# Patient Record
Sex: Female | Born: 1945 | Race: Black or African American | Hispanic: No | Marital: Married | Smoking: Never smoker
Health system: Southern US, Community
[De-identification: ages and names within clinical notes are randomized; demographics above are authoritative.]

## PROBLEM LIST (undated history)

## (undated) DIAGNOSIS — E039 Hypothyroidism, unspecified: Secondary | ICD-10-CM

## (undated) DIAGNOSIS — E7800 Pure hypercholesterolemia, unspecified: Secondary | ICD-10-CM

## (undated) DIAGNOSIS — I739 Peripheral vascular disease, unspecified: Secondary | ICD-10-CM

## (undated) DIAGNOSIS — E559 Vitamin D deficiency, unspecified: Secondary | ICD-10-CM

## (undated) DIAGNOSIS — G4733 Obstructive sleep apnea (adult) (pediatric): Secondary | ICD-10-CM

## (undated) DIAGNOSIS — E1122 Type 2 diabetes mellitus with diabetic chronic kidney disease: Secondary | ICD-10-CM

## (undated) DIAGNOSIS — I129 Hypertensive chronic kidney disease with stage 1 through stage 4 chronic kidney disease, or unspecified chronic kidney disease: Secondary | ICD-10-CM

## (undated) DIAGNOSIS — I10 Essential (primary) hypertension: Secondary | ICD-10-CM

## (undated) DIAGNOSIS — E669 Obesity, unspecified: Secondary | ICD-10-CM

## (undated) DIAGNOSIS — Z9989 Dependence on other enabling machines and devices: Secondary | ICD-10-CM

## (undated) DIAGNOSIS — N182 Chronic kidney disease, stage 2 (mild): Secondary | ICD-10-CM

## (undated) HISTORY — PX: DILATION AND CURETTAGE OF UTERUS: SHX78

## (undated) HISTORY — DX: Hypothyroidism, unspecified: E03.9

## (undated) HISTORY — DX: Obesity, unspecified: E66.9

## (undated) HISTORY — DX: Vitamin D deficiency, unspecified: E55.9

## (undated) HISTORY — DX: Hypertensive chronic kidney disease with stage 1 through stage 4 chronic kidney disease, or unspecified chronic kidney disease: I12.9

## (undated) HISTORY — DX: Dependence on other enabling machines and devices: Z99.89

## (undated) HISTORY — DX: Pure hypercholesterolemia, unspecified: E78.00

## (undated) HISTORY — DX: Obstructive sleep apnea (adult) (pediatric): G47.33

## (undated) HISTORY — DX: Essential (primary) hypertension: I10

## (undated) HISTORY — DX: Chronic kidney disease, stage 2 (mild): E11.22

## (undated) HISTORY — PX: THYROIDECTOMY: SHX17

## (undated) HISTORY — DX: Peripheral vascular disease, unspecified: I73.9

## (undated) HISTORY — PX: BREAST CYST EXCISION: SHX579

## (undated) HISTORY — PX: ABDOMINAL HYSTERECTOMY: SHX81

## (undated) HISTORY — DX: Type 2 diabetes mellitus with diabetic chronic kidney disease: N18.2

## (undated) HISTORY — PX: OTHER SURGICAL HISTORY: SHX169

---

## 1997-06-15 ENCOUNTER — Ambulatory Visit (HOSPITAL_COMMUNITY): Admission: RE | Admit: 1997-06-15 | Discharge: 1997-06-15 | Payer: Self-pay | Admitting: *Deleted

## 1997-08-11 ENCOUNTER — Ambulatory Visit (HOSPITAL_COMMUNITY): Admission: RE | Admit: 1997-08-11 | Discharge: 1997-08-11 | Payer: Self-pay | Admitting: *Deleted

## 1998-04-12 ENCOUNTER — Inpatient Hospital Stay (HOSPITAL_COMMUNITY): Admission: AD | Admit: 1998-04-12 | Discharge: 1998-04-14 | Payer: Self-pay | Admitting: Cardiovascular Disease

## 1998-04-13 ENCOUNTER — Encounter: Payer: Self-pay | Admitting: Cardiovascular Disease

## 1998-07-22 ENCOUNTER — Ambulatory Visit (HOSPITAL_COMMUNITY): Admission: RE | Admit: 1998-07-22 | Discharge: 1998-07-22 | Payer: Self-pay | Admitting: *Deleted

## 1998-09-02 ENCOUNTER — Other Ambulatory Visit: Admission: RE | Admit: 1998-09-02 | Discharge: 1998-09-02 | Payer: Self-pay | Admitting: Obstetrics and Gynecology

## 1998-09-23 ENCOUNTER — Ambulatory Visit (HOSPITAL_COMMUNITY): Admission: RE | Admit: 1998-09-23 | Discharge: 1998-09-23 | Payer: Self-pay | Admitting: Obstetrics and Gynecology

## 1998-10-17 ENCOUNTER — Encounter: Admission: RE | Admit: 1998-10-17 | Discharge: 1999-01-15 | Payer: Self-pay | Admitting: Endocrinology

## 1999-05-06 ENCOUNTER — Emergency Department (HOSPITAL_COMMUNITY): Admission: EM | Admit: 1999-05-06 | Discharge: 1999-05-06 | Payer: Self-pay | Admitting: Emergency Medicine

## 1999-05-09 ENCOUNTER — Encounter: Payer: Self-pay | Admitting: *Deleted

## 1999-05-09 ENCOUNTER — Ambulatory Visit (HOSPITAL_COMMUNITY): Admission: RE | Admit: 1999-05-09 | Discharge: 1999-05-09 | Payer: Self-pay | Admitting: *Deleted

## 1999-07-13 ENCOUNTER — Encounter: Admission: RE | Admit: 1999-07-13 | Discharge: 1999-10-11 | Payer: Self-pay | Admitting: Endocrinology

## 1999-08-11 ENCOUNTER — Ambulatory Visit (HOSPITAL_COMMUNITY): Admission: RE | Admit: 1999-08-11 | Discharge: 1999-08-12 | Payer: Self-pay | Admitting: Cardiovascular Disease

## 1999-08-11 ENCOUNTER — Encounter: Payer: Self-pay | Admitting: Cardiovascular Disease

## 1999-08-25 ENCOUNTER — Ambulatory Visit (HOSPITAL_COMMUNITY): Admission: RE | Admit: 1999-08-25 | Discharge: 1999-08-25 | Payer: Self-pay | Admitting: *Deleted

## 1999-10-20 ENCOUNTER — Encounter: Payer: Self-pay | Admitting: Obstetrics and Gynecology

## 1999-10-20 ENCOUNTER — Ambulatory Visit (HOSPITAL_COMMUNITY): Admission: RE | Admit: 1999-10-20 | Discharge: 1999-10-20 | Payer: Self-pay | Admitting: Obstetrics and Gynecology

## 1999-12-01 ENCOUNTER — Other Ambulatory Visit: Admission: RE | Admit: 1999-12-01 | Discharge: 1999-12-01 | Payer: Self-pay | Admitting: Obstetrics and Gynecology

## 2000-02-20 ENCOUNTER — Ambulatory Visit (HOSPITAL_COMMUNITY): Admission: RE | Admit: 2000-02-20 | Discharge: 2000-02-20 | Payer: Self-pay | Admitting: *Deleted

## 2000-02-20 ENCOUNTER — Encounter: Payer: Self-pay | Admitting: *Deleted

## 2000-05-21 ENCOUNTER — Inpatient Hospital Stay (HOSPITAL_COMMUNITY): Admission: EM | Admit: 2000-05-21 | Discharge: 2000-05-23 | Payer: Self-pay | Admitting: Emergency Medicine

## 2000-05-23 ENCOUNTER — Encounter: Payer: Self-pay | Admitting: Cardiovascular Disease

## 2000-07-05 ENCOUNTER — Ambulatory Visit (HOSPITAL_COMMUNITY): Admission: RE | Admit: 2000-07-05 | Discharge: 2000-07-05 | Payer: Self-pay | Admitting: *Deleted

## 2000-11-14 ENCOUNTER — Encounter: Payer: Self-pay | Admitting: Obstetrics and Gynecology

## 2000-11-14 ENCOUNTER — Ambulatory Visit (HOSPITAL_COMMUNITY): Admission: RE | Admit: 2000-11-14 | Discharge: 2000-11-14 | Payer: Self-pay | Admitting: Obstetrics and Gynecology

## 2001-01-08 ENCOUNTER — Ambulatory Visit (HOSPITAL_COMMUNITY): Admission: RE | Admit: 2001-01-08 | Discharge: 2001-01-08 | Payer: Self-pay | Admitting: Cardiovascular Disease

## 2001-01-08 ENCOUNTER — Encounter: Payer: Self-pay | Admitting: Cardiovascular Disease

## 2001-01-21 ENCOUNTER — Encounter: Admission: RE | Admit: 2001-01-21 | Discharge: 2001-01-21 | Payer: Self-pay | Admitting: Cardiovascular Disease

## 2001-01-21 ENCOUNTER — Encounter: Payer: Self-pay | Admitting: Cardiovascular Disease

## 2001-05-13 ENCOUNTER — Encounter: Payer: Self-pay | Admitting: *Deleted

## 2001-05-13 ENCOUNTER — Ambulatory Visit (HOSPITAL_COMMUNITY): Admission: RE | Admit: 2001-05-13 | Discharge: 2001-05-13 | Payer: Self-pay | Admitting: *Deleted

## 2001-05-26 ENCOUNTER — Encounter: Payer: Self-pay | Admitting: Cardiovascular Disease

## 2001-05-26 ENCOUNTER — Ambulatory Visit (HOSPITAL_COMMUNITY): Admission: RE | Admit: 2001-05-26 | Discharge: 2001-05-26 | Payer: Self-pay | Admitting: Cardiovascular Disease

## 2001-06-04 ENCOUNTER — Encounter: Payer: Self-pay | Admitting: *Deleted

## 2001-06-04 ENCOUNTER — Ambulatory Visit (HOSPITAL_COMMUNITY): Admission: RE | Admit: 2001-06-04 | Discharge: 2001-06-04 | Payer: Self-pay | Admitting: *Deleted

## 2001-06-19 ENCOUNTER — Encounter: Admission: RE | Admit: 2001-06-19 | Discharge: 2001-07-24 | Payer: Self-pay | Admitting: Neurosurgery

## 2001-10-10 ENCOUNTER — Encounter: Admission: RE | Admit: 2001-10-10 | Discharge: 2001-10-10 | Payer: Self-pay | Admitting: Endocrinology

## 2001-10-10 ENCOUNTER — Encounter: Payer: Self-pay | Admitting: Endocrinology

## 2001-10-27 ENCOUNTER — Ambulatory Visit (HOSPITAL_COMMUNITY): Admission: RE | Admit: 2001-10-27 | Discharge: 2001-10-27 | Payer: Self-pay | Admitting: *Deleted

## 2001-10-27 ENCOUNTER — Encounter: Payer: Self-pay | Admitting: *Deleted

## 2001-12-05 ENCOUNTER — Ambulatory Visit (HOSPITAL_COMMUNITY): Admission: RE | Admit: 2001-12-05 | Discharge: 2001-12-05 | Payer: Self-pay | Admitting: Obstetrics and Gynecology

## 2001-12-05 ENCOUNTER — Encounter: Payer: Self-pay | Admitting: Obstetrics and Gynecology

## 2001-12-18 ENCOUNTER — Ambulatory Visit (HOSPITAL_COMMUNITY): Admission: RE | Admit: 2001-12-18 | Discharge: 2001-12-18 | Payer: Self-pay | Admitting: *Deleted

## 2001-12-18 ENCOUNTER — Encounter: Payer: Self-pay | Admitting: *Deleted

## 2002-05-29 ENCOUNTER — Inpatient Hospital Stay (HOSPITAL_COMMUNITY): Admission: EM | Admit: 2002-05-29 | Discharge: 2002-06-02 | Payer: Self-pay | Admitting: Emergency Medicine

## 2002-05-29 ENCOUNTER — Encounter: Payer: Self-pay | Admitting: Emergency Medicine

## 2002-05-29 ENCOUNTER — Encounter: Payer: Self-pay | Admitting: Orthopedic Surgery

## 2002-05-30 ENCOUNTER — Encounter: Payer: Self-pay | Admitting: Orthopedic Surgery

## 2002-08-14 ENCOUNTER — Encounter: Payer: Self-pay | Admitting: Orthopedic Surgery

## 2002-08-19 ENCOUNTER — Ambulatory Visit (HOSPITAL_COMMUNITY): Admission: RE | Admit: 2002-08-19 | Discharge: 2002-08-21 | Payer: Self-pay | Admitting: Orthopedic Surgery

## 2002-09-14 ENCOUNTER — Encounter: Admission: RE | Admit: 2002-09-14 | Discharge: 2002-12-13 | Payer: Self-pay | Admitting: Orthopedic Surgery

## 2002-10-14 ENCOUNTER — Encounter: Admission: RE | Admit: 2002-10-14 | Discharge: 2002-12-17 | Payer: Self-pay | Admitting: Orthopedic Surgery

## 2002-12-07 ENCOUNTER — Ambulatory Visit (HOSPITAL_COMMUNITY): Admission: RE | Admit: 2002-12-07 | Discharge: 2002-12-07 | Payer: Self-pay | Admitting: *Deleted

## 2002-12-07 ENCOUNTER — Encounter: Payer: Self-pay | Admitting: *Deleted

## 2003-05-13 ENCOUNTER — Encounter: Admission: RE | Admit: 2003-05-13 | Discharge: 2003-07-15 | Payer: Self-pay | Admitting: Orthopedic Surgery

## 2003-08-18 ENCOUNTER — Ambulatory Visit (HOSPITAL_COMMUNITY): Admission: RE | Admit: 2003-08-18 | Discharge: 2003-08-18 | Payer: Self-pay | Admitting: *Deleted

## 2003-09-08 ENCOUNTER — Ambulatory Visit (HOSPITAL_COMMUNITY): Admission: RE | Admit: 2003-09-08 | Discharge: 2003-09-08 | Payer: Self-pay | Admitting: *Deleted

## 2003-10-08 ENCOUNTER — Ambulatory Visit (HOSPITAL_COMMUNITY): Admission: RE | Admit: 2003-10-08 | Discharge: 2003-10-08 | Payer: Self-pay | Admitting: Cardiovascular Disease

## 2003-12-30 ENCOUNTER — Ambulatory Visit (HOSPITAL_COMMUNITY): Admission: RE | Admit: 2003-12-30 | Discharge: 2003-12-30 | Payer: Self-pay | Admitting: Internal Medicine

## 2004-01-01 ENCOUNTER — Inpatient Hospital Stay (HOSPITAL_COMMUNITY): Admission: EM | Admit: 2004-01-01 | Discharge: 2004-01-03 | Payer: Self-pay | Admitting: Emergency Medicine

## 2004-11-15 ENCOUNTER — Emergency Department (HOSPITAL_COMMUNITY): Admission: EM | Admit: 2004-11-15 | Discharge: 2004-11-15 | Payer: Self-pay | Admitting: *Deleted

## 2005-01-31 ENCOUNTER — Ambulatory Visit (HOSPITAL_COMMUNITY): Admission: RE | Admit: 2005-01-31 | Discharge: 2005-01-31 | Payer: Self-pay | Admitting: Internal Medicine

## 2005-03-15 ENCOUNTER — Observation Stay (HOSPITAL_COMMUNITY): Admission: AD | Admit: 2005-03-15 | Discharge: 2005-03-16 | Payer: Self-pay | Admitting: Cardiovascular Disease

## 2005-09-03 ENCOUNTER — Encounter: Admission: RE | Admit: 2005-09-03 | Discharge: 2005-09-03 | Payer: Self-pay | Admitting: Internal Medicine

## 2005-09-07 ENCOUNTER — Encounter: Admission: RE | Admit: 2005-09-07 | Discharge: 2005-09-07 | Payer: Self-pay | Admitting: Hematology and Oncology

## 2005-10-29 ENCOUNTER — Encounter: Admission: RE | Admit: 2005-10-29 | Discharge: 2005-10-29 | Payer: Self-pay | Admitting: Cardiovascular Disease

## 2005-11-29 ENCOUNTER — Ambulatory Visit (HOSPITAL_COMMUNITY): Admission: RE | Admit: 2005-11-29 | Discharge: 2005-11-29 | Payer: Self-pay | Admitting: Internal Medicine

## 2006-01-01 ENCOUNTER — Other Ambulatory Visit: Admission: RE | Admit: 2006-01-01 | Discharge: 2006-01-01 | Payer: Self-pay | Admitting: Obstetrics and Gynecology

## 2006-09-17 ENCOUNTER — Inpatient Hospital Stay (HOSPITAL_COMMUNITY): Admission: AD | Admit: 2006-09-17 | Discharge: 2006-09-19 | Payer: Self-pay | Admitting: Cardiovascular Disease

## 2006-10-21 ENCOUNTER — Encounter: Admission: RE | Admit: 2006-10-21 | Discharge: 2006-10-21 | Payer: Self-pay | Admitting: Cardiovascular Disease

## 2006-11-24 ENCOUNTER — Encounter: Admission: RE | Admit: 2006-11-24 | Discharge: 2006-11-24 | Payer: Self-pay | Admitting: Cardiovascular Disease

## 2006-12-17 ENCOUNTER — Ambulatory Visit (HOSPITAL_COMMUNITY): Admission: RE | Admit: 2006-12-17 | Discharge: 2006-12-17 | Payer: Self-pay | Admitting: Neurosurgery

## 2007-01-08 ENCOUNTER — Ambulatory Visit (HOSPITAL_COMMUNITY): Admission: RE | Admit: 2007-01-08 | Discharge: 2007-01-08 | Payer: Self-pay | Admitting: Obstetrics and Gynecology

## 2007-02-06 ENCOUNTER — Emergency Department (HOSPITAL_COMMUNITY): Admission: EM | Admit: 2007-02-06 | Discharge: 2007-02-06 | Payer: Self-pay | Admitting: Emergency Medicine

## 2007-12-23 ENCOUNTER — Ambulatory Visit (HOSPITAL_COMMUNITY): Admission: RE | Admit: 2007-12-23 | Discharge: 2007-12-23 | Payer: Self-pay | Admitting: Anesthesiology

## 2008-01-19 ENCOUNTER — Encounter: Admission: RE | Admit: 2008-01-19 | Discharge: 2008-01-19 | Payer: Self-pay | Admitting: Neurosurgery

## 2008-04-22 ENCOUNTER — Encounter: Admission: RE | Admit: 2008-04-22 | Discharge: 2008-04-22 | Payer: Self-pay | Admitting: Orthopedic Surgery

## 2008-05-11 ENCOUNTER — Encounter: Admission: RE | Admit: 2008-05-11 | Discharge: 2008-05-11 | Payer: Self-pay | Admitting: Orthopedic Surgery

## 2008-06-03 ENCOUNTER — Ambulatory Visit (HOSPITAL_COMMUNITY): Admission: RE | Admit: 2008-06-03 | Discharge: 2008-06-04 | Payer: Self-pay | Admitting: Orthopedic Surgery

## 2008-07-01 ENCOUNTER — Encounter: Admission: RE | Admit: 2008-07-01 | Discharge: 2008-08-25 | Payer: Self-pay | Admitting: Orthopedic Surgery

## 2008-12-02 ENCOUNTER — Encounter: Admission: RE | Admit: 2008-12-02 | Discharge: 2008-12-02 | Payer: Self-pay | Admitting: Anesthesiology

## 2009-01-13 ENCOUNTER — Encounter: Admission: RE | Admit: 2009-01-13 | Discharge: 2009-01-13 | Payer: Self-pay | Admitting: Orthopedic Surgery

## 2009-10-03 ENCOUNTER — Ambulatory Visit (HOSPITAL_COMMUNITY): Admission: RE | Admit: 2009-10-03 | Discharge: 2009-10-03 | Payer: Self-pay | Admitting: Internal Medicine

## 2010-04-16 ENCOUNTER — Encounter: Payer: Self-pay | Admitting: Internal Medicine

## 2010-04-16 ENCOUNTER — Encounter: Payer: Self-pay | Admitting: Neurosurgery

## 2010-07-06 LAB — COMPREHENSIVE METABOLIC PANEL
ALT: 36 U/L — ABNORMAL HIGH (ref 0–35)
AST: 46 U/L — ABNORMAL HIGH (ref 0–37)
Calcium: 9.9 mg/dL (ref 8.4–10.5)
Creatinine, Ser: 0.78 mg/dL (ref 0.4–1.2)
GFR calc Af Amer: >60 mL/min (ref >60)
Sodium: 143 mEq/L (ref 135–145)
Total Protein: 6.3 g/dL (ref 6.0–8.3)

## 2010-07-06 LAB — URINE MICROSCOPIC-ADD ON

## 2010-07-06 LAB — GLUCOSE, CAPILLARY
Glucose-Capillary: 140 mg/dL — ABNORMAL HIGH (ref 70–99)
Glucose-Capillary: 158 mg/dL — ABNORMAL HIGH (ref 70–99)
Glucose-Capillary: 247 mg/dL — ABNORMAL HIGH (ref 70–99)

## 2010-07-06 LAB — CBC
MCHC: 34.5 g/dL (ref 30.0–36.0)
Platelets: 253 10*3/uL (ref 150–400)
RDW: 14.2 % (ref 11.5–15.5)

## 2010-07-06 LAB — HEMOGLOBIN A1C
Hgb A1c MFr Bld: 6.3 % — ABNORMAL HIGH (ref 4.6–6.1)
Mean Plasma Glucose: 134 mg/dL

## 2010-07-06 LAB — URINALYSIS, ROUTINE W REFLEX MICROSCOPIC
Glucose, UA: NEGATIVE mg/dL
Ketones, ur: NEGATIVE mg/dL
Specific Gravity, Urine: 1.026 (ref 1.005–1.030)
pH: 6.5 (ref 5.0–8.0)

## 2010-08-08 NOTE — Op Note (Signed)
NAMEKRISTAIN, [name]                ACCOUNT NO.:  [id number]   MEDICAL RECORD NO.:  [id number]          PATIENT TYPE:  OIB   LOCATION:  5005                         FACILITY:  [facility]   PHYSICIAN:  Myrtie Neither, MD      DATE OF BIRTH:  [birth year]   DATE OF PROCEDURE:  06/03/2008  DATE OF DISCHARGE:                               OPERATIVE REPORT   PREOPERATIVE DIAGNOSES:  1. Impingement syndrome, left shoulder.  2. Possible cuff rotator cuff tear.   POSTOPERATIVE DIAGNOSIS:  Impingement syndrome tendopathy, left rotator  cuff.   ANESTHESIA:  General.   PROCEDURE:  Arthroscopic acromioplasty and complete synovectomy, left  shoulder.  The patient was taken to the operating room.  After giving  adequate medications, given general anesthesia, and intubated.  Left  shoulder was prepped with DuraPrep and draped in sterile manner.  The  patient was placed in barber chair position.  An one-half inch puncture  wound made along the posterior aspect of left shoulder going through  skin and subcutaneous tissue.  Suture wire was placed from posterior to  anterior, anterior incision was then made for inflow of water.  Separate  lateral incision was made for the shaver.  Inspection of the joint  revealed hypertrophic overgrowth of subacromial bursal sac,  chondromalacia changes of the subacromial surface.  Sharp dip in the  anterior lateral aspect of the acromion.  Inspection of the cuff after  complete synovectomy was done, demonstrate no complete tear with  tendopathy involving the rotator cuff.  Acromioplasty was then done  after adequate acromioplasty.  Further inspection did not reveal any  other further pathology.  Copious and abundant irrigation was done.  Wound closure was then done with 4-0 nylon.  Compressive dressing was  applied.  Immobilizing sling was applied.  The patient tolerated  procedure quite well, went to the recovery room in stable and  satisfactory condition.      Myrtie Neither, MD  Electronically Signed     AC/MEDQ  D:  06/03/2008  T:  06/03/2008  Job:  [id number]

## 2010-08-08 NOTE — H&P (Signed)
[name], [name]                ACCOUNT NO.:  [id number]   MEDICAL RECORD NO.:  [id number]          PATIENT TYPE:  INP   LOCATION:  4739                         FACILITY:  [facility]   PHYSICIAN:  Ricki Rodriguez, M.D.  DATE OF BIRTH:  Apr 24, 1945   DATE OF ADMISSION:  09/17/2006  DATE OF DISCHARGE:                              HISTORY & PHYSICAL   CHIEF COMPLAINT:  Chest pain.   HISTORY OF PRESENT ILLNESS:  A 65 year old black female described  retrosternal pressure type chest pain radiating to her left arm which  felt as heaviness.  The patient had a classmate who died of sudden heart  attack 1 week ago.   PAST MEDICAL HISTORY:  1. Positive for diabetes for 13 years.  2. Hypertension for 15 years.  3. Hyperlipidemia.  4. Obesity.  5. Known history of coronary artery disease.  6. Negative history of regular exercise.   The patient denies history of smoking, alcohol intake, or drug intake.   PAST SURGICAL HISTORY:  1. Thyroid surgery 1979.  2. Right breast benign cyst removal 1980.  3. Hysterectomy and appendectomy 1988.  4. Right shoulder and ankle surgery 2004.  5. Cardiac catheterization 2005 showing minimal coronary artery      disease.   MEDICATIONS:  1. Prevacid 30 mg one daily.  2. Detrol LA 4 mg daily.  3. Eskalith 450 mg one daily.  4. Seroquel 100 mg one at bedtime.  5. Enteric-coated aspirin 325 mg daily.  6. Synthroid 0.15 mg daily.  7. Diovan 320 mg daily.  8. Lantus 20 units at bedtime.  9. Zocor 40 mg one daily.  10.Imdur 60 mg one daily.  11.KCl 10 mEq daily.  12.Ferrous sulfate 325 mg one daily.  13.Gabapentin 200 mg three times daily.  14.Lasix 20 mg one daily.  15.Nitroglycerin 0.4 mg tablet one sublingual every 5 minutes x3 as      needed for chest pain.  16.Calcium 500 mg one daily.  17.Tramadol 50 mg one three times daily.  18.XALATAN eye drops 1 drop each eye at bedtime.   ALLERGIES:  1. CRESTOR.  2. TOPROL.  3. LYRICA.   SOCIAL  HISTORY:  The patient is married.  Husband, [name], is 19 years  old.  The patient has 3 daughters and 1 adopted son.   FAMILY HISTORY:  Father died of brain problems in his 33s.  The patient  has 2 brothers and 2 sisters and they are living and well.   REVIEW OF SYSTEMS:  The patient denies recent weight gain.  Wears  glasses.  No dentures.  No history of asthma.  Positive history of chest  pain.  Positive history of dizziness.  No palpitations.  No history of  leg edema.  Positive history of hiatal hernia.  Negative history of GI  bleed, blood transfusion, hepatitis, kidney stones, strokes, seizures.  Positive history of psychiatric admissions for manic depression.  History of pneumonia shot 2004.  Flu shot 8 months ago.   PHYSICAL EXAMINATION:  VITAL SIGNS:  Pulse 50, respirations 18, blood  pressure 140/60, height 5 feet 4  inches, weight approximately 216  pounds.  GENERAL:  The patient is alert and oriented x3.  HEENT:  The patient is normocephalic, atraumatic.  Has brown eyes.  Conjunctivae pink.  Sclerae white.  NECK:  No JVD.  No carotid bruits.  LUNGS:  Clear bilaterally.  CHEST WALL:  Nontender on palpation.  HEART:  Normal S1, S2 with grade 3/6 systolic murmur in her left sternal  border.  ABDOMEN:  Soft, distended.  EXTREMITIES:  Trace edema.  CNS:  Cranial nerves grossly intact.  The patient as bilateral equal  grips.   LABORATORY DATA:  Pending.   EKG:  Sinus bradycardia.   IMPRESSION:  1. Chest pain.  Rule out myocardial infarction.  2. Coronary artery disease.  3. Hypertension.  4. Hypothyroidism.  5. Diabetes mellitus type 2.   PLAN:  Plan is to admit patient on telemetry floor.  Continue home  medications.  Get serial cardiac enzymes to rule out MI.  Consider  nuclear stress test followed by cardiac catheterization if needed.      Ricki Rodriguez, M.D.  Electronically Signed     ASK/MEDQ  D:  09/17/2006  T:  09/18/2006  Job:  [id number]

## 2010-08-08 NOTE — Discharge Summary (Signed)
[name], [name]                ACCOUNT NO.:  [id number]   MEDICAL RECORD NO.:  [id number]          PATIENT TYPE:  INP   LOCATION:  4739                         FACILITY:  [facility]   PHYSICIAN:  Ricki Rodriguez, M.D.  DATE OF BIRTH:  1945/08/19   DATE OF ADMISSION:  09/17/2006  DATE OF DISCHARGE:  09/19/2006                               DISCHARGE SUMMARY   FINAL DIAGNOSES:  1. Chest pain.  2. Diabetes mellitus type 2.  3. Hypertension.  4. Hyperlipidemia.  5. Obesity.  6. Coronary atherosclerosis of native coronary vessel.  7. Diaphragmatic hernia.  8. Hypothyroidism.   DISCHARGE MEDICATIONS:  1. Prevacid 30 mg one daily.  2. Detrol LA 4 mg one daily.  3. Eskalith 450 mg one daily.  4. Seroquel 100 mg one at bedtime.  5. Synthroid 0.15 mg one daily.  6. Diovan 320 mg one daily.  7. Enteric-coated aspirin 325 mg one daily.  8. Lantus 20 units at bedtime.  9. Zocor 40 mg one daily.  10.Imdur 60 mg one daily.  11.KCl 10 mEq daily.  12.Ferrous sulfate 25 mg one daily.  13.Gabapentin 200 mg one three times daily.  14.Lasix 20 mg one daily.  15.Tylenol 15 mg one three times daily.  16.Nitroglycerin 0.4 mg one tablet one sublingual every 5 minutes x3      as needed for chest pain.  17.Calcium 500 mg one or two daily.  18.Xalatan eye drops 1 drop each eye at bedtime.   DISCHARGE DIET:  Low sodium, heart healthy, medium calorie, carbohydrate-  modified diet.   DISCHARGE ACTIVITY:  The patient to increase activity slowly.   SPECIAL INSTRUCTIONS:  The patient to stop any activity that causes  chest pain, shortness of breath, dizziness, sweating or excessive  weakness.   FOLLOWUP:  Follow-up by Dr. Orpah Cobb in two weeks.  The patient to  call [phone] for appointment.   HISTORY OF PRESENT ILLNESS:  This 65 year old black female presented  with sudden onset of chest pain radiating to the left arm with cardiac  risk factors of diabetes for 13 years, hypertension for 15  years,  hyperlipidemia.   PHYSICAL EXAMINATION:  VITAL SIGNS:  Pulse 50, respirations 80, blood  pressure 140/60,  height 5 feet 4 inches, weight approximately 216  pounds.  GENERAL:  Patient is alert, oriented x3, and in no significant distress.  HEENT: The patient is normocephalic, atraumatic.  Has brown eyes.  Conjunctivae pink.  Sclerae are white.  NECK:  No JVD, no carotid bruit.  LUNGS:  Clear bilaterally.  CHEST:  Nontender on palpation.  HEART:  Normal S1, S2 with grade 3/6 systolic murmur at the left sternal  border.  ABDOMEN:  Soft, nondistended.  EXTREMITIES:  With trace edema.  CNS:  Cranial nerves grossly intact.  Patient has bilateral equal grips.   LABORATORY DATA:  Revealed normal hemoglobin/hematocrit, WBC count,  platelet count.  Normal PT/INR, normal electrolytes, BUN, creatinine,  sugar borderline at 104.  Liver enzymes near normal with borderline AST  40.  Cardiac enzymes and troponin I normal.  Nuclear stress test showed  ejection fraction 72% and normal wall motion without reversible  ischemia.  Chest x-ray:  Cardiomegaly and mild vascular congestion.  EKG  revealed sinus bradycardia.   HOSPITAL COURSE:  The patient was admitted to telemetry unit.  Myocardial infarction was ruled out.  She underwent nuclear stress test  that failed to show any reversible ischemia.  The patient's medications  were adjusted, and she was discharged home in satisfactory condition  with a follow-up by me in two weeks.      Ricki Rodriguez, M.D.  Electronically Signed     ASK/MEDQ  D:  01/01/2007  T:  01/02/2007  Job:  [id number]

## 2010-08-11 NOTE — Cardiovascular Report (Signed)
[place]  Patient:    [name], [name]                       MRN: [id number] Proc. Date: 08/11/99 Adm. Date:  [id number] Disc. Date: [id number] Attending:  Ricki Rodriguez CC:         Sharyn Dross., M.D.                        Cardiac Catheterization  CINE NO. [id number]  REFERRING PHYSICIAN:  Sharyn Dross., M.D.  PROCEDURE DONE BY:  Ricki Rodriguez, M.D.  PROCEDURES: 1. Left heart catheterization. 2. Selective coronary angiography. 3. Left ventricular function study. 4. Percutaneous transluminal coronary angioplasty and stent placement in    obtuse marginal branch #2 off left circumflex coronary artery.  INDICATIONS:  This 65 year old black female with symptoms of angina and abnormal stress test has a known history of coronary artery disease.  Todays cardiac catheterization showed a 70% obtuse marginal branch #2 lesion with somewhat unstable or eccentric plaque.  COMPLICATIONS:  None.  HEMODYNAMIC DATA:  The left ventricular pressure was 157/23.  The aortic pressure was 157/74.  ANGIOGRAPHIC DATA:  Left ventriculogram:  The left ventriculogram showed normal left ventricular systolic function with ejection fraction of 70%.  Coronary anatomy: 1. The left main coronary artery was unremarkable. 2. The left anterior descending artery coronary artery had an ostial 10%    stenosis.  The rest of the vessel was unremarkable. 3. The left circumflex coronary artery had minimal proximal luminal    irregularity and mid vessel 20% stenosis.  Its obtuse marginal branch #1    was very small and obtuse marginal branch #2 was a large vessel with a 70%    proximal lesion in the superior branch. 4. The right coronary artery was a small vessel.  PTCA STENT PLACEMENT IN OBTUSE MARGINAL BRANCH #2 OF LEFT CIRCUMFLEX CORONARY ARTERY:  The PTCA stent placement was carried out using a JL4 6-French guide, a 190-cm Hi-Torque Floppy wire, a 3.0 x 8.0-mm  Tetra stent deployed at 18 atmospheres for 25 seconds with a 70% lesion reduced to 0% and TIMI-3 flow.  IMPRESSION: 1. Significant obtuse marginal branch #2/left circumflex coronary artery    lesion. 2. Normal left ventricular systolic function. 3. Successful stent placement in the left circumflex coronary artery obtuse    marginal branch.  RECOMMENDATIONS:  This patient will have post PTCA care in the angioplasty unit and will receive IV ReoPro.DD:  08/11/99 TD:  08/15/99 Job: [id number]

## 2010-08-11 NOTE — Op Note (Signed)
[name], [name]                            ACCOUNT NO.:  [id number]   MEDICAL RECORD NO.:  [id number]                   PATIENT TYPE:  OIB   LOCATION:  2550                                 FACILITY:  [facility]   PHYSICIAN:  Myrtie Neither, M.D.                 DATE OF BIRTH:  05/31/1945   DATE OF PROCEDURE:  08/19/2002  DATE OF DISCHARGE:                                 OPERATIVE REPORT   PREOPERATIVE DIAGNOSIS:  Rotator cuff tear, right shoulder with impingement  of right shoulder.   POSTOPERATIVE DIAGNOSIS:  Rotator cuff tear, right shoulder with impingement  of right shoulder.   OPERATION PERFORMED:  Right rotator cuff repair.   SURGEON:  Myrtie Neither, M.D.   ANESTHESIA:  General.   DESCRIPTION OF PROCEDURE:  The patient was taken to the operating room after  being adequate preop medications and given general anesthesia intubated.  The patient was previously given scalene block.  The patient was placed in a  barber chair position.  The right shoulder was prepped and draped with  DuraPrep and draped in sterile manner.  The shoulder was prepped and  incision made over the right shoulder going through the skin down to  subcutaneous tissue, down to the deltoid.  The deltoid was reflected off the  acromion.  Hypertrophic subacromial bursal sac was identified and was  resected.  Resection of the synovium was done.  Rotator cuff tear was  identified, full thickness.  The tendon itself had retracted only  approximately an inch away from the detachment and a trough was made about  the greater tuberosity.  Drill holes placed down through the trough.  Sutures were placed into the rotator cuff and the cuff tendon itself was  pulled back down into the bony trough with good bleeding bony surface.  This  was sutured down with very good approximation and good closure.  Copious  irrigation with antibiotic solution was done.  Several other interrupted  sutures were also used.  Acromioplasty  was done with the use of the  oscillating saw and rasp.  This was done prior to repair of the rotator  cuff.  Copious and abundant irrigation was done.  Wound closure was then  done with 0 Vicryl for the deltoid, 2-0 for the subcutaneous and skin  staples for the skin.  The patient tolerated the procedure well and went to  the recovery room in stable and satisfactory condition with the arm in 90  degree abduction position.  The patient will be placed in abduction shoulder  brace in the recovery room.  The patient tolerated the procedure quite well  and went to recovery room in stable and satisfactory condition.  Myrtie Neither, M.D.    AC/MEDQ  D:  08/19/2002  T:  08/19/2002  Job:  [id number]

## 2010-08-11 NOTE — H&P (Signed)
NAMETAUNJA, BRICKNER NO.:  [id number]   MEDICAL RECORD NO.:  [id number]                   PATIENT TYPE:  EMS   LOCATION:  ED                                   [facility]ACILITY[facility]   PHYSICIAN:  Myrtie Neither, M.D.                 DATE OF BIRTH:  23-Jul-1945   DATE OF ADMISSION:  05/29/2002  DATE OF DISCHARGE:                                HISTORY & PHYSICAL   CHIEF COMPLAINT:  Painful deformed right ankle.   HISTORY OF PRESENT ILLNESS:  This is a 65 year old female who was at work  today and sustained a fall at work sustaining an injury to her right ankle  with gross deformity.  The patient was brought to [facility] Emergency Room  for treatment.  The patient denied any loss of consciousness or striking her  head or any other injury.  The patient denies any other injuries from the  fall.   PAST MEDICAL HISTORY:  1. Diabetes mellitus.  2. High blood pressure.  3. Hysterectomy.  4. Thyroidectomy.  5. Abscess drained of the right breast.  6. Hyperlipidemia.   FAMILY HISTORY:  Negative for high blood pressure or diabetes mellitus.   SOCIAL HISTORY:  The patient lives with her husband.  Has three girls.  The  patient has four children.  The patient denies any use of alcohol or illegal  drugs.  Does not smoke.   REVIEW OF SYSTEMS:  Some shortness of breath recently.  No chest pain.  The  shortness of breath is on exertion, short-distance walking.  The patient  also has history of right shoulder and radicular pain down to the right  hand.  No urinary or bowel symptoms.   FAMILY DOCTOR:  Dr. Dortha Kern.   PHYSICAL EXAMINATION:  GENERAL:  Alert and oriented.  In no acute distress.  VITAL SIGNS:  Blood pressure 129/72, pulse 67, respirations 18, temperature  98.7.  HEENT:  Head normocephalic ___________ sclerae clear.  NECK:  Supple.  CHEST:  Clear.  Good respiratory excursion.  LUNGS:  Clear.  CARDIAC:  S1, S2 regular.  ABDOMEN:  Soft. Active  bowel sounds.  EXTREMITIES:  Right ankle tender, swollen.  Nail beds pink and blanched.  Pulses intact.  Right shoulder tender anteriorly and laterally.  Some  tenderness subacromial space.  Tender right paracervical muscles and  trapezius muscle.  Good grip and pinch bilaterally.  Positive Tinel's sign  on the right side.   LABORATORY DATA:  X-ray revealed fracture dislocation of the right ankle.  The patient had closed reduction of the dislocation in the emergency room  which reveals good reduction of the dislocation and bimalleolar fracture.   IMPRESSION:  1. Fracture dislocation right ankle, malleolar fracture.  2. History of high blood pressure.  3. History of diabetes mellitus.  4. History of hypercholesterolemia.   PLAN:  Medical evaluation to be  done by Dr. Dortha Kern.  He was called  tonight and spoken with to see the patient tonight.  The patient will be  scheduled for open reduction and internal fixation of right ankle.                                               Myrtie Neither, M.D.    AC/MEDQ  D:  05/29/2002  T:  05/29/2002  Job:  [id number]

## 2010-08-11 NOTE — Procedures (Signed)
[place]  Patient:    [name], [name]                       MRN: [id number] Proc. Date: 08/25/99 Adm. Date:  [id number] Disc. Date: [id number] Attending:  Sharyn Dross Dictator:   Sharyn Dross., M.D.                           Procedure Report  REFERRING PHYSICIAN:  Myself.  PREOPERATIVE DIAGNOSIS:  History of colon polyps.  POSTOPERATIVE DIAGNOSIS:  Normal colonoscopic examination to the cecal region.  PROCEDURE:  Colonoscopy.  MEDICATIONS: 1. Demerol 70 mg intravenously over a 10 minute period of time. 2. Versed 6 mg intravenously over a 10 minute period of time.  INSTRUMENT:  Olympus video pan colonoscope.  ENDOSCOPIST:  Dr. Tad Moore.  INDICATIONS:  This pleasant female returns back for an evaluation for colon polyps.  She has been symptom-free without any return or discomforts.  She came in for the re-evaluation because of the character of the polyp at the initial removal.  The polyps were thought to be adenomatous in character at this time.  PHYSICAL EXAMINATION:  GENERAL:  She is a very pleasant female, appears to be in no acute distress.  VITAL SIGNS:  Stable.  HEENT:  Anicteric.  NECK:  Supple.  LUNGS:  Clear.  HEART:  Regular rate and rhythm without heaves, murmurs, rubs, or gallops.  ABDOMEN:  Soft, no tenderness, no hepatosplenomegaly.  EXTREMITIES:  Within normal limits.  PLAN:  We are going to proceed with the colonoscopic examination.  INFORMED CONSENT:  The patient was advised of the procedure, indications, and the risks involved.  The patient has agreed to have the procedure performed.  PREOPERATIVE PREPARATION:  The patient was brought to the endoscopy unit on IV where IV sedative medication was started.  Monitors were placed on the patient to monitor the patients vital signs and oxygen saturation.  Nasal oxygen at 2 L per minute was used, and after adequate sedation was performed, the procedure was  begun.  BOWEL PREP:  The patient was given GoLYTELY and Reglan as a bowel prep.  The patient tolerated the prep well without any complications.  The quality of the prep was excellent.  DESCRIPTION OF PROCEDURE:  The instrument was advanced with the patient laying in the left lateral position approximately 85 cm to the proximal colon, to the proximal ascending colon area.  There appeared to be no gross abnormalities such as masses, polyps, or stricture lesions appreciated.  The vascular pattern appeared to be well within normal limits throughout the entire colon. The mucosal pattern showed no evidence of any granulated changes nor diverticular changes at this time.  As the instrument was advanced more proximal to the region, there appeared to be no further advancement the instrument was able to go at this time. Photographs were taken of the region, however, there was no visualization of an area consistent with appendiceal orifice or ileocecal valve noted.  Because there was no further luminal advancement the instrument could make, the instrument was gradually retracted back at this time.  There was again no evidence of any abnormalities noted, nor any increased tortuosity appreciated at this time.  The instrument was removed per the rectum without difficulty with the patient tolerating the procedure well.  TREATMENT:  Conservative management.  Would recommend repeating the procedure in approximately two years at this time.  LEVEL OF DIFFICULTY:  Grade 1/5.  PRESENT RECOMMENDATIONS:  Would recommend using the same instrument at this time. DD:  08/25/99 TD:  08/29/99 Job: [id number]

## 2010-08-11 NOTE — Op Note (Signed)
   [name], [name]                            ACCOUNT NO.:  [id number]   MEDICAL RECORD NO.:  [id number]                   PATIENT TYPE:  INP   LOCATION:  0457                                 FACILITY:  [facility]   PHYSICIAN:  Myrtie Neither, M.D.                 DATE OF BIRTH:  [birth year]   DATE OF PROCEDURE:  05/30/2002  DATE OF DISCHARGE:                                 OPERATIVE REPORT   PREOPERATIVE DIAGNOSES:  Fracture dislocation of right ankle with fractures  of the lateral malleolus and tear of the deltoid ligament.   POSTOPERATIVE DIAGNOSES:  Fracture dislocation of right ankle with fractures  of the lateral malleolus and tear of the deltoid ligament.   ANESTHESIA:  General.   PROCEDURE:  1. Open reduction and internal fixation lateral malleolus.  2. Repair of deltoid ligament.   DESCRIPTION OF PROCEDURE:  The patient was taken to the operating room after  given adequate preop medication given general anesthesia and intubated. The  right lower leg was prepped with Duraprep and draped in a sterile manner. A  tourniquet and Bovie used for hemostasis. A curvilinear incision was made  over the medial malleolus going through skin and subcutaneous down to the  deltoid ligament. The deltoid ligament was completely disrupted, drill holes  were placed in the medial malleolus, sutures were then placed through the  deltoid ligament reapproximating it through the drill holes. The sutures  were put in place and clamped off. Next, attention was turned to the lateral  malleolus. A lateral incision made over the lateral malleolus going through  the skin and subcutaneous tissue. Subperiosteal elevation was done to the  fracture site. C-arm was used for fracture reduction. A bone clamp was used  to hold the lateral malleolus in anatomic reduction. A seven come  compression plate was then applied. Six screws were placed in the seven hole  plate. This demonstrated good stability and anatomic  reduction. This was  visualized using C-arm. Next attention was then turned back to the deltoid,  the ankle reduced and the sutures were then tied down with good repair of  the deltoid ligament. Copious irrigation of both wounds were done with  antibiotic solution. 2-0 Vicryl was used in the subcutaneous and skin  staples for the skin. A compressive dressing was applied followed by a short  length fiberglass cast. The patient tolerated the procedure quite well and  went to the recovery room in stable and satisfactory condition.                                               Myrtie Neither, M.D.    AC/MEDQ  D:  05/30/2002  T:  05/30/2002  Job:  [id number]

## 2010-08-11 NOTE — Discharge Summary (Signed)
[name], [name]                ACCOUNT NO.:  [id number]   MEDICAL RECORD NO.:  [id number]          PATIENT TYPE:  INP   LOCATION:  3305                         FACILITY:  [facility]   PHYSICIAN:  Ricki Rodriguez, M.D.  DATE OF BIRTH:  04-05-1945   DATE OF ADMISSION:  01/01/2004  DATE OF DISCHARGE:  01/03/2004                                 DISCHARGE SUMMARY   DISCHARGE DIAGNOSES:  1.  Chest pain.  2.  Sinus bradycardia secondary to calcium channel blocker.  3.  Chronic anemia.  4.  Manic depression.  5.  Hypertension.  6.  Coronary artery disease.  7.  Hypothyroidism.  8.  Diabetes mellitus type 2.   DISCHARGE MEDICATIONS:  1.  Lantus 10 units at bedtime subcu.  2.  Lasix 20 mg one p.o. daily.  3.  Kay Ciel 20 mEq two daily.  4.  Lodine 400 mg one twice daily.  5.  Ferrous sulfate one twice daily.  6.  Aspirin 325 mg one p.o. daily.  7.  Plavix 75 mg one p.o. daily.  8.  Diovan 160 mg one p.o. daily.  9.  IMDUR 60 mg half tablet daily.  10. Paxil 20 mg two daily.  11. Lithium 450 mg one daily.  12. Seroquel 100 mg one at bedtime.  13. Avandia one daily in the a.m.  14. Levothroid 150 mcg one daily.  15. Pletal 100 mg two daily.  16. Calcium 600 mg one or two daily.  17. Nitroglycerin 0.4 mg tablet one sublingual q.5 minutes x3 as needed for      chest pain and as directed.  18. Percocet 5/650 mg one twice daily.  19. Cardizem 30 mg one twice daily.   ACTIVITY:  As tolerated.   DIET:  Low fat, low salt diet.   FOLLOW UP:  In two weeks with Ricki Rodriguez, M.D.   CONDITION ON DISCHARGE:  Improved.   HISTORY OF PRESENT ILLNESS:  This 65 year old black female with recent  cardiac catheterization with minimal coronary artery disease had a crushing-  type chest pain, retrosternal, nonradiating, increased by palpation and deep  breathing, without any fever, cough, or sweating.  The patient was chest  pain-free on examination in the emergency room with four baby  aspirin, had  some dizziness on Friday and has cardiac risk factors of diabetes,  hypertension, and hyperlipidemia along with some obesity.   PHYSICAL EXAMINATION:  VITAL SIGNS:  Pulse 44, respirations 16, blood  pressure 171/67, height 5 feet 4 inches, weight 224 pounds.  GENERAL:  The patient was alert and oriented x3.  HEENT:  The patient was normocephalic and atraumatic.  Her brown and  prominent.  Conjunctivae pink.  Sclerae white. Ears, nose, and throat  unremarkable.  NECK:  No JVD and no carotid bruits and no thyromegaly.  LUNGS:  Clear bilaterally on auscultation.  HEART:  Normal S1 and S2 with grade 4/6 systolic murmur at the left sternal  border.  ABDOMEN:  Soft and distended.  EXTREMITIES:  1+ edema.  CNS:  Bilateral equal grips.   EKG reveals severe  sinus bradycardia.   LABORATORY DATA:  Hemoglobin 10.3, hematocrit 30.6, normal WBC count,  platelet count, electrolytes, BUN and creatinine.  Glucose borderline at  118.  INR was 1.0.  CK-MB, myoglobin, and troponin I were normal x3.   HOSPITAL COURSE:  The patient was admitted to subacute care unit.  Her heart  rate improved by withholding Cardizem.  She had no additional chest pain and  she had a recent cardiac catheterization that did not show any significant  coronary artery disease.  The patient ambulated well without any additional  chest pain or dizziness and her heart rate improved to the 50's.  Her  thyroid function test was within normal limits.  Her iron studies were  pending at the time of dictation, although, it is known to be a chronic  condition and had a previous GI workup done.  Additional GI workup will be  done on outpatient basis.       ASK/MEDQ  D:  01/03/2004  T:  01/03/2004  Job:  [id number]

## 2010-08-11 NOTE — Cardiovascular Report (Signed)
NAMECASHE, GATT                            ACCOUNT NO.:  [id number]   MEDICAL RECORD NO.:  [id number]                   PATIENT TYPE:  OIB   LOCATION:  2860                                 FACILITY:  [facility]   PHYSICIAN:  Ricki Rodriguez, M.D.               DATE OF BIRTH:  January 14, 1946   DATE OF PROCEDURE:  10/08/2003  DATE OF DISCHARGE:  10/08/2003                              CARDIAC CATHETERIZATION   PROCEDURE:  1. Right and left heart catheterization.  2. Selective coronary angiography.  3. Left ventricular function study.  4. Cardiac output study.   INDICATION:  This 65 year old female with a known history of coronary artery  disease, has some chest pain and a history of pulmonary hypertension with a  possible progression of the disease.   APPROACH:  The right femoral artery using a 6-French sheath and right  femoral vein using 8-French sheath.   COMPLICATIONS:  None.   HEMODYNAMIC DATA:  The left ventricular pressure was 163/17 and aortic  pressure was 160/77.  Pulmonary artery pressure was 38/16 and wedge pressure  was 40/37.  Right ventricular pressure was 40/13 and right atrial pressure  was 16/12.  Saturation was 94% in aorta and 71% on the pulmonary artery on  room air.  Cardiac output was 7 L by thermodilution method and cardiac index  was 3.4.  The cardiac output index by Fick method was similar.   LEFT VENTRICULOGRAM:  The left ventriculogram showed ejection fraction of  75% with left ventricular hypertrophy.   CORONARY ANATOMY:  Left main coronary artery:  The left main coronary artery  was short and unremarkable.   Left anterior descending coronary artery:  The left anterior descending  coronary artery had proximal luminal irregularities.  Its diagonal vessel  had mild luminal irregularities.   Left circumflex coronary artery:  The left circumflex coronary artery was  dominant, had luminal irregularities and a small obtuse marginal branch 1  and 2 and a  posterolateral and posterior descending coronary artery were  unremarkable.  The obtuse marginal artery stent site was patent with a 20%  to 30% proximal stenosis.   Right coronary artery:  The right coronary artery was nondominant with a 20%  to 30% proximal lesion.   IMPRESSION:  1. Mild multivessel coronary artery disease.  2. Mild pulmonary (secondary) hypertension.  3. Hypertensive heart disease.   RECOMMENDATION:  This patient will be controlled medically with a possible  increasing vasodilator therapy to control hypertension, as well as secondary  pulmonary hypertension.                                               Ricki Rodriguez, M.D.    ASK/MEDQ  D:  11/03/2003  T:  11/04/2003  Job:  [id number]

## 2010-08-11 NOTE — H&P (Signed)
NAMEJAYNA, [name]                ACCOUNT NO.:  [id number]   MEDICAL RECORD NO.:  [id number]          PATIENT TYPE:  INP   LOCATION:  3305                         FACILITY:  [facility]   PHYSICIAN:  Ricki Rodriguez, M.D.  DATE OF BIRTH:  1945/07/09   DATE OF ADMISSION:  01/01/2004  DATE OF DISCHARGE:  01/03/2004                                HISTORY & PHYSICAL   CHIEF COMPLAINT:  Chest pain.   HISTORY OF PRESENT ILLNESS:  This is a 65 year old black female who  complained of crushing-type chest pain which was retrosternal, nonradiating  since 2 p.m. the day of admission.  The pain was increased by palpation and  deep-breathing.  There was no fever, cough, or sweating spell.  The patient  was chest pain-free on my arrival with the use of four baby aspirin,  however, complained of some dizziness on Friday.   PAST MEDICAL HISTORY:  Positive for diabetes for 10 years and hypertension  for 12 years and hyperlipidemia along with obesity and negative for smoking,  alcohol intake, myocardial infarction, or family history of premature  coronary artery disease.   PAST SURGICAL HISTORY:  Thyroid surgery in 1979, right breast benign cyst  removal in 1980.  Hysterectomy and appendectomy in 1988.  Right shoulder and  ankle surgery in 2004 and cardiac catheterization four months ago showing  minimal coronary artery disease.   MEDICATIONS:  1.  Paroxetine 40 mg daily.  2.  Eskalith CR 450 mg daily.  3.  Seroquel 100 mg at bedtime.  4.  Diltiazem 240 mg one daily.  5.  Avandia 8 mg daily.  6.  Aspirin 325 mg daily.  7.  Levothyroxine 150 mcg one daily.  8.  Diovan 160 mg one daily.  9.  Pletal 100 mg two daily.  10. Lantus 10 units subcutaneously in the evening.  11. Hydrocodone APAP 100/650 mg four times daily as needed.  12. Lasix 20 mg on Monday/Wednesday/Friday.  13. Nitroglycerin 0.4 mg tablet one sublingual q.5 minutes x3 p.r.n. for      chest pain.  14. Calcium 160 mg one daily.  15.  Etodolac SA 400 mg one twice daily.   ALLERGIES:  No known drug allergies.   SOCIAL HISTORY:  The patient is married.  Her husband, Molly Maduro, is 50 years  old.  The patient has three daughters and one adopted son.   FAMILY HISTORY:  Mother is living at age 48, father died of brain problem in  his 52's.  The patient has two brothers and two sisters and all of them are  living.   REVIEW OF SYSTEMS:  Negative weight gain.  The patient wears glasses.  No  history of dentures or asthma.  Has history of chest pain and dizziness.  Also has history of leg swelling and hiatal hernia.  No history of GI bleed,  blood transfusion, hepatitis, kidney stones, stroke, or seizures.  The  patient has history of psychiatric admission for manic depression.  She took  flu shot and pneumonia shot one year ago.   PHYSICAL EXAMINATION:  VITAL SIGNS:  Pulse 44, respirations 16, blood  pressure 171/67, height 5 feet 4 inches, weight 224 pounds.  GENERAL:  The patient was alert and oriented x3.  HEENT:  Head is normocephalic and atraumatic.  Eyes; brown eyes,  conjunctivae pink, sclerae nonicteric.  Pupils equal, round, and reactive to  light.  Ears, nose, and throat are grossly unremarkable.  NECK:  No JVD and no carotid bruits.  LUNGS:  Clear bilaterally.  HEART:  Normal S1 and S2 with grade 4/6 systolic murmur of the left sternal  border.  ABDOMEN:  Soft and distended.  EXTREMITIES:  1+ edema.  CNS:  Bilateral full grips and the patient moves all four extremities.   LABORATORY DATA:  Normal WBC count, platelet count.  Electrolytes, BUN and  creatinine.  Normal INR and CK-MB, myoglobin, and troponin-I x3.  Hemoglobin  down to 10.3, hematocrit 31.6.   EKG revealed a severe sinus bradycardia.   PLAN:  Admit the patient to subacute unit due to severe bradycardia, hold  Cardizem, check thyroid function test and consider pacemaker placement if  heart rate does not improve.       ASK/MEDQ  D:   01/03/2004  T:  01/03/2004  Job:  [id number]

## 2010-08-11 NOTE — H&P (Signed)
NAMESHARILYNN, [name]                            ACCOUNT NO.:  [id number]   MEDICAL RECORD NO.:  [id number]                   PATIENT TYPE:  OIB   LOCATION:  2550                                 FACILITY:  [facility]   PHYSICIAN:  Myrtie Neither, M.D.                 DATE OF BIRTH:  1946-03-10   DATE OF ADMISSION:  08/19/2002  DATE OF DISCHARGE:                                HISTORY & PHYSICAL   CHIEF COMPLAINT:  Painful, weak right shoulder.   HISTORY OF PRESENT ILLNESS:  This 65 year old has been followed for  impingement and rotator cuff tears of the right shoulder.  Treated by Dortha Kern, M.D.  Had recent worsening of her dyspnea.  She had an MRI done which  demonstrated complete disruption of the rotator cuff involving the right  shoulder.   PAST MEDICAL HISTORY:  1. ORIF of right ankle from fall.  2. Thyroid surgery in 1979.  3. Breast abscess on the right side in the 1980s.  4. Angioplasty in 2002.  5. Hysterectomy in the 1980s.  6. Appendectomy in the past.  7. Right foot surgery in the 1980s.  8. History of pneumonia, hospitalized x 1.  9. History of high blood pressure.  10.      Heart murmur.  11.      PTCA stent.  12.      Angina.  13.      Irregular heart rhythm.  14.      History of headaches.   REVIEW OF SYSTEMS:  Headaches and some shortness of breath.  The patient has  recently had right ankle surgery and had some stiffness persisting.   FAMILY HISTORY:  The patient is married and lives with her husband.  She  denies the use of alcohol or cigarettes.   ALLERGIES:  None known.   MEDICATIONS:  1. Paroxetine HCl 40 mg in the p.m.  2. Eskalith CR 450 mg p.o.  3. Seroquel 25 mg two tablets at bedtime.  4. Nifedipine ER 60 mg p.o.  5. Potassium chloride 10 mEq two p.o.  6. Hydrocodone/triamterene 37.5 mg p.o.  7. Prevacid 30 mg p.o.  8. Bextra 20 mg p.o.  9. Avandia 8 mg one-half tablet p.o.  10.      Aspirin 325 mg p.o.  11.      Humulog 75/25 mg 20  units in a.m.  12.      Synthroid 150 mcg p.o.  13.      Nitroglycerin tablet 0.4 mg p.r.n.  14.      Diovan p.o.  15.      Pletal 100 mg p.o.   FAMILY HISTORY:  Noncontributory.   PHYSICAL EXAMINATION:  VITAL SIGNS:  Temperature 98.1 degrees, pulse 58,  respirations 18, blood pressure 164/78.  HEIGHT:  64 inches.  WEIGHT:  227-1/2 pounds.  HEENT:  Normocephalic.  Conjunctivae clear.  NECK:  Supple.  CHEST:  Clear.  CARDIAC:  S1 and S2 regular.  EXTREMITIES:  Right shoulder tender anterolaterally.  Pain on abduction  above 90 degree position.  Pain on internal and external rotation.  Increased weakness and pain on resistive abduction.  Good grip.  Tension  intrinsic intact.   LABORATORY DATA:  The MRI demonstrates right rotator cuff tear.   IMPRESSION:  Rotator cuff tear and impingement of the right shoulder.   PLAN:  Right rotator cuff repair.                                               Myrtie Neither, M.D.    AC/MEDQ  D:  08/19/2002  T:  08/19/2002  Job:  [id number]

## 2010-08-11 NOTE — Discharge Summary (Signed)
[place]. [facility]  Patient:    [name], [name]                       MRN: [id number] Adm. Date:  [id number] Disc. Date: [id number] Attending:  Ricki Rodriguez                           Discharge Summary  REFERRING PHYSICIAN:  Dr. Dortha Kern.  PRINCIPAL DIAGNOSIS: 1. Angina. 2. Type 2 diabetes mellitus. 3. Hypertension. 4. Anxiety and depression. 5. Coronary artery disease. 6. Hypothyroidism. 7. Reflux esophagitis.  DISCHARGE MEDICATIONS:  1. Nifedipine 60 mg one p.o. q.d.  2. Paxil 20 mg one p.o. q.d.  3. ______ CR 450 mg one q.d.  4. Mellaril 10 mg one q.d.  5. Humalog 75/25 20 units b.i.d.  6. Synthroid 0.125 mg q.d.  7. Estrace 1 mg q.d.  8. Lipitor 10 mg 1/2 tablet daily.  9. Aspirin 325 mg one q.d. 10. Nitroglycerin 0.4 mg one sublingual every 5 minutes x 3 p.r.n. chest pain     as directed. 11. Maxzide 37.5 mg one daily. 12. Avandia 8 mg one q.d. 13. Nexium 40 mg one daily.  DISCHARGE ACTIVITY:  As tolerated.  DISCHARGE DIET:  Low fat, low salt diet, 1500 calorie diabetic diet.  FOLLOWUP:  Dr. Orpah Cobb in two weeks, and Dr. Dortha Kern in one month.  HISTORY OF PRESENT ILLNESS:  This is a 65 year old black female who complained of a three day history of substernal chest pressure, intermittent, without any shortness of breath or sweating spell.  However, had some radiation to both sides of the neck and the left arm.  Post-admission the patient was chest pain free.  PAST MEDICAL HISTORY: 1. Diabetes for 9 years. 2. Hypertension for 32 years. 3. History of elevated cholesterol level.  PAST SURGICAL HISTORY: 1. Cardiac catheterization in January 2001. 2. Percutaneous transluminal coronary angioplasty of obtuse marginal branch    done on Aug 11, 1999. 3. Her last colonoscopy was in November 2001.  PHYSICAL EXAMINATION:  VITAL SIGNS:  Temperature 98, pulse 55, respirations 20, blood pressure 139/66, height 5 feet 4 inches,  weight 223 pounds.  Oxygen saturation 99% on 2 L of oxygen.  GENERAL:  The patient is alert and oriented x 3.  HEENT:  Normocephalic, atraumatic.  Pupils are equal, round and reactive to light.  Extraocular movements intact.  Conjunctivae pink.  Sclerae nonicteric. Color of eyes was brown.  Ears, nose, throat:  Reveal mucus membranes pink and moist.  NECK:  No JVD, no carotid bruit.  LUNGS:  Clear bilaterally.  CHEST:  Nontender on palpation.  HEART:  Normal S1 and S2, no murmurs, rubs, or gallops.  ABDOMEN:  Soft and nontender.  EXTREMITIES:  No clubbing, cyanosis, or edema.  Peripheral pulses were 2+.  NEUROLOGIC:  Cranial nerves II-XII grossly intact.  LABORATORY DATA:  Normal electrolytes except for a potassium level of 3.1. Normal BUN and creatinine.  Normal troponin-I.  Normal CK-MBs.  EKG showed sinus bradycardia and incomplete right bundle branch block.  Chest x-ray revealed cardiomegaly without any acute process.  Adenosine Cardiolite stress test was negative for any ischemia, and ejection fraction was 60+%.  HOSPITAL COURSE:  The patient was admitted to telemetry unit, myocardial infarction was ruled out.  She was started on home medications.  She was also started on Lovenox 100 mg subcutaneously q.12h. and morphine 2 mg q.2h.  p.r.n. for chest pain.  She also had nitroglycerin drip at 10 to 20 mcg per minute. Her overall condition remained stable.  She was given Flexeril 10 mg 1/2 tablet b.i.d., and Darvocet-N 100 one p.o. q.i.d. for her neck discomfort. Her Adenosine Cardiolite stress test was negative for ischemia.  Her mild anemia would be worked up on an outpatient basis.  Her serum iron level was pending.  Additionally, her thyroid function and lipid panel will also be checked.  She was otherwise discharged home in satisfactory condition with a follow up by me in two weeks, and by Dr. Dortha Kern in 2 to 4 weeks. DD:  05/23/00 TD:  05/25/00 Job:  [id number]

## 2010-08-11 NOTE — Discharge Summary (Signed)
[name], [name]                ACCOUNT NO.:  [id number]   MEDICAL RECORD NO.:  [id number]          PATIENT TYPE:  INP   LOCATION:  4710                         FACILITY:  [facility]   PHYSICIAN:  Ricki Rodriguez, M.D.  DATE OF BIRTH:  [birth year]   DATE OF ADMISSION:  03/15/2005  DATE OF DISCHARGE:  03/16/2005                                 DISCHARGE SUMMARY   PRINCIPAL DIAGNOSES:  1.  Chest pain.  2.  Manic depressive disorder.  3.  Cardiac dysrhythmia.  4.  Hypothyroidism.  5.  Diabetes mellitus type II.  6.  Coronary atherosclerosis of native coronary vessels.  7.  Hypertension.  8.  Obesity.   DISCHARGE MEDICATIONS:  1.  Albuterol inhaler 2 puffs 4 times daily as needed.  2.  Eskalith CR 450 mg 1 daily.  3.  Seroquel 100 mg 1 at bedtime.  4.  The patient is to discontinue diltiazem.  5.  Avandia 8 mg 1 daily.  6.  Aspirin 81 mg 1 daily.  7.  Levothyroxine 150 mcg 1 daily.  8.  Prevacid 30 mg 1 daily.  9.  Diovan 160 mg 1 daily.  10. Lantus 10 units subcutaneously at bedtime.  11. Vytorin 10/20 mg 1 daily.  12. Imdur 60 mg 1 daily.  13. Furosemide 20 mg 1 daily.  14. Potassium 10 mEq 1 daily.  15. Ferrous sulfate 325 mg 1 daily.  16. NitroQuick 0.4 mg 1 sublingual every 5 minutes x8 as needed for chest      pain as directed.  17. Calcium 600 mg 1 daily.  18. Etodolac-SA 400 mg twice daily.  19. Detrol LA 4 mg 1 daily.  20. Hydrocodone 7.5/750 mg as needed and directed.   DISCHARGE DIET:  Low fat, low salt diet.   DISCHARGE ACTIVITIES:  The patient is to increase activity slowly.   FOLLOW UP:  By Dr. Ricki Rodriguez in 2 to 4 weeks. The patient to call [phone] for appointment.   HISTORY:  This 65 year old black female presented to the office with  pressure type chest pain radiating to the left arm. The patient had some  nausea 2 weeks ago and did not appear nauseated in the office, however, her  heart rate was down to the 40s with her past medical history  positive for  diabetes for 12 years, hypertension for 14 years, hyperlipidemia and obesity  and a known history of coronary artery disease.   PHYSICAL EXAMINATION:  VITAL SIGNS: Pulse: 49. Respiratory rate: 16. Blood  pressure: 138/60. Height: 5' 4, weight 239 pounds.  The patient is alert, oriented x3. Oxygen saturation was 98%.  HEENT: The patient is normocephalic/atraumatic. Wears glasses.  Brown eyes,  slightly pale pink conjunctivae, white sclerae.  NECK: Bilateral bruit, full range of motion, no JVD.  LUNGS: Clear bilaterally.  HEART: Normal S1, S2 with a grade 3/6 systolic murmur and a 2/6 diastolic  murmur.  ABDOMEN: Soft, but distended and nontender.  EXTREMITIES: No cyanosis, clubbing or edema.  CNS: Bilaterally complete with some stiffness from arthritis. The patient is  right  handed.   LABORATORY DATA:  Normal hemoglobin, hematocrit, white blood count, platelet  count. Normal electrocardiogram, BUN and creatinine. Glucose was elevated at  157. Liver enzymes were normal. CPK total elevated at 230 but MB is only 3.  Troponin I is 0.01 x 3. Cholesterol level was 136 with a triglyceride of 94,  HDL of 36, LDL of 81. TSH level was 2.7. Chest x-ray showing mild cardiac  enlargement, no acute disease.  CT of the abdomen and pelvis showed soft tissue masses within the spleen  similar to old lesions several years ago. Nuclear stress test was negative  for reversible ischemia.  EKG with marked sinus bradycardia.   HOSPITAL COURSE:  The patient was admitted to the telemetry unit, myocardial  infarction was ruled out. She underwent nuclear stress test, they felt that  she was with reversible ischemia, however, cardizem was discontinued due to  a possible reaction with Lithium causing bradyarrhythmia. Her heart rate  improved with holding Cardizem. She was discharged home in satisfactory  condition with a follow up by me in 2 to 4 weeks.      Ricki Rodriguez, M.D.   Electronically Signed     ASK/MEDQ  D:  05/16/2005  T:  05/17/2005  Job:  [id number]

## 2010-08-11 NOTE — H&P (Signed)
[name], [name]                ACCOUNT NO.:  [id number]   MEDICAL RECORD NO.:  [id number]          PATIENT TYPE:  INP   LOCATION:  4710                         FACILITY:  [facility]   PHYSICIAN:  Ricki Rodriguez, M.D.  DATE OF BIRTH:  September 27, 1945   DATE OF ADMISSION:  03/15/2005  DATE OF DISCHARGE:  03/16/2005                                HISTORY & PHYSICAL   CHIEF COMPLAINT:  Chest pain.   HISTORY OF PRESENT ILLNESS:  This 66 year old black female had pressure-type  chest pain with radiation to the left arm and some abdominal discomfort with  increasing abdominal girth without significant leg edema.  She denies any  nausea, vomiting, diarrhea or fever.  She had some nausea 2 weeks ago.   PAST MEDICAL HISTORY:  1.  Positive for diabetes for 12 years.  2.  Hypertension for 14 years.  3.  Hyperlipidemia with obesity.  4.  A known history of coronary artery disease.   PAST MEDICAL HISTORY:  Negative for smoking, alcohol intact, myocardial  infarction.   FAMILY HISTORY:  Premature coronary artery disease.   PAST SURGICAL HISTORY:  1.  The patient had thyroid surgery in 1979.  2.  Right breast benign cyst removal in 1980.  3.  Hysterectomy and appendectomy in 1988.  4.  Right shoulder and ankle surgery in 2004.  5.  Cardiac catheterization in 2005 showing minimal coronary artery disease.   MEDICATIONS:  1.  Eskalith CR 450 mg daily.  2.  Seroquel 100 mg at bedtime.  3.  Cardizem 30 mg three times daily.  4.  Avandia 8 mg daily.  5.  Aspirin 325 mg daily.  6.  Levothyroxine 150 mcg one daily.  7.  Diovan 160 mg one daily.  8.  Pletal 100 mg daily.  9.  Lantus 10 mg subcutaneously every evening.  10. Hydrocodone/APAP 7.5/750 four times daily as needed.  11. Lasix 20 mg on Monday, Wednesday and Friday.  12. Potassium 10 mEq on Monday, Wednesday, Friday.  13. Nitroglycerin 0.4 mg tablet one sublingual q5 minutes x3 as needed for      chest pain.  14. Calcium 600 mg daily.  15. etodolac SA 400 mg twice daily.   ALLERGIES:  None.   SOCIAL HISTORY:  The patient is married.  Husband, Molly Maduro, is 67 years old.  The patient has 3 daughters and 1 adopted son.   FAMILY HISTORY:  Mother died recently of Alzheimer's disease at age 77.  Father died of a brain problem in his 59s.  The patient has 2 brothers and 2  sisters, and all of them are living.   REVIEW OF SYSTEMS:  Positive for recent weight gain of 8-10 pounds in 1  week.  The patient wears glasses.  No history of hemoptysis.  Positive  history of asthma.  Negative history of pneumonia.  Negative history of  nausea, vomiting, diarrhea, constipation.  Negative history of GI bleed,  hepatitis, blood transfusion, strokes, psychiatric admissions.   PHYSICAL EXAMINATION:  VITAL SIGNS:  Pulse 49, respirations 16, blood  pressure 138/60, height 5 feet, 4  inches, weight 239 pounds.  GENERAL:  The patient was alert and oriented x3.  Oxygen saturation 98%.  HEENT:  The patient is normocephalic and atraumatic.  Wears glasses.  Has  brown eyes.  Some slightly pale pink conjunctivae.  White sclerae.  NECK:  Bilateral bruits.  Full range of motion.  No JVD.  LUNGS:  Clear bilaterally.  HEART:  Normal S1 and S2.  A grade 3/6 systolic murmur and 2/6 diastolic  murmur.  ABDOMEN:  Soft but distended and nontender.  EXTREMITIES:  No edema, cyanosis or clubbing.  CNS:  Bilaterally equal grip, but some stiffness from arthritis.  The  patient is right-handed.   ASSESSMENT:  1.  Chest pain, rule out myocardial infarction.  2.  Abdominal pain.  3.  Hyperthyroidism.  4.  Hypertension.  5.  Hypertrophic cardiomyopathy.  6.  Bipolar disorder.  7.  Bradycardia.   LABORATORY DATA:  EKG and chest x-ray pending.   PLAN:  Admit the patient to the telemetry unit, rule out myocardial  infarction.  Consider cardiac catheterization after holding Cardizem.  Check  TSH.  Get CT of the abdomen.  The patient may need a permanent  pacemaker if  her low heart rate continues after holding medications.      Ricki Rodriguez, M.D.  Electronically Signed     ASK/MEDQ  D:  03/15/2005  T:  03/17/2005  Job:  [id number]

## 2010-08-11 NOTE — Discharge Summary (Signed)
   [name], [name]                            ACCOUNT NO.:  [id number]   MEDICAL RECORD NO.:  [id number]                   PATIENT TYPE:  INP   LOCATION:  0457                                 FACILITY:  [facility]   PHYSICIAN:  Myrtie Neither, M.D.                 DATE OF BIRTH:  Feb 10, 1946   DATE OF ADMISSION:  05/29/2002  DATE OF DISCHARGE:  06/02/2002                                 DISCHARGE SUMMARY   ADMISSION DIAGNOSES:  1. Fractured lateral malleolus, right ankle.  2. High blood pressure.  3. Diabetes mellitus.  4. Hypercholesterolemia.   DISCHARGE DIAGNOSES:  1. Fractured lateral malleolus, right ankle.  2. Tear of the deltoid ligament, right ankle.  3. High blood pressure.  4. Diabetes mellitus.  5. Hypercholesterolemia.   CONSULTATIONS:  None.   INFECTIONS:  None.   OPERATION:  Open reduction and internal fixation lateral malleolus, right  ankle. Repair of deltoid ligament, right ankle. Application of short leg  cast.   HISTORY:  This is a 65 year old female who has sustained a fall at work on  the night of admission and sustained injury to her right ankle. The patient  came into [facility] Emergency Room complaining of pain and deformity of  her right ankle. The patient was found to have fracture dislocation of the  right ankle.   PERTINENT PHYSICAL:  That of the right ankle with tenderness, swollen ankle  both medially and laterally, pulses were intact.   X-ray revealed fracture dislocation of the right ankle. The patient  underwent preop laboratory, urinalysis, CMET, CBC, EKG, chest x-ray, and was  seen by Dr. Dortha Kern for clearance for surgery. The patient was found to  be stable enough to undergo surgery and had open reduction and internal  fixation at her malleolus, right ankle and repaired deltoid ligament and  application of short leg cast.   HOSPITAL COURSE:  The patient did quite well, started postop IV antibiotics,  ice packs, elevation,  nonweightbearing, pain brought under control. The  patient was started on nonweightbearing with the use of a  walker. The patient's pain was brought under control and was eventually able  to be discharged on Bextra 20 mg, nonweightbearing on the right side, return  to the office in one week. Percocet 5 mg, 1-2 q.4-6h. for pain. Ice packs  and elevation at home. The patient was discharged in stable satisfactory  condition.                                                 Myrtie Neither, M.D.    AC/MEDQ  D:  07/07/2002  T:  07/07/2002  Job:  [id number]

## 2010-08-11 NOTE — Consult Note (Signed)
[name], [name] NO.:  [id number]   MEDICAL RECORD NO.:  [id number]                   PATIENT TYPE:  INP   LOCATION:  0457                                 FACILITY:  [facility]   PHYSICIAN:  Sharyn Dross., M.D.               DATE OF BIRTH:  1946-03-21   DATE OF CONSULTATION:  05/29/2002  DATE OF DISCHARGE:                                   CONSULTATION   REFERRING PHYSICIAN:  Myrtie Neither, M.D.   HISTORY OF PRESENT ILLNESS:  This pleasant 65 year old black female has been  known to me for many years.  She has a history of diabetes mellitus with  neuropathy, and is on medications.  She was admitted today because of a  fracture of her ankle.  She states that she was walking to where her purse  was and slipped (possibly on water), and was subsequently falling.  She  states that she heard two snaps at this time in the lower body as she went  towards the floor.  She was unable to walk at that time, and was  subsequently brought to [facility] Emergency Room.  X-rays were  performed which confirmed that she had a fracture in possibly two locations  of the leg, and she was subsequently admitted into the hospital at this  time.   She has a history of diabetes mellitus and hyperlipidemia, and  hypothyroidism.  She is currently on medications for these particular  problems at this time.  She had noticed recently that she has had increased  weight gain that has been present at this time, and the concern was for her  cholesterols to establish that they were okay.   During the event of the fall, she denies any history of loss of  consciousness at this point.  She has been relatively stable, being sedated  by the ER staff at present, and presently was evaluated by the orthopaedic  doctor when a consultation was obtained.   CURRENT MEDICATIONS:  The patient is presently taking a series of  medications which she has been on at least for the past  year.  She was  recently evaluated by the staff in the office, on 05/26/02, she was still  presently taking the list of medications noted.  These medications are:  1. Paxil 40 mg daily.  2. Eskalith CR 450 mg daily.  3. Seroquel 25 mg two tablets q.h.s.  4. Nifedipine ER 60 mg daily.  5. Potassium chloride 10 mg two tablets daily.  6. Welchol 625 mg two tablets t.i.d.  7. Triamterene/hydrochlorothiazide 37.5 mg one tablet daily.  8. Prevacid 30 mg daily.  9. Bextra 20 mg daily.  10.      Avandia 4 mg daily.  11.      Aspirin (should be 81 mg daily).  12.      Humalog 75/25 20 units q.a.m.  and q.p.m.  13.      Synthroid 150 mg daily.  14.      Nitrostat 0.4 mg p.r.n.  15.      Diovan 160 mg daily.  16.      Pletal 300 mg daily.   REVIEW OF SYMPTOMS:  Positive for a history of headache and sinus problems  with sinusitis that was noted.  She has been treated with medications for  this.  She also has a history of foot pains.  Previous x-rays were negative.  She was diagnosed with pneumonia in 9/03, bilaterally, treated with  antibiotics.  History of hypothyroidism, on medications, and also a history  of diabetes mellitus which she is presently on insulin at this time.   PHYSICAL EXAMINATION:  GENERAL:  She is a pleasant female, obese.  VITAL SIGNS:  Stable.  HEENT:  Positive for arcus senilis.  NECK:  Supple.  LUNGS:  Clear to auscultation and percussion.  HEART:  Regular rate and rhythm without heaves, thrills, murmurs, or gallops  noted.  ABDOMEN:  Soft, obese, with no tenderness noted.  EXTREMITIES:  The right lower extremity in a cast for support.  The left  extremity was within normal limits.   IMPRESSION:  1. Fractured ankle, acute.  2. Diabetes mellitus type 2 with nephropathy.  3. Hyperlipidemia.  4. Hypothyroidism.  5. 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   RECOMMENDATIONS:  1. The patient is presently being admitted in for surgery at this  time.  2. We will start the patient back on her medications that she has been     taking in the past.  3. We will obtain a liver panel and a lipid panel with a hemoglobin A1C.  4. Accu-Checks a.c. and h.s. and adjust as needed.  5. When stable, schedule an MRI to evaluate her shoulder.  6. A consultation with the diabetic teaching service to help manage the     patient's diabetic care.   Depending upon these results, will determine the course of therapy.                                                Sharyn Dross., M.D.    JM/MEDQ  D:  05/29/2002  T:  05/30/2002  Job:  [id number]   cc:   Myrtie Neither, M.D.  [address] [place]  [place] [address]  Fax: [phone]

## 2010-11-01 ENCOUNTER — Other Ambulatory Visit (HOSPITAL_COMMUNITY): Payer: Self-pay | Admitting: Internal Medicine

## 2010-11-01 DIAGNOSIS — Z1231 Encounter for screening mammogram for malignant neoplasm of breast: Secondary | ICD-10-CM

## 2010-11-09 ENCOUNTER — Ambulatory Visit (HOSPITAL_COMMUNITY): Payer: BC Managed Care – PPO

## 2010-11-16 ENCOUNTER — Ambulatory Visit (HOSPITAL_COMMUNITY)
Admission: RE | Admit: 2010-11-16 | Discharge: 2010-11-16 | Disposition: A | Discharge status: Home / Self Care | Payer: Medicare Other | Source: Ambulatory Visit | Attending: Internal Medicine | Admitting: Internal Medicine

## 2010-11-16 DIAGNOSIS — Z1231 Encounter for screening mammogram for malignant neoplasm of breast: Secondary | ICD-10-CM | POA: Insufficient documentation

## 2011-01-02 LAB — POCT CARDIAC MARKERS
CKMB, poc: 2.6
CKMB, poc: 2.7
CKMB, poc: 2.9
Myoglobin, poc: 108
Myoglobin, poc: 137
Myoglobin, poc: 86.3
Operator id: 270651
Operator id: 270651
Operator id: 294341
Troponin i, poc: <0.05
Troponin i, poc: <0.05
Troponin i, poc: <0.05

## 2011-01-02 LAB — POCT I-STAT CREATININE
Creatinine, Ser: 1
Operator id: 270651

## 2011-01-02 LAB — DIFFERENTIAL
Basophils Absolute: 0
Basophils Relative: 0
Eosinophils Absolute: 0.1 — ABNORMAL LOW
Eosinophils Relative: 2
Lymphocytes Relative: 34
Lymphs Abs: 1.9
Monocytes Absolute: 0.3
Monocytes Relative: 6
Neutro Abs: 3.4
Neutrophils Relative %: 59

## 2011-01-02 LAB — I-STAT 8, (EC8 V) (CONVERTED LAB)
BUN: 15
Bicarbonate: 27.3 — ABNORMAL HIGH
Chloride: 107
Glucose, Bld: 174 — ABNORMAL HIGH
HCT: 43
Hemoglobin: 14.6
Operator id: 270651
Potassium: 4.3
Sodium: 141
TCO2: 29
pCO2, Ven: 51.2 — ABNORMAL HIGH
pH, Ven: 7.334 — ABNORMAL HIGH

## 2011-01-02 LAB — CBC
HCT: 39.7
Hemoglobin: 13.3
MCHC: 33.5
MCV: 88.5
Platelets: 298
RBC: 4.49
RDW: 12.7
WBC: 5.8

## 2011-01-10 LAB — LIPID PANEL
Cholesterol: 126
HDL: 38 — ABNORMAL LOW
LDL Cholesterol: 76
Total CHOL/HDL Ratio: 3.3
Triglycerides: 59
VLDL: 12

## 2011-01-10 LAB — CK TOTAL AND CKMB (NOT AT ARMC)
CK, MB: 2.2
CK, MB: 2.3
CK, MB: 2.6
Relative Index: 1.4
Relative Index: 1.4
Relative Index: 1.6
Total CK: 143
Total CK: 154
Total CK: 188 — ABNORMAL HIGH

## 2011-01-10 LAB — CBC
HCT: 34 — ABNORMAL LOW
HCT: 34.7 — ABNORMAL LOW
HCT: 36.9
Hemoglobin: 11.4 — ABNORMAL LOW
Hemoglobin: 11.7 — ABNORMAL LOW
Hemoglobin: 12.3
MCHC: 33.3
MCHC: 33.5
MCHC: 33.6
MCV: 89.8
MCV: 90.3
MCV: 90.6
Platelets: 267
Platelets: 290
Platelets: 318
RBC: 3.77 — ABNORMAL LOW
RBC: 3.86 — ABNORMAL LOW
RBC: 4.07
RDW: 12.5
RDW: 12.8
RDW: 13
WBC: 5.6
WBC: 6.1
WBC: 6.3

## 2011-01-10 LAB — COMPREHENSIVE METABOLIC PANEL
ALT: 31
AST: 40 — ABNORMAL HIGH
Albumin: 4.1
Alkaline Phosphatase: 76
BUN: 23
CO2: 29
Calcium: 10.6 — ABNORMAL HIGH
Chloride: 106
Creatinine, Ser: 1.06
GFR calc Af Amer: >60
GFR calc non Af Amer: 53 — ABNORMAL LOW
Glucose, Bld: 104 — ABNORMAL HIGH
Potassium: 4.2
Sodium: 143
Total Bilirubin: 0.6
Total Protein: 6.9

## 2011-01-10 LAB — TROPONIN I
Troponin I: 0.01
Troponin I: 0.01

## 2011-01-10 LAB — HEPARIN LEVEL (UNFRACTIONATED)
Heparin Unfractionated: 0.48
Heparin Unfractionated: 0.64
Heparin Unfractionated: 0.81 — ABNORMAL HIGH
Heparin Unfractionated: 1.04 — ABNORMAL HIGH

## 2011-01-10 LAB — PROTIME-INR
INR: 1
Prothrombin Time: 13.4

## 2011-01-10 LAB — APTT: aPTT: 39 — ABNORMAL HIGH

## 2011-01-10 LAB — TSH: TSH: 1.111

## 2011-01-10 LAB — MAGNESIUM: Magnesium: 2.1

## 2011-01-10 LAB — B-NATRIURETIC PEPTIDE (CONVERTED LAB): Pro B Natriuretic peptide (BNP): <30

## 2011-03-15 ENCOUNTER — Ambulatory Visit (HOSPITAL_COMMUNITY)
Admission: RE | Admit: 2011-03-15 | Discharge: 2011-03-15 | Disposition: A | Discharge status: Home / Self Care | Payer: Medicare Other | Source: Ambulatory Visit | Attending: Cardiovascular Disease | Admitting: Cardiovascular Disease

## 2011-03-15 DIAGNOSIS — R52 Pain, unspecified: Secondary | ICD-10-CM

## 2011-03-15 DIAGNOSIS — M79609 Pain in unspecified limb: Secondary | ICD-10-CM | POA: Insufficient documentation

## 2011-03-15 NOTE — Progress Notes (Signed)
*  PRELIMINARY RESULTS*  BLEV Duplex has been performed.  No obvious evidence of deep vein thrombosis bilaterally. No evidence of a Baker's Cyst bilaterally.  [name] 03/15/2011, 1:28 PM

## 2011-10-19 ENCOUNTER — Other Ambulatory Visit (HOSPITAL_COMMUNITY): Payer: Self-pay | Admitting: Internal Medicine

## 2011-10-19 DIAGNOSIS — Z1231 Encounter for screening mammogram for malignant neoplasm of breast: Secondary | ICD-10-CM

## 2011-11-19 ENCOUNTER — Ambulatory Visit (HOSPITAL_COMMUNITY): Payer: Medicare Other

## 2011-11-29 ENCOUNTER — Ambulatory Visit (HOSPITAL_COMMUNITY)
Admission: RE | Admit: 2011-11-29 | Discharge: 2011-11-29 | Disposition: A | Discharge status: Home / Self Care | Payer: Medicare Other | Source: Ambulatory Visit | Attending: Internal Medicine | Admitting: Internal Medicine

## 2011-11-29 DIAGNOSIS — Z1231 Encounter for screening mammogram for malignant neoplasm of breast: Secondary | ICD-10-CM | POA: Insufficient documentation

## 2012-10-21 ENCOUNTER — Ambulatory Visit (HOSPITAL_COMMUNITY)
Admission: RE | Admit: 2012-10-21 | Discharge: 2012-10-21 | Disposition: A | Discharge status: Home / Self Care | Payer: Medicare Other | Source: Ambulatory Visit | Attending: Cardiovascular Disease | Admitting: Cardiovascular Disease

## 2012-10-21 ENCOUNTER — Other Ambulatory Visit (HOSPITAL_COMMUNITY): Payer: Self-pay | Admitting: Cardiovascular Disease

## 2012-10-21 DIAGNOSIS — R05 Cough: Secondary | ICD-10-CM | POA: Insufficient documentation

## 2012-10-21 DIAGNOSIS — R059 Cough, unspecified: Secondary | ICD-10-CM | POA: Insufficient documentation

## 2012-10-21 DIAGNOSIS — J40 Bronchitis, not specified as acute or chronic: Secondary | ICD-10-CM

## 2012-10-21 DIAGNOSIS — R079 Chest pain, unspecified: Secondary | ICD-10-CM | POA: Insufficient documentation

## 2012-10-31 ENCOUNTER — Other Ambulatory Visit (HOSPITAL_COMMUNITY): Payer: Self-pay | Admitting: Internal Medicine

## 2012-10-31 DIAGNOSIS — Z1231 Encounter for screening mammogram for malignant neoplasm of breast: Secondary | ICD-10-CM

## 2012-11-26 LAB — HM COLONOSCOPY

## 2012-11-28 ENCOUNTER — Ambulatory Visit (HOSPITAL_COMMUNITY): Payer: Medicare Other

## 2012-12-04 ENCOUNTER — Ambulatory Visit (HOSPITAL_COMMUNITY)
Admission: RE | Admit: 2012-12-04 | Discharge: 2012-12-04 | Disposition: A | Discharge status: Home / Self Care | Payer: Medicare Other | Source: Ambulatory Visit | Attending: Internal Medicine | Admitting: Internal Medicine

## 2012-12-04 DIAGNOSIS — Z1231 Encounter for screening mammogram for malignant neoplasm of breast: Secondary | ICD-10-CM

## 2013-12-02 ENCOUNTER — Other Ambulatory Visit (HOSPITAL_COMMUNITY): Payer: Self-pay | Admitting: Internal Medicine

## 2013-12-02 DIAGNOSIS — Z1231 Encounter for screening mammogram for malignant neoplasm of breast: Secondary | ICD-10-CM

## 2013-12-08 ENCOUNTER — Ambulatory Visit (HOSPITAL_COMMUNITY): Payer: Medicare Other

## 2014-01-07 ENCOUNTER — Ambulatory Visit (HOSPITAL_COMMUNITY)
Admission: RE | Admit: 2014-01-07 | Discharge: 2014-01-07 | Disposition: A | Discharge status: Home / Self Care | Payer: Medicare Other | Source: Ambulatory Visit | Attending: Internal Medicine | Admitting: Internal Medicine

## 2014-01-07 DIAGNOSIS — Z1231 Encounter for screening mammogram for malignant neoplasm of breast: Secondary | ICD-10-CM | POA: Insufficient documentation

## 2014-01-18 ENCOUNTER — Ambulatory Visit
Admission: RE | Admit: 2014-01-18 | Discharge: 2014-01-18 | Disposition: A | Discharge status: Home / Self Care | Payer: Medicare Other | Source: Ambulatory Visit | Attending: Orthopedic Surgery | Admitting: Orthopedic Surgery

## 2014-01-18 ENCOUNTER — Other Ambulatory Visit: Payer: Self-pay | Admitting: Cardiovascular Disease

## 2014-01-18 ENCOUNTER — Other Ambulatory Visit: Payer: Self-pay | Admitting: Orthopedic Surgery

## 2014-01-18 ENCOUNTER — Ambulatory Visit
Admission: RE | Admit: 2014-01-18 | Discharge: 2014-01-18 | Disposition: A | Discharge status: Home / Self Care | Payer: Medicare Other | Source: Ambulatory Visit | Attending: Cardiovascular Disease | Admitting: Cardiovascular Disease

## 2014-01-18 DIAGNOSIS — M109 Gout, unspecified: Secondary | ICD-10-CM

## 2014-01-18 DIAGNOSIS — M542 Cervicalgia: Secondary | ICD-10-CM

## 2015-01-12 ENCOUNTER — Other Ambulatory Visit: Payer: Self-pay

## 2015-01-12 DIAGNOSIS — Z1231 Encounter for screening mammogram for malignant neoplasm of breast: Secondary | ICD-10-CM

## 2015-01-25 ENCOUNTER — Ambulatory Visit
Admission: RE | Admit: 2015-01-25 | Discharge: 2015-01-25 | Disposition: A | Discharge status: Home / Self Care | Payer: Medicare Other | Source: Ambulatory Visit

## 2015-01-25 DIAGNOSIS — Z1231 Encounter for screening mammogram for malignant neoplasm of breast: Secondary | ICD-10-CM

## 2015-03-17 ENCOUNTER — Ambulatory Visit
Admission: RE | Admit: 2015-03-17 | Discharge: 2015-03-17 | Disposition: A | Discharge status: Home / Self Care | Payer: Medicare Other | Source: Ambulatory Visit | Attending: Cardiovascular Disease | Admitting: Cardiovascular Disease

## 2015-03-17 ENCOUNTER — Other Ambulatory Visit: Payer: Self-pay | Admitting: Cardiovascular Disease

## 2015-03-17 DIAGNOSIS — R05 Cough: Secondary | ICD-10-CM

## 2015-03-17 DIAGNOSIS — R059 Cough, unspecified: Secondary | ICD-10-CM

## 2015-05-05 ENCOUNTER — Encounter: Payer: Self-pay | Admitting: Neurology

## 2015-05-05 ENCOUNTER — Ambulatory Visit (INDEPENDENT_AMBULATORY_CARE_PROVIDER_SITE_OTHER): Payer: Medicare Other | Admitting: Neurology

## 2015-05-05 VITALS — BP 120/62 | HR 68 | Resp 20 | Ht 62.5 in | Wt 213.0 lb

## 2015-05-05 DIAGNOSIS — R0683 Snoring: Secondary | ICD-10-CM | POA: Diagnosis not present

## 2015-05-05 DIAGNOSIS — G4700 Insomnia, unspecified: Secondary | ICD-10-CM | POA: Diagnosis not present

## 2015-05-05 NOTE — Progress Notes (Signed)
SLEEP MEDICINE CLINIC   Provider:  Melvyn Novas, M D  Referring Provider: [name], MD Primary Care Physician:  [name], MD  Chief Complaint  Patient presents with  . New Patient (Initial Visit)    snoring, rm 11, alone    HPI:  [name] is a 70 y.o. female , seen here as a referral  from Dr. Allyne Gee for a sleep evaluation,  This right handed aa female patient reports that her husband witnessed her snoring and she was referred for evaluation of sleep apnea. She wakes up frequently, complaining of fragmented sleep. Her sleep quality feels poor, she is not restored or refreshed .  "when I get really tired is when I cannot sleep" " I am easily startled-Jumpy- and anxious ", her insomnia  problems are present for more than 10 years.  Mrs. Peer's physician also noted that the patient may have this ongoing sleep problems were longer than a decade, and that she has the comorbidities of chronic kidney disease stage II, hypertensive renal disease, hypercholesterolemia, diabetes and renal disease. Obesity, hypothyroidism as well as gastroesophageal reflux disease. She is currently taking calcium, aspirin, etodolac, as needed nitroglycerin sublingual, vitamin D, Prilosec, Restasis eyedrops, magnesium over-the-counter, biotin over-the-counter, metoprolol extended release, Travatan eyedrops, Tramadol  as needed for pain, seroquel 100 mg at night for sleep. Niaspan normal Klor-Con, and isosorbide mononitrate 60 mg ER, Synthroid 125 g, vitamin D and folic acid supplement. She had thyoidectomy in 1979  Sleep habits are as follows:  She goes to bed at 4 AM, that is until January when she changed the time to 11 PM, but she can only sleep when the TV is running in the background. She may go to sleep within 30 -60 minutes , only with seroquel. She reports that her feet tingle and that also prolongs her sleep latency she feels the irresistible urge to move either walk or massage or rub  her legs. With the help of Seroquel she will sleep for about 4-5 hours sometimes even 6. Without she is either unable to enter sleep or maintain sleep at all. She keeps her bedroom cool with a fan running, she usually switches the TV off by sleep timer, it is dark after that. Her husband has told her that she snores but she is probably equally disturbed by which she reports to be his sleeping pattern with louder snoring, significant apneas, and her husband often wakes her when he comes to the bedroom later than she. The patient sleeps on 2 pillows on her stomach.When she wakes she is usually still on her stomach, avoiding back pain. She wake sometimes with headaches, but is never woken by pain.  She does not nap in daytime.Sleep medical history and family sleep history: Social history: married, no tobacco use ever, ETOH none, caffeine - 2 cups a day, in AM. No sodas or Iced tea, but green tea once or twice a week. . 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   Review of Systems: Out of a complete 14 system review, the patient complains of only the following symptoms, and all other reviewed systems are negative.   Epworth score  0, Fatigue severity score 9  , depression score deferred.    Social History   Social History  . Marital Status: Married    Spouse Name: N/A  . Number of Children: N/A  . Years of Education: N/A   Occupational History  . retired    Social History Main  Topics  . Smoking status: Never Smoker   . Smokeless tobacco: Not on file  . Alcohol Use: No  . Drug Use: No  . Sexual Activity: Not on file   Other Topics Concern  . Not on file   Social History Narrative   Drinks 1-2 cups daily of coffee daily.    Family History  Problem Relation Age of Onset  . Alzheimer's disease Mother   . Aneurysm Father   . Cancer Brother     Past Medical History  Diagnosis Date  . Hypertension   . Pure hypercholesterolemia   . CKD stage 2 due to type 2  diabetes mellitus (HCC)   . Benign hypertensive renal disease   . Obesity   . Hypothyroidism   . Vitamin D deficiency   . PVD (peripheral vascular disease) [facility])     Past Surgical History  Procedure Laterality Date  . Thyroidectomy    . Abdominal hysterectomy    . Right and left rotator cuff    . Broken leg and displaced ankle      Current Outpatient Prescriptions  Medication Sig Dispense Refill  . amLODipine-valsartan (EXFORGE) 5-320 MG tablet Take 1 tablet by mouth daily.    [name] aspirin 81 MG tablet Take 81 mg by mouth daily.    [name] b complex vitamins capsule Take 1 capsule by mouth daily.    . Biotin 2500 MCG CAPS Take 2,500 mcg by mouth 2 (two) times daily.    . calcium gluconate 500 MG tablet Take 1 tablet by mouth daily.    . Chlorphen-PE-Acetaminophen (NOREL AD) 4-10-325 MG TABS Take by mouth.    . Cholecalciferol 4000 units CAPS Take 4,000 Units by mouth.    . cilostazol (PLETAL) 100 MG tablet Take 100 mg by mouth 2 (two) times daily.    . Cinnamon 500 MG capsule Take 500 mg by mouth daily.    . CYANOCOBALAMIN PO Take by mouth.    . cycloSPORINE (RESTASIS) 0.05 % ophthalmic emulsion 1 drop 2 (two) times daily.    . Doxepin HCl (SILENOR) 3 MG TABS Take 3 mg by mouth.    . etodolac (LODINE) 400 MG tablet Take 400 mg by mouth 2 (two) times daily.    . furosemide (LASIX) 20 MG tablet Take 20 mg by mouth.    . insulin glargine (LANTUS) 100 UNIT/ML injection Inject 26 Units into the skin at bedtime.    . isosorbide mononitrate (IMDUR) 60 MG 24 hr tablet Take 60 mg by mouth daily.    [name] levothyroxine (SYNTHROID, LEVOTHROID) 125 MCG tablet Take 125 mcg by mouth daily before breakfast.    . metoprolol succinate (TOPROL-XL) 25 MG 24 hr tablet Take 25 mg by mouth daily.    . niacin (NIASPAN) 500 MG CR tablet Take 500 mg by mouth at bedtime.    [name] omeprazole (PRILOSEC) 20 MG capsule Take 20 mg by mouth daily.    . potassium chloride (K-DUR,KLOR-CON) 10 MEQ tablet Take 10 mEq by mouth 2  (two) times daily.    . simvastatin (ZOCOR) 20 MG tablet Take 20 mg by mouth daily.    . temazepam (RESTORIL) 15 MG capsule Take 15 mg by mouth at bedtime as needed for sleep.    . traMADol (ULTRAM) 50 MG tablet Take 50 mg by mouth every 6 (six) hours as needed.    . travoprost, benzalkonium, (TRAVATAN) 0.004 % ophthalmic solution 1 drop at bedtime.     No current facility-administered medications for  this visit.    Allergies as of 05/05/2015 - Review Complete 05/05/2015  Allergen Reaction Noted  . Atorvastatin  05/05/2015  . Metoprolol succinate er  05/05/2015  . Rosuvastatin calcium  05/05/2015    Vitals: BP 120/62 mmHg  Pulse 68  Resp 20  Ht 5' 2.5" (1.588 m)  Wt 213 lb (96.616 kg)  BMI 38.31 kg/m2 Last Weight:  Wt Readings from Last 1 Encounters:  05/05/15 213 lb (96.616 kg)   ZOX:WRUE mass index is 38.31 kg/(m^2).     Last Height:   Ht Readings from Last 1 Encounters:  05/05/15 5' 2.5" (1.588 m)    Physical exam:  General: The patient is awake, alert and appears not in acute distress. The patient is well groomed. Head: Normocephalic, atraumatic. Neck is supple. Mallampati 4,  neck circumference:15. Nasal airflow unrestricted , TMJ is not evident . Retrognathia is seen.  Cardiovascular:  Regular rate and rhythm , without  murmurs or carotid bruit, and without distended neck veins. Respiratory: Lungs are clear to auscultation. Skin:  Without evidence of edema, or rash Trunk:  obesity  Neurologic exam : The patient is awake and alert, oriented to place and time.   Memory subjective described as intact.  Attention span & concentration ability appears normal.  Speech is fluent,  without dysarthria, dysphonia or aphasia.  Mood and affect are appropriate.  Cranial nerves: Pupils are equal and briskly reactive to light. Funduscopic exam deferred. Extraocular movements  in vertical and horizontal planes intact and without nystagmus.  Visual fields by finger perimetry are  intact. Hearing to finger rub intact.   Facial sensation intact to fine touch.  Facial motor strength is symmetric and tongue and uvula move midline. Shoulder shrug was symmetrical.   Motor exam:  Normal tone, muscle bulk and symmetric strength in all extremities.  Sensory:  Fine touch, pinprick and vibration were tested in all extremities. Proprioception tested in the upper extremities was normal.  Coordination: ,Finger-to-nose maneuver - without evidence of ataxia, dysmetria or tremor.  Gait and station: Patient walks without assistive device and is able unassisted to climb up to the exam table. Strength within normal limits.  Stance is stable and normal. Turns with 4 Steps. Romberg testing is negative.  Deep tendon reflexes: in the  upper and lower extremities are symmetric and intact. Babinski maneuver response is attenuated , but  downgoing.  The patient was advised of the nature of the diagnosed sleep disorder , the treatment options and risks for general a health and wellness arising from not treating the condition.  I spent more than 40 minutes of face to face time with the patient. Greater than 50% of time was spent in counseling and coordination of care. We have discussed the diagnosis and differential and I answered the patient's questions.     Assessment:  After physical and neurologic examination, review of laboratory studies,  Personal review of imaging studies, reports of other /same  Imaging studies ,  Results of polysomnography/ neurophysiology testing and pre-existing records as far as provided in visit., my assessment is   1) snoring with chronic insomnia , but not hypersomnia   2) racing thoughts, tingling and numbness in feet all prolong the sleep latency .  3) diabetic patient on eroquel for sleep initiation     Plan:  Treatment plan and additional workup :  This patient my be best served with a HST, but I am not dure about her insurance guideline.   Will order  a in lab test, with special attention to hypoxemia, and to hypercapnia.   If no sleep disorder is identified, return to PCP. Porfirio Mylar Dolton Shaker MD  05/05/2015   CC: [name], Md [address] [place], [place] [address]

## 2015-05-05 NOTE — Addendum Note (Signed)
Addended by: Melvyn Novas on: 05/05/2015 02:52 PM   Modules accepted: Orders

## 2015-05-19 ENCOUNTER — Ambulatory Visit
Admission: RE | Admit: 2015-05-19 | Discharge: 2015-05-19 | Disposition: A | Discharge status: Home / Self Care | Payer: Medicare Other | Source: Ambulatory Visit | Attending: Cardiovascular Disease | Admitting: Cardiovascular Disease

## 2015-05-19 ENCOUNTER — Other Ambulatory Visit: Payer: Self-pay | Admitting: Cardiovascular Disease

## 2015-05-19 DIAGNOSIS — M25532 Pain in left wrist: Secondary | ICD-10-CM

## 2015-06-07 ENCOUNTER — Ambulatory Visit (INDEPENDENT_AMBULATORY_CARE_PROVIDER_SITE_OTHER): Payer: Medicare Other | Admitting: Neurology

## 2015-06-07 DIAGNOSIS — G4710 Hypersomnia, unspecified: Secondary | ICD-10-CM | POA: Diagnosis not present

## 2015-06-07 DIAGNOSIS — R0683 Snoring: Secondary | ICD-10-CM

## 2015-06-07 DIAGNOSIS — G4700 Insomnia, unspecified: Secondary | ICD-10-CM

## 2015-06-07 NOTE — Sleep Study (Signed)
Please see the scanned sleep study interpretation located in the Procedure tab within the Chart Review section. 

## 2015-06-14 ENCOUNTER — Telehealth: Payer: Self-pay

## 2015-06-14 DIAGNOSIS — G4733 Obstructive sleep apnea (adult) (pediatric): Secondary | ICD-10-CM

## 2015-06-14 NOTE — Telephone Encounter (Signed)
Spoke to pt. She is busy at this time and will return my call later.

## 2015-06-14 NOTE — Telephone Encounter (Signed)
Patient is returning your call and would like a return call [phone].  Thanks!

## 2015-06-14 NOTE — Telephone Encounter (Signed)
Spoke to pt and advised her that her sleep study revealed severe osa and treatment is advised. I advised her that PAP therapy is indicated and Dr. Vickey Hugerohmeier recommends coming in for another cpap titration study to optimize therapy. I advised her that alternative therapies such as an oral appliance or ENT procedure do not usually work for hypoxemic patients. I advised her that weight loss and positional therapy are to be entertained. I advised her that frequent PLMS were seen in her study and treatment could be considered if the PLMS fail to resolve with PAP therapy. I advised pt to lose weight, diet, and exercise if not contraindicated by her other physicians. I advised her to avoid caffeine-containing beverages and chocolate. Pt verbalized understanding. Pt is agreeable to proceeding with cpap titration study. I advised her that our sleep lab would call her to get it scheduled once her insurance granted approval. Pt verbalized understanding.

## 2015-06-19 ENCOUNTER — Encounter: Payer: Self-pay | Admitting: *Deleted

## 2015-07-05 ENCOUNTER — Ambulatory Visit (INDEPENDENT_AMBULATORY_CARE_PROVIDER_SITE_OTHER): Payer: Medicare Other | Admitting: Neurology

## 2015-07-05 DIAGNOSIS — G4733 Obstructive sleep apnea (adult) (pediatric): Secondary | ICD-10-CM

## 2015-07-06 NOTE — Sleep Study (Signed)
Please see the scanned sleep study interpretation located in the procedure tab in the chart view section.  

## 2015-07-07 ENCOUNTER — Telehealth: Payer: Self-pay

## 2015-07-07 DIAGNOSIS — G4733 Obstructive sleep apnea (adult) (pediatric): Secondary | ICD-10-CM

## 2015-07-07 NOTE — Telephone Encounter (Signed)
Called pt and both numbers listed to discuss sleep study results. No answer, left a message asking pt to call me back.

## 2015-07-08 NOTE — Telephone Encounter (Signed)
Patient returned Kristen's call. Patient is aware Baxter HireKristen is out of the office until Monday, may call then.

## 2015-07-11 NOTE — Telephone Encounter (Signed)
I called pt again. No answer, left a message asking her to call me back. °

## 2015-07-11 NOTE — Telephone Encounter (Signed)
Spoke to pt. I advised her that her sleep study showed significant improvement with less respiratory events during titration and Dr. Vickey Hugerohmeier recommends starting CPAP. Pt is agreeable. I advised her that I would send the order to a DME and they would call her in about a week to get it set up. Pt verbalized understanding. Pt as advised to avoid caffeine containing beverages and chocolate. Pt verbalized understanding.  I advised pt to use her cpap at least four or more hours per night. Pt verbalized understanding. A follow up appt was made for 6/26 at 9:30. Pt verbalized understanding. Will send to [facility].

## 2015-07-18 ENCOUNTER — Observation Stay (HOSPITAL_COMMUNITY)
Admission: AD | Admit: 2015-07-18 | Discharge: 2015-07-22 | Disposition: A | Discharge status: Home / Self Care | Payer: Medicare Other | Source: Ambulatory Visit | Attending: Cardiovascular Disease | Admitting: Cardiovascular Disease

## 2015-07-18 ENCOUNTER — Inpatient Hospital Stay (HOSPITAL_COMMUNITY): Payer: Medicare Other

## 2015-07-18 DIAGNOSIS — E039 Hypothyroidism, unspecified: Secondary | ICD-10-CM | POA: Diagnosis not present

## 2015-07-18 DIAGNOSIS — R531 Weakness: Secondary | ICD-10-CM | POA: Diagnosis not present

## 2015-07-18 DIAGNOSIS — I129 Hypertensive chronic kidney disease with stage 1 through stage 4 chronic kidney disease, or unspecified chronic kidney disease: Secondary | ICD-10-CM | POA: Diagnosis not present

## 2015-07-18 DIAGNOSIS — I2510 Atherosclerotic heart disease of native coronary artery without angina pectoris: Secondary | ICD-10-CM | POA: Diagnosis not present

## 2015-07-18 DIAGNOSIS — R0789 Other chest pain: Secondary | ICD-10-CM | POA: Diagnosis not present

## 2015-07-18 DIAGNOSIS — J209 Acute bronchitis, unspecified: Secondary | ICD-10-CM | POA: Insufficient documentation

## 2015-07-18 DIAGNOSIS — R079 Chest pain, unspecified: Secondary | ICD-10-CM

## 2015-07-18 DIAGNOSIS — E669 Obesity, unspecified: Secondary | ICD-10-CM | POA: Insufficient documentation

## 2015-07-18 DIAGNOSIS — Z6837 Body mass index (BMI) 37.0-37.9, adult: Secondary | ICD-10-CM | POA: Diagnosis not present

## 2015-07-18 DIAGNOSIS — I209 Angina pectoris, unspecified: Secondary | ICD-10-CM | POA: Diagnosis present

## 2015-07-18 DIAGNOSIS — E785 Hyperlipidemia, unspecified: Secondary | ICD-10-CM | POA: Insufficient documentation

## 2015-07-18 DIAGNOSIS — Z7982 Long term (current) use of aspirin: Secondary | ICD-10-CM | POA: Diagnosis not present

## 2015-07-18 DIAGNOSIS — N182 Chronic kidney disease, stage 2 (mild): Secondary | ICD-10-CM | POA: Insufficient documentation

## 2015-07-18 DIAGNOSIS — E1122 Type 2 diabetes mellitus with diabetic chronic kidney disease: Secondary | ICD-10-CM | POA: Diagnosis not present

## 2015-07-18 LAB — COMPREHENSIVE METABOLIC PANEL
ALK PHOS: 72 U/L (ref 38–126)
ALT: 57 U/L — ABNORMAL HIGH (ref 14–54)
ANION GAP: 12 (ref 5–15)
AST: 138 U/L — ABNORMAL HIGH (ref 15–41)
Albumin: 3.7 g/dL (ref 3.5–5.0)
BILIRUBIN TOTAL: 0.7 mg/dL (ref 0.3–1.2)
BUN: 13 mg/dL (ref 6–20)
CALCIUM: 9.7 mg/dL (ref 8.9–10.3)
CO2: 24 mmol/L (ref 22–32)
Chloride: 106 mmol/L (ref 101–111)
Creatinine, Ser: 1.13 mg/dL — ABNORMAL HIGH (ref 0.44–1.00)
GFR, EST AFRICAN AMERICAN: 56 mL/min — AB (ref >60)
GFR, EST NON AFRICAN AMERICAN: 48 mL/min — AB (ref >60)
Glucose, Bld: 108 mg/dL — ABNORMAL HIGH (ref 65–99)
Potassium: 4.1 mmol/L (ref 3.5–5.1)
SODIUM: 142 mmol/L (ref 135–145)
TOTAL PROTEIN: 6.9 g/dL (ref 6.5–8.1)

## 2015-07-18 LAB — CBC WITH DIFFERENTIAL/PLATELET
BASOS ABS: 0 10*3/uL (ref 0.0–0.1)
BASOS PCT: 0 %
Eosinophils Absolute: 0 10*3/uL (ref 0.0–0.7)
Eosinophils Relative: 0 %
HEMATOCRIT: 40 % (ref 36.0–46.0)
HEMOGLOBIN: 13.3 g/dL (ref 12.0–15.0)
Lymphocytes Relative: 33 %
Lymphs Abs: 1.8 10*3/uL (ref 0.7–4.0)
MCH: 30.1 pg (ref 26.0–34.0)
MCHC: 33.3 g/dL (ref 30.0–36.0)
MCV: 90.5 fL (ref 78.0–100.0)
Monocytes Absolute: 0.5 10*3/uL (ref 0.1–1.0)
Monocytes Relative: 10 %
NEUTROS ABS: 3 10*3/uL (ref 1.7–7.7)
NEUTROS PCT: 57 %
Platelets: 226 10*3/uL (ref 150–400)
RBC: 4.42 MIL/uL (ref 3.87–5.11)
RDW: 12.9 % (ref 11.5–15.5)
WBC: 5.3 10*3/uL (ref 4.0–10.5)

## 2015-07-18 LAB — TROPONIN I

## 2015-07-18 LAB — PROTIME-INR
INR: 1.05 (ref 0.00–1.49)
PROTHROMBIN TIME: 13.9 s (ref 11.6–15.2)

## 2015-07-18 LAB — GLUCOSE, CAPILLARY: Glucose-Capillary: 128 mg/dL — ABNORMAL HIGH (ref 65–99)

## 2015-07-18 LAB — TSH: TSH: 0.997 u[IU]/mL (ref 0.350–4.500)

## 2015-07-18 LAB — ECHOCARDIOGRAM COMPLETE

## 2015-07-18 MED ORDER — METOPROLOL SUCCINATE ER 25 MG PO TB24
25.0000 mg | ORAL_TABLET | Freq: Every day | ORAL | Status: DC
  Administered 2015-07-18 – 2015-07-21 (×3): 25 mg via ORAL
  Filled 2015-07-18 (×4): qty 1

## 2015-07-18 MED ORDER — CILOSTAZOL 100 MG PO TABS
100.0000 mg | ORAL_TABLET | Freq: Two times a day (BID) | ORAL | Status: DC
  Administered 2015-07-18 – 2015-07-21 (×6): 100 mg via ORAL
  Filled 2015-07-18 (×9): qty 1

## 2015-07-18 MED ORDER — AMLODIPINE BESYLATE-VALSARTAN 5-320 MG PO TABS: 1.0000 | ORAL_TABLET | Freq: Every day | ORAL | Status: DC

## 2015-07-18 MED ORDER — TEMAZEPAM 15 MG PO CAPS: 15.0000 mg | ORAL_CAPSULE | Freq: Every evening | ORAL | Status: DC | PRN

## 2015-07-18 MED ORDER — LEVOTHYROXINE SODIUM 25 MCG PO TABS
125.0000 ug | ORAL_TABLET | Freq: Every day | ORAL | Status: DC
  Administered 2015-07-19 – 2015-07-22 (×4): 125 ug via ORAL
  Filled 2015-07-18 (×4): qty 1

## 2015-07-18 MED ORDER — SODIUM CHLORIDE 0.9 % IV SOLN: 250.0000 mL | INTRAVENOUS | Status: DC | PRN

## 2015-07-18 MED ORDER — IRBESARTAN 300 MG PO TABS
300.0000 mg | ORAL_TABLET | Freq: Every day | ORAL | Status: DC
Start: 2015-07-18 — End: 2015-07-22
  Administered 2015-07-18 – 2015-07-21 (×3): 300 mg via ORAL
  Filled 2015-07-18 (×4): qty 1

## 2015-07-18 MED ORDER — ASPIRIN 81 MG PO TABS: 81.0000 mg | ORAL_TABLET | Freq: Every day | ORAL | Status: DC

## 2015-07-18 MED ORDER — DOCUSATE SODIUM 100 MG PO CAPS
100.0000 mg | ORAL_CAPSULE | Freq: Two times a day (BID) | ORAL | Status: DC
  Administered 2015-07-18 – 2015-07-20 (×5): 100 mg via ORAL
  Filled 2015-07-18 (×7): qty 1

## 2015-07-18 MED ORDER — B COMPLEX-C PO TABS
1.0000 | ORAL_TABLET | Freq: Every day | ORAL | Status: DC
  Administered 2015-07-18 – 2015-07-21 (×3): 1 via ORAL
  Filled 2015-07-18 (×4): qty 1

## 2015-07-18 MED ORDER — ASPIRIN 81 MG PO CHEW
81.0000 mg | CHEWABLE_TABLET | Freq: Every day | ORAL | Status: DC
  Administered 2015-07-18 – 2015-07-21 (×3): 81 mg via ORAL
  Filled 2015-07-18 (×4): qty 1

## 2015-07-18 MED ORDER — ISOSORBIDE MONONITRATE ER 60 MG PO TB24
60.0000 mg | ORAL_TABLET | Freq: Every day | ORAL | Status: DC
  Administered 2015-07-18 – 2015-07-21 (×3): 60 mg via ORAL
  Filled 2015-07-18 (×4): qty 1

## 2015-07-18 MED ORDER — INSULIN GLARGINE 100 UNIT/ML ~~LOC~~ SOLN
26.0000 [IU] | Freq: Every day | SUBCUTANEOUS | Status: DC
  Administered 2015-07-18 – 2015-07-21 (×4): 26 [IU] via SUBCUTANEOUS
  Filled 2015-07-18 (×5): qty 0.26

## 2015-07-18 MED ORDER — AMLODIPINE BESYLATE 5 MG PO TABS
5.0000 mg | ORAL_TABLET | Freq: Every day | ORAL | Status: DC
  Administered 2015-07-20 – 2015-07-21 (×2): 5 mg via ORAL
  Filled 2015-07-18 (×3): qty 1

## 2015-07-18 MED ORDER — CYCLOSPORINE 0.05 % OP EMUL
1.0000 [drp] | Freq: Two times a day (BID) | OPHTHALMIC | Status: DC
  Administered 2015-07-18 – 2015-07-21 (×7): 1 [drp] via OPHTHALMIC
  Filled 2015-07-18 (×9): qty 1

## 2015-07-18 MED ORDER — B COMPLEX VITAMINS PO CAPS: 1.0000 | ORAL_CAPSULE | Freq: Every day | ORAL | Status: DC

## 2015-07-18 MED ORDER — FUROSEMIDE 20 MG PO TABS
20.0000 mg | ORAL_TABLET | Freq: Every day | ORAL | Status: DC
  Administered 2015-07-20 – 2015-07-21 (×2): 20 mg via ORAL
  Filled 2015-07-18 (×3): qty 1

## 2015-07-18 MED ORDER — SODIUM CHLORIDE 0.9% FLUSH
3.0000 mL | Freq: Two times a day (BID) | INTRAVENOUS | Status: DC
  Administered 2015-07-18 – 2015-07-21 (×5): 3 mL via INTRAVENOUS

## 2015-07-18 MED ORDER — NIACIN ER (ANTIHYPERLIPIDEMIC) 500 MG PO TBCR
500.0000 mg | EXTENDED_RELEASE_TABLET | Freq: Every day | ORAL | Status: DC
  Administered 2015-07-18 – 2015-07-21 (×4): 500 mg via ORAL
  Filled 2015-07-18 (×4): qty 1

## 2015-07-18 MED ORDER — CALCIUM GLUCONATE 500 MG PO TABS
1.0000 | ORAL_TABLET | Freq: Every day | ORAL | Status: DC
  Administered 2015-07-18: 500 mg via ORAL
  Filled 2015-07-18 (×2): qty 1

## 2015-07-18 MED ORDER — SODIUM CHLORIDE 0.9% FLUSH: 3.0000 mL | INTRAVENOUS | Status: DC | PRN

## 2015-07-18 MED ORDER — BIOTIN 2500 MCG PO CAPS: 2500.0000 ug | ORAL_CAPSULE | Freq: Two times a day (BID) | ORAL | Status: DC

## 2015-07-18 MED ORDER — POTASSIUM CHLORIDE CRYS ER 10 MEQ PO TBCR
10.0000 meq | EXTENDED_RELEASE_TABLET | Freq: Two times a day (BID) | ORAL | Status: DC
  Administered 2015-07-18 – 2015-07-21 (×6): 10 meq via ORAL
  Filled 2015-07-18 (×7): qty 1

## 2015-07-18 MED ORDER — ACETAMINOPHEN 650 MG RE SUPP: 650.0000 mg | Freq: Four times a day (QID) | RECTAL | Status: DC | PRN

## 2015-07-18 MED ORDER — PANTOPRAZOLE SODIUM 40 MG PO TBEC
40.0000 mg | DELAYED_RELEASE_TABLET | Freq: Every day | ORAL | Status: DC
  Administered 2015-07-18 – 2015-07-21 (×3): 40 mg via ORAL
  Filled 2015-07-18 (×4): qty 1

## 2015-07-18 MED ORDER — PROMETHAZINE HCL 25 MG PO TABS: 12.5000 mg | ORAL_TABLET | Freq: Four times a day (QID) | ORAL | Status: DC | PRN

## 2015-07-18 MED ORDER — ACETAMINOPHEN 325 MG PO TABS
650.0000 mg | ORAL_TABLET | Freq: Four times a day (QID) | ORAL | Status: DC | PRN
  Administered 2015-07-19 – 2015-07-21 (×4): 650 mg via ORAL
  Filled 2015-07-18 (×4): qty 2

## 2015-07-18 MED ORDER — SODIUM CHLORIDE 0.9% FLUSH
3.0000 mL | Freq: Two times a day (BID) | INTRAVENOUS | Status: DC
  Administered 2015-07-18 – 2015-07-21 (×4): 3 mL via INTRAVENOUS

## 2015-07-18 NOTE — H&P (Signed)
Referring Physician:  [name] is an 70 y.o. female.                       Chief Complaint: Chest pain and weakness  HPI: 70 year old female felt like bubble on chest lasting for 20-30 minutes 5 days ago. Now has weakness, chills, cough for 4 days. PMH positive for diabetes x 21 years, Hypertension x 23 years, dyslipidemia and obesity. Cardiac catheterization 12 years ago showed mild coronary artery [name].   Past Medical History  Diagnosis Date  . Hypertension   . Pure hypercholesterolemia   . CKD stage 2 due to type 2 diabetes mellitus (HCC)   . Benign hypertensive renal [name]   . Obesity   . Hypothyroidism   . Vitamin D deficiency   . PVD (peripheral vascular [name]) [facility])       Past Surgical History  Procedure Laterality Date  . Thyroidectomy    . Abdominal hysterectomy    . Right and left rotator cuff    . Broken leg and displaced ankle      Family History  Problem Relation Age of Onset  . Alzheimer'[name] Mother   . Aneurysm Father   . Cancer Brother    Social History:  reports that she has never smoked. She does not have any smokeless tobacco history on file. She reports that she does not drink alcohol or use illicit drugs.  Allergies:  Allergies  Allergen Reactions  . Atorvastatin   . Metoprolol Succinate Er     Pt states that she is unsure of this one  . Rosuvastatin Calcium     Medications Prior to Admission  Medication Sig Dispense Refill  . amLODipine-valsartan (EXFORGE) 5-320 MG tablet Take 1 tablet by mouth daily.    [name] aspirin 81 MG tablet Take 81 mg by mouth daily.    [name] b complex vitamins capsule Take 1 capsule by mouth daily.    . Biotin 2500 MCG CAPS Take 2,500 mcg by mouth 2 (two) times daily.    . calcium gluconate 500 MG tablet Take 1 tablet by mouth daily.    . Chlorphen-PE-Acetaminophen (NOREL AD) 4-10-325 MG TABS Take by mouth.    . Cholecalciferol 4000 units CAPS Take 4,000 Units by mouth.    . cilostazol (PLETAL) 100 MG  tablet Take 100 mg by mouth 2 (two) times daily.    . Cinnamon 500 MG capsule Take 500 mg by mouth daily.    . CYANOCOBALAMIN PO Take by mouth.    . cycloSPORINE (RESTASIS) 0.05 % ophthalmic emulsion 1 drop 2 (two) times daily.    . Doxepin HCl (SILENOR) 3 MG TABS Take 3 mg by mouth.    . etodolac (LODINE) 400 MG tablet Take 400 mg by mouth 2 (two) times daily.    . furosemide (LASIX) 20 MG tablet Take 20 mg by mouth.    . insulin glargine (LANTUS) 100 UNIT/ML injection Inject 26 Units into the skin at bedtime.    . isosorbide mononitrate (IMDUR) 60 MG 24 hr tablet Take 60 mg by mouth daily.    [name] levothyroxine (SYNTHROID, LEVOTHROID) 125 MCG tablet Take 125 mcg by mouth daily before breakfast.    . metoprolol succinate (TOPROL-XL) 25 MG 24 hr tablet Take 25 mg by mouth daily.    . niacin (NIASPAN) 500 MG CR tablet Take 500 mg by mouth at bedtime.    [name] omeprazole (PRILOSEC) 20 MG capsule Take 20 mg  by mouth daily.    . potassium chloride (K-DUR,KLOR-CON) 10 MEQ tablet Take 10 mEq by mouth 2 (two) times daily.    . simvastatin (ZOCOR) 20 MG tablet Take 20 mg by mouth daily.    . temazepam (RESTORIL) 15 MG capsule Take 15 mg by mouth at bedtime as needed for sleep.    . traMADol (ULTRAM) 50 MG tablet Take 50 mg by mouth every 6 (six) hours as needed.    . travoprost, benzalkonium, (TRAVATAN) 0.004 % ophthalmic solution 1 drop at bedtime.      No results found for this or any previous visit (from the past 48 hour([name])). No results found.  Review Of Systems The patient denies recent weight gain.  Wears glasses. No dentures.  No history of asthma.  Positive history of chest pain.Positive history of dizziness. No palpitations. No history of leg edema.  Positive history of hiatal hernia.Negative history of GI bleed, blood transfusion, hepatitis,  Negative for kidney stones, Negative for strokes, seizures. Positive history of psychiatric admissions for manic depression.  There were no  vitals taken for this visit.  Physical exam:  GENERAL: The patient is alert and oriented x3. WB and well nourished. HEENT: The patient is normocephalic, atraumatic. Has brown eyes. Conjunctivae pink. Sclerae white. NECK: No JVD. No carotid bruits. LUNGS: Clear bilaterally. CHEST WALL: Nontender on palpation. HEART: Normal S1, S2 with grade 3/6 systolic murmur in her left sternal border. ABDOMEN: Soft, distended. EXTREMITIES: Trace edema. CNS: Cranial nerves grossly intact. The patient as bilateral equal grips.  Assessment/Plan Chest pain R/O MI CAD Weakness Hypertension Hypothyroidism Diabetes mellitus, II.  Admit/R/O MI./Home medications.  Ricki RodriguezKADAKIA,Catherine Picking S, MD  07/18/2015, 1:21 PM

## 2015-07-18 NOTE — Progress Notes (Signed)
Pt arrived to floor at 13:30 from primary MD's office. VS WNL, assessment per flowsheet. MD notified and pt oriented to room.

## 2015-07-18 NOTE — Progress Notes (Signed)
  Echocardiogram 2D Echocardiogram has been performed.  [name], [name] R 07/18/2015, 4:42 PM

## 2015-07-19 ENCOUNTER — Inpatient Hospital Stay (HOSPITAL_COMMUNITY): Payer: Medicare Other

## 2015-07-19 DIAGNOSIS — R0789 Other chest pain: Secondary | ICD-10-CM | POA: Diagnosis not present

## 2015-07-19 LAB — BASIC METABOLIC PANEL
Anion gap: 11 (ref 5–15)
BUN: 8 mg/dL (ref 6–20)
CHLORIDE: 106 mmol/L (ref 101–111)
CO2: 23 mmol/L (ref 22–32)
Calcium: 9.3 mg/dL (ref 8.9–10.3)
Creatinine, Ser: 1.09 mg/dL — ABNORMAL HIGH (ref 0.44–1.00)
GFR calc Af Amer: 58 mL/min — ABNORMAL LOW (ref >60)
GFR calc non Af Amer: 50 mL/min — ABNORMAL LOW (ref >60)
GLUCOSE: 126 mg/dL — AB (ref 65–99)
POTASSIUM: 3.9 mmol/L (ref 3.5–5.1)
Sodium: 140 mmol/L (ref 135–145)

## 2015-07-19 LAB — LIPID PANEL
CHOL/HDL RATIO: 3.5 ratio
CHOLESTEROL: 110 mg/dL (ref 0–200)
HDL: 31 mg/dL — AB (ref >40)
LDL Cholesterol: 60 mg/dL (ref 0–99)
Triglycerides: 93 mg/dL (ref <150)
VLDL: 19 mg/dL (ref 0–40)

## 2015-07-19 LAB — CBC
HEMATOCRIT: 36.1 % (ref 36.0–46.0)
Hemoglobin: 11.8 g/dL — ABNORMAL LOW (ref 12.0–15.0)
MCH: 29.1 pg (ref 26.0–34.0)
MCHC: 32.7 g/dL (ref 30.0–36.0)
MCV: 88.9 fL (ref 78.0–100.0)
Platelets: 214 10*3/uL (ref 150–400)
RBC: 4.06 MIL/uL (ref 3.87–5.11)
RDW: 12.7 % (ref 11.5–15.5)
WBC: 6 10*3/uL (ref 4.0–10.5)

## 2015-07-19 LAB — HEMOGLOBIN A1C
Hgb A1c MFr Bld: 7 % — ABNORMAL HIGH (ref 4.8–5.6)
Mean Plasma Glucose: 154 mg/dL

## 2015-07-19 LAB — PROTIME-INR
INR: 1.13 (ref 0.00–1.49)
Prothrombin Time: 14.6 seconds (ref 11.6–15.2)

## 2015-07-19 LAB — GLUCOSE, CAPILLARY
GLUCOSE-CAPILLARY: 109 mg/dL — AB (ref 65–99)
Glucose-Capillary: 156 mg/dL — ABNORMAL HIGH (ref 65–99)

## 2015-07-19 LAB — TROPONIN I: Troponin I: 0.03 ng/mL (ref <0.031)

## 2015-07-19 MED ORDER — TECHNETIUM TC 99M SESTAMIBI GENERIC - CARDIOLITE
30.0000 | Freq: Once | INTRAVENOUS | Status: AC | PRN
  Administered 2015-07-19: 30 via INTRAVENOUS

## 2015-07-19 MED ORDER — ALBUTEROL SULFATE (2.5 MG/3ML) 0.083% IN NEBU
3.0000 mL | INHALATION_SOLUTION | Freq: Four times a day (QID) | RESPIRATORY_TRACT | Status: DC
  Administered 2015-07-19: 3 mL via RESPIRATORY_TRACT
  Filled 2015-07-19: qty 3

## 2015-07-19 MED ORDER — CALCIUM CARBONATE 1250 MG/5ML PO SUSP
50.0000 mg | Freq: Every day | ORAL | Status: DC
  Administered 2015-07-20 – 2015-07-21 (×2): 50 mg via ORAL
  Filled 2015-07-19 (×4): qty 5

## 2015-07-19 MED ORDER — ALBUTEROL SULFATE (2.5 MG/3ML) 0.083% IN NEBU
3.0000 mL | INHALATION_SOLUTION | RESPIRATORY_TRACT | Status: DC | PRN
  Administered 2015-07-20: 3 mL via RESPIRATORY_TRACT
  Filled 2015-07-19: qty 3

## 2015-07-19 MED ORDER — AZITHROMYCIN 500 MG PO TABS
500.0000 mg | ORAL_TABLET | Freq: Every day | ORAL | Status: AC
  Administered 2015-07-19: 500 mg via ORAL
  Filled 2015-07-19: qty 1

## 2015-07-19 MED ORDER — GUAIFENESIN-DM 100-10 MG/5ML PO SYRP
10.0000 mL | ORAL_SOLUTION | ORAL | Status: DC
Start: 2015-07-19 — End: 2015-07-22
  Administered 2015-07-19 – 2015-07-22 (×14): 10 mL via ORAL
  Filled 2015-07-19 (×14): qty 10

## 2015-07-19 MED ORDER — REGADENOSON 0.4 MG/5ML IV SOLN
0.4000 mg | Freq: Once | INTRAVENOUS | Status: AC
  Administered 2015-07-19: 0.4 mg via INTRAVENOUS
  Filled 2015-07-19: qty 5

## 2015-07-19 MED ORDER — AZITHROMYCIN 250 MG PO TABS
250.0000 mg | ORAL_TABLET | Freq: Every day | ORAL | Status: DC
  Administered 2015-07-20 – 2015-07-21 (×2): 250 mg via ORAL
  Filled 2015-07-19 (×3): qty 1

## 2015-07-19 MED ORDER — TECHNETIUM TC 99M SESTAMIBI GENERIC - CARDIOLITE
10.0000 | Freq: Once | INTRAVENOUS | Status: AC | PRN
  Administered 2015-07-19: 10 via INTRAVENOUS

## 2015-07-19 MED ORDER — REGADENOSON 0.4 MG/5ML IV SOLN
INTRAVENOUS | Status: AC
  Administered 2015-07-19: 0.4 mg via INTRAVENOUS
  Filled 2015-07-19: qty 5

## 2015-07-19 NOTE — Progress Notes (Signed)
Ref: [name], MD   Subjective:  Has cough. Mild fever. Abnormal nuclear stress test with reversible ischemia.  Objective:  Vital Signs in the last 24 hours: Temp:  [99.8 F (37.7 C)-100.8 F (38.2 C)] 100.5 F (38.1 C) (04/25 1500) Pulse Rate:  [67-108] 108 (04/25 1500) Cardiac Rhythm:  [-] Normal sinus rhythm;Bundle branch block (04/25 0724) Resp:  [18] 18 (04/25 1500) BP: (124-161)/(54-88) 124/67 mmHg (04/25 1500) SpO2:  [65 %-98 %] 98 % (04/25 1500) Weight:  [92.398 kg (203 lb 11.2 oz)] 92.398 kg (203 lb 11.2 oz) (04/25 0446)  Physical Exam: BP Readings from Last 1 Encounters:  07/19/15 124/67    Wt Readings from Last 1 Encounters:  07/19/15 92.398 kg (203 lb 11.2 oz)    Weight change:   HEENT: [place]/AT, Eyes-Brown, PERL, EOMI, Conjunctiva-Pink, Sclera-Non-icteric Neck: No JVD, No bruit, Trachea midline. Lungs:  Minimal wheezing on cough, Bilateral. Cardiac:  Regular rhythm, normal S1 and S2, no S3. III/VI systolic murmur. Abdomen:  Soft, non-tender. Extremities:  Trace edema present. No cyanosis. No clubbing. CNS: AxOx3, Cranial nerves grossly intact, moves all 4 extremities. Right handed. Skin: Warm and dry.   Intake/Output from previous day: 04/24 0701 - 04/25 0700 In: 240 [P.O.:240] Out: -     Lab Results: BMET    Component Value Date/Time   NA 140 07/19/2015 0502   NA 142 07/18/2015 1400   NA 143 05/31/2008 1128   K 3.9 07/19/2015 0502   K 4.1 07/18/2015 1400   K 4.0 05/31/2008 1128   CL 106 07/19/2015 0502   CL 106 07/18/2015 1400   CL 109 05/31/2008 1128   CO2 23 07/19/2015 0502   CO2 24 07/18/2015 1400   CO2 27 05/31/2008 1128   GLUCOSE 126* 07/19/2015 0502   GLUCOSE 108* 07/18/2015 1400   GLUCOSE 82 05/31/2008 1128   BUN 8 07/19/2015 0502   BUN 13 07/18/2015 1400   BUN 11 05/31/2008 1128   CREATININE 1.09* 07/19/2015 0502   CREATININE 1.13* 07/18/2015 1400   CREATININE 0.78 05/31/2008 1128   CALCIUM 9.3 07/19/2015 0502   CALCIUM 9.7  07/18/2015 1400   CALCIUM 9.9 05/31/2008 1128   GFRNONAA 50* 07/19/2015 0502   GFRNONAA 48* 07/18/2015 1400   GFRNONAA >60 05/31/2008 1128   GFRAA 58* 07/19/2015 0502   GFRAA 56* 07/18/2015 1400   GFRAA  05/31/2008 1128    >60        The eGFR has been calculated using the MDRD equation. This calculation has not been validated in all clinical situations. eGFR's persistently <60 mL/min signify possible Chronic Kidney Disease.   CBC    Component Value Date/Time   WBC 6.0 07/19/2015 0502   RBC 4.06 07/19/2015 0502   HGB 11.8* 07/19/2015 0502   HCT 36.1 07/19/2015 0502   PLT 214 07/19/2015 0502   MCV 88.9 07/19/2015 0502   MCH 29.1 07/19/2015 0502   MCHC 32.7 07/19/2015 0502   RDW 12.7 07/19/2015 0502   LYMPHSABS 1.8 07/18/2015 1400   MONOABS 0.5 07/18/2015 1400   EOSABS 0.0 07/18/2015 1400   BASOSABS 0.0 07/18/2015 1400   HEPATIC Function Panel  Recent Labs  07/18/15 1400  PROT 6.9   HEMOGLOBIN A1C No components found for: HGA1C,  MPG CARDIAC ENZYMES Lab Results  Component Value Date   CKTOTAL 143 09/18/2006   CKMB 2.3 09/18/2006   TROPONINI 0.03 07/19/2015   TROPONINI <0.03 07/18/2015   TROPONINI <0.03 07/18/2015   BNP No results for input(s): PROBNP  in the last 8760 hours. TSH  Recent Labs  07/18/15 1400  TSH 0.997   CHOLESTEROL  Recent Labs  07/19/15 0502  CHOL 110    Scheduled Meds: . albuterol  2 puff Inhalation Q6H  . amLODipine  5 mg Oral Daily   And  . irbesartan  300 mg Oral Daily  . aspirin  81 mg Oral Daily  . B-complex with vitamin C  1 tablet Oral Daily  . calcium carbonate (dosed in mg elemental calcium)  50 mg of elemental calcium Oral Daily  . cilostazol  100 mg Oral BID  . cycloSPORINE  1 drop Both Eyes BID  . docusate sodium  100 mg Oral BID  . furosemide  20 mg Oral Daily  . guaiFENesin-dextromethorphan  10 mL Oral Q4H WA  . insulin glargine  26 Units Subcutaneous QHS  . isosorbide mononitrate  60 mg Oral Daily  .  levothyroxine  125 mcg Oral QAC breakfast  . metoprolol succinate  25 mg Oral Daily  . niacin  500 mg Oral QHS  . pantoprazole  40 mg Oral Daily  . potassium chloride  10 mEq Oral BID  . sodium chloride flush  3 mL Intravenous Q12H  . sodium chloride flush  3 mL Intravenous Q12H   Continuous Infusions:  PRN Meds:.sodium chloride, acetaminophen **OR** acetaminophen, promethazine, sodium chloride flush, temazepam  Assessment/Plan: Acute bronchitis Chest pain CAD Weakness Hypertension Hypothyroidism  DM, II  Postpone cardiac cath. Z-pak + Albuterol + Robitussin.    LOS: 1 day    Dixie Dials  MD  07/19/2015, 7:37 PM

## 2015-07-20 ENCOUNTER — Telehealth: Payer: Self-pay | Admitting: Neurology

## 2015-07-20 DIAGNOSIS — R0789 Other chest pain: Secondary | ICD-10-CM | POA: Diagnosis not present

## 2015-07-20 LAB — GLUCOSE, CAPILLARY
GLUCOSE-CAPILLARY: 174 mg/dL — AB (ref 65–99)
Glucose-Capillary: 132 mg/dL — ABNORMAL HIGH (ref 65–99)
Glucose-Capillary: 151 mg/dL — ABNORMAL HIGH (ref 65–99)
Glucose-Capillary: 163 mg/dL — ABNORMAL HIGH (ref 65–99)

## 2015-07-20 NOTE — Telephone Encounter (Signed)
Pt called in due to being in hospital. She has not started using her CPAP and would like to know how to proceed. Please call and advise [phone].

## 2015-07-20 NOTE — Telephone Encounter (Signed)
Spoke to pt. I advised her that her cpap follow up is not until June so she has until the middle of May to start her cpap. Pt verbalized understanding. I offered pt my hope that she gets out of the hospital soon and that she feels better.

## 2015-07-20 NOTE — Progress Notes (Signed)
Ref: [name], MD   Subjective:  Mild fever and cough continues. Now on azithromycin, albuterol and cough suppressant.   Objective:  Vital Signs in the last 24 hours: Temp:  [99 F (37.2 C)-101.4 F (38.6 C)] 101.4 F (38.6 C) (04/26 1423) Pulse Rate:  [69-97] 81 (04/26 1423) Cardiac Rhythm:  [-] Normal sinus rhythm (04/26 0850) Resp:  [16-20] 18 (04/26 1423) BP: (121-148)/(46-75) 123/46 mmHg (04/26 1424) SpO2:  [95 %-97 %] 95 % (04/26 1423) Weight:  [92.1 kg (203 lb 0.7 oz)] 92.1 kg (203 lb 0.7 oz) (04/26 1478)  Physical Exam: BP Readings from Last 1 Encounters:  07/20/15 123/46    Wt Readings from Last 1 Encounters:  07/20/15 92.1 kg (203 lb 0.7 oz)    Weight change: -1.341 kg (-2 lb 15.3 oz)  HEENT: [place]/AT, Eyes-Brown, PERL, EOMI, Conjunctiva-Pink, Sclera-Non-icteric Neck: No JVD, No bruit, Trachea midline. Lungs:  Clearing, Bilateral. Cardiac:  Regular rhythm, normal S1 and S2, no S3. III/VI systolic murmur. Abdomen:  Soft, non-tender. Extremities:  No edema present. No cyanosis. No clubbing. CNS: AxOx3, Cranial nerves grossly intact, moves all 4 extremities. Right handed. Skin: Warm and dry.   Intake/Output from previous day:      Lab Results: BMET    Component Value Date/Time   NA 140 07/19/2015 0502   NA 142 07/18/2015 1400   NA 143 05/31/2008 1128   K 3.9 07/19/2015 0502   K 4.1 07/18/2015 1400   K 4.0 05/31/2008 1128   CL 106 07/19/2015 0502   CL 106 07/18/2015 1400   CL 109 05/31/2008 1128   CO2 23 07/19/2015 0502   CO2 24 07/18/2015 1400   CO2 27 05/31/2008 1128   GLUCOSE 126* 07/19/2015 0502   GLUCOSE 108* 07/18/2015 1400   GLUCOSE 82 05/31/2008 1128   BUN 8 07/19/2015 0502   BUN 13 07/18/2015 1400   BUN 11 05/31/2008 1128   CREATININE 1.09* 07/19/2015 0502   CREATININE 1.13* 07/18/2015 1400   CREATININE 0.78 05/31/2008 1128   CALCIUM 9.3 07/19/2015 0502   CALCIUM 9.7 07/18/2015 1400   CALCIUM 9.9 05/31/2008 1128   GFRNONAA 50*  07/19/2015 0502   GFRNONAA 48* 07/18/2015 1400   GFRNONAA >60 05/31/2008 1128   GFRAA 58* 07/19/2015 0502   GFRAA 56* 07/18/2015 1400   GFRAA  05/31/2008 1128    >60        The eGFR has been calculated using the MDRD equation. This calculation has not been validated in all clinical situations. eGFR's persistently <60 mL/min signify possible Chronic Kidney Disease.   CBC    Component Value Date/Time   WBC 6.0 07/19/2015 0502   RBC 4.06 07/19/2015 0502   HGB 11.8* 07/19/2015 0502   HCT 36.1 07/19/2015 0502   PLT 214 07/19/2015 0502   MCV 88.9 07/19/2015 0502   MCH 29.1 07/19/2015 0502   MCHC 32.7 07/19/2015 0502   RDW 12.7 07/19/2015 0502   LYMPHSABS 1.8 07/18/2015 1400   MONOABS 0.5 07/18/2015 1400   EOSABS 0.0 07/18/2015 1400   BASOSABS 0.0 07/18/2015 1400   HEPATIC Function Panel  Recent Labs  07/18/15 1400  PROT 6.9   HEMOGLOBIN A1C No components found for: HGA1C,  MPG CARDIAC ENZYMES Lab Results  Component Value Date   CKTOTAL 143 09/18/2006   CKMB 2.3 09/18/2006   TROPONINI 0.03 07/19/2015   TROPONINI <0.03 07/18/2015   TROPONINI <0.03 07/18/2015   BNP No results for input(s): PROBNP in the last 8760 hours. TSH  Recent Labs  07/18/15 1400  TSH 0.997   CHOLESTEROL  Recent Labs  07/19/15 0502  CHOL 110    Scheduled Meds: . amLODipine  5 mg Oral Daily   And  . irbesartan  300 mg Oral Daily  . aspirin  81 mg Oral Daily  . azithromycin  250 mg Oral Daily  . B-complex with vitamin C  1 tablet Oral Daily  . calcium carbonate (dosed in mg elemental calcium)  50 mg of elemental calcium Oral Daily  . cilostazol  100 mg Oral BID  . cycloSPORINE  1 drop Both Eyes BID  . docusate sodium  100 mg Oral BID  . furosemide  20 mg Oral Daily  . guaiFENesin-dextromethorphan  10 mL Oral Q4H WA  . insulin glargine  26 Units Subcutaneous QHS  . isosorbide mononitrate  60 mg Oral Daily  . levothyroxine  125 mcg Oral QAC breakfast  . metoprolol succinate   25 mg Oral Daily  . niacin  500 mg Oral QHS  . pantoprazole  40 mg Oral Daily  . potassium chloride  10 mEq Oral BID  . sodium chloride flush  3 mL Intravenous Q12H  . sodium chloride flush  3 mL Intravenous Q12H   Continuous Infusions:  PRN Meds:.sodium chloride, acetaminophen **OR** acetaminophen, albuterol, promethazine, sodium chloride flush, temazepam  Assessment/Plan: Acute bronchitis Chest pain CAD Weakness Hypertension Hypothyroidism  DM, II  Continue medical treatment.    LOS: 2 days    Dixie Dials  MD  07/20/2015, 4:22 PM

## 2015-07-21 ENCOUNTER — Encounter (HOSPITAL_COMMUNITY): Payer: Self-pay | Admitting: *Deleted

## 2015-07-21 DIAGNOSIS — R0789 Other chest pain: Secondary | ICD-10-CM | POA: Diagnosis not present

## 2015-07-21 LAB — GLUCOSE, CAPILLARY
GLUCOSE-CAPILLARY: 131 mg/dL — AB (ref 65–99)
GLUCOSE-CAPILLARY: 136 mg/dL — AB (ref 65–99)
Glucose-Capillary: 155 mg/dL — ABNORMAL HIGH (ref 65–99)

## 2015-07-21 NOTE — Care Management Important Message (Signed)
Important Message  Patient Details  Name: Sharlette DenseMarie B Ransier MRN: [id number] Date of Birth: 12-04-1945   Medicare Important Message Given:  Yes    Kyla BalzarineShealy, Takeira Yanes Abena 07/21/2015, 1:37 PM

## 2015-07-21 NOTE — Progress Notes (Signed)
Utilization review completed.  

## 2015-07-21 NOTE — Progress Notes (Signed)
This admission has been reviewed and determined not to meet inpatient level of care. Both attending Physician and Medical Director are in agreement this should be an Observation encounter according to the Medicare Conditions of Participation as set forth in CFR 42 Chapter 456 482.12 (c) and the Medicare Condition Code-44 Regulations CFR 42 Chapter 100 - 04 50.3. The Patient and/or Patient Representative was notified via delivery of the "MEDICARE OBSERVATION STATUS NOTIFICATION".              

## 2015-07-21 NOTE — Progress Notes (Signed)
Ref: [name], MD   Subjective:  Breathing improves. + cough with yellow sputum.  Objective:  Vital Signs in the last 24 hours: Temp:  [98.6 F (37 C)-101.4 F (38.6 C)] 100.4 F (38 C) (04/27 0436) Pulse Rate:  [77-84] 84 (04/27 0436) Cardiac Rhythm:  [-] Normal sinus rhythm (04/27 0745) Resp:  [18-20] 20 (04/27 0436) BP: (116-135)/(46-76) 135/76 mmHg (04/27 0436) SpO2:  [95 %-97 %] 96 % (04/27 0436) Weight:  [92.3 kg (203 lb 7.8 oz)] 92.3 kg (203 lb 7.8 oz) (04/27 0433)  Physical Exam: BP Readings from Last 1 Encounters:  07/21/15 135/76    Wt Readings from Last 1 Encounters:  07/21/15 92.3 kg (203 lb 7.8 oz)    Weight change: 0.2 kg (7.1 oz)  HEENT: [place]/AT, Eyes-Brown, PERL, EOMI, Conjunctiva-Pink, Sclera-Non-icteric Neck: No JVD, No bruit, Trachea midline. Lungs:  Clearing, Bilateral. Cardiac:  Regular rhythm, normal S1 and S2, no S3. III/VI systolic murmur. Abdomen:  Soft, non-tender. Extremities:  No edema present. No cyanosis. No clubbing. CNS: AxOx3, Cranial nerves grossly intact, moves all 4 extremities. Right handed. Skin: Warm and dry.   Intake/Output from previous day: 04/26 0701 - 04/27 0700 In: 240 [P.O.:240] Out: -     Lab Results: BMET    Component Value Date/Time   NA 140 07/19/2015 0502   NA 142 07/18/2015 1400   NA 143 05/31/2008 1128   K 3.9 07/19/2015 0502   K 4.1 07/18/2015 1400   K 4.0 05/31/2008 1128   CL 106 07/19/2015 0502   CL 106 07/18/2015 1400   CL 109 05/31/2008 1128   CO2 23 07/19/2015 0502   CO2 24 07/18/2015 1400   CO2 27 05/31/2008 1128   GLUCOSE 126* 07/19/2015 0502   GLUCOSE 108* 07/18/2015 1400   GLUCOSE 82 05/31/2008 1128   BUN 8 07/19/2015 0502   BUN 13 07/18/2015 1400   BUN 11 05/31/2008 1128   CREATININE 1.09* 07/19/2015 0502   CREATININE 1.13* 07/18/2015 1400   CREATININE 0.78 05/31/2008 1128   CALCIUM 9.3 07/19/2015 0502   CALCIUM 9.7 07/18/2015 1400   CALCIUM 9.9 05/31/2008 1128   GFRNONAA 50*  07/19/2015 0502   GFRNONAA 48* 07/18/2015 1400   GFRNONAA >60 05/31/2008 1128   GFRAA 58* 07/19/2015 0502   GFRAA 56* 07/18/2015 1400   GFRAA  05/31/2008 1128    >60        The eGFR has been calculated using the MDRD equation. This calculation has not been validated in all clinical situations. eGFR's persistently <60 mL/min signify possible Chronic Kidney Disease.   CBC    Component Value Date/Time   WBC 6.0 07/19/2015 0502   RBC 4.06 07/19/2015 0502   HGB 11.8* 07/19/2015 0502   HCT 36.1 07/19/2015 0502   PLT 214 07/19/2015 0502   MCV 88.9 07/19/2015 0502   MCH 29.1 07/19/2015 0502   MCHC 32.7 07/19/2015 0502   RDW 12.7 07/19/2015 0502   LYMPHSABS 1.8 07/18/2015 1400   MONOABS 0.5 07/18/2015 1400   EOSABS 0.0 07/18/2015 1400   BASOSABS 0.0 07/18/2015 1400   HEPATIC Function Panel  Recent Labs  07/18/15 1400  PROT 6.9   HEMOGLOBIN A1C No components found for: HGA1C,  MPG CARDIAC ENZYMES Lab Results  Component Value Date   CKTOTAL 143 09/18/2006   CKMB 2.3 09/18/2006   TROPONINI 0.03 07/19/2015   TROPONINI <0.03 07/18/2015   TROPONINI <0.03 07/18/2015   BNP No results for input(s): PROBNP in the last 8760 hours. TSH  Recent Labs  07/18/15 1400  TSH 0.997   CHOLESTEROL  Recent Labs  07/19/15 0502  CHOL 110    Scheduled Meds: . amLODipine  5 mg Oral Daily   And  . irbesartan  300 mg Oral Daily  . aspirin  81 mg Oral Daily  . azithromycin  250 mg Oral Daily  . B-complex with vitamin C  1 tablet Oral Daily  . calcium carbonate (dosed in mg elemental calcium)  50 mg of elemental calcium Oral Daily  . cilostazol  100 mg Oral BID  . cycloSPORINE  1 drop Both Eyes BID  . docusate sodium  100 mg Oral BID  . furosemide  20 mg Oral Daily  . guaiFENesin-dextromethorphan  10 mL Oral Q4H WA  . insulin glargine  26 Units Subcutaneous QHS  . isosorbide mononitrate  60 mg Oral Daily  . levothyroxine  125 mcg Oral QAC breakfast  . metoprolol succinate   25 mg Oral Daily  . niacin  500 mg Oral QHS  . pantoprazole  40 mg Oral Daily  . potassium chloride  10 mEq Oral BID  . sodium chloride flush  3 mL Intravenous Q12H  . sodium chloride flush  3 mL Intravenous Q12H   Continuous Infusions:  PRN Meds:.sodium chloride, acetaminophen **OR** acetaminophen, albuterol, promethazine, sodium chloride flush, temazepam  Assessment/Plan: Acute bronchitis Chest pain CAD Weakness Hypertension Hypothyroidism  DM, II  Continue medical treatment.     LOS: 3 days    Dixie Dials  MD  07/21/2015, 9:32 AM

## 2015-07-22 DIAGNOSIS — R0789 Other chest pain: Secondary | ICD-10-CM | POA: Diagnosis not present

## 2015-07-22 LAB — GLUCOSE, CAPILLARY: GLUCOSE-CAPILLARY: 90 mg/dL (ref 65–99)

## 2015-07-22 MED ORDER — LEVOFLOXACIN 500 MG PO TABS
500.0000 mg | ORAL_TABLET | Freq: Every day | ORAL | Status: DC
  Filled 2015-07-22: qty 1

## 2015-07-22 MED ORDER — POTASSIUM CHLORIDE CRYS ER 20 MEQ PO TBCR: 20.0000 meq | EXTENDED_RELEASE_TABLET | Freq: Every day | ORAL | Status: DC

## 2015-07-22 MED ORDER — ALBUTEROL SULFATE HFA 108 (90 BASE) MCG/ACT IN AERS: 2.0000 | INHALATION_SPRAY | Freq: Four times a day (QID) | RESPIRATORY_TRACT | Status: DC | PRN

## 2015-07-22 MED ORDER — LEVOFLOXACIN 500 MG PO TABS: 500.0000 mg | ORAL_TABLET | Freq: Every day | ORAL | Status: DC

## 2015-07-22 NOTE — Discharge Summary (Signed)
Physician Discharge Summary  Patient ID: [name] MRN: [id number] DOB/AGE: 10/09/1945 70 y.o.  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 4 days before admission she had weakness, chills, cough with wheezing. PMH is positive for diabetes x 21 years, Hypertension x 23 years, dyslipidemia and obesity. Cardiac catheterization 12 years ago showed mild coronary artery disease. She underwent cardiac work up showing reversible ischemia of lateral wall. Her echocardiogram showed mild LVH with normal EF. Chest x-ray was negative for pneumonia. Her acute bronchitis and fever responded well to antibiotic and breathing treatments. Her cardiac catheterization will be done on OP basis. She was discharged home with follow up by primary care in 1 week and by me in 3 weeks.  Consults: cardiology  Significant Diagnostic Studies: labs: CBC was normal, BMET was near normal with elevated sugar of 108 and Creatinine of 1.13.. Hgb A1C was 7.0, TSH was 0.997.  EKG: SR, LVH.  Chest x-ray: No active disease.  Nuclear stress test: 1. Mild reversibility involving the distal lateral wall.  2. Normal left ventricular wall motion. 3. Left ventricular ejection fraction 66%. 4. Intermediate-risk stress test findings*.  Echocardiogram: - Left ventricle: The cavity size was normal. There was mild concentric hypertrophy. Systolic function was vigorous. The estimated ejection fraction was in the range of 65% to 70%. Wall motion was normal; there were no regional wall motion abnormalities. Doppler  parameters are consistent with abnormal left ventricular relaxation (grade 1 diastolic dysfunction). - Aortic valve: There was trivial regurgitation. - Mitral valve: Calcified annulus. There was mild regurgitation. - Left atrium: The atrium was mildly dilated. - Pulmonary arteries: Systolic pressure was mildly increased. PA peak pressure: 48 mm Hg (S).  Treatments: antibiotics: Levaquin and azithromycin with albuterol inhaler treatments. Cardiac meds: metoprolol, amlodipine, isosorbide, furosemide and Simvastatin.  Discharge Exam: Blood pressure 123/52, pulse 97, temperature 99.1 F (37.3 C), temperature source Oral, resp. rate 18, height  (1.575 m), weight 91.899 kg (202 lb 9.6 oz), SpO2 98 %. HEENT: [place]/AT, Eyes-Brown, PERL, EOMI, Conjunctiva-Pink, Sclera-Non-icteric Neck: No JVD, No bruit, Trachea midline. Lungs: Clearing with mild forced expiratory wheeze, Bilateral. Cardiac: Regular rhythm, normal S1 and S2, no S3. III/VI systolic murmur. Abdomen: Soft, non-tender. Extremities: No edema present. No cyanosis. No clubbing. CNS: AxOx3, Cranial nerves grossly intact, moves all 4 extremities. Right handed. Skin: Warm and dry.  Disposition: 01, Home or self care     Medication List    STOP taking these medications        etodolac 400 MG tablet  Commonly known as:  LODINE      TAKE these medications        albuterol 108 (90 Base) MCG/ACT inhaler  Commonly known as:  PROVENTIL HFA;VENTOLIN HFA  Inhale 2 puffs into the lungs every 6 (six) hours as needed for wheezing or shortness of breath.     amLODipine-valsartan 5-320 MG tablet  Commonly known as:  EXFORGE  Take 1 tablet by mouth daily.     aspirin 81 MG tablet  Take 81 mg by  mouth daily.     b complex vitamins capsule  Take 1 capsule by mouth 2 (two) times daily.     Biotin 2500 MCG Caps  Take 2,500 mcg by mouth 2 (two) times daily.     calcium gluconate 500 MG tablet  Take 1 tablet by mouth daily.      Cholecalciferol 4000 units Caps  Take 2,000 Units by mouth 2 (two) times daily.     cilostazol 100 MG tablet  Commonly known as:  PLETAL  Take 100 mg by mouth 2 (two) times daily.     Cinnamon 500 MG capsule  Take 500 mg by mouth daily.     CYANOCOBALAMIN PO  Take 1 tablet by mouth daily.     cycloSPORINE 0.05 % ophthalmic emulsion  Commonly known as:  RESTASIS  Place 1 drop into both eyes 2 (two) times daily.     furosemide 20 MG tablet  Commonly known as:  LASIX  Take 20 mg by mouth daily.     insulin glargine 100 UNIT/ML injection  Commonly known as:  LANTUS  Inject 26 Units into the skin daily.     isosorbide mononitrate 60 MG 24 hr tablet  Commonly known as:  IMDUR  Take 60 mg by mouth daily.     levofloxacin 500 MG tablet  Commonly known as:  LEVAQUIN  Take 1 tablet (500 mg total) by mouth daily.     levothyroxine 125 MCG tablet  Commonly known as:  SYNTHROID, LEVOTHROID  Take 125 mcg by mouth daily before breakfast.     metoprolol succinate 25 MG 24 hr tablet  Commonly known as:  TOPROL-XL  Take 25 mg by mouth daily.     niacin 500 MG CR tablet  Commonly known as:  NIASPAN  Take 500 mg by mouth at bedtime.     omeprazole 20 MG capsule  Commonly known as:  PRILOSEC  Take 20 mg by mouth daily.     potassium chloride SA 20 MEQ tablet  Commonly known as:  K-DUR,KLOR-CON  Take 1 tablet (20 mEq total) by mouth daily.     SILENOR 3 MG Tabs  Generic drug:  Doxepin HCl  Take 3 mg by mouth at bedtime as needed (sleep).     simvastatin 20 MG tablet  Commonly known as:  ZOCOR  Take 20 mg by mouth daily.     travoprost (benzalkonium) 0.004 % ophthalmic solution  Commonly known as:  TRAVATAN  Place 1 drop into both eyes at bedtime.           Follow-up Information    Follow up with Gwynneth AlimentSANDERS,ROBYN N, MD. Schedule an appointment as soon as possible for a visit in 1 week.   Specialty:  Internal Medicine   Contact information:   [address] [place] [address] [phone]       [facility] Lansky S, MD. Schedule an appointment as soon as possible for a visit in 3 weeks.   Specialty:  Cardiology   Contact information:   [address] [place] [address] [phone]       Signed: Ricki RodriguezKADAKIA,Chemeka Filice S 07/22/2015, 10:08 AM

## 2015-07-22 NOTE — Progress Notes (Signed)
Discharged to home with family office visits in place teaching done  

## 2015-07-27 NOTE — Telephone Encounter (Signed)
[name] with [facility] is calling to ask which location of [facility] was the CPAP order sent to.

## 2015-07-27 NOTE — Telephone Encounter (Signed)
Pt called said she called [facility] on Monday 07/25/15. Pt sts she has not heard back from them. Operator advised pt to touch base with [facility] again. She will call back if she doesn't hear anything. FYI

## 2015-07-27 NOTE — Telephone Encounter (Signed)
I spoke to Clydie BraunKaren at [facility]. She says that someone faxed the orders for cpap [facility]. It did not have a cover sheet, says its from Dr. Vickey Hugerohmeier, and is just a handwritten order. I advised Clydie BraunKaren that this did not come from me. I have already sent the orders to [facility] and I always use a [facility] cover sheet. [facility] has confirmed with me that they already have the orders for this pt. I asked Clydie BraunKaren to please fax me what was faxed to them so I can figure out what is going on.

## 2015-07-27 NOTE — Telephone Encounter (Signed)
I reached out to [facility] and asked them to give her a call.

## 2015-09-19 ENCOUNTER — Ambulatory Visit: Payer: Self-pay | Admitting: Neurology

## 2015-10-06 ENCOUNTER — Ambulatory Visit (INDEPENDENT_AMBULATORY_CARE_PROVIDER_SITE_OTHER): Payer: Medicare Other | Admitting: Neurology

## 2015-10-06 ENCOUNTER — Encounter: Payer: Self-pay | Admitting: Neurology

## 2015-10-06 VITALS — BP 130/84 | HR 68 | Resp 20 | Ht 62.0 in | Wt 197.0 lb

## 2015-10-06 DIAGNOSIS — G4700 Insomnia, unspecified: Secondary | ICD-10-CM

## 2015-10-06 DIAGNOSIS — I119 Hypertensive heart disease without heart failure: Secondary | ICD-10-CM

## 2015-10-06 DIAGNOSIS — G4730 Sleep apnea, unspecified: Secondary | ICD-10-CM | POA: Diagnosis not present

## 2015-10-06 DIAGNOSIS — Z9989 Dependence on other enabling machines and devices: Secondary | ICD-10-CM | POA: Insufficient documentation

## 2015-10-06 DIAGNOSIS — I1310 Hypertensive heart and chronic kidney disease without heart failure, with stage 1 through stage 4 chronic kidney disease, or unspecified chronic kidney disease: Secondary | ICD-10-CM | POA: Insufficient documentation

## 2015-10-06 DIAGNOSIS — G4733 Obstructive sleep apnea (adult) (pediatric): Secondary | ICD-10-CM

## 2015-10-06 DIAGNOSIS — I2510 Atherosclerotic heart disease of native coronary artery without angina pectoris: Secondary | ICD-10-CM | POA: Diagnosis not present

## 2015-10-06 NOTE — Patient Instructions (Signed)

## 2015-10-06 NOTE — Progress Notes (Signed)
SLEEP MEDICINE CLINIC   Provider:  Melvyn Novas, M D  Referring Provider: [name], MD Primary Care Physician:  [name], MD  Chief Complaint  Patient presents with  . Follow-up    cpap going well    HPI:  [name] is a 70 y.o. female , seen here as a referral  from Dr. Allyne Gee for a sleep evaluation,  This right handed aa female patient reports that her husband witnessed her snoring and she was referred for evaluation of sleep apnea. She wakes up frequently, complaining of fragmented sleep. Her sleep quality feels poor, she is not restored or refreshed .  "when I get really tired is when I cannot sleep" " I am easily startled-Jumpy- and anxious ", her insomnia  problems are present for more than 10 years.  Mrs. Lakeman's physician also noted that the patient may have this ongoing sleep problems were longer than a decade, and that she has the comorbidities of chronic kidney disease stage II, hypertensive renal disease, hypercholesterolemia, diabetes and renal disease. Obesity, hypothyroidism as well as gastroesophageal reflux disease. She is currently taking calcium, aspirin, etodolac, as needed nitroglycerin sublingual, vitamin D, Prilosec, Restasis eyedrops, magnesium over-the-counter, biotin over-the-counter, metoprolol extended release, Travatan eyedrops, Tramadol  as needed for pain, seroquel 100 mg at night for sleep. Niaspan normal Klor-Con, and isosorbide mononitrate 60 mg ER, Synthroid 125 g, vitamin D and folic acid supplement. She had thyoidectomy in 1979  Sleep habits are as follows:  She goes to bed at 4 AM, that is until January when she changed the time to 11 PM, but she can only sleep when the TV is running in the background. She may go to sleep within 30 -60 minutes , only with seroquel. She reports that her feet tingle and that also prolongs her sleep latency she feels the irresistible urge to move either walk or massage or rub her legs. With the help  of Seroquel she will sleep for about 4-5 hours sometimes even 6. Without she is either unable to enter sleep or maintain sleep at all. She keeps her bedroom cool with a fan running, she usually switches the TV off by sleep timer, it is dark after that. Her husband has told her that she snores but she is probably equally disturbed by which she reports to be his sleeping pattern with louder snoring, significant apneas, and her husband often wakes her when he comes to the bedroom later than she. The patient sleeps on 2 pillows on her stomach.When she wakes she is usually still on her stomach, avoiding back pain. She wake sometimes with headaches, but is never woken by pain.  She does not nap in daytime.Sleep medical history and family sleep history: Social history: married, no tobacco use ever, ETOH none, caffeine - 2 cups a day, in AM. No sodas or Iced tea, but green tea once or twice a week. . 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   This patient my be best served with a HST, but I am not dure about her insurance guideline.   Will order a in lab test, with special attention to hypoxemia, and to hypercapnia.   If no sleep disorder is identified, return to PCP.  Interval history from 10/06/2015 Mrs. Wunschel is following up here today after a polysomnography on 06/07/2015 showed an AHI of 42.6. She slept all night in supine position she had a brief period of REM sleep with an AHI of 54. Based on  the severe sleep apnea her insomnia was attributed to the untreated apnea. She also had significant and prolonged desaturation of oxygen, the total hypoxemia time was 105.6 minutes CO2 was not retained. She had few PLM arousals. She returned for CPAP titration on 07/05/2015 and started on auto titration between 5 and 12 cm water with the use of an ResMed nasal pillow P 10 in medium size. [name] has used CPAP now 100% of the last 90 days, for the last 30 days 100% over 4 hours with an  average user time of 7 hours and 40 minutes, and a residual AHI of 2.4 there are no major air leaks, and her 95th percentile pressure is 11.1. She does not have central apneas. There has to be no adjustments made. She has reported that her nostrils get very sore from the nasal pillow and she may need another interface to alternate. I  like to ask for a an 20 gas and nasal mask to allow her an alternative.  She reported to me that our [facility] filed the cost for the CPAP device with her insurance, date of service was 09-01-15 and that [employer] [place] denied payment for auto PAP, right away. I request the insurance to cover. I will be happy to provide a letter on the patient's behalf.  It states that the payment of medical services ordered appliances was denied due to  a prior authorization not being obtained?   Review of Systems: Out of a complete 14 system review, the patient complains of only the following symptoms, and all other reviewed systems are negative. Epworth score  0, Fatigue severity score 9  , depression score deferred.    Social History   Social History  . Marital Status: Married    Spouse Name: N/A  . Number of Children: N/A  . Years of Education: N/A   Occupational History  . retired    Social History Main Topics  . Smoking status: Never Smoker   . Smokeless tobacco: Not on file  . Alcohol Use: No  . Drug Use: No  . Sexual Activity: Not on file   Other Topics Concern  . Not on file   Social History Narrative   Drinks 1-2 cups daily of coffee daily.    Family History  Problem Relation Age of Onset  . Alzheimer's disease Mother   . Aneurysm Father   . Cancer Brother     Past Medical History  Diagnosis Date  . Hypertension   . Pure hypercholesterolemia   . CKD stage 2 due to type 2 diabetes mellitus (HCC)   . Benign hypertensive renal disease   . Obesity   . Hypothyroidism   . Vitamin D deficiency   . PVD (peripheral vascular  disease) [facility](HCC)     Past Surgical History  Procedure Laterality Date  . Thyroidectomy    . Abdominal hysterectomy    . Right and left rotator cuff    . Broken leg and displaced ankle      Current Outpatient Prescriptions  Medication Sig Dispense Refill  . albuterol (PROVENTIL HFA;VENTOLIN HFA) 108 (90 Base) MCG/ACT inhaler Inhale 2 puffs into the lungs every 6 (six) hours as needed for wheezing or shortness of breath. 1 Inhaler 2  . amLODipine-valsartan (EXFORGE) 5-320 MG tablet Take 1 tablet by mouth daily.    [name]. aspirin 81 MG tablet Take 81 mg by mouth daily.    [name]. b complex vitamins capsule Take 1  capsule by mouth 2 (two) times daily.     . Biotin 2500 MCG CAPS Take 2,500 mcg by mouth 2 (two) times daily.    . calcium gluconate 500 MG tablet Take 1 tablet by mouth daily.    . Cholecalciferol 4000 units CAPS Take 2,000 Units by mouth 2 (two) times daily.     . cilostazol (PLETAL) 100 MG tablet Take 100 mg by mouth 2 (two) times daily.    . Cinnamon 500 MG capsule Take 500 mg by mouth daily.    . CYANOCOBALAMIN PO Take 1 tablet by mouth daily.     . cycloSPORINE (RESTASIS) 0.05 % ophthalmic emulsion Place 1 drop into both eyes 2 (two) times daily.     [name] dexamethasone (DECADRON) 4 MG tablet Take 2 mg by mouth 2 (two) times daily with a meal.    . Doxepin HCl (SILENOR) 3 MG TABS Take 3 mg by mouth at bedtime as needed (sleep).     [name] etodolac (LODINE) 400 MG tablet   2  . furosemide (LASIX) 20 MG tablet Take 20 mg by mouth daily.     . insulin glargine (LANTUS) 100 UNIT/ML injection Inject 26 Units into the skin daily.     . isosorbide mononitrate (IMDUR) 60 MG 24 hr tablet Take 60 mg by mouth daily.    [name] levofloxacin (LEVAQUIN) 500 MG tablet Take 1 tablet (500 mg total) by mouth daily. 7 tablet 0  . levothyroxine (SYNTHROID, LEVOTHROID) 125 MCG tablet Take 125 mcg by mouth daily before breakfast.    . metoprolol succinate (TOPROL-XL) 25 MG 24 hr tablet Take 25 mg by mouth daily.    .  niacin (NIASPAN) 500 MG CR tablet Take 500 mg by mouth at bedtime.    . norelgestromin-ethinyl estradiol (ORTHO EVRA) 150-35 MCG/24HR transdermal patch Place 1 patch onto the skin once a week.    [name] omeprazole (PRILOSEC) 20 MG capsule Take 20 mg by mouth daily.    . potassium chloride (K-DUR,KLOR-CON) 20 MEQ tablet Take 1 tablet (20 mEq total) by mouth daily.    . simvastatin (ZOCOR) 20 MG tablet Take 20 mg by mouth daily.    . traMADol (ULTRAM) 50 MG tablet Take by mouth every 6 (six) hours as needed.    . travoprost, benzalkonium, (TRAVATAN) 0.004 % ophthalmic solution Place 1 drop into both eyes at bedtime.      No current facility-administered medications for this visit.    Allergies as of 10/06/2015 - Review Complete 10/06/2015  Allergen Reaction Noted  . Atorvastatin  05/05/2015  . Metoprolol succinate er  05/05/2015  . Rosuvastatin calcium  05/05/2015    Vitals: BP 130/84 mmHg  Pulse 68  Resp 20  Ht  (1.575 m)  Wt 197 lb (89.359 kg)  BMI 36.02 kg/m2 Last Weight:  Wt Readings from Last 1 Encounters:  10/06/15 197 lb (89.359 kg)   ZOX:WRUE mass index is 36.02 kg/(m^2).     Last Height:   Ht Readings from Last 1 Encounters:  10/06/15  (1.575 m)    Physical exam:  General: The patient is awake, alert and appears not in acute distress. The patient is well groomed. Head: Normocephalic, atraumatic. Neck is supple. Mallampati 4,  neck circumference:15. Nasal airflow unrestricted , TMJ is not evident . Retrognathia is seen.  Cardiovascular:  Regular rate and rhythm , without  murmurs or carotid bruit, and without distended neck veins. Respiratory: Lungs are clear to auscultation. Skin:  Without evidence  of edema, or rash Trunk:  obesity  Neurologic exam : The patient is awake and alert, oriented to place and time.   Memory subjective described as intact.  Attention span & concentration ability appears normal.  Speech is fluent,  without dysarthria, dysphonia or  aphasia.  Mood and affect are appropriate.  Cranial nerves: Pupils are equal and briskly reactive to light. Funduscopic exam deferred. Extraocular movements  in vertical and horizontal planes intact and without nystagmus.  Visual fields by finger perimetry are intact. Hearing to finger rub intact.   Facial sensation intact to fine touch.  Facial motor strength is symmetric and tongue and uvula move midline. Shoulder shrug was symmetrical.   Motor exam:  Normal tone, muscle bulk and symmetric strength in all extremities.  Sensory:  Fine touch, pinprick and vibration were tested in all extremities. Proprioception tested in the upper extremities was normal.  Coordination: ,Finger-to-nose maneuver - without evidence of ataxia, dysmetria or tremor.  Gait and station: Patient walks without assistive device and is able unassisted to climb up to the exam table. Strength within normal limits.  Stance is stable and normal. Turns with 4 Steps. Romberg testing is negative.  Deep tendon reflexes: in the  upper and lower extremities are symmetric and intact. Babinski maneuver response is attenuated , but  downgoing.  The patient was advised of the nature of the diagnosed sleep disorder , the treatment options and risks for general a health and wellness arising from not treating the condition.  I spent more than 40 minutes of face to face time with the patient. Greater than 50% of time was spent in counseling and coordination of care. We have discussed the diagnosis and differential and I answered the patient's questions.     Assessment:  After physical and neurologic examination, review of laboratory studies,  Personal review of imaging studies, reports of other /same  Imaging studies ,  Results of polysomnography/ neurophysiology testing and pre-existing records as far as provided in visit., my assessment is   1) snoring with chronic insomnia , but not hypersomnia - this has been majorly improved after  the sleep apnea was discovered and treated. The patient had severe sleep apnea, during REM sleep the AHI was 54 in supine sleep 43.5 and she had prolonged oxygen desaturations. She has been very compliant with CPAP and uses it every night for an average of 7 hours and 40 minutes her AHI has been reduced to 2.4. I do not understand how this therapy would not be covered.  2) racing thoughts, tingling and numbness in feet all prolong the sleep latency .  3) diabetic patient on Seroquel for sleep initiation- rises the risk for DM , for poor blood glucose control.  Treatment of apnea also helps diabetic patients to require less insulin, in her case it may help her to need less Seroquel, it will treat blood pressure and decrease his stroke risk,  atrial fibrillation can also be more effectively prevented by using CPAP. Her doctor was already able to increase the insulin units from 32 units to 24 units since she has been on CPAP. She has lost weight, about 19 pounds since being placed on CPAP. Her blood pressure has improved greatly as well. I will ask [employer] kindly to consider these positive effects for the patient's health and disease prevention.    Plan:  Treatment plan and additional workup :  Continue CPAP, we will support our [facility]'s claim the patient needs a continuous positive airway  pressure device. Rv in 12 month.    Cc Dr Aldine Contes Tacoya Altizer MD  10/06/2015   CC: [name], Md [address] [place] [address] [place], [place] [address]

## 2015-10-26 ENCOUNTER — Telehealth: Payer: Self-pay | Admitting: Neurology

## 2015-10-26 DIAGNOSIS — G4733 Obstructive sleep apnea (adult) (pediatric): Secondary | ICD-10-CM

## 2015-10-26 DIAGNOSIS — Z9989 Dependence on other enabling machines and devices: Secondary | ICD-10-CM

## 2015-10-26 NOTE — Addendum Note (Signed)
Addended by: Geronimo Running A on: 10/26/2015 11:49 AM   Modules accepted: Orders

## 2015-10-26 NOTE — Telephone Encounter (Signed)
I spoke to pt. She reports that Dr. Vickey Huger mentioned switching her mask to a nasal mask but she has not heard anything. I advised pt that I would send an order to [facility] asking them to please fit her for a new mask. Pt verbalized understanding.

## 2015-10-26 NOTE — Telephone Encounter (Signed)
Patient is calling and has questions about the nose mask that was to be ordered.

## 2015-11-02 ENCOUNTER — Ambulatory Visit: Payer: Medicare Other | Admitting: Neurology

## 2016-01-05 ENCOUNTER — Other Ambulatory Visit: Payer: Self-pay | Admitting: Internal Medicine

## 2016-01-05 DIAGNOSIS — Z1231 Encounter for screening mammogram for malignant neoplasm of breast: Secondary | ICD-10-CM

## 2016-01-30 ENCOUNTER — Ambulatory Visit
Admission: RE | Admit: 2016-01-30 | Discharge: 2016-01-30 | Disposition: A | Discharge status: Home / Self Care | Payer: Medicare Other | Source: Ambulatory Visit | Attending: Internal Medicine | Admitting: Internal Medicine

## 2016-01-30 DIAGNOSIS — Z1231 Encounter for screening mammogram for malignant neoplasm of breast: Secondary | ICD-10-CM

## 2016-10-15 ENCOUNTER — Ambulatory Visit (INDEPENDENT_AMBULATORY_CARE_PROVIDER_SITE_OTHER): Payer: Medicare Other | Admitting: Neurology

## 2016-10-15 ENCOUNTER — Encounter: Payer: Self-pay | Admitting: Neurology

## 2016-10-15 VITALS — BP 137/75 | HR 62 | Ht 62.0 in | Wt 199.0 lb

## 2016-10-15 DIAGNOSIS — G4733 Obstructive sleep apnea (adult) (pediatric): Secondary | ICD-10-CM

## 2016-10-15 DIAGNOSIS — Z9989 Dependence on other enabling machines and devices: Secondary | ICD-10-CM

## 2016-10-15 NOTE — Addendum Note (Signed)
Addended by: Melvyn NovasHMEIER, Meggin Ola on: 10/15/2016 09:50 AM   Modules accepted: Orders

## 2016-10-15 NOTE — Progress Notes (Signed)
SLEEP MEDICINE CLINIC   Provider:  Melvyn Novas, M D  Referring Provider: [name], MD Primary Care Physician:  [name], MD  Chief Complaint  Patient presents with  . Follow-up    Interval history from 10/15/2016. The patient had severe sleep apnea, during REM sleep the AHI was 54 in supine sleep 43.5 and she had prolonged oxygen desaturations. She has been very compliant with CPAP . I had the pleasure of seeing [name] today, who has been a compliant CPAP user for 93% of the last 30 days. She only used the machine one out of 30 days for less than 4 hours. Average user time is 6 hours and 41 minutes, she is continuing to use an AutoSet between 5 and 12 cm water with 3 cm EPR, residual AHI is only 2.4 with all apneas being obstructive. The 95th percentile pressure is 11.2 and she has moderate air leaks. It is possible that he could reduce the AHI even further by increasing the pressure window to 13 cm water. I will discuss with the patient what this means for her she has endorsed a very low fatigue severity and only 6 points on the Epworth sleepiness score, and only 2 out of 15 points on the depression score. CPAP therapy has benefited the patient. She agrees with increasing the pressure to 13 cm auto pap. She is using a nasal pillow- she did not like dream wear.  The patient has achieved some weight loss since she has been on CPAP, her hypertension has improved, her daytime alertness has improved. She no longer has insomnia.    HPI:  [name] is a 71 y.o. female , seen here as a referral  from Dr. Allyne Gee for a sleep evaluation, This right handed aa female patient reports that her husband witnessed her snoring and she was referred for evaluation of sleep apnea. She wakes up frequently, complaining of fragmented sleep. Her sleep quality feels poor, she is not restored or refreshed .  "when I get really tired is when I cannot sleep" " I am easily startled-Jumpy-  and anxious ", her insomnia  problems are present for more than 10 years.  [name]'s physician also noted that the patient may have this ongoing sleep problems were longer than a decade, and that she has the comorbidities of chronic kidney disease stage II, hypertensive renal disease, hypercholesterolemia, diabetes and renal disease. Obesity, hypothyroidism as well as gastroesophageal reflux disease. She is currently taking calcium, aspirin, etodolac, as needed nitroglycerin sublingual, vitamin D, Prilosec, Restasis eyedrops, magnesium over-the-counter, biotin over-the-counter, metoprolol extended release, Travatan eyedrops, Tramadol  as needed for pain, seroquel 100 mg at night for sleep. Niaspan normal Klor-Con, and isosorbide mononitrate 60 mg ER, Synthroid 125 g, vitamin D and folic acid supplement. She had thyoidectomy in 1979  Sleep habits are as follows:  She goes to bed at 4 AM, that is until January when she changed the time to 11 PM, but she can only sleep when the TV is running in the background. She may go to sleep within 30 -60 minutes , only with seroquel. She reports that her feet tingle and that also prolongs her sleep latency she feels the irresistible urge to move either walk or massage or rub her legs. With the help of Seroquel she will sleep for about 4-5 hours sometimes even 6. Without she is either unable to enter sleep or maintain sleep at all. She keeps her bedroom cool with a  fan running, she usually switches the TV off by sleep timer, it is dark after that. Her husband has told her that she snores but she is probably equally disturbed by which she reports to be his sleeping pattern with louder snoring, significant apneas, and her husband often wakes her when he comes to the bedroom later than she. The patient sleeps on 2 pillows on her stomach.When she wakes she is usually still on her stomach, avoiding back pain. She wake sometimes with headaches, but is never woken by pain.    She does not nap in daytime.Sleep medical history and family sleep history: Social history: married, no tobacco use ever, ETOH none, caffeine - 2 cups a day, in AM. No sodas or Iced tea, but green tea once or twice a week. . Retired form telephone operator, delayed sPublic librarianleep phase syndrome.    Interval history from 10/06/2015 [name] is following up here today after a polysomnography on 06/07/2015 showed an AHI of 42.6. She slept all night in supine position she had a brief period of REM sleep with an AHI of 54. Based on the severe sleep apnea her insomnia was attributed to the untreated apnea. She also had significant and prolonged desaturation of oxygen, the total hypoxemia time was 105.6 minutes CO2 was not retained. She had few PLM arousals. She returned for CPAP titration on 07/05/2015 and started on auto titration between 5 and 12 cm water with the use of an ResMed nasal pillow P 10 in medium size. [name] has used CPAP now 100% of the last 90 days, for the last 30 days 100% over 4 hours with an average user time of 7 hours and 40 minutes, and a residual AHI of 2.4 there are no major air leaks, and her 95th percentile pressure is 11.1. She does not have central apneas. There has to be no adjustments made. She has reported that her nostrils get very sore from the nasal pillow and she may need another interface to alternate. I  like to ask for a an 20 gas and nasal mask to allow her an alternative.  She reported to me that our [facility] filed the cost for the CPAP device with her insurance, date of service was 09-01-15 and that [employer] [place] denied payment for auto PAP, right away. I request the insurance to cover. I will be happy to provide a letter on the patient's behalf.  It states that the payment of medical services ordered appliances was denied due to  a prior authorization not being obtained?   Review of Systems: Out of a complete 14 system review, the patient  complains of only the following symptoms, and all other reviewed systems are negative.  no longer insomnia. She falls asleep and stays asleep.   Social History   Social History  . Marital status: Married    Spouse name: N/A  . Number of children: N/A  . Years of education: N/A   Occupational History  . retired    Social History Main Topics  . Smoking status: Never Smoker  . Smokeless tobacco: Never Used  . Alcohol use No  . Drug use: No  . Sexual activity: Not on file   Other Topics Concern  . Not on file   Social History Narrative   Drinks 1-2 cups daily of coffee daily.    Family History  Problem Relation Age of Onset  . Alzheimer's disease Mother   . Aneurysm Father   .  Cancer Brother     Past Medical History:  Diagnosis Date  . Benign hypertensive renal disease   . CKD stage 2 due to type 2 diabetes mellitus (HCC)   . Hypertension   . Hypothyroidism   . Obesity   . Pure hypercholesterolemia   . PVD (peripheral vascular disease) (HCC)   . Vitamin D deficiency     Past Surgical History:  Procedure Laterality Date  . ABDOMINAL HYSTERECTOMY    . broken leg and displaced ankle    . right and left rotator cuff    . THYROIDECTOMY      Current Outpatient Prescriptions  Medication Sig Dispense Refill  . albuterol (PROVENTIL HFA;VENTOLIN HFA) 108 (90 Base) MCG/ACT inhaler Inhale 2 puffs into the lungs every 6 (six) hours as needed for wheezing or shortness of breath. 1 Inhaler 2  . amLODipine-valsartan (EXFORGE) 5-320 MG tablet Take 1 tablet by mouth daily.    [name] aspirin 81 MG tablet Take 81 mg by mouth daily.    [name] b complex vitamins capsule Take 1 capsule by mouth 2 (two) times daily.     . Biotin 2500 MCG CAPS Take 2,500 mcg by mouth 2 (two) times daily.    . brimonidine (ALPHAGAN P) 0.1 % SOLN Place 1 drop into both eyes every 8 (eight) hours.    . calcium gluconate 500 MG tablet Take 1 tablet by mouth daily.    . Cholecalciferol 4000 units CAPS Take 2,000  Units by mouth 2 (two) times daily.     . cilostazol (PLETAL) 100 MG tablet Take 100 mg by mouth 2 (two) times daily.    . Cinnamon 500 MG capsule Take 500 mg by mouth daily.    . CYANOCOBALAMIN PO Take 1 tablet by mouth daily.     . fluocinonide gel (LIDEX) 0.05 % Apply 1 application topically 2 (two) times daily.    . furosemide (LASIX) 20 MG tablet Take 20 mg by mouth daily.     . insulin glargine (LANTUS) 100 UNIT/ML injection Inject 24 Units into the skin daily.     . isosorbide mononitrate (IMDUR) 60 MG 24 hr tablet Take 60 mg by mouth daily.    [name] levothyroxine (SYNTHROID, LEVOTHROID) 125 MCG tablet Take 125 mcg by mouth daily before breakfast.    . magnesium 30 MG tablet Take 30 mg by mouth 2 (two) times daily.    . Multiple Vitamins-Minerals (HAIR SKIN AND NAILS FORMULA) TABS Take 1 tablet by mouth daily.    . Multiple Vitamins-Minerals (MULTIVITAMIN WITH MINERALS) tablet Take 1 tablet by mouth daily.    [name] omega-3 acid ethyl esters (LOVAZA) 1 g capsule Take 1 g by mouth 2 (two) times daily.    . potassium chloride (K-DUR,KLOR-CON) 20 MEQ tablet Take 1 tablet (20 mEq total) by mouth daily.    . travoprost, benzalkonium, (TRAVATAN) 0.004 % ophthalmic solution Place 1 drop into both eyes at bedtime.      No current facility-administered medications for this visit.     Allergies as of 10/15/2016 - Review Complete 10/15/2016  Allergen Reaction Noted  . Atorvastatin  05/05/2015  . Metoprolol tartrate  05/05/2015  . Rosuvastatin calcium  05/05/2015    Vitals: BP 137/75   Pulse 62   Ht 5\' 2"  (1.575 m)   Wt 199 lb (90.3 kg)   BMI 36.40 kg/m  Last Weight:  Wt Readings from Last 1 Encounters:  10/15/16 199 lb (90.3 kg)   ZOX:WRUE mass index is 36.4  kg/m.     Last Height:   Ht Readings from Last 1 Encounters:  10/15/16 5\' 2"  (1.575 m)    Physical exam:  General: The patient is awake, alert and appears not in acute distress. The patient is well groomed. Head: Normocephalic,  atraumatic. Neck is supple. Mallampati 4,  neck circumference:15. Nasal airflow unrestricted , TMJ is not evident . Retrognathia is seen.  Cardiovascular:  Regular rate and rhythm , without  murmurs or carotid bruit, and without distended neck veins. Respiratory: Lungs are clear to auscultation. Skin:  Without evidence of edema, or rash Trunk:  obesity  Neurologic exam : The patient is awake and alert, oriented to place and time.   Memory subjective described as intact.  Attention span & concentration ability appears normal.  Speech is fluent,  without dysarthria, dysphonia or aphasia.  Mood and affect are appropriate.  Cranial nerves: Pupils are equal and briskly reactive to light. Visual fields by finger perimetry are intact. Hearing to finger rub intact.  Facial motor strength is symmetric and tongue and uvula move midline. Shoulder shrug was symmetrical.   Deep tendon reflexes: in the upper and lower extremities are symmetric and intact. Babinski maneuver response is attenuated , but  downgoing.  The patient was advised of the nature of the diagnosed sleep disorder , the treatment options and risks for general a health and wellness arising from not treating the condition.  I spent more than 20 minutes of face to face time with the patient. Greater than 50% of time was spent in counseling and coordination of care. We have discussed the diagnosis and differential and I answered the patient's questions.     Assessment:  After physical and neurologic examination, review of laboratory studies,  Personal review of imaging studies, reports of other /same  Imaging studies ,  Results of polysomnography/ neurophysiology testing and pre-existing records as far as provided in visit., my assessment is   1) OSA on CPAP>  2) no longer insomnia 3) weight is stabilized- she lost 25 pounds.  4) RLS -  Improved on CPAP, too.    Plan:  Treatment plan and additional workup :  Continue CPAP, we will  support our [facility]'s claim the patient needs a continuous positive airway pressure device. Rv in 12 month.    Cc Dr Aldine Contes Sena Hoopingarner MD  10/15/2016   CC: [name], Md [address] [place], [place] [address]

## 2016-12-17 ENCOUNTER — Other Ambulatory Visit: Payer: Self-pay | Admitting: Internal Medicine

## 2016-12-17 DIAGNOSIS — Z1231 Encounter for screening mammogram for malignant neoplasm of breast: Secondary | ICD-10-CM

## 2016-12-26 ENCOUNTER — Observation Stay (HOSPITAL_COMMUNITY): Payer: Medicare Other

## 2016-12-26 ENCOUNTER — Observation Stay (HOSPITAL_COMMUNITY)
Admission: AD | Admit: 2016-12-26 | Discharge: 2016-12-27 | Disposition: A | Discharge status: Home / Self Care | Payer: Medicare Other | Source: Ambulatory Visit | Attending: Cardiovascular Disease | Admitting: Cardiovascular Disease

## 2016-12-26 DIAGNOSIS — Z79899 Other long term (current) drug therapy: Secondary | ICD-10-CM | POA: Insufficient documentation

## 2016-12-26 DIAGNOSIS — R001 Bradycardia, unspecified: Secondary | ICD-10-CM | POA: Insufficient documentation

## 2016-12-26 DIAGNOSIS — Z794 Long term (current) use of insulin: Secondary | ICD-10-CM | POA: Diagnosis not present

## 2016-12-26 DIAGNOSIS — E785 Hyperlipidemia, unspecified: Secondary | ICD-10-CM | POA: Insufficient documentation

## 2016-12-26 DIAGNOSIS — M50322 Other cervical disc degeneration at C5-C6 level: Secondary | ICD-10-CM | POA: Diagnosis not present

## 2016-12-26 DIAGNOSIS — R079 Chest pain, unspecified: Secondary | ICD-10-CM | POA: Diagnosis present

## 2016-12-26 DIAGNOSIS — Z888 Allergy status to other drugs, medicaments and biological substances status: Secondary | ICD-10-CM | POA: Insufficient documentation

## 2016-12-26 DIAGNOSIS — M4802 Spinal stenosis, cervical region: Secondary | ICD-10-CM | POA: Insufficient documentation

## 2016-12-26 DIAGNOSIS — Z7982 Long term (current) use of aspirin: Secondary | ICD-10-CM | POA: Insufficient documentation

## 2016-12-26 DIAGNOSIS — Z23 Encounter for immunization: Secondary | ICD-10-CM | POA: Diagnosis not present

## 2016-12-26 DIAGNOSIS — Z6835 Body mass index (BMI) 35.0-35.9, adult: Secondary | ICD-10-CM | POA: Insufficient documentation

## 2016-12-26 DIAGNOSIS — I119 Hypertensive heart disease without heart failure: Secondary | ICD-10-CM | POA: Diagnosis present

## 2016-12-26 DIAGNOSIS — N182 Chronic kidney disease, stage 2 (mild): Secondary | ICD-10-CM | POA: Diagnosis not present

## 2016-12-26 DIAGNOSIS — E669 Obesity, unspecified: Secondary | ICD-10-CM | POA: Insufficient documentation

## 2016-12-26 DIAGNOSIS — E1151 Type 2 diabetes mellitus with diabetic peripheral angiopathy without gangrene: Secondary | ICD-10-CM | POA: Diagnosis not present

## 2016-12-26 DIAGNOSIS — E7800 Pure hypercholesterolemia, unspecified: Secondary | ICD-10-CM | POA: Insufficient documentation

## 2016-12-26 DIAGNOSIS — R0789 Other chest pain: Principal | ICD-10-CM | POA: Insufficient documentation

## 2016-12-26 DIAGNOSIS — I129 Hypertensive chronic kidney disease with stage 1 through stage 4 chronic kidney disease, or unspecified chronic kidney disease: Secondary | ICD-10-CM | POA: Insufficient documentation

## 2016-12-26 DIAGNOSIS — I2510 Atherosclerotic heart disease of native coronary artery without angina pectoris: Secondary | ICD-10-CM | POA: Diagnosis present

## 2016-12-26 DIAGNOSIS — E559 Vitamin D deficiency, unspecified: Secondary | ICD-10-CM | POA: Diagnosis not present

## 2016-12-26 DIAGNOSIS — I4510 Unspecified right bundle-branch block: Secondary | ICD-10-CM | POA: Insufficient documentation

## 2016-12-26 DIAGNOSIS — M4312 Spondylolisthesis, cervical region: Secondary | ICD-10-CM | POA: Insufficient documentation

## 2016-12-26 DIAGNOSIS — I1310 Hypertensive heart and chronic kidney disease without heart failure, with stage 1 through stage 4 chronic kidney disease, or unspecified chronic kidney disease: Secondary | ICD-10-CM | POA: Diagnosis present

## 2016-12-26 DIAGNOSIS — R200 Anesthesia of skin: Secondary | ICD-10-CM | POA: Insufficient documentation

## 2016-12-26 DIAGNOSIS — I700 Atherosclerosis of aorta: Secondary | ICD-10-CM | POA: Insufficient documentation

## 2016-12-26 DIAGNOSIS — I249 Acute ischemic heart disease, unspecified: Secondary | ICD-10-CM | POA: Diagnosis present

## 2016-12-26 DIAGNOSIS — Z90710 Acquired absence of both cervix and uterus: Secondary | ICD-10-CM | POA: Insufficient documentation

## 2016-12-26 DIAGNOSIS — E039 Hypothyroidism, unspecified: Secondary | ICD-10-CM | POA: Insufficient documentation

## 2016-12-26 DIAGNOSIS — E1122 Type 2 diabetes mellitus with diabetic chronic kidney disease: Secondary | ICD-10-CM | POA: Insufficient documentation

## 2016-12-26 DIAGNOSIS — R072 Precordial pain: Secondary | ICD-10-CM

## 2016-12-26 LAB — COMPREHENSIVE METABOLIC PANEL
ALK PHOS: 62 U/L (ref 38–126)
ALT: 25 U/L (ref 14–54)
ANION GAP: 10 (ref 5–15)
AST: 48 U/L — ABNORMAL HIGH (ref 15–41)
Albumin: 4.2 g/dL (ref 3.5–5.0)
BUN: 11 mg/dL (ref 6–20)
CALCIUM: 10.2 mg/dL (ref 8.9–10.3)
CO2: 24 mmol/L (ref 22–32)
Chloride: 107 mmol/L (ref 101–111)
Creatinine, Ser: 1.06 mg/dL — ABNORMAL HIGH (ref 0.44–1.00)
GFR calc Af Amer: 60 mL/min — ABNORMAL LOW (ref >60)
GFR, EST NON AFRICAN AMERICAN: 52 mL/min — AB (ref >60)
Glucose, Bld: 98 mg/dL (ref 65–99)
Potassium: 4.1 mmol/L (ref 3.5–5.1)
SODIUM: 141 mmol/L (ref 135–145)
TOTAL PROTEIN: 6.8 g/dL (ref 6.5–8.1)
Total Bilirubin: 0.5 mg/dL (ref 0.3–1.2)

## 2016-12-26 LAB — CBC WITH DIFFERENTIAL/PLATELET
BASOS ABS: 0 10*3/uL (ref 0.0–0.1)
BASOS PCT: 0 %
EOS ABS: 0.2 10*3/uL (ref 0.0–0.7)
EOS PCT: 5 %
HCT: 37.1 % (ref 36.0–46.0)
Hemoglobin: 12.2 g/dL (ref 12.0–15.0)
Lymphocytes Relative: 56 %
Lymphs Abs: 2.8 10*3/uL (ref 0.7–4.0)
MCH: 29.5 pg (ref 26.0–34.0)
MCHC: 32.9 g/dL (ref 30.0–36.0)
MCV: 89.8 fL (ref 78.0–100.0)
MONO ABS: 0.3 10*3/uL (ref 0.1–1.0)
Monocytes Relative: 6 %
NEUTROS ABS: 1.6 10*3/uL — AB (ref 1.7–7.7)
Neutrophils Relative %: 33 %
PLATELETS: 274 10*3/uL (ref 150–400)
RBC: 4.13 MIL/uL (ref 3.87–5.11)
RDW: 12.9 % (ref 11.5–15.5)
WBC: 4.9 10*3/uL (ref 4.0–10.5)

## 2016-12-26 LAB — HEMOGLOBIN A1C
Hgb A1c MFr Bld: 6.1 % — ABNORMAL HIGH (ref 4.8–5.6)
MEAN PLASMA GLUCOSE: 128.37 mg/dL

## 2016-12-26 LAB — PROTIME-INR
INR: 0.98
PROTHROMBIN TIME: 12.9 s (ref 11.4–15.2)

## 2016-12-26 LAB — APTT: APTT: 34 s (ref 24–36)

## 2016-12-26 LAB — TROPONIN I

## 2016-12-26 MED ORDER — HEPARIN (PORCINE) IN NACL 100-0.45 UNIT/ML-% IJ SOLN
850.0000 [IU]/h | INTRAMUSCULAR | Status: DC
  Administered 2016-12-26: 850 [IU]/h via INTRAVENOUS
  Filled 2016-12-26: qty 250

## 2016-12-26 MED ORDER — NITROGLYCERIN 0.4 MG SL SUBL: 0.4000 mg | SUBLINGUAL_TABLET | SUBLINGUAL | Status: DC | PRN

## 2016-12-26 MED ORDER — ONDANSETRON HCL 4 MG/2ML IJ SOLN: 4.0000 mg | Freq: Four times a day (QID) | INTRAMUSCULAR | Status: DC | PRN

## 2016-12-26 MED ORDER — PNEUMOCOCCAL VAC POLYVALENT 25 MCG/0.5ML IJ INJ
0.5000 mL | INJECTION | INTRAMUSCULAR | Status: AC
  Administered 2016-12-27: 0.5 mL via INTRAMUSCULAR
  Filled 2016-12-26: qty 0.5

## 2016-12-26 MED ORDER — HEPARIN BOLUS VIA INFUSION
4000.0000 [IU] | Freq: Once | INTRAVENOUS | Status: AC
  Administered 2016-12-26: 4000 [IU] via INTRAVENOUS
  Filled 2016-12-26: qty 4000

## 2016-12-26 MED ORDER — ASPIRIN EC 81 MG PO TBEC
81.0000 mg | DELAYED_RELEASE_TABLET | Freq: Every day | ORAL | Status: DC
  Administered 2016-12-27: 81 mg via ORAL
  Filled 2016-12-26: qty 1

## 2016-12-26 NOTE — Progress Notes (Addendum)
ANTICOAGULATION CONSULT NOTE - Initial Consult  Pharmacy Consult for heparin Indication: chest pain/ACS  Allergies  Allergen Reactions  . Atorvastatin   . Metoprolol Tartrate     Pt states that she is unsure of this one  . Rosuvastatin Calcium     Patient Measurements: Height:  (157.5 cm) Weight: 194 lb 11.2 oz (88.3 kg) IBW/kg (Calculated) : 50.1 Heparin Dosing Weight: 70.3 kg  Vital Signs: Temp Source: Oral (10/03 1813) BP: 162/73 (10/03 1827) Pulse Rate: 48 (10/03 1827)  Labs:  Ref. Range 12/26/2016 19:06  Hemoglobin Latest Ref Range: 12.0 - 15.0 g/dL 16.1  HCT Latest Ref Range: 36.0 - 46.0 % 37.1  MCV Latest Ref Range: 78.0 - 100.0 fL 89.8  MCH Latest Ref Range: 26.0 - 34.0 pg 29.5  MCHC Latest Ref Range: 30.0 - 36.0 g/dL 09.6  RDW Latest Ref Range: 11.5 - 15.5 % 12.9  Platelets Latest Ref Range: 150 - 400 K/uL 274   Prothrombin Time Latest Ref Range: 11.4 - 15.2 seconds 12.9  INR Unknown 0.98  APTT Latest Ref Range: 24 - 36 seconds 34    Medical History: Past Medical History:  Diagnosis Date  . Benign hypertensive renal disease   . CKD stage 2 due to type 2 diabetes mellitus (HCC)   . Hypertension   . Hypothyroidism   . Obesity   . Pure hypercholesterolemia   . PVD (peripheral vascular disease) (HCC)   . Vitamin D deficiency     Medications:  Scheduled:  . [START ON 12/27/2016] aspirin EC  81 mg Oral Daily  . heparin  4,000 Units Intravenous Once  . [START ON 12/27/2016] pneumococcal 23 valent vaccine  0.5 mL Intramuscular Tomorrow-1000    Assessment: 70 yof admitted with 2 week history of retrosternal chest pain and L arm numbness. Pharmacy consulted to dose heparin for rule out ACS. No anticoagulation prior to admission. CBC within normal limits. No signs/symptoms of bleeding noted in chart.   Goal of Therapy:  Heparin level 0.3-0.7 units/ml Monitor platelets by anticoagulation protocol: Yes   Plan:  Give 4000 units bolus x 1 Start heparin  infusion at 850 units/hr Check anti-Xa level in 8 hours and daily while on heparin Continue to monitor H&H and platelets  Girard Cooter, PharmD Clinical Pharmacist  Phone: [phone] 12/26/2016,6:38 PM

## 2016-12-26 NOTE — H&P (Signed)
Referring Physician:  DIESHA ROSTAD is an 71 y.o. female.                       Chief Complaint: Chest pain  HPI: 71 year old female with PMH of Hypertension, hypothyroidism, obesity, hyperlipidemia and type II DM has 2 weeks history of retrosternal burning chest pain and left arm numbness. No fever or cough.  Past Medical History:  Diagnosis Date  . Benign hypertensive renal disease   . CKD stage 2 due to type 2 diabetes mellitus (HCC)   . Hypertension   . Hypothyroidism   . Obesity   . Pure hypercholesterolemia   . PVD (peripheral vascular disease) (HCC)   . Vitamin D deficiency       Past Surgical History:  Procedure Laterality Date  . ABDOMINAL HYSTERECTOMY    . broken leg and displaced ankle    . right and left rotator cuff    . THYROIDECTOMY      Family History  Problem Relation Age of Onset  . Alzheimer's disease Mother   . Aneurysm Father   . Cancer Brother    Social History:  reports that she has never smoked. She has never used smokeless tobacco. She reports that she does not drink alcohol or use drugs.  Allergies:  Allergies  Allergen Reactions  . Atorvastatin   . Metoprolol Tartrate     Pt states that she is unsure of this one  . Rosuvastatin Calcium     Medications Prior to Admission  Medication Sig Dispense Refill  . albuterol (PROVENTIL HFA;VENTOLIN HFA) 108 (90 Base) MCG/ACT inhaler Inhale 2 puffs into the lungs every 6 (six) hours as needed for wheezing or shortness of breath. 1 Inhaler 2  . amLODipine-valsartan (EXFORGE) 5-320 MG tablet Take 1 tablet by mouth daily.    [name] aspirin 81 MG tablet Take 81 mg by mouth daily.    [name] b complex vitamins capsule Take 1 capsule by mouth 2 (two) times daily.     . Biotin 2500 MCG CAPS Take 2,500 mcg by mouth 2 (two) times daily.    . brimonidine (ALPHAGAN P) 0.1 % SOLN Place 1 drop into both eyes every 8 (eight) hours.    . calcium gluconate 500 MG tablet Take 1 tablet by mouth daily.    . Cholecalciferol  4000 units CAPS Take 2,000 Units by mouth 2 (two) times daily.     . cilostazol (PLETAL) 100 MG tablet Take 100 mg by mouth 2 (two) times daily.    . Cinnamon 500 MG capsule Take 500 mg by mouth daily.    . CYANOCOBALAMIN PO Take 1 tablet by mouth daily.     . fluocinonide gel (LIDEX) 0.05 % Apply 1 application topically 2 (two) times daily.    . furosemide (LASIX) 20 MG tablet Take 20 mg by mouth daily.     . insulin glargine (LANTUS) 100 UNIT/ML injection Inject 24 Units into the skin daily.     . isosorbide mononitrate (IMDUR) 60 MG 24 hr tablet Take 60 mg by mouth daily.    [name] levothyroxine (SYNTHROID, LEVOTHROID) 125 MCG tablet Take 125 mcg by mouth daily before breakfast.    . magnesium 30 MG tablet Take 30 mg by mouth 2 (two) times daily.    . Multiple Vitamins-Minerals (HAIR SKIN AND NAILS FORMULA) TABS Take 1 tablet by mouth daily.    . Multiple Vitamins-Minerals (MULTIVITAMIN WITH MINERALS) tablet Take 1 tablet by mouth  daily.    . omega-3 acid ethyl esters (LOVAZA) 1 g capsule Take 1 g by mouth 2 (two) times daily.    . potassium chloride (K-DUR,KLOR-CON) 20 MEQ tablet Take 1 tablet (20 mEq total) by mouth daily.    . travoprost, benzalkonium, (TRAVATAN) 0.004 % ophthalmic solution Place 1 drop into both eyes at bedtime.       No results found for this or any previous visit (from the past 48 hour(s)). No results found.  Review Of Systems Constitutional: No fever, chills, weight loss or gain. Eyes: No vision change, wears glasses. No discharge or pain. Ears: No hearing loss, No tinnitus. Respiratory: No asthma, COPD, pneumonias. No shortness of breath. No hemoptysis. Cardiovascular: Positive chest pain, palpitation, leg edema. Gastrointestinal: No nausea, vomiting, diarrhea, constipation. No GI bleed. No hepatitis. Genitourinary: No dysuria, hematuria, kidney stone. No incontinance. Neurological: Positive headache, no stroke, no seizures.  Psychiatry: No psych facility admission  for anxiety, depression, suicide. No detox. Skin: No rash. Musculoskeletal: Positive joint pain, fibromyalgia, neck pain, back pain. Lymphadenopathy: No lymphadenopathy. Hematology: No anemia or easy bruising.   There were no vitals taken for this visit. There is no height or weight on file to calculate BMI. General appearance: alert, cooperative, appears stated age and no distress Head: Normocephalic, atraumatic. Eyes: Brown eyes, pink conjunctiva, corneas clear. PERRL, EOM's intact. Neck: No adenopathy, no carotid bruit, no JVD, supple, symmetrical, trachea midline and thyroid not enlarged. Resp: Clear to auscultation bilaterally. Chest wall non-tender. Cardio: Regular rate and rhythm, S1, S2 normal, II/VI systolic murmur, no click, rub or gallop GI: Soft, non-tender; bowel sounds normal; no organomegaly. Extremities: No edema, cyanosis or clubbing. Skin: Warm and dry.  Neurologic: Alert and oriented X 3, normal strength. Normal coordination and gait.  Assessment/Plan Chest pain r/o MI Hypertension Type II DM Hyperlipidemia CAD  Place in observation. IV heparin and cycle Troponin-I. Nuclear stress test in AM.  Catherine Rodriguez, MD  12/26/2016, 6:14 PM

## 2016-12-27 ENCOUNTER — Observation Stay (HOSPITAL_COMMUNITY): Payer: Medicare Other

## 2016-12-27 ENCOUNTER — Encounter (HOSPITAL_COMMUNITY): Payer: Self-pay | Admitting: Radiology

## 2016-12-27 DIAGNOSIS — I129 Hypertensive chronic kidney disease with stage 1 through stage 4 chronic kidney disease, or unspecified chronic kidney disease: Secondary | ICD-10-CM | POA: Diagnosis not present

## 2016-12-27 DIAGNOSIS — R0789 Other chest pain: Secondary | ICD-10-CM | POA: Diagnosis not present

## 2016-12-27 DIAGNOSIS — Z23 Encounter for immunization: Secondary | ICD-10-CM | POA: Diagnosis not present

## 2016-12-27 DIAGNOSIS — R200 Anesthesia of skin: Secondary | ICD-10-CM | POA: Diagnosis not present

## 2016-12-27 LAB — CBC
HEMATOCRIT: 36.3 % (ref 36.0–46.0)
Hemoglobin: 11.7 g/dL — ABNORMAL LOW (ref 12.0–15.0)
MCH: 29 pg (ref 26.0–34.0)
MCHC: 32.2 g/dL (ref 30.0–36.0)
MCV: 90.1 fL (ref 78.0–100.0)
Platelets: 245 10*3/uL (ref 150–400)
RBC: 4.03 MIL/uL (ref 3.87–5.11)
RDW: 12.9 % (ref 11.5–15.5)
WBC: 5.4 10*3/uL (ref 4.0–10.5)

## 2016-12-27 LAB — LIPID PANEL
Cholesterol: 169 mg/dL (ref 0–200)
HDL: 52 mg/dL (ref >40)
LDL CALC: 106 mg/dL — AB (ref 0–99)
Total CHOL/HDL Ratio: 3.3 RATIO
Triglycerides: 55 mg/dL (ref <150)
VLDL: 11 mg/dL (ref 0–40)

## 2016-12-27 LAB — BASIC METABOLIC PANEL
Anion gap: 7 (ref 5–15)
BUN: 9 mg/dL (ref 6–20)
CO2: 26 mmol/L (ref 22–32)
Calcium: 9.9 mg/dL (ref 8.9–10.3)
Chloride: 108 mmol/L (ref 101–111)
Creatinine, Ser: 0.99 mg/dL (ref 0.44–1.00)
GFR calc Af Amer: >60 mL/min (ref >60)
GFR, EST NON AFRICAN AMERICAN: 56 mL/min — AB (ref >60)
GLUCOSE: 100 mg/dL — AB (ref 65–99)
POTASSIUM: 4.8 mmol/L (ref 3.5–5.1)
Sodium: 141 mmol/L (ref 135–145)

## 2016-12-27 LAB — HEPARIN LEVEL (UNFRACTIONATED)
HEPARIN UNFRACTIONATED: 0.47 [IU]/mL (ref 0.30–0.70)
Heparin Unfractionated: 0.46 IU/mL (ref 0.30–0.70)

## 2016-12-27 LAB — TROPONIN I: Troponin I: <0.03 ng/mL (ref <0.03)

## 2016-12-27 MED ORDER — POTASSIUM CHLORIDE CRYS ER 20 MEQ PO TBCR: 10.0000 meq | EXTENDED_RELEASE_TABLET | Freq: Two times a day (BID) | ORAL | Status: DC

## 2016-12-27 MED ORDER — POTASSIUM CHLORIDE CRYS ER 10 MEQ PO TBCR
10.0000 meq | EXTENDED_RELEASE_TABLET | Freq: Two times a day (BID) | ORAL | Status: DC
  Administered 2016-12-27: 10 meq via ORAL

## 2016-12-27 MED ORDER — FUROSEMIDE 20 MG PO TABS
20.0000 mg | ORAL_TABLET | Freq: Every day | ORAL | Status: DC
  Administered 2016-12-27: 20 mg via ORAL
  Filled 2016-12-27: qty 1

## 2016-12-27 MED ORDER — MAGNESIUM 200 MG PO TABS
30.0000 mg | ORAL_TABLET | Freq: Every day | ORAL | Status: DC
  Filled 2016-12-27: qty 1

## 2016-12-27 MED ORDER — REGADENOSON 0.4 MG/5ML IV SOLN
INTRAVENOUS | Status: AC
  Filled 2016-12-27: qty 5

## 2016-12-27 MED ORDER — CALCIUM CARBONATE 1250 (500 CA) MG PO TABS: 1.0000 | ORAL_TABLET | Freq: Every day | ORAL | Status: DC

## 2016-12-27 MED ORDER — TECHNETIUM TC 99M TETROFOSMIN IV KIT
10.0000 | PACK | Freq: Once | INTRAVENOUS | Status: AC | PRN
  Administered 2016-12-27: 10 via INTRAVENOUS

## 2016-12-27 MED ORDER — LEVOTHYROXINE SODIUM 25 MCG PO TABS
125.0000 ug | ORAL_TABLET | ORAL | Status: DC
  Filled 2016-12-27: qty 1

## 2016-12-27 MED ORDER — IRBESARTAN 300 MG PO TABS
300.0000 mg | ORAL_TABLET | Freq: Every day | ORAL | Status: DC
  Administered 2016-12-27: 300 mg via ORAL
  Filled 2016-12-27: qty 1

## 2016-12-27 MED ORDER — CALCIUM GLUCONATE 500 MG PO TABS: 1.0000 | ORAL_TABLET | Freq: Every day | ORAL | Status: DC

## 2016-12-27 MED ORDER — ISOSORBIDE MONONITRATE ER 60 MG PO TB24
60.0000 mg | ORAL_TABLET | Freq: Every day | ORAL | Status: DC
  Administered 2016-12-27: 60 mg via ORAL
  Filled 2016-12-27: qty 1

## 2016-12-27 MED ORDER — VITAMIN C 500 MG PO TABS
250.0000 mg | ORAL_TABLET | Freq: Every day | ORAL | Status: DC
Start: 2016-12-27 — End: 2016-12-27
  Administered 2016-12-27: 250 mg via ORAL
  Filled 2016-12-27: qty 1

## 2016-12-27 MED ORDER — OMEGA-3-ACID ETHYL ESTERS 1 G PO CAPS
1.0000 g | ORAL_CAPSULE | Freq: Every day | ORAL | Status: DC
  Administered 2016-12-27: 1 g via ORAL
  Filled 2016-12-27: qty 1

## 2016-12-27 MED ORDER — COENZYME Q10 30 MG PO CAPS: 60.0000 mg | ORAL_CAPSULE | Freq: Every day | ORAL | Status: DC

## 2016-12-27 MED ORDER — MAGNESIUM GLUCONATE 500 MG PO TABS
500.0000 mg | ORAL_TABLET | Freq: Every day | ORAL | Status: DC
  Administered 2016-12-27: 500 mg via ORAL
  Filled 2016-12-27: qty 1

## 2016-12-27 MED ORDER — AMLODIPINE-OLMESARTAN 5-40 MG PO TABS: 1.0000 | ORAL_TABLET | Freq: Every day | ORAL | Status: DC

## 2016-12-27 MED ORDER — ADULT MULTIVITAMIN W/MINERALS CH
1.0000 | ORAL_TABLET | Freq: Every day | ORAL | Status: DC
  Administered 2016-12-27: 1 via ORAL
  Filled 2016-12-27: qty 1

## 2016-12-27 MED ORDER — NITROGLYCERIN 0.4 MG SL SUBL: 0.4000 mg | SUBLINGUAL_TABLET | SUBLINGUAL | 1 refills | Status: DC | PRN

## 2016-12-27 MED ORDER — CILOSTAZOL 100 MG PO TABS
100.0000 mg | ORAL_TABLET | Freq: Two times a day (BID) | ORAL | Status: DC
  Administered 2016-12-27: 100 mg via ORAL
  Filled 2016-12-27 (×2): qty 1

## 2016-12-27 MED ORDER — TRAVOPROST (BAK FREE) 0.004 % OP SOLN
1.0000 [drp] | Freq: Every day | OPHTHALMIC | Status: DC
  Filled 2016-12-27: qty 2.5

## 2016-12-27 MED ORDER — INSULIN GLARGINE 100 UNIT/ML ~~LOC~~ SOLN
22.0000 [IU] | SUBCUTANEOUS | Status: DC
  Filled 2016-12-27: qty 0.22

## 2016-12-27 MED ORDER — AMLODIPINE BESYLATE 5 MG PO TABS
5.0000 mg | ORAL_TABLET | Freq: Every day | ORAL | Status: DC
  Administered 2016-12-27: 5 mg via ORAL
  Filled 2016-12-27: qty 1

## 2016-12-27 MED ORDER — TECHNETIUM TC 99M TETROFOSMIN IV KIT
30.0000 | PACK | Freq: Once | INTRAVENOUS | Status: AC | PRN
  Administered 2016-12-27: 30 via INTRAVENOUS

## 2016-12-27 MED ORDER — BRIMONIDINE TARTRATE 0.15 % OP SOLN
1.0000 [drp] | Freq: Three times a day (TID) | OPHTHALMIC | Status: DC
  Filled 2016-12-27: qty 5

## 2016-12-27 MED ORDER — ALBUTEROL SULFATE (2.5 MG/3ML) 0.083% IN NEBU: 3.0000 mL | INHALATION_SOLUTION | Freq: Four times a day (QID) | RESPIRATORY_TRACT | Status: DC | PRN

## 2016-12-27 MED ORDER — REGADENOSON 0.4 MG/5ML IV SOLN
0.4000 mg | Freq: Once | INTRAVENOUS | Status: AC
  Administered 2016-12-27: 0.4 mg via INTRAVENOUS
  Filled 2016-12-27: qty 5

## 2016-12-27 NOTE — Progress Notes (Signed)
ANTICOAGULATION CONSULT NOTE - Follow Up Consult  Pharmacy Consult for Heparin Indication: chest pain/ACS  Allergies  Allergen Reactions  . Atorvastatin Other (See Comments)    unknown  . Metoprolol Tartrate Swelling  . Rosuvastatin Calcium Other (See Comments)    unknown    Patient Measurements: Height:  (157.5 cm) Weight: 191 lb 14.4 oz (87 kg) (scale a) IBW/kg (Calculated) : 50.1 Heparin Dosing Weight: 70 kg  Vital Signs: Temp: 98 F (36.7 C) (10/04 1033) Temp Source: Oral (10/04 1033) BP: 163/92 (10/04 1129) Pulse Rate: 52 (10/04 1033)  Labs:  Recent Labs  12/26/16 1906 12/27/16 0206 12/27/16 0407 12/27/16 0628 12/27/16 1229  HGB 12.2 11.7*  --   --   --   HCT 37.1 36.3  --   --   --   PLT 274 245  --   --   --   APTT 34  --   --   --   --   LABPROT 12.9  --   --   --   --   INR 0.98  --   --   --   --   HEPARINUNFRC  --   --  0.46  --  0.47  CREATININE 1.06*  --   --  0.99  --   TROPONINI <0.03 <0.03  --  <0.03  --     Estimated Creatinine Clearance: 53.4 mL/min (by C-G formula based on SCr of 0.99 mg/dL).  . heparin 850 Units/hr (12/26/16 1950)    Assessment: 71 y.o. female with chest pain for heparin.  Heparin level remains at goal this afternoon on IV Heparin.  No overt bleeding or complications noted, CBC stable.  Goal of Therapy:  Heparin level 0.3-0.7 units/ml Monitor platelets by anticoagulation protocol: Yes   Plan:  Continue Heparin at current rate   Daily heparin level and CBC.  Tad Moore, BCPS  Clinical Pharmacist Pager [phone]  12/27/2016 2:08 PM

## 2016-12-27 NOTE — Progress Notes (Signed)
ANTICOAGULATION CONSULT NOTE - Follow Up Consult  Pharmacy Consult for Heparin Indication: chest pain/ACS  Allergies  Allergen Reactions  . Atorvastatin Other (See Comments)    unknown  . Metoprolol Tartrate Swelling  . Rosuvastatin Calcium Other (See Comments)    unknown    Patient Measurements: Height:  (157.5 cm) Weight: 191 lb 14.4 oz (87 kg) (scale a) IBW/kg (Calculated) : 50.1 Heparin Dosing Weight: 70 kg  Vital Signs: Temp: 98.4 F (36.9 C) (10/04 0450) Temp Source: Oral (10/04 0450) BP: 170/70 (10/04 0450) Pulse Rate: 45 (10/04 0450)  Labs:  Recent Labs  12/26/16 1906 12/27/16 0206 12/27/16 0407  HGB 12.2 11.7*  --   HCT 37.1 36.3  --   PLT 274 245  --   APTT 34  --   --   LABPROT 12.9  --   --   INR 0.98  --   --   HEPARINUNFRC  --   --  0.46  CREATININE 1.06*  --   --   TROPONINI <0.03 <0.03  --     Estimated Creatinine Clearance: 49.9 mL/min (A) (by C-G formula based on SCr of 1.06 mg/dL (H)).  Assessment: 71 y.o. female with chest pain for heparin  Goal of Therapy:  Heparin level 0.3-0.7 units/ml Monitor platelets by anticoagulation protocol: Yes   Plan:  Continue Heparin at current rate   Dystany Duffy, Gary Fleet 12/27/2016,5:12 AM

## 2016-12-27 NOTE — Discharge Summary (Signed)
Physician Discharge Summary  Patient ID: [name] MRN: [id number] DOB/AGE: [birth year] 71 y.o.  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 Her C-spine x-ray showed cervical disc disease with foraminal narrowing.  She underwent nuclear stress test to that failed to showed small distal lateral wall reversible ischemia. Patient was made aware of decline in renal function and decrease insulin dose as needed.  Patient wants to try medical therapy for now before considering cardiac catheterization.  She was discharged home in satisfactory condition with follow-up by me in 2 weeks and by primary care physician in one month.  She will consult with primary care for referral for cervical disc disease and use heating pad, Aspercreme and massage in the mean time.  Consults: cardiology  Significant Diagnostic Studies: labs: Normal CBC. Near normal CMET except creatinine of 1.06. Hgb A1C of 6.1. Lipid panel near normal except LDL cholesterol of 106 mg.  EKG-Sinus bradycardia. Incomplete RBBB.  Chest x-ray: Borderline cardiomegaly.  C-spine X-ray: Multilevel cervical disc disease and moderate C5-6 neural foraminal narrowing.  Nuclear stress test : Small lateral wall reversible ischemia.  Treatments: cardiac meds: Aspirin, amlodipine, furosemide, potassium and isosorbide mononitrate and  Olmesartan.  Discharge Exam: Blood pressure (!) 163/92, pulse (!) 52, temperature 98 F (36.7 C), temperature source Oral, resp. rate 16, height  (1.575 m), weight 87 kg (191 lb 14.4 oz), SpO2 100 %. General appearance: alert, cooperative and appears stated age. Head: Normocephalic, atraumatic. Eyes: Brown eyes, pink conjunctiva, corneas clear. PERRL, EOM's intact.  Neck: No adenopathy, no carotid bruit, no JVD, supple, symmetrical, trachea midline and thyroid not enlarged. Resp: Clear to auscultation bilaterally. Cardio: Regular rate and rhythm, S1, S2 normal, II/VI systolic murmur, no click, rub or gallop. GI: Soft, non-tender; bowel sounds normal; no organomegaly. Extremities: No edema, cyanosis or clubbing. Skin: Warm and dry.  Neurologic: Alert and oriented X 3, normal strength and tone. Normal coordination and gait.  Disposition: 01-Home or Self Care   Allergies as of 12/27/2016      Reactions   Atorvastatin Other (See Comments)   unknown   Metoprolol Tartrate Swelling   Rosuvastatin Calcium Other (See Comments)   unknown      Medication List    TAKE these medications   albuterol 108 (90 Base) MCG/ACT inhaler Commonly known as:  PROVENTIL HFA;VENTOLIN HFA Inhale 2 puffs into the lungs every 6 (six) hours as needed for wheezing or shortness of breath.   amLODipine-olmesartan 5-40 MG tablet Commonly known as:  AZOR Take 1 tablet by mouth daily.   aspirin 81 MG tablet Take 81 mg by mouth daily.   b complex vitamins capsule Take 1 capsule by mouth 2 (two) times daily.   Biotin 2500 MCG Caps Take 2,500 mcg by mouth 2 (two) times daily.   brimonidine 0.1 % Soln Commonly known as:  ALPHAGAN P Place 1 drop into both eyes every 8 (eight) hours.   calcium  gluconate 500 MG tablet Take 1 tablet by mouth daily.   Cholecalciferol 4000 units Caps Take 2,000 Units by mouth 2 (two) times daily.   cilostazol 100 MG tablet Commonly known as:  PLETAL Take 100 mg by  mouth 2 (two) times daily.   Cinnamon 500 MG capsule Take 500 mg by mouth daily.   co-enzyme Q-10 30 MG capsule Take 60 mg by mouth daily.   furosemide 20 MG tablet Commonly known as:  LASIX Take 20 mg by mouth daily.   insulin glargine 100 UNIT/ML injection Commonly known as:  LANTUS Inject 22-24 Units into the skin See admin instructions. Per Sliding Scale   isosorbide mononitrate 60 MG 24 hr tablet Commonly known as:  IMDUR Take 60 mg by mouth daily.   levothyroxine 125 MCG tablet Commonly known as:  SYNTHROID, LEVOTHROID Take 125 mcg by mouth See admin instructions. 125 mcg by mouth daily on Mon thru Friday   magnesium 30 MG tablet Take 30 mg by mouth daily.   multivitamin with minerals tablet Take 1 tablet by mouth daily.   HAIR SKIN AND NAILS FORMULA Tabs Take 1 tablet by mouth daily.   nitroGLYCERIN 0.4 MG SL tablet Commonly known as:  NITROSTAT Place 1 tablet (0.4 mg total) under the tongue every 5 (five) minutes x 3 doses as needed for chest pain.   omega-3 acid ethyl esters 1 g capsule Commonly known as:  LOVAZA Take 1 g by mouth daily.   potassium chloride SA 20 MEQ tablet Commonly known as:  K-DUR,KLOR-CON Take 0.5 tablets (10 mEq total) by mouth 2 (two) times daily.   travoprost (benzalkonium) 0.004 % ophthalmic solution Commonly known as:  TRAVATAN Place 1 drop into both eyes at bedtime.   vitamin C 250 MG tablet Commonly known as:  ASCORBIC ACID Take 250 mg by mouth daily.      Follow-up Information    [name], MD. Schedule an appointment as soon as possible for a visit in 1 month(s).   Specialty:  Internal Medicine Contact information: [address] [place] [address] [phone]        Orpah Cobb, MD. Schedule an appointment as soon as possible for a visit in 2 week(s).   Specialty:  Cardiology Contact information: [address] [place] [address] [phone]            Signed: Ricki Rodriguez 12/27/2016, 5:35 PM

## 2016-12-28 NOTE — Progress Notes (Signed)
Pt has orders to be discharged. Discharge instructions given and pt has no additional questions at this time. Medication regimen reviewed and pt educated. Pt verbalized understanding and has no additional questions. Telemetry box removed. IV removed and site in good condition. Pt stable and family at bedside to transport home.

## 2017-01-31 ENCOUNTER — Ambulatory Visit
Admission: RE | Admit: 2017-01-31 | Discharge: 2017-01-31 | Disposition: A | Discharge status: Home / Self Care | Payer: Medicare Other | Source: Ambulatory Visit | Attending: Internal Medicine | Admitting: Internal Medicine

## 2017-01-31 DIAGNOSIS — Z1231 Encounter for screening mammogram for malignant neoplasm of breast: Secondary | ICD-10-CM

## 2017-02-08 ENCOUNTER — Other Ambulatory Visit: Payer: Self-pay | Admitting: Internal Medicine

## 2017-02-08 DIAGNOSIS — E2839 Other primary ovarian failure: Secondary | ICD-10-CM

## 2017-06-12 ENCOUNTER — Encounter (HOSPITAL_COMMUNITY): Payer: Self-pay | Admitting: General Practice

## 2017-06-12 ENCOUNTER — Observation Stay (HOSPITAL_COMMUNITY)
Admission: AD | Admit: 2017-06-12 | Discharge: 2017-06-13 | Disposition: A | Discharge status: Home / Self Care | Payer: Medicare Other | Source: Ambulatory Visit | Attending: Cardiovascular Disease | Admitting: Cardiovascular Disease

## 2017-06-12 DIAGNOSIS — N182 Chronic kidney disease, stage 2 (mild): Secondary | ICD-10-CM | POA: Diagnosis not present

## 2017-06-12 DIAGNOSIS — R072 Precordial pain: Secondary | ICD-10-CM | POA: Diagnosis present

## 2017-06-12 DIAGNOSIS — D649 Anemia, unspecified: Secondary | ICD-10-CM | POA: Insufficient documentation

## 2017-06-12 DIAGNOSIS — Z7902 Long term (current) use of antithrombotics/antiplatelets: Secondary | ICD-10-CM | POA: Diagnosis not present

## 2017-06-12 DIAGNOSIS — Z794 Long term (current) use of insulin: Secondary | ICD-10-CM | POA: Diagnosis not present

## 2017-06-12 DIAGNOSIS — Z888 Allergy status to other drugs, medicaments and biological substances status: Secondary | ICD-10-CM | POA: Diagnosis not present

## 2017-06-12 DIAGNOSIS — E785 Hyperlipidemia, unspecified: Secondary | ICD-10-CM | POA: Diagnosis not present

## 2017-06-12 DIAGNOSIS — I25110 Atherosclerotic heart disease of native coronary artery with unstable angina pectoris: Secondary | ICD-10-CM | POA: Diagnosis not present

## 2017-06-12 DIAGNOSIS — Z79890 Hormone replacement therapy (postmenopausal): Secondary | ICD-10-CM | POA: Insufficient documentation

## 2017-06-12 DIAGNOSIS — E669 Obesity, unspecified: Secondary | ICD-10-CM | POA: Insufficient documentation

## 2017-06-12 DIAGNOSIS — E890 Postprocedural hypothyroidism: Secondary | ICD-10-CM | POA: Diagnosis not present

## 2017-06-12 DIAGNOSIS — E7800 Pure hypercholesterolemia, unspecified: Secondary | ICD-10-CM | POA: Insufficient documentation

## 2017-06-12 DIAGNOSIS — E1122 Type 2 diabetes mellitus with diabetic chronic kidney disease: Secondary | ICD-10-CM | POA: Insufficient documentation

## 2017-06-12 DIAGNOSIS — Z79899 Other long term (current) drug therapy: Secondary | ICD-10-CM | POA: Insufficient documentation

## 2017-06-12 DIAGNOSIS — I129 Hypertensive chronic kidney disease with stage 1 through stage 4 chronic kidney disease, or unspecified chronic kidney disease: Secondary | ICD-10-CM | POA: Diagnosis not present

## 2017-06-12 DIAGNOSIS — R9439 Abnormal result of other cardiovascular function study: Secondary | ICD-10-CM | POA: Diagnosis present

## 2017-06-12 DIAGNOSIS — E1151 Type 2 diabetes mellitus with diabetic peripheral angiopathy without gangrene: Secondary | ICD-10-CM | POA: Insufficient documentation

## 2017-06-12 DIAGNOSIS — Z7982 Long term (current) use of aspirin: Secondary | ICD-10-CM | POA: Insufficient documentation

## 2017-06-12 DIAGNOSIS — R001 Bradycardia, unspecified: Secondary | ICD-10-CM | POA: Insufficient documentation

## 2017-06-12 DIAGNOSIS — Z6834 Body mass index (BMI) 34.0-34.9, adult: Secondary | ICD-10-CM | POA: Insufficient documentation

## 2017-06-12 DIAGNOSIS — I249 Acute ischemic heart disease, unspecified: Secondary | ICD-10-CM | POA: Diagnosis present

## 2017-06-12 DIAGNOSIS — I10 Essential (primary) hypertension: Secondary | ICD-10-CM | POA: Diagnosis present

## 2017-06-12 LAB — COMPREHENSIVE METABOLIC PANEL
ALK PHOS: 59 U/L (ref 38–126)
ALT: 26 U/L (ref 14–54)
ANION GAP: 10 (ref 5–15)
AST: 49 U/L — ABNORMAL HIGH (ref 15–41)
Albumin: 3.8 g/dL (ref 3.5–5.0)
BUN: 10 mg/dL (ref 6–20)
CHLORIDE: 105 mmol/L (ref 101–111)
CO2: 23 mmol/L (ref 22–32)
Calcium: 9.6 mg/dL (ref 8.9–10.3)
Creatinine, Ser: 1.07 mg/dL — ABNORMAL HIGH (ref 0.44–1.00)
GFR calc non Af Amer: 51 mL/min — ABNORMAL LOW (ref >60)
GFR, EST AFRICAN AMERICAN: 59 mL/min — AB (ref >60)
GLUCOSE: 131 mg/dL — AB (ref 65–99)
POTASSIUM: 4.4 mmol/L (ref 3.5–5.1)
SODIUM: 138 mmol/L (ref 135–145)
Total Bilirubin: 0.3 mg/dL (ref 0.3–1.2)
Total Protein: 6.3 g/dL — ABNORMAL LOW (ref 6.5–8.1)

## 2017-06-12 LAB — CBC WITH DIFFERENTIAL/PLATELET
BASOS ABS: 0 10*3/uL (ref 0.0–0.1)
Basophils Relative: 0 %
EOS PCT: 2 %
Eosinophils Absolute: 0.1 10*3/uL (ref 0.0–0.7)
HCT: 35.8 % — ABNORMAL LOW (ref 36.0–46.0)
HEMOGLOBIN: 11.6 g/dL — AB (ref 12.0–15.0)
LYMPHS ABS: 2.2 10*3/uL (ref 0.7–4.0)
LYMPHS PCT: 52 %
MCH: 29.6 pg (ref 26.0–34.0)
MCHC: 32.4 g/dL (ref 30.0–36.0)
MCV: 91.3 fL (ref 78.0–100.0)
Monocytes Absolute: 0.4 10*3/uL (ref 0.1–1.0)
Monocytes Relative: 9 %
NEUTROS ABS: 1.6 10*3/uL — AB (ref 1.7–7.7)
NEUTROS PCT: 37 %
PLATELETS: 270 10*3/uL (ref 150–400)
RBC: 3.92 MIL/uL (ref 3.87–5.11)
RDW: 13.2 % (ref 11.5–15.5)
WBC: 4.3 10*3/uL (ref 4.0–10.5)

## 2017-06-12 LAB — TROPONIN I: Troponin I: <0.03 ng/mL (ref <0.03)

## 2017-06-12 MED ORDER — ONDANSETRON HCL 4 MG/2ML IJ SOLN: 4.0000 mg | Freq: Four times a day (QID) | INTRAMUSCULAR | Status: DC | PRN

## 2017-06-12 MED ORDER — SODIUM CHLORIDE 0.9 % IV SOLN
INTRAVENOUS | Status: DC
  Administered 2017-06-12: 20:00:00 via INTRAVENOUS

## 2017-06-12 MED ORDER — NITROGLYCERIN 0.4 MG SL SUBL
0.4000 mg | SUBLINGUAL_TABLET | SUBLINGUAL | Status: DC | PRN
  Administered 2017-06-12 (×2): 0.4 mg via SUBLINGUAL
  Filled 2017-06-12: qty 1

## 2017-06-12 MED ORDER — HEPARIN BOLUS VIA INFUSION
4000.0000 [IU] | Freq: Once | INTRAVENOUS | Status: AC
  Administered 2017-06-12: 4000 [IU] via INTRAVENOUS
  Filled 2017-06-12: qty 4000

## 2017-06-12 MED ORDER — ASPIRIN EC 81 MG PO TBEC: 81.0000 mg | DELAYED_RELEASE_TABLET | Freq: Every day | ORAL | Status: DC

## 2017-06-12 MED ORDER — HEPARIN (PORCINE) IN NACL 100-0.45 UNIT/ML-% IJ SOLN
850.0000 [IU]/h | INTRAMUSCULAR | Status: DC
  Administered 2017-06-12: 850 [IU]/h via INTRAVENOUS
  Filled 2017-06-12: qty 250

## 2017-06-12 MED ORDER — HYDROCODONE-ACETAMINOPHEN 5-325 MG PO TABS
1.0000 | ORAL_TABLET | ORAL | Status: DC | PRN
  Filled 2017-06-12: qty 1

## 2017-06-12 NOTE — H&P (Signed)
Referring Physician:  [name] is an 72 y.o. female.                       Chief Complaint: Chest pain  HPI: 72 year old female has retrosternal chest pain with left arm numbness, more frequent since last 2 weeks. She has PMH of hypertension, hypothyroidism, obesity, hyperlipidemia and type 2 DM. She denies fever or cough.   Past Medical History:  Diagnosis Date  . Benign hypertensive renal disease   . CKD stage 2 due to type 2 diabetes mellitus (HCC)   . Hypertension   . Hypothyroidism   . Obesity   . Pure hypercholesterolemia   . PVD (peripheral vascular disease) (HCC)   . Vitamin D deficiency       Past Surgical History:  Procedure Laterality Date  . ABDOMINAL HYSTERECTOMY    . BREAST CYST EXCISION Right over 20 yrs  . broken leg and displaced ankle    . DILATION AND CURETTAGE OF UTERUS    . right and left rotator cuff    . THYROIDECTOMY      Family History  Problem Relation Age of Onset  . Alzheimer's disease Mother   . Aneurysm Father   . Cancer Brother    Social History:  reports that  has never smoked. she has never used smokeless tobacco. She reports that she does not drink alcohol or use drugs.  Allergies:  Allergies  Allergen Reactions  . Atorvastatin Other (See Comments)    unknown  . Metoprolol Tartrate Swelling  . Rosuvastatin Calcium Other (See Comments)    unknown    Medications Prior to Admission  Medication Sig Dispense Refill  . calcium gluconate 500 MG tablet Take 1 tablet by mouth daily.    . Cholecalciferol 4000 units CAPS Take 2,000 Units by mouth 2 (two) times daily.     . Cinnamon 500 MG capsule Take 500 mg by mouth daily.    . magnesium 30 MG tablet Take 30 mg by mouth daily.     . Multiple Vitamins-Minerals (MULTIVITAMIN WITH MINERALS) tablet Take 1 tablet by mouth daily.    [name] omega-3 acid ethyl esters (LOVAZA) 1 g capsule Take 1 g by mouth daily.     [name] albuterol (PROVENTIL HFA;VENTOLIN HFA) 108 (90 Base) MCG/ACT inhaler Inhale 2  puffs into the lungs every 6 (six) hours as needed for wheezing or shortness of breath. 1 Inhaler 2  . amLODipine-olmesartan (AZOR) 5-40 MG tablet Take 1 tablet by mouth daily.    [name] aspirin 81 MG tablet Take 81 mg by mouth daily.    [name] b complex vitamins capsule Take 1 capsule by mouth 2 (two) times daily.     . Biotin 2500 MCG CAPS Take 2,500 mcg by mouth 2 (two) times daily.    . brimonidine (ALPHAGAN P) 0.1 % SOLN Place 1 drop into both eyes every 8 (eight) hours.    . cilostazol (PLETAL) 100 MG tablet Take 100 mg by mouth 2 (two) times daily.    [name] co-enzyme Q-10 30 MG capsule Take 60 mg by mouth daily.    . furosemide (LASIX) 20 MG tablet Take 20 mg by mouth daily.     . insulin glargine (LANTUS) 100 UNIT/ML injection Inject 22-24 Units into the skin See admin instructions. Per Sliding Scale    . isosorbide mononitrate (IMDUR) 60 MG 24 hr tablet Take 60 mg by mouth daily.    [name] levothyroxine (SYNTHROID, LEVOTHROID)  125 MCG tablet Take 125 mcg by mouth See admin instructions. 125 mcg by mouth daily on Mon thru Friday    . Multiple Vitamins-Minerals (HAIR SKIN AND NAILS FORMULA) TABS Take 1 tablet by mouth daily.    . nitroGLYCERIN (NITROSTAT) 0.4 MG SL tablet Place 1 tablet (0.4 mg total) under the tongue every 5 (five) minutes x 3 doses as needed for chest pain. 25 tablet 1  . potassium chloride SA (K-DUR,KLOR-CON) 20 MEQ tablet Take 0.5 tablets (10 mEq total) by mouth 2 (two) times daily.    . travoprost, benzalkonium, (TRAVATAN) 0.004 % ophthalmic solution Place 1 drop into both eyes at bedtime.     . vitamin C (ASCORBIC ACID) 250 MG tablet Take 250 mg by mouth daily.      No results found for this or any previous visit (from the past 48 hour(s)). No results found.  Review Of Systems Constitutional: No fever, chills, weight loss or gain. Eyes: No vision change, wears glasses. No discharge or pain. Ears: No hearing loss, No tinnitus. Respiratory: No asthma, COPD, pneumonias. Positive  shortness of breath. No hemoptysis. Cardiovascular: Positive chest pain, palpitation, leg edema. Gastrointestinal: No nausea, vomiting, diarrhea, constipation. No GI bleed. No hepatitis. Genitourinary: No dysuria, hematuria, kidney stone. No incontinance. Neurological: Positive headache, stroke, seizures.  Psychiatry: No psych facility admission for anxiety, depression, suicide. No detox. Skin: No rash. Musculoskeletal: Positive joint pain, no fibromyalgia. No neck pain, back pain. Lymphadenopathy: No lymphadenopathy. Hematology: No anemia or easy bruising.   Blood pressure 123/62, pulse (!) 45, temperature 98.4 F (36.9 C), temperature source Oral, height 5' 2.5" (1.588 m), weight 86.5 kg (190 lb 9.6 oz), SpO2 99 %. Body mass index is 34.31 kg/m. General appearance: alert, cooperative, appears stated age and no distress Head: Normocephalic, atraumatic. Eyes: Brown eyes, pink conjunctiva, corneas clear. PERRL, EOM's intact. Neck: No adenopathy, no carotid bruit, no JVD, supple, symmetrical, trachea midline and thyroid not enlarged. Resp: Clear to auscultation bilaterally. Cardio: Regular rate and rhythm, S1, S2 normal, III/VI systolic and II/VI diastolic murmur, no click, rub or gallop GI: Soft, non-tender; bowel sounds normal; no organomegaly. Extremities: No edema, cyanosis or clubbing. Skin: Warm and dry.  Neurologic: Alert and oriented X 3, normal strength. Normal coordination and slow gait.  Assessment/Plan Acute coronary syndrome. Hypertension Type 2 DM Hyperlipidemia  Place in observation. Cardiac cath in AM. Patient understood procedure, risks and alternative.  Catherine RodriguezAjay S Genesis Novosad, MD  06/12/2017, 7:25 PM

## 2017-06-12 NOTE — Progress Notes (Signed)
Pt c/o of 8/10 lt sided chest pressure radiating to lt flank & bilateral arms around 1940. Pt given 1 SL  Nitro pain decreased to 6/10 & per pt was coming down so she refused to take another one. EKG & VS stable & in chart. Pt started c/o of 7/10 cp that was now located in the center of her chest w/o radiation. Pt given another SL nitro & her pain went down to 6/10. MD aware new orders for oxy. MD came to see pt. Will continue to monitor the pt. Per pt her pain level is going down lowest pain level noted was 6/10. Sanda LingerMilam, Kaelon Weekes R, RN

## 2017-06-12 NOTE — Progress Notes (Signed)
ANTICOAGULATION CONSULT NOTE - Initial Consult  Pharmacy Consult for Heparin Indication: chest pain/ACS  Allergies  Allergen Reactions  . Atorvastatin Other (See Comments)    unknown  . Metoprolol Tartrate Swelling  . Rosuvastatin Calcium Other (See Comments)    unknown    Patient Measurements: Height: 5' 2.5" (158.8 cm) Weight: 190 lb 9.6 oz (86.5 kg) IBW/kg (Calculated) : 51.25 Heparin Dosing Weight: 70.8 kg  Vital Signs: Temp: 98.4 F (36.9 C) (03/20 1902) Temp Source: Oral (03/20 1902) BP: 123/62 (03/20 1902) Pulse Rate: 45 (03/20 1902)  Labs: No results for input(s): HGB, HCT, PLT, APTT, LABPROT, INR, HEPARINUNFRC, HEPRLOWMOCWT, CREATININE, CKTOTAL, CKMB, TROPONINI in the last 72 hours.  CrCl cannot be calculated (Patient's most recent lab result is older than the maximum 21 days allowed.).   Medical History: Past Medical History:  Diagnosis Date  . Benign hypertensive renal disease   . CKD stage 2 due to type 2 diabetes mellitus (HCC)   . Hypertension   . Hypothyroidism   . Obesity   . Pure hypercholesterolemia   . PVD (peripheral vascular disease) (HCC)   . Vitamin D deficiency     Medications:  Medications Prior to Admission  Medication Sig Dispense Refill Last Dose  . calcium gluconate 500 MG tablet Take 1 tablet by mouth daily.   Past Week at Unknown time  . Cholecalciferol 4000 units CAPS Take 2,000 Units by mouth 2 (two) times daily.    Past Week at Unknown time  . Cinnamon 500 MG capsule Take 500 mg by mouth daily.   Past Week at Unknown time  . magnesium 30 MG tablet Take 30 mg by mouth daily.    Past Week at Unknown time  . Multiple Vitamins-Minerals (MULTIVITAMIN WITH MINERALS) tablet Take 1 tablet by mouth daily.   Past Week at Unknown time  . omega-3 acid ethyl esters (LOVAZA) 1 g capsule Take 1 g by mouth daily.    Past Week at Unknown time  . albuterol (PROVENTIL HFA;VENTOLIN HFA) 108 (90 Base) MCG/ACT inhaler Inhale 2 puffs into the lungs  every 6 (six) hours as needed for wheezing or shortness of breath. 1 Inhaler 2  at PRN  . amLODipine-olmesartan (AZOR) 5-40 MG tablet Take 1 tablet by mouth daily.   12/26/2016 at Unknown time  . aspirin 81 MG tablet Take 81 mg by mouth daily.   12/26/2016 at Unknown time  . b complex vitamins capsule Take 1 capsule by mouth 2 (two) times daily.    12/26/2016 at Unknown time  . Biotin 2500 MCG CAPS Take 2,500 mcg by mouth 2 (two) times daily.   12/26/2016 at Unknown time  . brimonidine (ALPHAGAN P) 0.1 % SOLN Place 1 drop into both eyes every 8 (eight) hours.   12/26/2016 at Unknown time  . cilostazol (PLETAL) 100 MG tablet Take 100 mg by mouth 2 (two) times daily.   12/26/2016 at Unknown time  . co-enzyme Q-10 30 MG capsule Take 60 mg by mouth daily.   12/26/2016 at Unknown time  . furosemide (LASIX) 20 MG tablet Take 20 mg by mouth daily.    12/26/2016 at Unknown time  . insulin glargine (LANTUS) 100 UNIT/ML injection Inject 22-24 Units into the skin See admin instructions. Per Sliding Scale   12/26/2016 at 0500  . isosorbide mononitrate (IMDUR) 60 MG 24 hr tablet Take 60 mg by mouth daily.   12/26/2016 at Unknown time  . levothyroxine (SYNTHROID, LEVOTHROID) 125 MCG tablet Take 125 mcg by  mouth See admin instructions. 125 mcg by mouth daily on Mon thru Friday   12/26/2016 at Unknown time  . Multiple Vitamins-Minerals (HAIR SKIN AND NAILS FORMULA) TABS Take 1 tablet by mouth daily.   12/26/2016 at Unknown time  . nitroGLYCERIN (NITROSTAT) 0.4 MG SL tablet Place 1 tablet (0.4 mg total) under the tongue every 5 (five) minutes x 3 doses as needed for chest pain. 25 tablet 1   . potassium chloride SA (K-DUR,KLOR-CON) 20 MEQ tablet Take 0.5 tablets (10 mEq total) by mouth 2 (two) times daily.     . travoprost, benzalkonium, (TRAVATAN) 0.004 % ophthalmic solution Place 1 drop into both eyes at bedtime.    12/25/2016 at Unknown time  . vitamin C (ASCORBIC ACID) 250 MG tablet Take 250 mg by mouth daily.   12/26/2016 at  Unknown time    Assessment: 72 yo F admitted with CP.  To start heparin.  Baseline CBC and CMET pending.  Goal of Therapy:  Heparin level 0.3-0.7 units/ml Monitor platelets by anticoagulation protocol: Yes   Plan:  Heparin 4000 units IV bolus x 1 Heparin infusion at 850 units/hr **was therapeutic on this rate in Oct 2018 Heparin level in 8 hours Daily heparin level and CBC Follow-up s/sx bleeding  [employer], Pharm.D., BCPS Clinical Pharmacist 06/12/2017 7:40 PM

## 2017-06-13 ENCOUNTER — Ambulatory Visit (HOSPITAL_COMMUNITY)
Admission: AD | Disposition: A | Discharge status: Home / Self Care | Payer: Self-pay | Source: Ambulatory Visit | Attending: Cardiovascular Disease

## 2017-06-13 ENCOUNTER — Encounter (HOSPITAL_COMMUNITY): Payer: Self-pay

## 2017-06-13 DIAGNOSIS — I25110 Atherosclerotic heart disease of native coronary artery with unstable angina pectoris: Secondary | ICD-10-CM | POA: Diagnosis not present

## 2017-06-13 DIAGNOSIS — E1122 Type 2 diabetes mellitus with diabetic chronic kidney disease: Secondary | ICD-10-CM | POA: Diagnosis not present

## 2017-06-13 DIAGNOSIS — N182 Chronic kidney disease, stage 2 (mild): Secondary | ICD-10-CM | POA: Diagnosis not present

## 2017-06-13 DIAGNOSIS — I129 Hypertensive chronic kidney disease with stage 1 through stage 4 chronic kidney disease, or unspecified chronic kidney disease: Secondary | ICD-10-CM | POA: Diagnosis not present

## 2017-06-13 HISTORY — PX: LEFT HEART CATH AND CORONARY ANGIOGRAPHY: CATH118249

## 2017-06-13 LAB — LIPID PANEL
CHOL/HDL RATIO: 2.9 ratio
Cholesterol: 175 mg/dL (ref 0–200)
HDL: 60 mg/dL (ref >40)
LDL Cholesterol: 106 mg/dL — ABNORMAL HIGH (ref 0–99)
Triglycerides: 44 mg/dL (ref <150)
VLDL: 9 mg/dL (ref 0–40)

## 2017-06-13 LAB — BASIC METABOLIC PANEL
Anion gap: 9 (ref 5–15)
BUN: 9 mg/dL (ref 6–20)
CHLORIDE: 105 mmol/L (ref 101–111)
CO2: 25 mmol/L (ref 22–32)
Calcium: 9.4 mg/dL (ref 8.9–10.3)
Creatinine, Ser: 1.04 mg/dL — ABNORMAL HIGH (ref 0.44–1.00)
GFR calc non Af Amer: 52 mL/min — ABNORMAL LOW (ref >60)
Glucose, Bld: 97 mg/dL (ref 65–99)
Potassium: 4 mmol/L (ref 3.5–5.1)
SODIUM: 139 mmol/L (ref 135–145)

## 2017-06-13 LAB — POCT ACTIVATED CLOTTING TIME: Activated Clotting Time: 153 seconds

## 2017-06-13 LAB — TROPONIN I: Troponin I: <0.03 ng/mL (ref <0.03)

## 2017-06-13 LAB — GLUCOSE, CAPILLARY: GLUCOSE-CAPILLARY: 100 mg/dL — AB (ref 65–99)

## 2017-06-13 LAB — HEPARIN LEVEL (UNFRACTIONATED): HEPARIN UNFRACTIONATED: 0.46 [IU]/mL (ref 0.30–0.70)

## 2017-06-13 LAB — CBC
HEMATOCRIT: 33.7 % — AB (ref 36.0–46.0)
Hemoglobin: 10.9 g/dL — ABNORMAL LOW (ref 12.0–15.0)
MCH: 29.5 pg (ref 26.0–34.0)
MCHC: 32.3 g/dL (ref 30.0–36.0)
MCV: 91.3 fL (ref 78.0–100.0)
Platelets: 248 10*3/uL (ref 150–400)
RBC: 3.69 MIL/uL — ABNORMAL LOW (ref 3.87–5.11)
RDW: 13.2 % (ref 11.5–15.5)
WBC: 4.9 10*3/uL (ref 4.0–10.5)

## 2017-06-13 LAB — PROTIME-INR
INR: 1.09
Prothrombin Time: 14 seconds (ref 11.4–15.2)

## 2017-06-13 SURGERY — LEFT HEART CATH AND CORONARY ANGIOGRAPHY
Anesthesia: LOCAL

## 2017-06-13 MED ORDER — FENTANYL CITRATE (PF) 100 MCG/2ML IJ SOLN
INTRAMUSCULAR | Status: AC
  Filled 2017-06-13: qty 2

## 2017-06-13 MED ORDER — CLOPIDOGREL BISULFATE 75 MG PO TABS: 75.0000 mg | ORAL_TABLET | Freq: Every day | ORAL | 3 refills | Status: DC

## 2017-06-13 MED ORDER — HEPARIN (PORCINE) IN NACL 2-0.9 UNIT/ML-% IJ SOLN
INTRAMUSCULAR | Status: AC | PRN
  Administered 2017-06-13 (×2): 500 mL

## 2017-06-13 MED ORDER — SODIUM CHLORIDE 0.9% FLUSH: 3.0000 mL | Freq: Two times a day (BID) | INTRAVENOUS | Status: DC

## 2017-06-13 MED ORDER — MIDAZOLAM HCL 2 MG/2ML IJ SOLN
INTRAMUSCULAR | Status: DC | PRN
  Administered 2017-06-13: 1 mg via INTRAVENOUS

## 2017-06-13 MED ORDER — HYDRALAZINE HCL 20 MG/ML IJ SOLN
INTRAMUSCULAR | Status: AC
  Filled 2017-06-13: qty 1

## 2017-06-13 MED ORDER — IOPAMIDOL (ISOVUE-370) INJECTION 76%
INTRAVENOUS | Status: AC
  Filled 2017-06-13: qty 100

## 2017-06-13 MED ORDER — CLOPIDOGREL BISULFATE 75 MG PO TABS: 75.0000 mg | ORAL_TABLET | Freq: Every day | ORAL | Status: DC

## 2017-06-13 MED ORDER — LIDOCAINE HCL (PF) 1 % IJ SOLN
INTRAMUSCULAR | Status: DC | PRN
  Administered 2017-06-13: 20 mL via INTRADERMAL

## 2017-06-13 MED ORDER — HYDRALAZINE HCL 20 MG/ML IJ SOLN
INTRAMUSCULAR | Status: DC | PRN
  Administered 2017-06-13: 10 mg via INTRAVENOUS

## 2017-06-13 MED ORDER — MIDAZOLAM HCL 2 MG/2ML IJ SOLN
INTRAMUSCULAR | Status: AC
  Filled 2017-06-13: qty 2

## 2017-06-13 MED ORDER — HYDRALAZINE HCL 10 MG PO TABS: 10.0000 mg | ORAL_TABLET | Freq: Three times a day (TID) | ORAL | Status: DC

## 2017-06-13 MED ORDER — SODIUM CHLORIDE 0.9 % IV SOLN
250.0000 mL | INTRAVENOUS | Status: DC | PRN
Start: 2017-06-13 — End: 2017-06-13

## 2017-06-13 MED ORDER — LIDOCAINE HCL 1 % IJ SOLN
INTRAMUSCULAR | Status: AC
  Filled 2017-06-13: qty 20

## 2017-06-13 MED ORDER — HYDRALAZINE HCL 10 MG PO TABS: 10.0000 mg | ORAL_TABLET | Freq: Three times a day (TID) | ORAL | 3 refills | Status: DC

## 2017-06-13 MED ORDER — POTASSIUM CHLORIDE CRYS ER 20 MEQ PO TBCR: 20.0000 meq | EXTENDED_RELEASE_TABLET | Freq: Two times a day (BID) | ORAL | Status: DC

## 2017-06-13 MED ORDER — SODIUM CHLORIDE 0.9% FLUSH: 3.0000 mL | INTRAVENOUS | Status: DC | PRN

## 2017-06-13 MED ORDER — SODIUM CHLORIDE 0.9 % IV SOLN: INTRAVENOUS | Status: AC

## 2017-06-13 MED ORDER — IOPAMIDOL (ISOVUE-370) INJECTION 76%
INTRAVENOUS | Status: DC | PRN
  Administered 2017-06-13: 60 mL via INTRA_ARTERIAL

## 2017-06-13 MED ORDER — FENTANYL CITRATE (PF) 100 MCG/2ML IJ SOLN
INTRAMUSCULAR | Status: DC | PRN
  Administered 2017-06-13: 25 ug via INTRAVENOUS

## 2017-06-13 MED ORDER — HEPARIN (PORCINE) IN NACL 2-0.9 UNIT/ML-% IJ SOLN
INTRAMUSCULAR | Status: AC
  Filled 2017-06-13: qty 1000

## 2017-06-13 SURGICAL SUPPLY — 8 items
CATH INFINITI 5FR MULTPACK ANG (CATHETERS) ×2 IMPLANT
KIT HEART LEFT (KITS) ×2 IMPLANT
PACK CARDIAC CATHETERIZATION (CUSTOM PROCEDURE TRAY) ×2 IMPLANT
SHEATH AVANTI 11CM 5FR (SHEATH) ×2 IMPLANT
SYR MEDRAD MARK V 150ML (SYRINGE) ×2 IMPLANT
TRANSDUCER W/STOPCOCK (MISCELLANEOUS) ×2 IMPLANT
WIRE EMERALD 3MM-J .035X150CM (WIRE) ×2 IMPLANT
WIRE HYDRO ST .014 135CM (WIRE) IMPLANT

## 2017-06-13 NOTE — Plan of Care (Deleted)
Plan for Thoracentesis today and then discharge this afternoon.

## 2017-06-13 NOTE — Progress Notes (Signed)
ANTICOAGULATION CONSULT NOTE Pharmacy Consult for Heparin Indication: chest pain/ACS  Allergies  Allergen Reactions  . Atorvastatin Other (See Comments)    unknown  . Metoprolol Tartrate Swelling  . Rosuvastatin Calcium Other (See Comments)    unknown    Patient Measurements: Height: 5' 2.5" (158.8 cm) Weight: 189 lb 11.2 oz (86 kg) IBW/kg (Calculated) : 51.25 Heparin Dosing Weight: 70.8 kg  Vital Signs: Temp: 98 F (36.7 C) (03/21 0652) Temp Source: Oral (03/21 0652) BP: 148/61 (03/21 0652) Pulse Rate: 46 (03/21 0652)  Labs: Recent Labs    06/12/17 1930 06/13/17 0057 06/13/17 0430  HGB 11.6*  --  10.9*  HCT 35.8*  --  33.7*  PLT 270  --  248  LABPROT  --   --  14.0  INR  --   --  1.09  HEPARINUNFRC  --   --  0.46  CREATININE 1.07*  --   --   TROPONINI <0.03 <0.03  --     Estimated Creatinine Clearance: 48.9 mL/min (A) (by C-G formula based on SCr of 1.07 mg/dL (H)).  Assessment: 72 y.o. female with chest pain for heparin   Goal of Therapy:  Heparin level 0.3-0.7 units/ml Monitor platelets by anticoagulation protocol: Yes   Plan:  Continue Heparin at current rate  Follow up after cath today  Geannie RisenGreg Jarryn Altland, PharmD, BCPS  06/13/2017 7:22 AM

## 2017-06-13 NOTE — Progress Notes (Signed)
Site area: RFA Site Prior to Removal:  Level 0 Pressure Applied For:28 min Manual:   yes Patient Status During Pull:  stable Post Pull Site:  Level 0 Post Pull Instructions Given:yes   Post Pull Pulses Present: palpable Dressing Applied:  tegaderm Bedrest begins @ 0915 till 1315 Comments: HR down to 39 , SBP to 98- Slow spontaneous recovery-asymptomatic Throughout. Pt does not remember why she is  In the hospital nor when she came in. Baseline Mental status unknown. Will call receiving RN .

## 2017-06-13 NOTE — Discharge Summary (Signed)
Physician Discharge Summary  Patient ID: [name] MRN: [id number] DOB/AGE: [birth year] 72 y.o.  Admit date: 06/12/2017 Discharge date: 06/13/2017  Admission Diagnoses: Acute coronary syndrome Hypertension Type 2 DM Hyperlipidemia  Discharge Diagnoses:  Principal Problem: * Unstable angina * Active Problems: Multivessel, native vessel CAD Essential hypertension Type 2 DM Hyperlipidemia Obesity Sinus bradycardia  Discharged Condition: fair  Hospital Course: 72 year old female has recurrent substernal chest pain with left arm numbness and exertional dyspnea. She had abnormal nuclear stress test 5 months ago. She underwent cardiac catheterization this AM showing mild to moderate multivessel CAD and significant ostial stenosis in small calibre Diagonal 1 branch of LAD. She will be treated medically.  Her cilostazol was discontinued and Clopidogrel was started. Hydralazine small dose was added for blood pressure control with small positive chronotropic effect on baseline sinus bradycardia. She discharged in stable condition. She will see me in 1 week and by primary care physician, Dr. Maggie Font. Sanders in 1-2 weeks.  Consults: cardiology  Significant Diagnostic Studies: labs: BMET and CBC near normal with mild anemia.  Angiography: Coronary : Mild multivessel, native vessel CAD except significant ostial stenosis of small calibre diagonal 1 of LAD. LV function was preserved.  Treatments: cardiac meds: Aspirin, Plavix, Olmesartan, amlodipine, Isosorbide, furosemide, potassium and hydralazine.  EKG-SB, LVH.  Discharge Exam: Blood pressure (!) 132/53, pulse 62, temperature 98.5 F (36.9 C), temperature source Oral, resp. rate 18, height 5' 2.5" (1.588 m), weight 86 kg (189 lb 11.2 oz), SpO2 97 %. General appearance: alert, cooperative and appears stated age. Head: Normocephalic, atraumatic. Eyes: Brown eyes, pink conjunctiva, corneas clear. PERRL, EOM's intact.  Neck: No adenopathy, no  carotid bruit, no JVD, supple, symmetrical, trachea midline and thyroid not enlarged. Resp: Clear to auscultation bilaterally. Cardio: Regular rate and rhythm, S1, S2 normal, II/VI systolic murmur, no click, rub or gallop. GI: Soft, non-tender; bowel sounds normal; no organomegaly. Extremities: No edema, cyanosis or clubbing. Skin: Warm and dry.  Neurologic: Alert and oriented X 3, normal strength and tone. Normal coordination and gait.  Disposition: Discharge disposition: 01-Home or Self Care        Allergies as of 06/13/2017      Reactions   Atorvastatin Other (See Comments)   unknown   Metoprolol Tartrate Swelling   Rosuvastatin Calcium Other (See Comments)   unknown      Medication List    STOP taking these medications   b complex vitamins capsule   cilostazol 100 MG tablet Commonly known as:  PLETAL   Cinnamon 500 MG capsule   omega-3 acid ethyl esters 1 g capsule Commonly known as:  LOVAZA   vitamin C 250 MG tablet Commonly known as:  ASCORBIC ACID     TAKE these medications   albuterol 108 (90 Base) MCG/ACT inhaler Commonly known as:  PROVENTIL HFA;VENTOLIN HFA Inhale 2 puffs into the lungs every 6 (six) hours as needed for wheezing or shortness of breath.   amLODipine 5 MG tablet Commonly known as:  NORVASC Take 5 mg by mouth daily.   aspirin 81 MG tablet Take 81 mg by mouth daily.   bisacodyl 5 MG EC tablet Generic drug:  bisacodyl Take 5 mg by mouth as needed for mild constipation or moderate constipation.   brimonidine 0.1 % Soln Commonly known as:  ALPHAGAN P Place 1 drop into both eyes every 8 (eight) hours.   calcium gluconate 500 MG tablet Take 1 tablet by mouth 2 (two) times daily.   clopidogrel  75 MG tablet Commonly known as:  PLAVIX Take 1 tablet (75 mg total) by mouth daily.   co-enzyme Q-10 30 MG capsule Take 60 mg by mouth daily.   furosemide 20 MG tablet Commonly known as:  LASIX Take 20 mg by mouth daily.   hydrALAZINE  10 MG tablet Commonly known as:  APRESOLINE Take 1 tablet (10 mg total) by mouth every 8 (eight) hours.   insulin glargine 100 UNIT/ML injection Commonly known as:  LANTUS Inject 11 Units into the skin See admin instructions. Per Sliding Scale   isosorbide mononitrate 60 MG 24 hr tablet Commonly known as:  IMDUR Take 60 mg by mouth daily.   levothyroxine 125 MCG tablet Commonly known as:  SYNTHROID, LEVOTHROID Take 125 mcg by mouth See admin instructions. 125 mcg by mouth daily on Mon thru Friday   multivitamin with minerals tablet Take 1 tablet by mouth daily.   nitroGLYCERIN 0.4 MG SL tablet Commonly known as:  NITROSTAT Place 1 tablet (0.4 mg total) under the tongue every 5 (five) minutes x 3 doses as needed for chest pain.   olmesartan 40 MG tablet Commonly known as:  BENICAR Take 40 mg by mouth daily.   potassium chloride SA 20 MEQ tablet Commonly known as:  K-DUR,KLOR-CON Take 1 tablet (20 mEq total) by mouth 2 (two) times daily.   psyllium 95 % Pack Commonly known as:  HYDROCIL/METAMUCIL Take 1 packet by mouth as needed for mild constipation.   travoprost (benzalkonium) 0.004 % ophthalmic solution Commonly known as:  TRAVATAN Place 1 drop into both eyes at bedtime.   Benay Spice OP Place 1 drop into both eyes 2 (two) times daily.      Follow-up Information    Orpah Cobb, MD. Schedule an appointment as soon as possible for a visit in 1 week(s).   Specialty:  Cardiology Contact information: [address] [place] [address] [phone]           Signed: Ricki Rodriguez 06/13/2017, 5:43 PM

## 2017-06-13 NOTE — Interval H&P Note (Signed)
History and Physical Interval Note:  06/13/2017 7:33 AM  [name]  has presented today for surgery, with the diagnosis of unstable angina  The various methods of treatment have been discussed with the patient and family. After consideration of risks, benefits and other options for treatment, the patient has consented to  Procedure(s): LEFT HEART CATH AND CORONARY ANGIOGRAPHY (N/A) as a surgical intervention .  The patient's history has been reviewed, patient examined, no change in status, stable for surgery.  I have reviewed the patient's chart and labs.  Questions were answered to the patient's satisfaction.     Ricki RodriguezAjay S Gwendolen Hewlett

## 2017-06-13 NOTE — Progress Notes (Signed)
Dr Algie CofferKadakia notified as to Pt's memory loss. Hand grasps and dorsiflexion strong and equal. VS stable.

## 2017-06-14 MED FILL — Lidocaine HCl Local Inj 1%: INTRAMUSCULAR | Qty: 20 | Status: AC

## 2017-06-14 MED FILL — Heparin Sodium (Porcine) 2 Unit/ML in Sodium Chloride 0.9%: INTRAMUSCULAR | Qty: 1000 | Status: AC

## 2017-10-16 ENCOUNTER — Encounter: Payer: Self-pay | Admitting: Nurse Practitioner

## 2017-10-16 LAB — HEMOGLOBIN A1C: HEMOGLOBIN A1C: 5.9 % — AB (ref 4.0–5.6)

## 2017-10-17 NOTE — Progress Notes (Signed)
[facility]  PATIENT: [name] DOB: 1946-01-14   REASON FOR VISIT: Follow-up for obstructive sleep apnea with CPAP HISTORY FROM: Patient    HISTORY OF PRESENT ILLNESS:UPDATE 7/29/2019CM Ms [name], 72 year old female returns for follow-up with history of obstructive sleep apnea here for CPAP compliance.  She is not having any problems with her CPAP. Compliance data dated 09/17/2017-10/16/2017 shows compliance greater than 4 hours at 97%.  Average usage 5 hours 47 minutes.  Set pressure 5 to 12 cm.  EPR level 3 AHI 2.2 ESS 4.  She reports a heart stent in May of this year.  She returns for reevaluation  Interval history from 10/15/2016. The patient had severe sleep apnea, during REM sleep the AHI was 54 in supine sleep 43.5 and she had prolonged oxygen desaturations. She has been very compliant with CPAP . I had the pleasure of seeing Catherine. Carma Lair today, who has been a compliant CPAP user for 93% of the last 30 days. She only used the machine one out of 30 days for less than 4 hours. Average user time is 6 hours and 41 minutes, she is continuing to use an AutoSet between 5 and 12 cm water with 3 cm EPR, residual AHI is only 2.4 with all apneas being obstructive. The 95th percentile pressure is 11.2 and she has moderate air leaks. It is possible that he could reduce the AHI even further by increasing the pressure window to 13 cm water. I will discuss with the patient what this means for her she has endorsed a very low fatigue severity and only 6 points on the Epworth sleepiness score, and only 2 out of 15 points on the depression score. CPAP therapy has benefited the patient. She agrees with increasing the pressure to 13 cm auto pap. She is using a nasal pillow- she did not like dream wear.  The patient has achieved some weight loss since she has been on CPAP, her hypertension has improved, her daytime alertness has improved. She no longer has insomnia.    HPI:   [name] is a 72 y.o. female , seen here as a referral  from Dr. Allyne Gee for a sleep evaluation, This right handed aa female patient reports that her husband witnessed her snoring and she was referred for evaluation of sleep apnea. She wakes up frequently, complaining of fragmented sleep. Her sleep quality feels poor, she is not restored or refreshed .  "when I get really tired is when I cannot sleep" " I am easily startled-Jumpy- and anxious ", her insomnia  problems are present for more than 10 years.  Catherine. Agent's physician also noted that the patient may have this ongoing sleep problems were longer than a decade, and that she has the comorbidities of chronic kidney disease stage II, hypertensive renal disease, hypercholesterolemia, diabetes and renal disease. Obesity, hypothyroidism as well as gastroesophageal reflux disease. She is currently taking calcium, aspirin, etodolac, as needed nitroglycerin sublingual, vitamin D, Prilosec, Restasis eyedrops, magnesium over-the-counter, biotin over-the-counter, metoprolol extended release, Travatan eyedrops, Tramadol  as needed for pain, seroquel 100 mg at night for sleep. Niaspan normal Klor-Con, and isosorbide mononitrate 60 mg ER, Synthroid 125 g, vitamin D and folic acid supplement. She had thyoidectomy in 1979  Sleep habits are as follows:  She goes to bed at 4 AM, that is until January when she changed the time to 11 PM, but she can only sleep when the TV is running in the background. She may  go to sleep within 30 -60 minutes , only with seroquel. She reports that her feet tingle and that also prolongs her sleep latency she feels the irresistible urge to move either walk or massage or rub her legs. With the help of Seroquel she will sleep for about 4-5 hours sometimes even 6. Without she is either unable to enter sleep or maintain sleep at all. She keeps her bedroom cool with a fan running, she usually switches the TV off by sleep timer, it is  dark after that. Her husband has told her that she snores but she is probably equally disturbed by which she reports to be his sleeping pattern with louder snoring, significant apneas, and her husband often wakes her when he comes to the bedroom later than she. The patient sleeps on 2 pillows on her stomach.When she wakes she is usually still on her stomach, avoiding back pain. She wake sometimes with headaches, but is never woken by pain.  She does not nap in daytime.Sleep medical history and family sleep history:   REVIEW OF SYSTEMS: Full 14 system review of systems performed and notable only for those listed, all others are neg:  Constitutional: neg  Cardiovascular: neg Ear/Nose/Throat: neg  Skin: neg Eyes: neg Respiratory: neg Gastroitestinal: neg  Hematology/Lymphatic: Easy bruising Endocrine: neg Musculoskeletal:neg Allergy/Immunology: neg Neurological: neg Psychiatric: neg Sleep : Obstructive sleep apnea with CPAP   ALLERGIES: Allergies  Allergen Reactions  . Atorvastatin Other (See Comments)    unknown  . Metoprolol Tartrate Swelling  . Rosuvastatin Calcium Other (See Comments)    unknown    HOME MEDICATIONS: Outpatient Medications Prior to Visit  Medication Sig Dispense Refill  . albuterol (PROVENTIL HFA;VENTOLIN HFA) 108 (90 Base) MCG/ACT inhaler Inhale 2 puffs into the lungs every 6 (six) hours as needed for wheezing or shortness of breath. 1 Inhaler 2  . amLODipine (NORVASC) 5 MG tablet Take 5 mg by mouth daily.  1  . aspirin 81 MG tablet Take 81 mg by mouth daily.    . brimonidine (ALPHAGAN P) 0.1 % SOLN Place 1 drop into both eyes every 8 (eight) hours.    . calcium gluconate 500 MG tablet Take 1 tablet by mouth 2 (two) times daily.     . clopidogrel (PLAVIX) 75 MG tablet Take 1 tablet (75 mg total) by mouth daily. 30 tablet 3  . co-enzyme Q-10 30 MG capsule Take 60 mg by mouth daily.    . furosemide (LASIX) 20 MG tablet Take 20 mg by mouth daily.     .  hydrALAZINE (APRESOLINE) 10 MG tablet Take 1 tablet (10 mg total) by mouth every 8 (eight) hours. 90 tablet 3  . isosorbide mononitrate (IMDUR) 60 MG 24 hr tablet Take 60 mg by mouth daily.    [name] levothyroxine (SYNTHROID, LEVOTHROID) 125 MCG tablet Take 125 mcg by mouth See admin instructions. 125 mcg by mouth daily on Mon thru Friday    . Multiple Vitamins-Minerals (MULTIVITAMIN WITH MINERALS) tablet Take 1 tablet by mouth daily.    . nitroGLYCERIN (NITROSTAT) 0.4 MG SL tablet Place 1 tablet (0.4 mg total) under the tongue every 5 (five) minutes x 3 doses as needed for chest pain. 25 tablet 1  . olmesartan (BENICAR) 40 MG tablet Take 40 mg by mouth daily.  1  . OZEMPIC 0.25 or 0.5 MG/DOSE SOPN INJECT 0.5MG  INTO SKIN EVERY WEEK, IN THE ABDOMEN, THIGHS, OR UPPER ARM ROTATING INJECTION SITES  1  . potassium chloride SA (K-DUR,KLOR-CON) 20  MEQ tablet Take 1 tablet (20 mEq total) by mouth 2 (two) times daily.    . psyllium (HYDROCIL/METAMUCIL) 95 % PACK Take 1 packet by mouth as needed for mild constipation.    . travoprost, benzalkonium, (TRAVATAN) 0.004 % ophthalmic solution Place 1 drop into both eyes at bedtime.     . bisacodyl (BISACODYL) 5 MG EC tablet Take 5 mg by mouth as needed for mild constipation or moderate constipation.    [name]. Lifitegrast (XIIDRA OP) Place 1 drop into both eyes 2 (two) times daily.    . insulin glargine (LANTUS) 100 UNIT/ML injection Inject 11 Units into the skin See admin instructions. Per Sliding Scale     No facility-administered medications prior to visit.     PAST MEDICAL HISTORY: Past Medical History:  Diagnosis Date  . Benign hypertensive renal disease   . CKD stage 2 due to type 2 diabetes mellitus (HCC)   . Hypertension   . Hypothyroidism   . Obesity   . OSA on CPAP   . Pure hypercholesterolemia   . PVD (peripheral vascular disease) (HCC)   . Vitamin D deficiency     PAST SURGICAL HISTORY: Past Surgical History:  Procedure Laterality Date  .  ABDOMINAL HYSTERECTOMY    . BREAST CYST EXCISION Right over 20 yrs  . broken leg and displaced ankle    . DILATION AND CURETTAGE OF UTERUS    . LEFT HEART CATH AND CORONARY ANGIOGRAPHY N/A 06/13/2017   Procedure: LEFT HEART CATH AND CORONARY ANGIOGRAPHY;  Surgeon: Orpah CobbKadakia, Ajay, MD;  Location: [facility];  Service: Cardiovascular;  Laterality: N/A;  . right and left rotator cuff    . THYROIDECTOMY      FAMILY HISTORY: Family History  Problem Relation Age of Onset  . Alzheimer's disease Mother   . Aneurysm Father   . Cancer Brother     SOCIAL HISTORY: Social History   Socioeconomic History  . Marital status: Married    Spouse name: Not on file  . Number of children: Not on file  . Years of education: Not on file  . Highest education level: Not on file  Occupational History  . Occupation: retired  Engineer, productionocial Needs  . Financial resource strain: Not on file  . Food insecurity:    Worry: Not on file    Inability: Not on file  . Transportation needs:    Medical: Not on file    Non-medical: Not on file  Tobacco Use  . Smoking status: Never Smoker  . Smokeless tobacco: Never Used  Substance and Sexual Activity  . Alcohol use: No    Alcohol/week: 0.0 oz  . Drug use: No  . Sexual activity: Not on file  Lifestyle  . Physical activity:    Days per week: Not on file    Minutes per session: Not on file  . Stress: Not on file  Relationships  . Social connections:    Talks on phone: Not on file    Gets together: Not on file    Attends religious service: Not on file    Active member of club or organization: Not on file    Attends meetings of clubs or organizations: Not on file    Relationship status: Not on file  . Intimate partner violence:    Fear of current or ex partner: Not on file    Emotionally abused: Not on file    Physically abused: Not on file    Forced sexual activity: Not  on file  Other Topics Concern  . Not on file  Social History Narrative   Drinks  1-2 cups daily of coffee daily.     PHYSICAL EXAM  Vitals:   10/21/17 0857  BP: 114/67  Pulse: (!) 46  Height: 5' 2.5" (1.588 m)   Body mass index is 34.14 kg/m.  Generalized: Well developed, obese female in no acute distress  Head: normocephalic and atraumatic,. Oropharynx benign mallopatti 4 Neck: Supple, circumference 15 point Lungs  clear Musculoskeletal  No deformity  Skin no peripheral edema Neurological examination   Mentation: Alert oriented to time, place, history taking. Attention span and concentration appropriate. Recent and remote memory intact.  Follows all commands speech and language fluent.   Cranial nerve II-XII: Pupils were equal round reactive to light extraocular movements were full, visual field were full on confrontational test. Facial sensation and strength were normal. hearing was intact to finger rubbing bilaterally. Uvula tongue midline. head turning and shoulder shrug were normal and symmetric.Tongue protrusion into cheek strength was normal. Motor: normal bulk and tone, full strength in the BUE, BLE,  Sensory: normal and symmetric to light touch, Coordination: finger-nose-finger, heel-to-shin bilaterally, no dysmetria Gait and Station: Rising up from seated position without assistance, normal stance,  moderate stride, good arm swing, smooth turning, able to perform tiptoe, and heel walking without difficulty. Tandem gait is steady.  No assistive device  DIAGNOSTIC DATA (LABS, IMAGING, TESTING) - I reviewed patient records, labs, notes, testing and imaging myself where available.  Lab Results  Component Value Date   WBC 4.9 06/13/2017   HGB 10.9 (L) 06/13/2017   HCT 33.7 (L) 06/13/2017   MCV 91.3 06/13/2017   PLT 248 06/13/2017      Component Value Date/Time   NA 139 06/13/2017 0612   K 4.0 06/13/2017 0612   CL 105 06/13/2017 0612   CO2 25 06/13/2017 0612   GLUCOSE 97 06/13/2017 0612   BUN 9 06/13/2017 0612   CREATININE 1.04 (H) 06/13/2017  0612   CALCIUM 9.4 06/13/2017 0612   PROT 6.3 (L) 06/12/2017 1930   ALBUMIN 3.8 06/12/2017 1930   AST 49 (H) 06/12/2017 1930   ALT 26 06/12/2017 1930   ALKPHOS 59 06/12/2017 1930   BILITOT 0.3 06/12/2017 1930   GFRNONAA 52 (L) 06/13/2017 0612   GFRAA >60 06/13/2017 0612   Lab Results  Component Value Date   CHOL 175 06/13/2017   HDL 60 06/13/2017   LDLCALC 106 (H) 06/13/2017   TRIG 44 06/13/2017   CHOLHDL 2.9 06/13/2017   Lab Results  Component Value Date   HGBA1C 6.1 (H) 12/26/2016   No results found for: VITAMINB12 Lab Results  Component Value Date   TSH 0.997 07/18/2015      ASSESSMENT AND PLAN  72 y.o. year old female  has a past medical history of obstructive sleep apnea here to follow-up for CPAP compliance. Data dated 09/17/2017-10/16/2017 shows compliance greater than 4 hours at 97%.  Average usage 5 hours 47 minutes.  Set pressure 5 to 12 cm.  EPR level 3 AHI 2.2 ESS 4.  PLAN: CPAP compliance 97% Will order head strap supply Continue same settings Follow-up yearly and as needed Nilda Riggs, [facility], [facility], APRN  [facility] [address] [place], [place] [address] [phone]

## 2017-10-21 ENCOUNTER — Ambulatory Visit: Payer: Medicare Other | Admitting: Nurse Practitioner

## 2017-10-21 ENCOUNTER — Encounter: Payer: Self-pay | Admitting: Nurse Practitioner

## 2017-10-21 VITALS — BP 114/67 | HR 46 | Ht 62.5 in

## 2017-10-21 DIAGNOSIS — G4733 Obstructive sleep apnea (adult) (pediatric): Secondary | ICD-10-CM | POA: Diagnosis not present

## 2017-10-21 DIAGNOSIS — Z9989 Dependence on other enabling machines and devices: Secondary | ICD-10-CM

## 2017-10-21 NOTE — Patient Instructions (Signed)
CPAP compliance 97% Will order head strap supply Continue same settings Follow-up yearly and as needed

## 2017-10-21 NOTE — Progress Notes (Signed)
CPAP DME order re: needs new head strap faxed to [facility].

## 2017-11-27 NOTE — Progress Notes (Signed)
I agree with the assessment and plan as directed by NP .The patient is known to me .   Isella Slatten, MD  

## 2017-12-15 ENCOUNTER — Other Ambulatory Visit: Payer: Self-pay

## 2017-12-15 ENCOUNTER — Encounter (HOSPITAL_COMMUNITY): Payer: Self-pay | Admitting: Emergency Medicine

## 2017-12-15 ENCOUNTER — Emergency Department (HOSPITAL_COMMUNITY)
Admission: EM | Admit: 2017-12-15 | Discharge: 2017-12-15 | Disposition: A | Discharge status: Home / Self Care | Payer: Medicare Other | Attending: Emergency Medicine | Admitting: Emergency Medicine

## 2017-12-15 DIAGNOSIS — N182 Chronic kidney disease, stage 2 (mild): Secondary | ICD-10-CM | POA: Insufficient documentation

## 2017-12-15 DIAGNOSIS — I2510 Atherosclerotic heart disease of native coronary artery without angina pectoris: Secondary | ICD-10-CM | POA: Insufficient documentation

## 2017-12-15 DIAGNOSIS — Z79899 Other long term (current) drug therapy: Secondary | ICD-10-CM | POA: Insufficient documentation

## 2017-12-15 DIAGNOSIS — E871 Hypo-osmolality and hyponatremia: Secondary | ICD-10-CM | POA: Insufficient documentation

## 2017-12-15 DIAGNOSIS — Z7901 Long term (current) use of anticoagulants: Secondary | ICD-10-CM | POA: Diagnosis not present

## 2017-12-15 DIAGNOSIS — I129 Hypertensive chronic kidney disease with stage 1 through stage 4 chronic kidney disease, or unspecified chronic kidney disease: Secondary | ICD-10-CM | POA: Insufficient documentation

## 2017-12-15 DIAGNOSIS — R233 Spontaneous ecchymoses: Secondary | ICD-10-CM | POA: Insufficient documentation

## 2017-12-15 DIAGNOSIS — E039 Hypothyroidism, unspecified: Secondary | ICD-10-CM | POA: Diagnosis not present

## 2017-12-15 DIAGNOSIS — E1122 Type 2 diabetes mellitus with diabetic chronic kidney disease: Secondary | ICD-10-CM | POA: Diagnosis not present

## 2017-12-15 DIAGNOSIS — T148XXA Other injury of unspecified body region, initial encounter: Secondary | ICD-10-CM

## 2017-12-15 LAB — CBC WITH DIFFERENTIAL/PLATELET
ABS IMMATURE GRANULOCYTES: 0 10*3/uL (ref 0.0–0.1)
BASOS ABS: 0 10*3/uL (ref 0.0–0.1)
Basophils Relative: 1 %
EOS PCT: 2 %
Eosinophils Absolute: 0.1 10*3/uL (ref 0.0–0.7)
HEMATOCRIT: 34.6 % — AB (ref 36.0–46.0)
HEMOGLOBIN: 11.6 g/dL — AB (ref 12.0–15.0)
IMMATURE GRANULOCYTES: 0 %
LYMPHS ABS: 2.2 10*3/uL (ref 0.7–4.0)
LYMPHS PCT: 55 %
MCH: 30.5 pg (ref 26.0–34.0)
MCHC: 33.5 g/dL (ref 30.0–36.0)
MCV: 91.1 fL (ref 78.0–100.0)
Monocytes Absolute: 0.3 10*3/uL (ref 0.1–1.0)
Monocytes Relative: 8 %
NEUTROS ABS: 1.4 10*3/uL — AB (ref 1.7–7.7)
NEUTROS PCT: 34 %
Platelets: 286 10*3/uL (ref 150–400)
RBC: 3.8 MIL/uL — AB (ref 3.87–5.11)
RDW: 12.4 % (ref 11.5–15.5)
WBC: 4.1 10*3/uL (ref 4.0–10.5)

## 2017-12-15 LAB — BASIC METABOLIC PANEL
ANION GAP: 10 (ref 5–15)
BUN: <5 mg/dL — ABNORMAL LOW (ref 8–23)
CALCIUM: 9.6 mg/dL (ref 8.9–10.3)
CHLORIDE: 98 mmol/L (ref 98–111)
CO2: 22 mmol/L (ref 22–32)
Creatinine, Ser: 1 mg/dL (ref 0.44–1.00)
GFR calc non Af Amer: 55 mL/min — ABNORMAL LOW (ref >60)
GLUCOSE: 105 mg/dL — AB (ref 70–99)
Potassium: 4.1 mmol/L (ref 3.5–5.1)
Sodium: 130 mmol/L — ABNORMAL LOW (ref 135–145)

## 2017-12-15 LAB — PROTIME-INR
INR: 1.05
Prothrombin Time: 13.6 seconds (ref 11.4–15.2)

## 2017-12-15 NOTE — ED Notes (Signed)
Pt ambulated to restroom with steady gait.

## 2017-12-15 NOTE — ED Provider Notes (Signed)
[facility] EMERGENCY DEPARTMENT Provider Note   CSN: [id number] Arrival date & time: 12/15/17  1806     History   Chief Complaint Chief Complaint  Patient presents with  . bruises    HPI [name] is a 72 y.o. female.  72 y/o female with a PMH CKD,HTN, Hypothyroid presents to the ED with a chief complaint of bruising x 4 months. Patient reports she was started on clopidogrel in March after heart cath.I have personally reviewed patient's chart. She reports ever since the bruising started. She denies any therapy for her bruising. She denies any pain in her bruises.She denies any headache, bleeding in her urine, bleeding in her gums or falls. She denies any other complaints.      Past Medical History:  Diagnosis Date  . Benign hypertensive renal disease   . CKD stage 2 due to type 2 diabetes mellitus (HCC)   . Hypertension   . Hypothyroidism   . Obesity   . OSA on CPAP   . Pure hypercholesterolemia   . PVD (peripheral vascular disease) (HCC)   . Vitamin D deficiency     Patient Active Problem List   Diagnosis Date Noted  . Essential hypertension 06/12/2017  . Acute coronary syndrome (HCC) 12/26/2016  . Hypertensive heart disease without heart failure 10/06/2015  . Insomnia with sleep apnea 10/06/2015  . OSA on CPAP 10/06/2015  . 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 CAD in native artery 10/06/2015  . Chest pain at rest 07/18/2015    Class: Acute  . Weakness 07/18/2015    Class: Acute  . Ischemic chest pain 07/18/2015  . Insomnia 05/05/2015  . Primary snoring 05/05/2015    Past Surgical History:  Procedure Laterality Date  . ABDOMINAL HYSTERECTOMY    . BREAST CYST EXCISION Right over 20 yrs  . broken leg and displaced ankle    . DILATION AND CURETTAGE OF UTERUS    . LEFT HEART CATH AND CORONARY ANGIOGRAPHY N/A 06/13/2017   Procedure: LEFT HEART CATH AND CORONARY ANGIOGRAPHY;  Surgeon: Orpah Cobb, MD;  Location: [facility];  Service: Cardiovascular;  Laterality: N/A;  . right and left rotator cuff    . THYROIDECTOMY       OB History   None      Home Medications    Prior to Admission medications   Medication Sig Start Date End Date Taking? Authorizing Provider  albuterol (PROVENTIL HFA;VENTOLIN HFA) 108 (90 Base) MCG/ACT inhaler Inhale 2 puffs into the lungs every 6 (six) hours as needed for wheezing or shortness of breath. 07/22/15   Orpah Cobb, MD  amLODipine (NORVASC) 5 MG tablet Take 5 mg by mouth daily. 05/05/17   [provider]  aspirin 81 MG tablet Take 81 mg by mouth daily.    [provider]  bisacodyl (BISACODYL) 5 MG EC tablet Take 5 mg by mouth as needed for mild constipation or moderate constipation.    [provider]  brimonidine (ALPHAGAN P) 0.1 % SOLN Place 1 drop into both eyes every 8 (eight) hours.    [provider]  calcium gluconate 500 MG tablet Take 1 tablet by mouth 2 (two) times daily.     [provider]  clopidogrel (PLAVIX) 75 MG tablet Take 1 tablet (75 mg total) by mouth daily. 06/13/17   Orpah Cobb, MD  co-enzyme Q-10 30 MG capsule Take 60 mg by mouth daily.    [provider]  furosemide (  LASIX) 20 MG tablet Take 20 mg by mouth daily.     [provider]  hydrALAZINE (APRESOLINE) 10 MG tablet Take 1 tablet (10 mg total) by mouth every 8 (eight) hours. 06/13/17   Orpah Cobb, MD  isosorbide mononitrate (IMDUR) 60 MG 24 hr tablet Take 60 mg by mouth daily.    [provider]  levothyroxine (SYNTHROID, LEVOTHROID) 125 MCG tablet Take 125 mcg by mouth See admin instructions. 125 mcg by mouth daily on Mon thru Friday    [provider]  Lifitegrast Benay Spice OP) Place 1 drop into both eyes 2 (two) times daily.    [provider]  Multiple Vitamins-Minerals (MULTIVITAMIN WITH MINERALS) tablet Take 1 tablet by mouth daily.    [provider]  nitroGLYCERIN (NITROSTAT) 0.4 MG  SL tablet Place 1 tablet (0.4 mg total) under the tongue every 5 (five) minutes x 3 doses as needed for chest pain. 12/27/16   Orpah Cobb, MD  olmesartan (BENICAR) 40 MG tablet Take 40 mg by mouth daily. 04/16/17   [provider]  OZEMPIC 0.25 or 0.5 MG/DOSE SOPN INJECT 0.5MG  INTO SKIN EVERY WEEK, IN THE ABDOMEN, THIGHS, OR UPPER ARM ROTATING INJECTION SITES 08/15/17   [provider]  potassium chloride SA (K-DUR,KLOR-CON) 20 MEQ tablet Take 1 tablet (20 mEq total) by mouth 2 (two) times daily. 06/13/17   Orpah Cobb, MD  psyllium (HYDROCIL/METAMUCIL) 95 % PACK Take 1 packet by mouth as needed for mild constipation.    [provider]  travoprost, benzalkonium, (TRAVATAN) 0.004 % ophthalmic solution Place 1 drop into both eyes at bedtime.     [provider]    Family History Family History  Problem Relation Age of Onset  . Alzheimer's disease Mother   . Aneurysm Father   . Cancer Brother     Social History Social History   Tobacco Use  . Smoking status: Never Smoker  . Smokeless tobacco: Never Used  Substance Use Topics  . Alcohol use: No    Alcohol/week: 0.0 standard drinks  . Drug use: No     Allergies   Atorvastatin; Metoprolol tartrate; and Rosuvastatin calcium   Review of Systems Review of Systems  Constitutional: Negative for chills and fever.  Respiratory: Negative for chest tightness and shortness of breath.   Cardiovascular: Negative for chest pain.  Gastrointestinal: Negative for abdominal pain, diarrhea, nausea and vomiting.  Genitourinary: Negative for dysuria and flank pain.  Musculoskeletal: Negative for back pain.  Skin: Positive for color change. Negative for wound.  Neurological: Negative for dizziness, syncope, weakness, light-headedness and headaches.  Hematological: Bruises/bleeds easily.  All other systems reviewed and are negative.    Physical Exam Updated Vital Signs BP (!) 109/55   Pulse 70   Temp 99  F (37.2 C) (Oral)   Resp 18   SpO2 97%   Physical Exam  Constitutional: She is oriented to person, place, and time. She appears well-developed and well-nourished.  HENT:  Head: Normocephalic and atraumatic.  Neck: Normal range of motion. Neck supple.  Cardiovascular: Normal heart sounds.  Pulmonary/Chest: Effort normal and breath sounds normal. She has no wheezes.  Abdominal: Soft. Bowel sounds are normal. There is no tenderness.  Musculoskeletal: She exhibits no tenderness or deformity.  Neurological: She is alert and oriented to person, place, and time.  Skin: Skin is warm and dry. Bruising noted. No rash noted.     Large bruise along right arm, bruising noted to right knee.   Nursing  note and vitals reviewed.    ED Treatments / Results  Labs (all labs ordered are listed, but only abnormal results are displayed) Labs Reviewed  BASIC METABOLIC PANEL - Abnormal; Notable for the following components:      Result Value   Sodium 130 (*)    Glucose, Bld 105 (*)    BUN <5 (*)    GFR calc non Af Amer 55 (*)    All other components within normal limits  CBC WITH DIFFERENTIAL/PLATELET - Abnormal; Notable for the following components:   RBC 3.80 (*)    Hemoglobin 11.6 (*)    HCT 34.6 (*)    Neutro Abs 1.4 (*)    All other components within normal limits  PROTIME-INR    EKG None  Radiology No results found.  Procedures Procedures (including critical care time)  Medications Ordered in ED Medications - No data to display   Initial Impression / Assessment and Plan / ED Course  I have reviewed the triage vital signs and the nursing notes.  Pertinent labs & imaging results that were available during my care of the patient were reviewed by me and considered in my medical decision making (see chart for details).     Patient presents with bruising after starting clopidogrel in March after heart cath, she reports no pain to her bruising and has not tried any therapy. CBC  showed hemoglobin 11.6 consistent with patient's previous results, BMP showed sodium 130, I have reported these changes to patient and advised her to follow up with PCP and have blood redrawn, patient understands plan. PT/INR are stable.Patient has been asymptomatic during ED visit, I have advised her to see her PCP this week instead of November.She denies any chest pain, weakness, shortness of breath.   Final Clinical Impressions(s) / ED Diagnoses   Final diagnoses:  Bruising  Hyponatremia    ED Discharge Orders    None       Claude MangesSoto, Jayston Trevino, PA-C 12/15/17 2233    Rolan BuccoBelfi, Melanie, MD 12/16/17 0001

## 2017-12-15 NOTE — Discharge Instructions (Addendum)
Please follow up with PCP this week, have them recheck your sodium level as it was increased during this visit. If you experience any shortness of breath, chest pain, bleeding in your gums, bleeding in your urine please return to the ED for reevaluation.

## 2017-12-15 NOTE — ED Triage Notes (Signed)
C/o intermittent bruising to bilateral arms and legs since May.  Denies pain or injury.  Large bruise to R upper arm at this time.

## 2017-12-21 LAB — POCT GLYCOSYLATED HEMOGLOBIN (HGB A1C): HEMOGLOBIN A1C: 5.6 % (ref 4.0–5.6)

## 2017-12-24 ENCOUNTER — Other Ambulatory Visit: Payer: Self-pay

## 2017-12-24 ENCOUNTER — Emergency Department (HOSPITAL_COMMUNITY)
Admission: EM | Admit: 2017-12-24 | Discharge: 2017-12-24 | Disposition: A | Discharge status: Home / Self Care | Payer: Medicare Other | Attending: Emergency Medicine | Admitting: Emergency Medicine

## 2017-12-24 ENCOUNTER — Encounter (HOSPITAL_COMMUNITY): Payer: Self-pay

## 2017-12-24 ENCOUNTER — Emergency Department (HOSPITAL_COMMUNITY): Payer: Medicare Other

## 2017-12-24 DIAGNOSIS — Z7902 Long term (current) use of antithrombotics/antiplatelets: Secondary | ICD-10-CM | POA: Diagnosis not present

## 2017-12-24 DIAGNOSIS — Z79899 Other long term (current) drug therapy: Secondary | ICD-10-CM | POA: Insufficient documentation

## 2017-12-24 DIAGNOSIS — E039 Hypothyroidism, unspecified: Secondary | ICD-10-CM | POA: Insufficient documentation

## 2017-12-24 DIAGNOSIS — M25561 Pain in right knee: Secondary | ICD-10-CM | POA: Diagnosis present

## 2017-12-24 DIAGNOSIS — M129 Arthropathy, unspecified: Secondary | ICD-10-CM | POA: Insufficient documentation

## 2017-12-24 DIAGNOSIS — I2510 Atherosclerotic heart disease of native coronary artery without angina pectoris: Secondary | ICD-10-CM | POA: Diagnosis not present

## 2017-12-24 DIAGNOSIS — I129 Hypertensive chronic kidney disease with stage 1 through stage 4 chronic kidney disease, or unspecified chronic kidney disease: Secondary | ICD-10-CM | POA: Insufficient documentation

## 2017-12-24 DIAGNOSIS — N182 Chronic kidney disease, stage 2 (mild): Secondary | ICD-10-CM | POA: Diagnosis not present

## 2017-12-24 DIAGNOSIS — Z7982 Long term (current) use of aspirin: Secondary | ICD-10-CM | POA: Diagnosis not present

## 2017-12-24 MED ORDER — TRAMADOL HCL 50 MG PO TABS: 50.0000 mg | ORAL_TABLET | Freq: Four times a day (QID) | ORAL | 0 refills | Status: DC | PRN

## 2017-12-24 MED ORDER — TRAMADOL HCL 50 MG PO TABS
50.0000 mg | ORAL_TABLET | Freq: Once | ORAL | Status: AC
  Administered 2017-12-24: 50 mg via ORAL
  Filled 2017-12-24: qty 1

## 2017-12-24 MED ORDER — IBUPROFEN 200 MG PO TABS
400.0000 mg | ORAL_TABLET | Freq: Once | ORAL | Status: AC
  Administered 2017-12-24: 400 mg via ORAL
  Filled 2017-12-24: qty 2

## 2017-12-24 NOTE — Discharge Instructions (Addendum)
It was our pleasure to provide your ER care today - we hope that you feel better.  Icepack/cold to sore area. Rest knee/stay off knee/elevate for the next couple days.   Take ibuprofen as need for pain.   You may also take ultram as need for pain - no driving when taking.   Follow up with orthopedist in the coming week for recheck - call office to arrange appointment.   Return to ER if worse, new symptoms, fevers, redness to knee, intractable pain, other concern.

## 2017-12-24 NOTE — ED Triage Notes (Signed)
Patient states she "had a cramp " in her right knee last night and was unable to move her right knee. Patient states movement is less and pain increased.

## 2017-12-24 NOTE — ED Provider Notes (Addendum)
[facility]-EMERGENCY DEPT Provider Note   CSN: [id number] Arrival date & time: 12/24/17  0737     History   Chief Complaint Chief Complaint  Patient presents with  . Knee Pain    HPI [name] is a 72 y.o. female.  Patient c/o right knee pain for the past couple days. Pain constant, dull, mod-severe, non-radiating, worse w weight bearing and movement knee. Denies injury, fall or strain to area. Denies hx chronic knee pain. No hip or ankle pain. Denies hx djd, gout or arthritis. No redness to area or skin lesions/rash. No radicular pain. No numbness/weakness. No fever or chills.   The history is provided by the patient.  Knee Pain   Pertinent negatives include no numbness.    Past Medical History:  Diagnosis Date  . Benign hypertensive renal disease   . CKD stage 2 due to type 2 diabetes mellitus (HCC)   . Hypertension   . Hypothyroidism   . Obesity   . OSA on CPAP   . Pure hypercholesterolemia   . PVD (peripheral vascular disease) (HCC)   . Vitamin D deficiency     Patient Active Problem List   Diagnosis Date Noted  . Essential hypertension 06/12/2017  . Acute coronary syndrome (HCC) 12/26/2016  . Hypertensive heart disease without heart failure 10/06/2015  . Insomnia with sleep apnea 10/06/2015  . OSA on CPAP 10/06/2015  . 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 CAD in native artery 10/06/2015  . Chest pain at rest 07/18/2015    Class: Acute  . Weakness 07/18/2015    Class: Acute  . Ischemic chest pain (HCC) 07/18/2015  . Insomnia 05/05/2015  . Primary snoring 05/05/2015    Past Surgical History:  Procedure Laterality Date  . ABDOMINAL HYSTERECTOMY    . BREAST CYST EXCISION Right over 20 yrs  . broken leg and displaced ankle    . DILATION AND CURETTAGE OF UTERUS    . LEFT HEART CATH AND CORONARY ANGIOGRAPHY N/A 06/13/2017   Procedure: LEFT HEART CATH AND CORONARY ANGIOGRAPHY;  Surgeon: Orpah Cobb, MD;  Location: [facility]  INVASIVE CV LAB;  Service: Cardiovascular;  Laterality: N/A;  . right and left rotator cuff    . THYROIDECTOMY       OB History   None      Home Medications    Prior to Admission medications   Medication Sig Start Date End Date Taking? Authorizing Provider  albuterol (PROVENTIL HFA;VENTOLIN HFA) 108 (90 Base) MCG/ACT inhaler Inhale 2 puffs into the lungs every 6 (six) hours as needed for wheezing or shortness of breath. 07/22/15   Orpah Cobb, MD  amLODipine (NORVASC) 5 MG tablet Take 5 mg by mouth daily. 05/05/17   [provider]  aspirin 81 MG tablet Take 81 mg by mouth daily.    [provider]  bisacodyl (BISACODYL) 5 MG EC tablet Take 5 mg by mouth as needed for mild constipation or moderate constipation.    [provider]  brimonidine (ALPHAGAN P) 0.1 % SOLN Place 1 drop into both eyes every 8 (eight) hours.    [provider]  calcium gluconate 500 MG tablet Take 1 tablet by mouth 2 (two) times daily.     [provider]  clopidogrel (PLAVIX) 75 MG tablet Take 1 tablet (75 mg total) by mouth daily. 06/13/17   Orpah Cobb, MD  co-enzyme Q-10 30 MG capsule Take 60 mg by mouth daily.    [provider]  furosemide (LASIX) 20 MG tablet Take 20 mg by mouth daily.     [provider]  hydrALAZINE (APRESOLINE) 10 MG tablet Take 1 tablet (10 mg total) by mouth every 8 (eight) hours. 06/13/17   Orpah Cobb, MD  isosorbide mononitrate (IMDUR) 60 MG 24 hr tablet Take 60 mg by mouth daily.    [provider]  levothyroxine (SYNTHROID, LEVOTHROID) 125 MCG tablet Take 125 mcg by mouth See admin instructions. 125 mcg by mouth daily on Mon thru Friday    [provider]  Lifitegrast Benay Spice OP) Place 1 drop into both eyes 2 (two) times daily.    [provider]  Multiple Vitamins-Minerals (MULTIVITAMIN WITH MINERALS) tablet Take 1 tablet by mouth daily.    [provider]  nitroGLYCERIN  (NITROSTAT) 0.4 MG SL tablet Place 1 tablet (0.4 mg total) under the tongue every 5 (five) minutes x 3 doses as needed for chest pain. 12/27/16   Orpah Cobb, MD  olmesartan (BENICAR) 40 MG tablet Take 40 mg by mouth daily. 04/16/17   [provider]  OZEMPIC 0.25 or 0.5 MG/DOSE SOPN INJECT 0.5MG  INTO SKIN EVERY WEEK, IN THE ABDOMEN, THIGHS, OR UPPER ARM ROTATING INJECTION SITES 08/15/17   [provider]  potassium chloride (K-DUR) 10 MEQ tablet Take 10 mEq by mouth 2 (two) times daily. 10/30/17   [provider]  potassium chloride SA (K-DUR,KLOR-CON) 20 MEQ tablet Take 1 tablet (20 mEq total) by mouth 2 (two) times daily. 06/13/17   Orpah Cobb, MD  psyllium (HYDROCIL/METAMUCIL) 95 % PACK Take 1 packet by mouth as needed for mild constipation.    [provider]  travoprost, benzalkonium, (TRAVATAN) 0.004 % ophthalmic solution Place 1 drop into both eyes at bedtime.     [provider]    Family History Family History  Problem Relation Age of Onset  . Alzheimer's disease Mother   . Aneurysm Father   . Cancer Brother     Social History Social History   Tobacco Use  . Smoking status: Never Smoker  . Smokeless tobacco: Never Used  Substance Use Topics  . Alcohol use: No    Alcohol/week: 0.0 standard drinks  . Drug use: No     Allergies   Atorvastatin; Metoprolol tartrate; Pregabalin; and Rosuvastatin calcium   Review of Systems Review of Systems  Constitutional: Negative for fever.  HENT: Negative for sore throat.   Eyes: Negative for redness.  Respiratory: Negative for shortness of breath.   Cardiovascular: Negative for chest pain and leg swelling.  Gastrointestinal: Negative for abdominal pain.  Genitourinary: Negative for flank pain.  Musculoskeletal: Negative for back pain.  Skin: Negative for rash.  Neurological: Negative for weakness and numbness.  Hematological: Does not bruise/bleed easily.  Psychiatric/Behavioral:  Negative for confusion.     Physical Exam Updated Vital Signs BP (!) 174/75   Pulse (!) 59   Temp 98.5 F (36.9 C) (Oral)   Resp 17   Ht 1.588 m (5' 2.5")   Wt 75.3 kg   SpO2 99%   BMI 29.88 kg/m   Physical Exam  Constitutional: She appears well-developed and well-nourished.  HENT:  Head: Atraumatic.  Eyes: Conjunctivae are normal. No scleral icterus.  Neck: Neck supple. No tracheal deviation present.  Cardiovascular: Normal rate.  Pulmonary/Chest: Effort normal. No respiratory distress.  Abdominal: Normal appearance. She exhibits no distension.  Genitourinary:  Genitourinary Comments: No cva tenderness.   Musculoskeletal: She exhibits no edema.  L/S spine non tender, aligned.  Good passive rom right hip and knee without pain - no findings c/w septic joint. Tenderness right knee anteriorly. Right knee effusion. Dp/pt 2+. No leg swelling. No skin changes, no erythema or increased warmth to knee, no lesions to area of pain.   Neurological: She is alert.  Skin: Skin is warm and dry. No rash noted.  Psychiatric: She has a normal mood and affect.  Nursing note and vitals reviewed.    ED Treatments / Results  Labs (all labs ordered are listed, but only abnormal results are displayed) Labs Reviewed - No data to display  EKG None  Radiology Dg Knee Complete 4 Views Right  Result Date: 12/24/2017 CLINICAL DATA:  Diffuse knee pain since last night, no injury EXAM: RIGHT KNEE - COMPLETE 4+ VIEW COMPARISON:  None FINDINGS: Osseous demineralization. Joint spaces preserved. Articular surface irregularity at patella. Quadriceps tendon insertion spur at cranial margin of patella. No acute fracture, dislocation or bone destruction. Moderate-sized knee joint effusion. IMPRESSION: Mild degenerative changes RIGHT knee with moderate-sized knee joint effusion. Electronically Signed   By: Ulyses Southward M.D.   On: 12/24/2017 08:38    Procedures Procedures (including critical care  time)  Medications Ordered in ED Medications  traMADol (ULTRAM) tablet 50 mg (has no administration in time range)  ibuprofen (ADVIL,MOTRIN) tablet 400 mg (has no administration in time range)     Initial Impression / Assessment and Plan / ED Course  I have reviewed the triage vital signs and the nursing notes.  Pertinent labs & imaging results that were available during my care of the patient were reviewed by me and considered in my medical decision making (see chart for details).  Xrays.   Pt has not taken anything for pain today, has ride/does not have to drive.   Ultram po, motrin po.  Reviewed nursing notes and prior charts for additional history.   No fevers, warmth, pain w passive rom or other sign septic knee currently.   rx for home. Ortho f/u. Return precautions provided. Spouse sees Dr Sherlean Foot, and pt would like to f/u there. Will provide information re f/u there.     Final Clinical Impressions(s) / ED Diagnoses   Final diagnoses:  None    ED Discharge Orders    None         Cathren Laine, MD 12/24/17 [phone]

## 2017-12-26 ENCOUNTER — Ambulatory Visit: Payer: Medicare Other | Admitting: Internal Medicine

## 2017-12-26 ENCOUNTER — Encounter: Payer: Self-pay | Admitting: Internal Medicine

## 2017-12-26 VITALS — BP 112/66 | HR 57 | Temp 98.3°F | Ht 62.5 in | Wt 170.0 lb

## 2017-12-26 DIAGNOSIS — S40021A Contusion of right upper arm, initial encounter: Secondary | ICD-10-CM | POA: Diagnosis not present

## 2017-12-26 DIAGNOSIS — I10 Essential (primary) hypertension: Secondary | ICD-10-CM

## 2017-12-26 DIAGNOSIS — M25461 Effusion, right knee: Secondary | ICD-10-CM | POA: Diagnosis not present

## 2017-12-26 DIAGNOSIS — E871 Hypo-osmolality and hyponatremia: Secondary | ICD-10-CM

## 2017-12-26 DIAGNOSIS — S7012XA Contusion of left thigh, initial encounter: Secondary | ICD-10-CM

## 2017-12-26 DIAGNOSIS — T45521A Poisoning by antithrombotic drugs, accidental (unintentional), initial encounter: Secondary | ICD-10-CM

## 2017-12-26 NOTE — Progress Notes (Signed)
Subjective:     Patient ID: [name] , female    DOB: 06/11/1945 , 72 y.o.   MRN: [id number]   HPI  Pt is here for ER visit on 9/15 due to bruising. She realized 9/2   She was taking her plavix wrong and been taking it tid instead of qd, and the Hydralazine ones a day instead of tid. She stopped taking the Plavix 1 week ago and since them they stopped and the old ones are healing.  2- she was in ER also 10/1 for R knee pain and high BP and has and had xray which shows mild degeneration and was sent to ortho. Has apt next week. Her labs showed mild hyponatremia and has been increasing her sodium intake.   Review of Systems  Constitution: Negative for chills, fever and night sweats.  HENT: Negative for tinnitus.   Cardiovascular: Negative for chest pain, dyspnea on exertion and palpitations.  Hematologic/Lymphatic: Negative for adenopathy. Bruises/bleeds easily.       See HPI  Gastrointestinal: Negative for nausea.  Neurological: Negative for dizziness and headaches.   Muscular- has effusion of R knee and suprapatellar region. Not hot or red.   Past Medical History:  Diagnosis Date  . Benign hypertensive renal disease   . CKD stage 2 due to type 2 diabetes mellitus (HCC)   . Hypertension   . Hypothyroidism   . Obesity   . OSA on CPAP   . Pure hypercholesterolemia   . PVD (peripheral vascular disease) (HCC)   . Vitamin D deficiency       Current Outpatient Medications:  .  albuterol (PROVENTIL HFA;VENTOLIN HFA) 108 (90 Base) MCG/ACT inhaler, Inhale 2 puffs into the lungs every 6 (six) hours as needed for wheezing or shortness of breath., Disp: 1 Inhaler, Rfl: 2 .  amLODipine (NORVASC) 5 MG tablet, Take 5 mg by mouth daily., Disp: , Rfl: 1 .  aspirin 81 MG tablet, Take 81 mg by mouth daily., Disp: , Rfl:  .  brimonidine (ALPHAGAN P) 0.1 % SOLN, Place 1 drop into both eyes every 8 (eight) hours., Disp: , Rfl:  .  calcium gluconate 500 MG tablet, Take 1 tablet by mouth 2 (two)  times daily. , Disp: , Rfl:  .  clopidogrel (PLAVIX) 75 MG tablet, Take 1 tablet (75 mg total) by mouth daily., Disp: 30 tablet, Rfl: 3 .  co-enzyme Q-10 30 MG capsule, Take 60 mg by mouth daily., Disp: , Rfl:  .  furosemide (LASIX) 20 MG tablet, Take 20 mg by mouth daily. , Disp: , Rfl:  .  hydrALAZINE (APRESOLINE) 10 MG tablet, Take 1 tablet (10 mg total) by mouth every 8 (eight) hours., Disp: 90 tablet, Rfl: 3 .  isosorbide mononitrate (IMDUR) 60 MG 24 hr tablet, Take 60 mg by mouth daily., Disp: , Rfl:  .  levothyroxine (SYNTHROID, LEVOTHROID) 125 MCG tablet, Take 125 mcg by mouth See admin instructions. 125 mcg by mouth daily on Mon thru Friday, Disp: , Rfl:  .  Lifitegrast (XIIDRA OP), Place 1 drop into both eyes 2 (two) times daily., Disp: , Rfl:  .  Multiple Vitamins-Minerals (MULTIVITAMIN WITH MINERALS) tablet, Take 1 tablet by mouth daily., Disp: , Rfl:  .  nitroGLYCERIN (NITROSTAT) 0.4 MG SL tablet, Place 1 tablet (0.4 mg total) under the tongue every 5 (five) minutes x 3 doses as needed for chest pain., Disp: 25 tablet, Rfl: 1 .  olmesartan (BENICAR) 40 MG tablet, Take 40 mg by  mouth daily., Disp: , Rfl: 1 .  OZEMPIC 0.25 or 0.5 MG/DOSE SOPN, INJECT 0.5MG  INTO SKIN EVERY WEEK, IN THE ABDOMEN, THIGHS, OR UPPER ARM ROTATING INJECTION SITES, Disp: , Rfl: 1 .  potassium chloride (K-DUR) 10 MEQ tablet, Take 10 mEq by mouth 2 (two) times daily., Disp: , Rfl: 3 .  traMADol (ULTRAM) 50 MG tablet, Take 1 tablet (50 mg total) by mouth every 6 (six) hours as needed., Disp: 15 tablet, Rfl: 0 .  travoprost, benzalkonium, (TRAVATAN) 0.004 % ophthalmic solution, Place 1 drop into both eyes at bedtime. , Disp: , Rfl:      Today's Vitals   12/26/17 1533  BP: 112/66  Pulse: (!) 57  Temp: 98.3 F (36.8 C)  TempSrc: Oral  SpO2: 96%  Weight: 170 lb (77.1 kg)  Height: 5' 2.5" (1.588 m)   Body mass index is 30.6 kg/m.   Objective:  Physical Exam  Constitutional: She is oriented to person,  place, and time.  HENT:  Head: Normocephalic.  Nose: Nose normal.  Eyes: Conjunctivae are normal.  Neck: Normal range of motion.  Cardiovascular: Normal rate, regular rhythm and intact distal pulses.  Pulmonary/Chest: Effort normal and breath sounds normal.  Musculoskeletal: She exhibits edema.  Has moderate effusion on R suprapatellar region. ROM is normal, but flexion > 90 provokes pain as well as complete extension  Neurological: She is alert and oriented to person, place, and time.  Skin: Skin is warm and dry.  Few bruises which are resolving seen on R inner arm and L inner thigh  Psychiatric: She has a normal mood and affect. Her behavior is normal. Judgment and thought content normal.      Assessment And Plan:     1- Bruising- resolving from incorrectly taking her medication.  2- R knee effusion- acute. Will see ortho next week.    I reviewed  Her 2 ER visit notes, Knee xray showed mild degeneration. 3-HTN- stable. CMP ordered.  4- Hyponatremia.    Nayelie Gionfriddo RODRIGUEZ-SOUTHWORTH, PA-C

## 2017-12-27 LAB — CMP14 + ANION GAP
ALK PHOS: 50 IU/L (ref 39–117)
ALT: 24 IU/L (ref 0–32)
AST: 46 IU/L — ABNORMAL HIGH (ref 0–40)
Albumin/Globulin Ratio: 2 (ref 1.2–2.2)
Albumin: 4.1 g/dL (ref 3.5–4.8)
Anion Gap: 13 mmol/L (ref 10.0–18.0)
BILIRUBIN TOTAL: 0.5 mg/dL (ref 0.0–1.2)
BUN/Creatinine Ratio: 12 (ref 12–28)
BUN: 12 mg/dL (ref 8–27)
CHLORIDE: 100 mmol/L (ref 96–106)
CO2: 24 mmol/L (ref 20–29)
Calcium: 10.1 mg/dL (ref 8.7–10.3)
Creatinine, Ser: 1 mg/dL (ref 0.57–1.00)
GFR calc Af Amer: 65 mL/min/{1.73_m2} (ref >59)
GFR calc non Af Amer: 56 mL/min/{1.73_m2} — ABNORMAL LOW (ref >59)
GLUCOSE: 96 mg/dL (ref 65–99)
Globulin, Total: 2.1 g/dL (ref 1.5–4.5)
POTASSIUM: 4.3 mmol/L (ref 3.5–5.2)
Sodium: 137 mmol/L (ref 134–144)
Total Protein: 6.2 g/dL (ref 6.0–8.5)

## 2017-12-30 ENCOUNTER — Other Ambulatory Visit: Payer: Self-pay | Admitting: Internal Medicine

## 2017-12-30 DIAGNOSIS — Z1231 Encounter for screening mammogram for malignant neoplasm of breast: Secondary | ICD-10-CM

## 2017-12-31 ENCOUNTER — Other Ambulatory Visit: Payer: Self-pay | Admitting: Cardiovascular Disease

## 2017-12-31 ENCOUNTER — Ambulatory Visit (HOSPITAL_COMMUNITY)
Admission: RE | Admit: 2017-12-31 | Discharge: 2017-12-31 | Disposition: A | Discharge status: Home / Self Care | Payer: Medicare Other | Source: Ambulatory Visit | Attending: Cardiovascular Disease | Admitting: Cardiovascular Disease

## 2017-12-31 DIAGNOSIS — M79604 Pain in right leg: Secondary | ICD-10-CM | POA: Insufficient documentation

## 2017-12-31 DIAGNOSIS — M7989 Other specified soft tissue disorders: Secondary | ICD-10-CM

## 2017-12-31 NOTE — Progress Notes (Signed)
RLE venous duplex prelim: negative for DVT. Area of mixed echoes noted in popliteal fossa extending into proximal calf, suggestive of ruptured baker's cyst. Farrel Demark, RDMS, RVT

## 2018-01-04 ENCOUNTER — Other Ambulatory Visit: Payer: Self-pay | Admitting: Internal Medicine

## 2018-01-27 ENCOUNTER — Ambulatory Visit
Admission: RE | Admit: 2018-01-27 | Discharge: 2018-01-27 | Disposition: A | Discharge status: Home / Self Care | Payer: Medicare Other | Source: Ambulatory Visit | Attending: Cardiovascular Disease | Admitting: Cardiovascular Disease

## 2018-01-27 ENCOUNTER — Other Ambulatory Visit: Payer: Self-pay | Admitting: Cardiovascular Disease

## 2018-01-27 DIAGNOSIS — R29898 Other symptoms and signs involving the musculoskeletal system: Secondary | ICD-10-CM

## 2018-02-03 ENCOUNTER — Ambulatory Visit: Payer: Medicare Other

## 2018-02-03 ENCOUNTER — Ambulatory Visit
Admission: RE | Admit: 2018-02-03 | Discharge: 2018-02-03 | Disposition: A | Discharge status: Home / Self Care | Payer: Medicare Other | Source: Ambulatory Visit | Attending: Internal Medicine | Admitting: Internal Medicine

## 2018-02-03 DIAGNOSIS — Z1231 Encounter for screening mammogram for malignant neoplasm of breast: Secondary | ICD-10-CM

## 2018-02-17 ENCOUNTER — Encounter: Payer: Self-pay | Admitting: Internal Medicine

## 2018-02-17 ENCOUNTER — Ambulatory Visit: Payer: Medicare Other | Admitting: Internal Medicine

## 2018-02-17 VITALS — BP 112/70 | HR 69 | Temp 98.0°F | Ht 62.5 in | Wt 162.6 lb

## 2018-02-17 DIAGNOSIS — E039 Hypothyroidism, unspecified: Secondary | ICD-10-CM

## 2018-02-17 DIAGNOSIS — I1310 Hypertensive heart and chronic kidney disease without heart failure, with stage 1 through stage 4 chronic kidney disease, or unspecified chronic kidney disease: Secondary | ICD-10-CM | POA: Diagnosis not present

## 2018-02-17 DIAGNOSIS — I2510 Atherosclerotic heart disease of native coronary artery without angina pectoris: Secondary | ICD-10-CM | POA: Diagnosis not present

## 2018-02-17 DIAGNOSIS — E1122 Type 2 diabetes mellitus with diabetic chronic kidney disease: Secondary | ICD-10-CM | POA: Diagnosis not present

## 2018-02-17 DIAGNOSIS — Z79899 Other long term (current) drug therapy: Secondary | ICD-10-CM

## 2018-02-17 DIAGNOSIS — N182 Chronic kidney disease, stage 2 (mild): Secondary | ICD-10-CM | POA: Diagnosis not present

## 2018-02-17 DIAGNOSIS — Z7982 Long term (current) use of aspirin: Secondary | ICD-10-CM

## 2018-02-17 DIAGNOSIS — M1711 Unilateral primary osteoarthritis, right knee: Secondary | ICD-10-CM

## 2018-02-17 NOTE — Progress Notes (Signed)
Subjective:     Patient ID: [name] , female    DOB: 1945-04-13 , 72 y.o.   MRN: [id number]   Chief Complaint  Patient presents with  . Diabetes  . Hypertension    HPI  She has been taking Ozempic for her diabetes and states she feels great while on the medication. She is pleased with her progress and happy to be off of insulin.   Diabetes  She presents for her follow-up diabetic visit. She has type 2 diabetes mellitus. There are no hypoglycemic associated symptoms. There are no diabetic associated symptoms. There are no hypoglycemic complications. Diabetic complications include nephropathy. Risk factors for coronary artery disease include diabetes mellitus, dyslipidemia, hypertension and post-menopausal. Her weight is decreasing steadily. She is following a diabetic diet. She participates in exercise intermittently. Her home blood glucose trend is fluctuating minimally. Her breakfast blood glucose range is generally 110-130 mg/dl. Eye exam is current.  Hypertension  This is a chronic problem. The current episode started more than 1 year ago. The problem has been gradually improving since onset. The problem is controlled. Risk factors for coronary artery disease include diabetes mellitus.  She reports compliance with meds.    Past Medical History:  Diagnosis Date  . Benign hypertensive renal disease   . CKD stage 2 due to type 2 diabetes mellitus (HCC)   . Hypertension   . Hypothyroidism   . Obesity   . OSA on CPAP   . Pure hypercholesterolemia   . PVD (peripheral vascular disease) (HCC)   . Vitamin D deficiency      Family History  Problem Relation Age of Onset  . Alzheimer's disease Mother   . Aneurysm Father   . Cancer Brother      Current Outpatient Medications:  .  albuterol (PROVENTIL HFA;VENTOLIN HFA) 108 (90 Base) MCG/ACT inhaler, Inhale 2 puffs into the lungs every 6 (six) hours as needed for wheezing or shortness of breath., Disp: 1 Inhaler, Rfl: 2 .   amLODipine (NORVASC) 5 MG tablet, TAKE 1 TABLET BY MOUTH ONCE DAILY, Disp: 90 tablet, Rfl: 1 .  aspirin 81 MG tablet, Take 81 mg by mouth daily., Disp: , Rfl:  .  brimonidine (ALPHAGAN P) 0.1 % SOLN, Place 1 drop into both eyes every 8 (eight) hours., Disp: , Rfl:  .  calcium gluconate 500 MG tablet, Take 1 tablet by mouth 2 (two) times daily. , Disp: , Rfl:  .  cilostazol (PLETAL) 100 MG tablet, Take 100 mg by mouth 2 (two) times daily., Disp: , Rfl: 3 .  co-enzyme Q-10 30 MG capsule, Take 60 mg by mouth daily., Disp: , Rfl:  .  furosemide (LASIX) 20 MG tablet, Take 20 mg by mouth daily. , Disp: , Rfl:  .  hydrALAZINE (APRESOLINE) 10 MG tablet, Take 1 tablet (10 mg total) by mouth every 8 (eight) hours., Disp: 90 tablet, Rfl: 3 .  isosorbide mononitrate (IMDUR) 60 MG 24 hr tablet, Take 60 mg by mouth daily., Disp: , Rfl:  .  levothyroxine (SYNTHROID, LEVOTHROID) 125 MCG tablet, Take 125 mcg by mouth See admin instructions. 125 mcg by mouth daily on Mon thru Friday, Disp: , Rfl:  .  Multiple Vitamins-Minerals (MULTIVITAMIN WITH MINERALS) tablet, Take 1 tablet by mouth daily., Disp: , Rfl:  .  nitroGLYCERIN (NITROSTAT) 0.4 MG SL tablet, Place 1 tablet (0.4 mg total) under the tongue every 5 (five) minutes x 3 doses as needed for chest pain., Disp: 25 tablet, Rfl: 1 .  olmesartan (BENICAR) 40 MG tablet, Take 40 mg by mouth daily., Disp: , Rfl: 1 .  OZEMPIC 0.25 or 0.5 MG/DOSE SOPN, INJECT 0.5MG  INTO SKIN EVERY WEEK, IN THE ABDOMEN, THIGHS, OR UPPER ARM ROTATING INJECTION SITES, Disp: , Rfl: 1 .  potassium chloride (K-DUR) 10 MEQ tablet, Take 10 mEq by mouth 2 (two) times daily., Disp: , Rfl: 3 .  travoprost, benzalkonium, (TRAVATAN) 0.004 % ophthalmic solution, Place 1 drop into both eyes at bedtime. , Disp: , Rfl:    Allergies  Allergen Reactions  . Atorvastatin Other (See Comments) and Swelling    unknown  . Clopidogrel   . Metoprolol Tartrate Swelling  . Pregabalin Swelling  . Rosuvastatin  Calcium Other (See Comments)    unknown     Review of Systems  Constitutional: Negative.   Respiratory: Negative.   Cardiovascular: Negative.   Gastrointestinal: Negative.   Musculoskeletal: Positive for arthralgias (c/o r knee pain. there is some pain with ambulation. uses cane now).  Neurological: Negative.   Psychiatric/Behavioral: Negative.      Today's Vitals   02/17/18 0900  BP: 112/70  Pulse: 69  Temp: 98 F (36.7 C)  TempSrc: Oral  SpO2: 98%  Weight: 162 lb 9.6 oz (73.8 kg)  Height: 5' 2.5" (1.588 m)   Body mass index is 29.27 kg/m.   Objective:  Physical Exam  Constitutional: She is oriented to person, place, and time. She appears well-developed and well-nourished.  HENT:  Head: Normocephalic and atraumatic.  Cardiovascular: Normal rate, regular rhythm and normal heart sounds.  Pulmonary/Chest: Effort normal and breath sounds normal.  Musculoskeletal:  There is tenderness to palpation of patella, some swelling. Pain w/ flexion  Neurological: She is alert and oriented to person, place, and time.  Psychiatric: She has a normal mood and affect.  Nursing note and vitals reviewed.       Assessment And Plan:     1. Type 2 diabetes mellitus with stage 2 chronic kidney disease, without long-term current use of insulin (HCC)  I will check an a1c today, along with her kidney function. She was congratulated on her progress thus far.   - Hemoglobin A1c  2. Chronic renal disease, stage II  Chronic, yet stable.   3. Benign hypertensive heart and renal disease  Well controlled. She will continue with current meds. She is encouraged to avoid adding salt to her foods. Pt advised that we will likely have to decrease her medication as she nears her goal weight. She is encouraged to let me know if she develops dizziness.   4. CAD in native artery  Chronic, yet stable. Importance of medication compliance was discussed with the patient.   5. Primary  hypothyroidism  I will check a thyroid panel and adjust meds as needed.  - TSH - T4, Free  6. Localized osteoarthritis of right knee  Currently ambulatory with a cane. As per orthopedics, Dr. Sherlean FootLucey. I will request notes from her most recent office visit.   7. Drug therapy   Gwynneth Alimentobyn N Karlis Cregg, MD

## 2018-02-17 NOTE — Patient Instructions (Signed)
Hypothyroidism Hypothyroidism is a disorder of the thyroid. The thyroid is a large gland that is located in the lower front of the neck. The thyroid releases hormones that control how the body works. With hypothyroidism, the thyroid does not make enough of these hormones. What are the causes? Causes of hypothyroidism may include:  Viral infections.  Pregnancy.  Your own defense system (immune system) attacking your thyroid.  Certain medicines.  Birth defects.  Past radiation treatments to your head or neck.  Past treatment with radioactive iodine.  Past surgical removal of part or all of your thyroid.  Problems with the gland that is located in the center of your brain (pituitary).  What are the signs or symptoms? Signs and symptoms of hypothyroidism may include:  Feeling as though you have no energy (lethargy).  Inability to tolerate cold.  Weight gain that is not explained by a change in diet or exercise habits.  Dry skin.  Coarse hair.  Menstrual irregularity.  Slowing of thought processes.  Constipation.  Sadness or depression.  How is this diagnosed? Your health care provider may diagnose hypothyroidism with blood tests and ultrasound tests. How is this treated? Hypothyroidism is treated with medicine that replaces the hormones that your body does not make. After you begin treatment, it may take several weeks for symptoms to go away. Follow these instructions at home:  Take medicines only as directed by your health care provider.  If you start taking any new medicines, tell your health care provider.  Keep all follow-up visits as directed by your health care provider. This is important. As your condition improves, your dosage needs may change. You will need to have blood tests regularly so that your health care provider can watch your condition. Contact a health care provider if:  Your symptoms do not get better with treatment.  You are taking thyroid  replacement medicine and: ? You sweat excessively. ? You have tremors. ? You feel anxious. ? You lose weight rapidly. ? You cannot tolerate heat. ? You have emotional swings. ? You have diarrhea. ? You feel weak. Get help right away if:  You develop chest pain.  You develop an irregular heartbeat.  You develop a rapid heartbeat. This information is not intended to replace advice given to you by your health care provider. Make sure you discuss any questions you have with your health care provider. Document Released: 03/12/2005 Document Revised: 08/18/2015 Document Reviewed: 07/28/2013 Elsevier Interactive Patient Education  2018 [employer]  

## 2018-02-18 LAB — HEMOGLOBIN A1C
ESTIMATED AVERAGE GLUCOSE: 114 mg/dL
HEMOGLOBIN A1C: 5.6 % (ref 4.8–5.6)

## 2018-02-18 LAB — TSH: TSH: 1.22 u[IU]/mL (ref 0.450–4.500)

## 2018-02-18 LAB — T4, FREE: FREE T4: 1.46 ng/dL (ref 0.82–1.77)

## 2018-02-18 NOTE — Progress Notes (Signed)
Here are your lab results:  Your thyroid function is normal. Your a1c is 5.6, this is great! We do not need to check this again for four months!  You are doing wonderful! Keep up the great work!  Happy Thanksgiving to you and your family!  Sincerely,    Eiliana Drone N. Allyne GeeSanders, MD

## 2018-02-24 ENCOUNTER — Telehealth: Payer: Self-pay

## 2018-02-24 NOTE — Telephone Encounter (Signed)
Left the pt a message to call back so that I can let her know that Dr. Allyne GeeSanders said she thinks that Aspercream would better results and that the pt can get this over the counter. If not Dr. Allyne GeeSanders said she can call in a topical compound pain cream

## 2018-02-25 ENCOUNTER — Telehealth: Payer: Self-pay

## 2018-02-25 NOTE — Telephone Encounter (Signed)
patient notified that Dr. Allyne GeeSanders said she thinks that Aspercream would better results and that the pt can get this over the counter. If not Dr. Allyne GeeSanders said she can call in a topical compound pain cream.

## 2018-03-14 ENCOUNTER — Encounter: Payer: Self-pay | Admitting: Internal Medicine

## 2018-04-04 ENCOUNTER — Other Ambulatory Visit: Payer: Self-pay

## 2018-04-04 MED ORDER — ISOSORBIDE MONONITRATE ER 60 MG PO TB24: 60.0000 mg | ORAL_TABLET | Freq: Every day | ORAL | 1 refills | Status: DC

## 2018-04-04 MED ORDER — OLMESARTAN MEDOXOMIL 40 MG PO TABS: 40.0000 mg | ORAL_TABLET | Freq: Every day | ORAL | 1 refills | Status: DC

## 2018-04-04 MED ORDER — LEVOTHYROXINE SODIUM 125 MCG PO TABS: 125.0000 ug | ORAL_TABLET | ORAL | 1 refills | Status: DC

## 2018-04-04 MED ORDER — FUROSEMIDE 20 MG PO TABS: 20.0000 mg | ORAL_TABLET | Freq: Every day | ORAL | 1 refills | Status: DC

## 2018-04-16 ENCOUNTER — Encounter: Payer: Self-pay | Admitting: Nurse Practitioner

## 2018-05-08 ENCOUNTER — Other Ambulatory Visit: Payer: Self-pay | Admitting: Cardiovascular Disease

## 2018-05-08 DIAGNOSIS — M519 Unspecified thoracic, thoracolumbar and lumbosacral intervertebral disc disorder: Secondary | ICD-10-CM

## 2018-05-21 ENCOUNTER — Encounter: Payer: Self-pay | Admitting: Internal Medicine

## 2018-05-21 ENCOUNTER — Ambulatory Visit (INDEPENDENT_AMBULATORY_CARE_PROVIDER_SITE_OTHER): Payer: Medicare PPO | Admitting: Internal Medicine

## 2018-05-21 ENCOUNTER — Ambulatory Visit: Payer: Medicare PPO

## 2018-05-21 VITALS — BP 130/80 | HR 51 | Temp 97.6°F | Ht 62.2 in | Wt 155.4 lb

## 2018-05-21 DIAGNOSIS — F329 Major depressive disorder, single episode, unspecified: Secondary | ICD-10-CM

## 2018-05-21 DIAGNOSIS — N182 Chronic kidney disease, stage 2 (mild): Secondary | ICD-10-CM | POA: Diagnosis not present

## 2018-05-21 DIAGNOSIS — G4709 Other insomnia: Secondary | ICD-10-CM

## 2018-05-21 DIAGNOSIS — Z0000 Encounter for general adult medical examination without abnormal findings: Secondary | ICD-10-CM | POA: Diagnosis not present

## 2018-05-21 DIAGNOSIS — E1122 Type 2 diabetes mellitus with diabetic chronic kidney disease: Secondary | ICD-10-CM | POA: Diagnosis not present

## 2018-05-21 LAB — POCT URINALYSIS DIPSTICK
Bilirubin, UA: NEGATIVE
Blood, UA: NEGATIVE
Glucose, UA: NEGATIVE
Ketones, UA: NEGATIVE
Leukocytes, UA: NEGATIVE
Nitrite, UA: NEGATIVE
Protein, UA: NEGATIVE
Spec Grav, UA: >=1.03 — AB
Urobilinogen, UA: 0.2 U/dL
pH, UA: 6

## 2018-05-21 LAB — POCT UA - MICROALBUMIN
Albumin/Creatinine Ratio, Urine, POC: <30
Creatinine, POC: 300 mg/dL
Microalbumin Ur, POC: 30 mg/L

## 2018-05-21 MED ORDER — TEMAZEPAM 15 MG PO CAPS: 15.0000 mg | ORAL_CAPSULE | Freq: Every evening | ORAL | 0 refills | Status: DC | PRN

## 2018-05-21 NOTE — Patient Instructions (Signed)
[name] , Thank you for taking time to come for your Medicare Wellness Visit. I appreciate your ongoing commitment to your health goals. Please review the following plan we discussed and let me know if I can assist you in the future.   Screening recommendations/referrals: Colonoscopy: 11/2012 Mammogram: 01/2018 Bone Density: 07/2014 Recommended yearly ophthalmology/optometry visit for glaucoma screening and checkup Recommended yearly dental visit for hygiene and checkup  Vaccinations: Influenza vaccine: 11/2017 Pneumococcal vaccine: 12/2016 Tdap vaccine: 10/2012 Shingles vaccine: 01/2013  Advanced directives: Advance directive discussed with you today. I have provided a copy for you to complete at home and have notarized. Once this is complete please bring a copy in to our office so we can scan it into your chart.   Conditions/risks identified: Depression   Next appointment: 05/21/2018   Preventive Care 65 Years and Older, Female Preventive care refers to lifestyle choices and visits with your health care provider that can promote health and wellness. What does preventive care include?  A yearly physical exam. This is also called an annual well check.  Dental exams once or twice a year.  Routine eye exams. Ask your health care provider how often you should have your eyes checked.  Personal lifestyle choices, including:  Daily care of your teeth and gums.  Regular physical activity.  Eating a healthy diet.  Avoiding tobacco and drug use.  Limiting alcohol use.  Practicing safe sex.  Taking low-dose aspirin every day.  Taking vitamin and mineral supplements as recommended by your health care provider. What happens during an annual well check? The services and screenings done by your health care provider during your annual well check will depend on your age, overall health, lifestyle risk factors, and family history of disease. Counseling  Your health care provider  may ask you questions about your:  Alcohol use.  Tobacco use.  Drug use.  Emotional well-being.  Home and relationship well-being.  Sexual activity.  Eating habits.  History of falls.  Memory and ability to understand (cognition).  Work and work Astronomer.  Reproductive health. Screening  You may have the following tests or measurements:  Height, weight, and BMI.  Blood pressure.  Lipid and cholesterol levels. These may be checked every 5 years, or more frequently if you are over 100 years old.  Skin check.  Lung cancer screening. You may have this screening every year starting at age 66 if you have a 30-pack-year history of smoking and currently smoke or have quit within the past 15 years.  Fecal occult blood test (FOBT) of the stool. You may have this test every year starting at age 32.  Flexible sigmoidoscopy or colonoscopy. You may have a sigmoidoscopy every 5 years or a colonoscopy every 10 years starting at age 28.  Hepatitis C blood test.  Hepatitis B blood test.  Sexually transmitted disease (STD) testing.  Diabetes screening. This is done by checking your blood sugar (glucose) after you have not eaten for a while (fasting). You may have this done every 1-3 years.  Bone density scan. This is done to screen for osteoporosis. You may have this done starting at age 40.  Mammogram. This may be done every 1-2 years. Talk to your health care provider about how often you should have regular mammograms. Talk with your health care provider about your test results, treatment options, and if necessary, the need for more tests. Vaccines  Your health care provider may recommend certain vaccines, such as:  Influenza vaccine. This  is recommended every year.  Tetanus, diphtheria, and acellular pertussis (Tdap, Td) vaccine. You may need a Td booster every 10 years.  Zoster vaccine. You may need this after age 96.  Pneumococcal 13-valent conjugate (PCV13) vaccine.  One dose is recommended after age 47.  Pneumococcal polysaccharide (PPSV23) vaccine. One dose is recommended after age 4. Talk to your health care provider about which screenings and vaccines you need and how often you need them. This information is not intended to replace advice given to you by your health care provider. Make sure you discuss any questions you have with your health care provider. Document Released: 04/08/2015 Document Revised: 11/30/2015 Document Reviewed: 01/11/2015 Elsevier Interactive Patient Education  2017 [place] Prevention in the Home Falls can cause injuries. They can happen to people of all ages. There are many things you can do to make your home safe and to help prevent falls. What can I do on the outside of my home?  Regularly fix the edges of walkways and driveways and fix any cracks.  Remove anything that might make you trip as you walk through a door, such as a raised step or threshold.  Trim any bushes or trees on the path to your home.  Use bright outdoor lighting.  Clear any walking paths of anything that might make someone trip, such as rocks or tools.  Regularly check to see if handrails are loose or broken. Make sure that both sides of any steps have handrails.  Any raised decks and porches should have guardrails on the edges.  Have any leaves, snow, or ice cleared regularly.  Use sand or salt on walking paths during winter.  Clean up any spills in your garage right away. This includes oil or grease spills. What can I do in the bathroom?  Use night lights.  Install grab bars by the toilet and in the tub and shower. Do not use towel bars as grab bars.  Use non-skid mats or decals in the tub or shower.  If you need to sit down in the shower, use a plastic, non-slip stool.  Keep the floor dry. Clean up any water that spills on the floor as soon as it happens.  Remove soap buildup in the tub or shower regularly.  Attach bath  mats securely with double-sided non-slip rug tape.  Do not have throw rugs and other things on the floor that can make you trip. What can I do in the bedroom?  Use night lights.  Make sure that you have a light by your bed that is easy to reach.  Do not use any sheets or blankets that are too big for your bed. They should not hang down onto the floor.  Have a firm chair that has side arms. You can use this for support while you get dressed.  Do not have throw rugs and other things on the floor that can make you trip. What can I do in the kitchen?  Clean up any spills right away.  Avoid walking on wet floors.  Keep items that you use a lot in easy-to-reach places.  If you need to reach something above you, use a strong step stool that has a grab bar.  Keep electrical cords out of the way.  Do not use floor polish or wax that makes floors slippery. If you must use wax, use non-skid floor wax.  Do not have throw rugs and other things on the floor that can make you  trip. What can I do with my stairs?  Do not leave any items on the stairs.  Make sure that there are handrails on both sides of the stairs and use them. Fix handrails that are broken or loose. Make sure that handrails are as long as the stairways.  Check any carpeting to make sure that it is firmly attached to the stairs. Fix any carpet that is loose or worn.  Avoid having throw rugs at the top or bottom of the stairs. If you do have throw rugs, attach them to the floor with carpet tape.  Make sure that you have a light switch at the top of the stairs and the bottom of the stairs. If you do not have them, ask someone to add them for you. What else can I do to help prevent falls?  Wear shoes that:  Do not have high heels.  Have rubber bottoms.  Are comfortable and fit you well.  Are closed at the toe. Do not wear sandals.  If you use a stepladder:  Make sure that it is fully opened. Do not climb a closed  stepladder.  Make sure that both sides of the stepladder are locked into place.  Ask someone to hold it for you, if possible.  Clearly mark and make sure that you can see:  Any grab bars or handrails.  First and last steps.  Where the edge of each step is.  Use tools that help you move around (mobility aids) if they are needed. These include:  Canes.  Walkers.  Scooters.  Crutches.  Turn on the lights when you go into a dark area. Replace any light bulbs as soon as they burn out.  Set up your furniture so you have a clear path. Avoid moving your furniture around.  If any of your floors are uneven, fix them.  If there are any pets around you, be aware of where they are.  Review your medicines with your doctor. Some medicines can make you feel dizzy. This can increase your chance of falling. Ask your doctor what other things that you can do to help prevent falls. This information is not intended to replace advice given to you by your health care provider. Make sure you discuss any questions you have with your health care provider. Document Released: 01/06/2009 Document Revised: 08/18/2015 Document Reviewed: 04/16/2014 Elsevier Interactive Patient Education  2017 [employer].

## 2018-05-21 NOTE — Progress Notes (Signed)
Subjective:   Blaike Newburn is a 73 y.o. female who presents for Medicare Annual (Subsequent) preventive examination.  Review of Systems:  n/a Cardiac Risk Factors include: advanced age (>37men, >72 women);sedentary lifestyle;hypertension     Objective:     Vitals: BP 130/80 (BP Location: Right Arm, Patient Position: Sitting)   Pulse (!) 51   Temp 97.6 F (36.4 C) (Oral)   Ht 5' 2.2" (1.58 m)   Wt 155 lb 6.4 oz (70.5 kg)   SpO2 98%   BMI 28.24 kg/m   Body mass index is 28.24 kg/m.  Advanced Directives 05/21/2018 12/24/2017 12/15/2017 06/12/2017 12/26/2016 07/18/2015  Does Patient Have a Medical Advance Directive? No No No No No No  Would patient like information on creating a medical advance directive? Yes (MAU/Ambulatory/Procedural Areas - Information given) No - Patient declined No - Patient declined No - Patient declined No - Patient declined No - patient declined information    Tobacco Social History   Tobacco Use  Smoking Status Never Smoker  Smokeless Tobacco Never Used     Counseling given: Not Answered   Clinical Intake:  Pre-visit preparation completed: Yes  Pain : 0-10 Pain Score: 5  Pain Type: Chronic pain Pain Location: Knee Pain Orientation: Right Pain Radiating Towards: none Pain Descriptors / Indicators: Stabbing, Aching Pain Onset: More than a month ago Pain Frequency: Constant Pain Relieving Factors: none Effect of Pain on Daily Activities: slows down a little  Pain Relieving Factors: none  Nutritional Status: BMI 25 -29 Overweight Nutritional Risks: None Diabetes: Yes CBG done?: No Did pt. bring in CBG monitor from home?: No  How often do you need to have someone help you when you read instructions, pamphlets, or other written materials from your doctor or pharmacy?: 1 - Never  Interpreter Needed?: No  Information entered by :: 12 th grade  Past Medical History:  Diagnosis Date  . Benign hypertensive renal disease   . CKD  stage 2 due to type 2 diabetes mellitus (HCC)   . Hypertension   . Hypothyroidism   . Obesity   . OSA on CPAP   . Pure hypercholesterolemia   . PVD (peripheral vascular disease) (HCC)   . Vitamin D deficiency    Past Surgical History:  Procedure Laterality Date  . ABDOMINAL HYSTERECTOMY    . BREAST CYST EXCISION Right over 20 yrs  . broken leg and displaced ankle    . DILATION AND CURETTAGE OF UTERUS    . LEFT HEART CATH AND CORONARY ANGIOGRAPHY N/A 06/13/2017   Procedure: LEFT HEART CATH AND CORONARY ANGIOGRAPHY;  Surgeon: Orpah Cobb, MD;  Location: [facility];  Service: Cardiovascular;  Laterality: N/A;  . right and left rotator cuff    . THYROIDECTOMY     Family History  Problem Relation Age of Onset  . Alzheimer's disease Mother   . Aneurysm Father   . Cancer Brother    Social History   Socioeconomic History  . Marital status: Married    Spouse name: Not on file  . Number of children: Not on file  . Years of education: Not on file  . Highest education level: Not on file  Occupational History  . Occupation: retired  Engineer, production  . Financial resource strain: Not hard at all  . Food insecurity:    Worry: Never true    Inability: Never true  . Transportation needs:    Medical: No    Non-medical: No  Tobacco Use  .  Smoking status: Never Smoker  . Smokeless tobacco: Never Used  Substance and Sexual Activity  . Alcohol use: No    Alcohol/week: 0.0 standard drinks  . Drug use: No  . Sexual activity: Yes  Lifestyle  . Physical activity:    Days per week: 0 days    Minutes per session: 0 min  . Stress: Not at all  Relationships  . Social connections:    Talks on phone: Not on file    Gets together: Not on file    Attends religious service: Not on file    Active member of club or organization: Not on file    Attends meetings of clubs or organizations: Not on file    Relationship status: Not on file  Other Topics Concern  . Not on file  Social  History Narrative   Drinks 1-2 cups daily of coffee daily.    Outpatient Encounter Medications as of 05/21/2018  Medication Sig  . albuterol (PROVENTIL HFA;VENTOLIN HFA) 108 (90 Base) MCG/ACT inhaler Inhale 2 puffs into the lungs every 6 (six) hours as needed for wheezing or shortness of breath.  [name] amLODipine (NORVASC) 5 MG tablet TAKE 1 TABLET BY MOUTH ONCE DAILY  . aspirin 81 MG tablet Take 81 mg by mouth daily.  . brimonidine (ALPHAGAN P) 0.1 % SOLN Place 1 drop into both eyes every 8 (eight) hours.  . calcium gluconate 500 MG tablet Take 1 tablet by mouth 2 (two) times daily.   . cilostazol (PLETAL) 100 MG tablet Take 100 mg by mouth 2 (two) times daily.  [name] co-enzyme Q-10 30 MG capsule Take 60 mg by mouth daily.  . furosemide (LASIX) 20 MG tablet Take 1 tablet (20 mg total) by mouth daily.  . hydrALAZINE (APRESOLINE) 10 MG tablet Take 1 tablet (10 mg total) by mouth every 8 (eight) hours.  . isosorbide mononitrate (IMDUR) 60 MG 24 hr tablet Take 1 tablet (60 mg total) by mouth daily.  [name] levothyroxine (SYNTHROID, LEVOTHROID) 125 MCG tablet Take 1 tablet (125 mcg total) by mouth See admin instructions. 125 mcg by mouth daily on Mon thru Friday  . Multiple Vitamins-Minerals (MULTIVITAMIN WITH MINERALS) tablet Take 1 tablet by mouth daily.  . nitroGLYCERIN (NITROSTAT) 0.4 MG SL tablet Place 1 tablet (0.4 mg total) under the tongue every 5 (five) minutes x 3 doses as needed for chest pain.  [name] olmesartan (BENICAR) 40 MG tablet Take 1 tablet (40 mg total) by mouth daily.  [name] OZEMPIC 0.25 or 0.5 MG/DOSE SOPN INJECT 0.5MG  INTO SKIN EVERY WEEK, IN THE ABDOMEN, THIGHS, OR UPPER ARM ROTATING INJECTION SITES  . potassium chloride (K-DUR) 10 MEQ tablet Take 10 mEq by mouth 2 (two) times daily.  . travoprost, benzalkonium, (TRAVATAN) 0.004 % ophthalmic solution Place 1 drop into both eyes at bedtime.    No facility-administered encounter medications on file as of 05/21/2018.     Activities of Daily  Living In your present state of health, do you have any difficulty performing the following activities: 05/21/2018 06/12/2017  Hearing? N -  Vision? N -  Difficulty concentrating or making decisions? Y -  Comment forgetfulness -  Walking or climbing stairs? Y -  Comment due to knee pain -  Dressing or bathing? N -  Doing errands, shopping? Y N  Comment husband runs errands and she goes to appoint ments alone -  Quarry manager and eating ? N -  Using the Toilet? N -  In the past six months, have  you accidently leaked urine? N -  Do you have problems with loss of bowel control? N -  Managing your Medications? N -  Managing your Finances? N -  Housekeeping or managing your Housekeeping? N -  Some recent data might be hidden    Patient Care Team: [name], MD as PCP - General (Internal Medicine) Chalmers Guest, MD as Consulting Physician (Ophthalmology)    Assessment:   This is a routine wellness examination for Tanielle.  Exercise Activities and Dietary recommendations Current Exercise Habits: The patient does not participate in regular exercise at present  Goals    . Patient Stated (pt-stated)     Wants to get off of medicines       Fall Risk Fall Risk  05/21/2018 02/17/2018 12/26/2017 10/21/2017  Falls in the past year? 1 0 No No  Number falls in past yr: 0 - - -  Comment slipped in bath tub - - -  Injury with Fall? 0 - - -  Risk for fall due to : Medication side effect - - -  Follow up Education provided;Falls prevention discussed - - -   Is the patient's home free of loose throw rugs in walkways, pet beds, electrical cords, etc?   yes      Grab bars in the bathroom? no      Handrails on the stairs?   no      Adequate lighting?   yes  Timed Get Up and Go performed: n/a  Depression Screen PHQ 2/9 Scores 05/21/2018 02/17/2018 12/26/2017  PHQ - 2 Score 2 0 0  PHQ- 9 Score 7 - -     Cognitive Function     6CIT Screen 05/21/2018  What Year? 0 points  What month?  0 points  What time? 3 points  Count back from 20 0 points  Months in reverse 0 points  Repeat phrase 0 points  Total Score 3    Immunization History  Administered Date(s) Administered  . Influenza, High Dose Seasonal PF 03/30/2014, 01/31/2015, 04/04/2016, 12/18/2016  . Influenza-Unspecified 01/24/2013, 12/21/2017  . Pneumococcal Polysaccharide-23 12/27/2016  . Zoster 02/03/2013    Qualifies for Shingles Vaccine? yes  Screening Tests Health Maintenance  Topic Date Due  . FOOT EXAM  02/20/1956  . OPHTHALMOLOGY EXAM  02/20/1956  . HEMOGLOBIN A1C  08/18/2018  . MAMMOGRAM  02/04/2020  . TETANUS/TDAP  11/11/2022  . COLONOSCOPY  11/27/2022  . INFLUENZA VACCINE  Completed  . DEXA SCAN  Completed  . Hepatitis C Screening  Completed  . PNA vac Low Risk Adult  Completed    Cancer Screenings: Lung: Low Dose CT Chest recommended if Age 4-80 years, 30 pack-year currently smoking OR have quit w/in 15years. Patient does not qualify. Breast:  Up to date on Mammogram? Yes   Up to date of Bone Density/Dexa? Yes Colorectal: up to date  Additional Screenings: : Hepatitis C Screening: 04/03/2012 <0.1     Plan:    Depression screen elevated. Appointment made for today to see PCP. Wants to get off medications.   I have personally reviewed and noted the following in the patient's chart:   . Medical and social history . Use of alcohol, tobacco or illicit drugs  . Current medications and supplements . Functional ability and status . Nutritional status . Physical activity . Advanced directives . List of other physicians . Hospitalizations, surgeries, and ER visits in previous 12 months . Vitals . Screenings to include cognitive, depression, and falls . Referrals  and appointments  In addition, I have reviewed and discussed with patient certain preventive protocols, quality metrics, and best practice recommendations. A written personalized care plan for preventive services as well as  general preventive health recommendations were provided to patient.     Barb Merino, LPN  10/18/2033

## 2018-05-23 ENCOUNTER — Telehealth: Payer: Self-pay

## 2018-05-23 ENCOUNTER — Ambulatory Visit
Admission: RE | Admit: 2018-05-23 | Discharge: 2018-05-23 | Disposition: A | Discharge status: Home / Self Care | Payer: Self-pay | Source: Ambulatory Visit | Attending: Cardiovascular Disease | Admitting: Cardiovascular Disease

## 2018-05-23 ENCOUNTER — Other Ambulatory Visit: Payer: Medicare Other

## 2018-05-23 ENCOUNTER — Other Ambulatory Visit: Payer: Self-pay | Admitting: Internal Medicine

## 2018-05-23 DIAGNOSIS — M519 Unspecified thoracic, thoracolumbar and lumbosacral intervertebral disc disorder: Secondary | ICD-10-CM

## 2018-05-23 NOTE — Telephone Encounter (Signed)
-----   Message from [name], MD sent at 05/23/2018  9:44 AM EST ----- Has she ever had temazepam before? (check [facility]) It is saying she is allergic.  ----- Message ----- From: Mariam Dollar, CMA Sent: 05/23/2018   8:28 AM EST To: [name], MD  The patient said to let Dr. Allyne Gee know that [facility] said that they didn't have her Temazepam.  I told the pt that it was sent and that I would let Dr Allyne Gee know so that it can be resent.

## 2018-05-23 NOTE — Telephone Encounter (Signed)
Left the pt a message to call back.  Dr. Allyne Gee wants to know if the pt has ever had temazepam before

## 2018-05-24 NOTE — Progress Notes (Signed)
Pt wants something for sleep. Temezepam presents with alert. Reached out to pt regarding her interaction w/ metoprolol. Awaiting her response.

## 2018-05-26 ENCOUNTER — Telehealth: Payer: Self-pay

## 2018-05-26 NOTE — Telephone Encounter (Signed)
Left the pt a message to call back.  Dr. Allyne Gee wants the pt to know that she hasn't called in a sleeping medication yet because the patient is allergic to metoprolol and Dr. Allyne Gee is waiting on the pharmacist to call back.

## 2018-06-06 ENCOUNTER — Ambulatory Visit: Payer: Medicare PPO | Admitting: Nurse Practitioner

## 2018-06-06 ENCOUNTER — Encounter: Payer: Self-pay | Admitting: Nurse Practitioner

## 2018-06-06 ENCOUNTER — Other Ambulatory Visit: Payer: Self-pay

## 2018-06-06 VITALS — BP 150/80 | HR 58 | Temp 98.7°F | Ht 62.0 in | Wt 155.4 lb

## 2018-06-06 DIAGNOSIS — J209 Acute bronchitis, unspecified: Secondary | ICD-10-CM

## 2018-06-06 DIAGNOSIS — I10 Essential (primary) hypertension: Secondary | ICD-10-CM | POA: Diagnosis not present

## 2018-06-06 MED ORDER — ALBUTEROL SULFATE HFA 108 (90 BASE) MCG/ACT IN AERS: 2.0000 | INHALATION_SPRAY | Freq: Four times a day (QID) | RESPIRATORY_TRACT | 2 refills | Status: DC | PRN

## 2018-06-06 MED ORDER — AMOXICILLIN 875 MG PO TABS: 875.0000 mg | ORAL_TABLET | Freq: Two times a day (BID) | ORAL | 0 refills | Status: DC

## 2018-06-06 MED ORDER — TRIAMCINOLONE ACETONIDE 40 MG/ML IJ SUSP
40.0000 mg | Freq: Once | INTRAMUSCULAR | Status: AC
  Administered 2018-06-06: 40 mg via INTRAMUSCULAR

## 2018-06-06 MED ORDER — HYDROCODONE-HOMATROPINE 5-1.5 MG/5ML PO SYRP: 5.0000 mL | ORAL_SOLUTION | Freq: Four times a day (QID) | ORAL | 0 refills | Status: DC | PRN

## 2018-06-06 NOTE — Progress Notes (Signed)
Subjective:     Patient ID: [name] , female    DOB: [birth year] , 73 y.o.   MRN: [id number]   Chief Complaint  Patient presents with  . Cough    couging , shortness of breath , no fever     HPI  Cough  This is a new problem. The current episode started 1 to 4 weeks ago (2 weeks). The problem has been gradually worsening. The problem occurs constantly. The cough is non-productive. Associated symptoms include shortness of breath (when having coughing episode). Pertinent negatives include no chest pain, chills, fever, headaches or wheezing (last night). She has tried nothing for the symptoms. The treatment provided no relief. Her past medical history is significant for pneumonia (she has had pneumonia at least 3 times). There is no history of asthma.     Past Medical History:  Diagnosis Date  . Benign hypertensive renal disease   . CKD stage 2 due to type 2 diabetes mellitus (HCC)   . Hypertension   . Hypothyroidism   . Obesity   . OSA on CPAP   . Pure hypercholesterolemia   . PVD (peripheral vascular disease) (HCC)   . Vitamin D deficiency      Family History  Problem Relation Age of Onset  . Alzheimer's disease Mother   . Aneurysm Father   . Cancer Brother      Current Outpatient Medications:  .  albuterol (PROVENTIL HFA;VENTOLIN HFA) 108 (90 Base) MCG/ACT inhaler, Inhale 2 puffs into the lungs every 6 (six) hours as needed for wheezing or shortness of breath., Disp: 1 Inhaler, Rfl: 2 .  aspirin 81 MG tablet, Take 81 mg by mouth daily., Disp: , Rfl:  .  calcium gluconate 500 MG tablet, Take 1 tablet by mouth 2 (two) times daily. , Disp: , Rfl:  .  furosemide (LASIX) 20 MG tablet, Take 1 tablet (20 mg total) by mouth daily., Disp: 90 tablet, Rfl: 1 .  hydrALAZINE (APRESOLINE) 10 MG tablet, Take 1 tablet (10 mg total) by mouth every 8 (eight) hours., Disp: 90 tablet, Rfl: 3 .  isosorbide mononitrate (IMDUR) 60 MG 24 hr tablet, Take 1 tablet (60 mg total) by mouth  daily., Disp: 180 tablet, Rfl: 1 .  levothyroxine (SYNTHROID, LEVOTHROID) 125 MCG tablet, Take 1 tablet (125 mcg total) by mouth See admin instructions. 125 mcg by mouth daily on Mon thru Friday, Disp: 90 tablet, Rfl: 1 .  Multiple Vitamins-Minerals (MULTIVITAMIN WITH MINERALS) tablet, Take 1 tablet by mouth daily., Disp: , Rfl:  .  nitroGLYCERIN (NITROSTAT) 0.4 MG SL tablet, Place 1 tablet (0.4 mg total) under the tongue every 5 (five) minutes x 3 doses as needed for chest pain., Disp: 25 tablet, Rfl: 1 .  olmesartan (BENICAR) 40 MG tablet, Take 1 tablet (40 mg total) by mouth daily., Disp: 90 tablet, Rfl: 1 .  OZEMPIC 0.25 or 0.5 MG/DOSE SOPN, INJECT 0.5MG  INTO SKIN EVERY WEEK, IN THE ABDOMEN, THIGHS, OR UPPER ARM ROTATING INJECTION SITES, Disp: , Rfl: 1 .  potassium chloride (K-DUR) 10 MEQ tablet, Take 10 mEq by mouth 2 (two) times daily., Disp: , Rfl: 3 .  temazepam (RESTORIL) 15 MG capsule, Take 1 capsule (15 mg total) by mouth at bedtime as needed for sleep., Disp: 30 capsule, Rfl: 0 .  brimonidine (ALPHAGAN P) 0.1 % SOLN, Place 1 drop into both eyes every 8 (eight) hours., Disp: , Rfl:  .  cilostazol (PLETAL) 100 MG tablet, Take 100 mg by mouth  2 (two) times daily., Disp: , Rfl: 3 .  travoprost, benzalkonium, (TRAVATAN) 0.004 % ophthalmic solution, Place 1 drop into both eyes at bedtime. , Disp: , Rfl:    Allergies  Allergen Reactions  . Atorvastatin Other (See Comments) and Swelling    unknown  . Clopidogrel   . Metoprolol Tartrate Swelling  . Pregabalin Swelling  . Rosuvastatin Calcium Other (See Comments)    unknown     Review of Systems  Constitutional: Negative for chills and fever.  Respiratory: Positive for cough and shortness of breath (when having coughing episode). Negative for wheezing (last night).   Cardiovascular: Negative for chest pain, palpitations and leg swelling.  Endocrine: Negative for polydipsia, polyphagia and polyuria.  Neurological: Negative for dizziness  and headaches.     Today's Vitals   06/06/18 1016  BP: (!) 150/80  Pulse: (!) 58  Temp: 98.7 F (37.1 C)  TempSrc: Oral  SpO2: 97%  Weight: 155 lb 6.4 oz (70.5 kg)  Height: 5\' 2"  (1.575 m)   Body mass index is 28.42 kg/m.   Objective:  Physical Exam Vitals signs reviewed.  Constitutional:      Appearance: Normal appearance.  HENT:     Head: Normocephalic and atraumatic.     Nose: Nose normal.     Mouth/Throat:     Mouth: Mucous membranes are moist.  Cardiovascular:     Rate and Rhythm: Normal rate and regular rhythm.     Pulses: Normal pulses.     Heart sounds: Normal heart sounds. No murmur.  Pulmonary:     Effort: Pulmonary effort is normal. No respiratory distress.     Breath sounds: Wheezing and rhonchi present. No rales.  Skin:    Capillary Refill: Capillary refill takes less than 2 seconds.  Neurological:     General: No focal deficit present.     Mental Status: She is alert.     Cranial Nerves: No cranial nerve deficit.  Psychiatric:        Mood and Affect: Mood normal.        Behavior: Behavior normal.        Thought Content: Thought content normal.        Judgment: Judgment normal.         Assessment And Plan:   1. Acute bronchitis, unspecified organism  Encouraged to rest and take steroid this may also break up the secretions which may develop into upper respiratory infection - triamcinolone acetonide (KENALOG-40) injection 40 mg - amoxicillin (AMOXIL) 875 MG tablet; Take 1 tablet (875 mg total) by mouth 2 (two) times daily.  Dispense: 14 tablet; Refill: 0 - HYDROcodone-homatropine (HYDROMET) 5-1.5 MG/5ML syrup; Take 5 mLs by mouth every 6 (six) hours as needed.  Dispense: 120 mL; Refill: 0 - albuterol (PROVENTIL HFA;VENTOLIN HFA) 108 (90 Base) MCG/ACT inhaler; Inhale 2 puffs into the lungs every 6 (six) hours as needed for wheezing or shortness of breath.  Dispense: 1 Inhaler; Refill: 2  2. Essential hypertension  Blood pressure is elevated  likely related to not feeling well.   She is advised to continue to monitor and to make sure she is taking HBP coricidan brand medications  Arnette Felts, FNP

## 2018-06-06 NOTE — Patient Instructions (Signed)
Acute Bronchitis, Adult Acute bronchitis is when air tubes (bronchi) in the lungs suddenly get swollen. The condition can make it hard to breathe. It can also cause these symptoms:  A cough.  Coughing up clear, yellow, or green mucus.  Wheezing.  Chest congestion.  Shortness of breath.  A fever.  Body aches.  Chills.  A sore throat. Follow these instructions at home:  Medicines  Take over-the-counter and prescription medicines only as told by your doctor.  If you were prescribed an antibiotic medicine, take it as told by your doctor. Do not stop taking the antibiotic even if you start to feel better. General instructions  Rest.  Drink enough fluids to keep your pee (urine) pale yellow.  Avoid smoking and secondhand smoke. If you smoke and you need help quitting, ask your doctor. Quitting will help your lungs heal faster.  Use an inhaler, cool mist vaporizer, or humidifier as told by your doctor.  Keep all follow-up visits as told by your doctor. This is important. How is this prevented? To lower your risk of getting this condition again:  Wash your hands often with soap and water. If you cannot use soap and water, use hand sanitizer.  Avoid contact with people who have cold symptoms.  Try not to touch your hands to your mouth, nose, or eyes.  Make sure to get the flu shot every year. Contact a doctor if:  Your symptoms do not get better in 2 weeks. Get help right away if:  You cough up blood.  You have chest pain.  You have very bad shortness of breath.  You become dehydrated.  You faint (pass out) or keep feeling like you are going to pass out.  You keep throwing up (vomiting).  You have a very bad headache.  Your fever or chills gets worse. This information is not intended to replace advice given to you by your health care provider. Make sure you discuss any questions you have with your health care provider. Document Released: 08/29/2007 Document  Revised: 10/24/2016 Document Reviewed: 08/31/2015 Elsevier Interactive Patient Education  2019 [employer]  

## 2018-06-15 NOTE — Progress Notes (Signed)
Subjective:     Patient ID: [name] , female    DOB: 1945/11/11 , 73 y.o.   MRN: [id number]   Chief Complaint  Patient presents with  . Depression    HPI  She was added to my schedule today by [facility] Advisor due to positive depression screen. Pt does not understand why she needs to be seen. States she had an argument with her husband this morning. She is adamant that she does not have a depressed mood on a regular basis. She does report being overwhelmed at home. She states her husband does not help with household chores. She also feels that she "does not have a voice". She does not have any desire to harm herself. She also admits to having trouble sleeping.   Depression           Past Medical History:  Diagnosis Date  . Benign hypertensive renal disease   . CKD stage 2 due to type 2 diabetes mellitus (HCC)   . Hypertension   . Hypothyroidism   . Obesity   . OSA on CPAP   . Pure hypercholesterolemia   . PVD (peripheral vascular disease) (HCC)   . Vitamin D deficiency      Family History  Problem Relation Age of Onset  . Alzheimer's disease Mother   . Aneurysm Father   . Cancer Brother      Current Outpatient Medications:  .  albuterol (PROVENTIL HFA;VENTOLIN HFA) 108 (90 Base) MCG/ACT inhaler, Inhale 2 puffs into the lungs every 6 (six) hours as needed for wheezing or shortness of breath., Disp: 1 Inhaler, Rfl: 2 .  amoxicillin (AMOXIL) 875 MG tablet, Take 1 tablet (875 mg total) by mouth 2 (two) times daily., Disp: 14 tablet, Rfl: 0 .  aspirin 81 MG tablet, Take 81 mg by mouth daily., Disp: , Rfl:  .  brimonidine (ALPHAGAN P) 0.1 % SOLN, Place 1 drop into both eyes every 8 (eight) hours., Disp: , Rfl:  .  calcium gluconate 500 MG tablet, Take 1 tablet by mouth 2 (two) times daily. , Disp: , Rfl:  .  cilostazol (PLETAL) 100 MG tablet, Take 100 mg by mouth 2 (two) times daily., Disp: , Rfl: 3 .  furosemide (LASIX) 20 MG tablet, Take 1 tablet (20 mg total)  by mouth daily., Disp: 90 tablet, Rfl: 1 .  hydrALAZINE (APRESOLINE) 10 MG tablet, Take 1 tablet (10 mg total) by mouth every 8 (eight) hours., Disp: 90 tablet, Rfl: 3 .  HYDROcodone-homatropine (HYDROMET) 5-1.5 MG/5ML syrup, Take 5 mLs by mouth every 6 (six) hours as needed., Disp: 120 mL, Rfl: 0 .  isosorbide mononitrate (IMDUR) 60 MG 24 hr tablet, Take 1 tablet (60 mg total) by mouth daily., Disp: 180 tablet, Rfl: 1 .  levothyroxine (SYNTHROID, LEVOTHROID) 125 MCG tablet, Take 1 tablet (125 mcg total) by mouth See admin instructions. 125 mcg by mouth daily on Mon thru Friday, Disp: 90 tablet, Rfl: 1 .  Multiple Vitamins-Minerals (MULTIVITAMIN WITH MINERALS) tablet, Take 1 tablet by mouth daily., Disp: , Rfl:  .  nitroGLYCERIN (NITROSTAT) 0.4 MG SL tablet, Place 1 tablet (0.4 mg total) under the tongue every 5 (five) minutes x 3 doses as needed for chest pain., Disp: 25 tablet, Rfl: 1 .  olmesartan (BENICAR) 40 MG tablet, Take 1 tablet (40 mg total) by mouth daily., Disp: 90 tablet, Rfl: 1 .  OZEMPIC 0.25 or 0.5 MG/DOSE SOPN, INJECT 0.5MG  INTO SKIN EVERY WEEK, IN THE ABDOMEN, THIGHS,  OR UPPER ARM ROTATING INJECTION SITES, Disp: , Rfl: 1 .  potassium chloride (K-DUR) 10 MEQ tablet, Take 10 mEq by mouth 2 (two) times daily., Disp: , Rfl: 3 .  temazepam (RESTORIL) 15 MG capsule, Take 1 capsule (15 mg total) by mouth at bedtime as needed for sleep., Disp: 30 capsule, Rfl: 0 .  travoprost, benzalkonium, (TRAVATAN) 0.004 % ophthalmic solution, Place 1 drop into both eyes at bedtime. , Disp: , Rfl:    Allergies  Allergen Reactions  . Atorvastatin Other (See Comments) and Swelling    unknown  . Clopidogrel   . Metoprolol Tartrate Swelling  . Pregabalin Swelling  . Rosuvastatin Calcium Other (See Comments)    unknown     Review of Systems  Constitutional: Negative.   Respiratory: Negative.   Cardiovascular: Negative.   Gastrointestinal: Negative.   Neurological: Negative.    Psychiatric/Behavioral: Positive for depression, dysphoric mood and sleep disturbance.     Today's Vitals   05/21/18 1044  BP: 130/80  Pulse: (!) 51  Temp: 97.6 F (36.4 C)  TempSrc: Oral  Weight: 155 lb 6.4 oz (70.5 kg)  Height: 5' 2.2" (1.58 m)  PainSc: 5   PainLoc: Knee   Body mass index is 28.24 kg/m.   Objective:  Physical Exam Vitals signs and nursing note reviewed.  Constitutional:      Appearance: Normal appearance.  HENT:     Head: Normocephalic and atraumatic.  Cardiovascular:     Rate and Rhythm: Normal rate and regular rhythm.     Heart sounds: Normal heart sounds.  Pulmonary:     Effort: Pulmonary effort is normal.     Breath sounds: Normal breath sounds.  Skin:    General: Skin is warm.  Neurological:     General: No focal deficit present.     Mental Status: She is alert.  Psychiatric:        Mood and Affect: Mood normal.        Behavior: Behavior normal.         Assessment And Plan:     1. Reactive depression  PHQ-9 reviewed. She does not wish to take another medication at this time.  She also declines therapy at this time. Greater than 50% of face to face time was spent in counseling and coordination of care. All questions were answered to her satisfaction. I spent more than 25 minutes with the patient.   2. Other insomnia  She reports she has tried melatonin without relief of her symptoms. She is encouraged to adopt a bedtime routine. Also encouraged to consider magnesium nightly. If persistent, I will consider rx temazepam nightly prn.         [name], MD

## 2018-06-23 ENCOUNTER — Encounter: Payer: Self-pay | Admitting: Internal Medicine

## 2018-06-23 ENCOUNTER — Ambulatory Visit: Payer: Medicare PPO | Admitting: Internal Medicine

## 2018-06-23 ENCOUNTER — Other Ambulatory Visit: Payer: Self-pay

## 2018-06-23 VITALS — BP 116/72 | HR 58 | Temp 98.0°F | Ht 62.4 in | Wt 155.2 lb

## 2018-06-23 DIAGNOSIS — E039 Hypothyroidism, unspecified: Secondary | ICD-10-CM

## 2018-06-23 DIAGNOSIS — I1310 Hypertensive heart and chronic kidney disease without heart failure, with stage 1 through stage 4 chronic kidney disease, or unspecified chronic kidney disease: Secondary | ICD-10-CM

## 2018-06-23 DIAGNOSIS — E1122 Type 2 diabetes mellitus with diabetic chronic kidney disease: Secondary | ICD-10-CM | POA: Diagnosis not present

## 2018-06-23 DIAGNOSIS — N182 Chronic kidney disease, stage 2 (mild): Secondary | ICD-10-CM | POA: Diagnosis not present

## 2018-06-23 DIAGNOSIS — R0982 Postnasal drip: Secondary | ICD-10-CM

## 2018-06-23 DIAGNOSIS — E663 Overweight: Secondary | ICD-10-CM

## 2018-06-23 DIAGNOSIS — J309 Allergic rhinitis, unspecified: Secondary | ICD-10-CM

## 2018-06-23 MED ORDER — HYDROCODONE-HOMATROPINE 5-1.5 MG/5ML PO SYRP: 5.0000 mL | ORAL_SOLUTION | Freq: Four times a day (QID) | ORAL | 0 refills | Status: DC | PRN

## 2018-06-23 NOTE — Progress Notes (Signed)
Subjective:     Patient ID: [name] , female    DOB: 07-Sep-1945 , 73 y.o.   MRN: [id number]   Chief Complaint  Patient presents with  . Diabetes  . Hypertension    HPI  Diabetes  She presents for her follow-up diabetic visit. She has type 2 diabetes mellitus. There are no hypoglycemic associated symptoms. Pertinent negatives for diabetes include no blurred vision and no chest pain. There are no hypoglycemic complications. Diabetic complications include heart disease and nephropathy. Risk factors for coronary artery disease include diabetes mellitus, dyslipidemia, hypertension and post-menopausal. She participates in exercise intermittently. Eye exam is current.  Hypertension  This is a chronic problem. The current episode started more than 1 year ago. The problem has been gradually improving since onset. The problem is controlled. Pertinent negatives include no blurred vision or chest pain.     Past Medical History:  Diagnosis Date  . Benign hypertensive renal disease   . CKD stage 2 due to type 2 diabetes mellitus ([place])   . Hypertension   . Hypothyroidism   . Obesity   . OSA on CPAP   . Pure hypercholesterolemia   . PVD (peripheral vascular disease) ([place])   . Vitamin D deficiency      Family History  Problem Relation Age of Onset  . Alzheimer's disease Mother   . Aneurysm Father   . Cancer Brother      Current Outpatient Medications:  .  albuterol (PROVENTIL HFA;VENTOLIN HFA) 108 (90 Base) MCG/ACT inhaler, Inhale 2 puffs into the lungs every 6 (six) hours as needed for wheezing or shortness of breath., Disp: 1 Inhaler, Rfl: 2 .  aspirin 81 MG tablet, Take 81 mg by mouth daily., Disp: , Rfl:  .  brimonidine (ALPHAGAN P) 0.1 % SOLN, Place 1 drop into both eyes every 8 (eight) hours., Disp: , Rfl:  .  calcium gluconate 500 MG tablet, Take 1 tablet by mouth 2 (two) times daily. , Disp: , Rfl:  .  cilostazol (PLETAL) 100 MG tablet, Take 100 mg by mouth daily. ,  Disp: , Rfl: 3 .  furosemide (LASIX) 20 MG tablet, Take 1 tablet (20 mg total) by mouth daily., Disp: 90 tablet, Rfl: 1 .  hydrALAZINE (APRESOLINE) 10 MG tablet, Take 1 tablet (10 mg total) by mouth every 8 (eight) hours. (Patient taking differently: Take 10 mg by mouth 3 (three) times daily. ), Disp: 90 tablet, Rfl: 3 .  HYDROcodone-homatropine (HYDROMET) 5-1.5 MG/5ML syrup, Take 5 mLs by mouth every 6 (six) hours as needed., Disp: 120 mL, Rfl: 0 .  isosorbide mononitrate (IMDUR) 60 MG 24 hr tablet, Take 1 tablet (60 mg total) by mouth daily., Disp: 180 tablet, Rfl: 1 .  levothyroxine (SYNTHROID, LEVOTHROID) 125 MCG tablet, Take 1 tablet (125 mcg total) by mouth See admin instructions. 125 mcg by mouth daily on Mon thru Friday, Disp: 90 tablet, Rfl: 1 .  Multiple Vitamins-Minerals (MULTIVITAMIN WITH MINERALS) tablet, Take 1 tablet by mouth daily., Disp: , Rfl:  .  nitroGLYCERIN (NITROSTAT) 0.4 MG SL tablet, Place 1 tablet (0.4 mg total) under the tongue every 5 (five) minutes x 3 doses as needed for chest pain., Disp: 25 tablet, Rfl: 1 .  olmesartan (BENICAR) 40 MG tablet, Take 1 tablet (40 mg total) by mouth daily., Disp: 90 tablet, Rfl: 1 .  OZEMPIC 0.25 or 0.5 MG/DOSE SOPN, INJECT 0.5MG INTO SKIN EVERY WEEK, IN THE ABDOMEN, THIGHS, OR UPPER ARM ROTATING INJECTION SITES, Disp: ,  Rfl: 1 .  potassium chloride (K-DUR) 10 MEQ tablet, Take 10 mEq by mouth once. , Disp: , Rfl: 3 .  travoprost, benzalkonium, (TRAVATAN) 0.004 % ophthalmic solution, Place 1 drop into both eyes at bedtime. , Disp: , Rfl:    Allergies  Allergen Reactions  . Atorvastatin Other (See Comments) and Swelling    unknown  . Clopidogrel   . Metoprolol Tartrate Swelling  . Pregabalin Swelling  . Rosuvastatin Calcium Other (See Comments)    unknown     Review of Systems  Constitutional: Negative.   HENT: Positive for postnasal drip.   Eyes: Negative for blurred vision.  Respiratory: Positive for cough.   Cardiovascular:  Negative.  Negative for chest pain.  Gastrointestinal: Negative.   Neurological: Negative.   Psychiatric/Behavioral: Negative.      Today's Vitals   06/23/18 0855  BP: 116/72  Pulse: (!) 58  Temp: 98 F (36.7 C)  TempSrc: Oral  Weight: 155 lb 3.2 oz (70.4 kg)  Height: 5' 2.4" (1.585 m)  PainSc: 0-No pain   Body mass index is 28.02 kg/m.   Objective:  Physical Exam Vitals signs and nursing note reviewed.  Constitutional:      Appearance: Normal appearance.  HENT:     Head: Normocephalic and atraumatic.  Cardiovascular:     Rate and Rhythm: Normal rate and regular rhythm.     Heart sounds: Normal heart sounds.  Pulmonary:     Effort: Pulmonary effort is normal.     Breath sounds: Normal breath sounds.  Skin:    General: Skin is warm.  Neurological:     General: No focal deficit present.     Mental Status: She is alert.  Psychiatric:        Mood and Affect: Mood normal.        Behavior: Behavior normal.         Assessment And Plan:     1. Type 2 diabetes mellitus with stage 2 chronic kidney disease, without long-term current use of insulin ([place])  I will check labs as listed below. She feels she may need to increase her dose of Ozempic. She is currently taking 0.48m once weekly. I will make further recommendations once her labs are available for review.   - Lipid panel - CMP14+EGFR - Hemoglobin A1c  2. Benign hypertensive heart and renal disease  Well controlled. She will continue with current meds. She is encouraged to limit her salt intake.   3. Primary hypothyroidism  I will check thyroid panel and adjust meds as needed.  - TSH - T4, Free  4. Allergic rhinitis with postnasal drip  She will be given rx hydromet to use prn. Also advised to use loratadine 182mas needed.   - HYDROcodone-homatropine (HYDROMET) 5-1.5 MG/5ML syrup; Take 5 mLs by mouth every 6 (six) hours as needed.  Dispense: 120 mL; Refill: 0  5. Overweight (BMI 25.0-29.9)  Her  goal is to lose 5 more pounds.   RoMaximino GreenlandMD

## 2018-06-24 LAB — CMP14+EGFR
ALK PHOS: 56 IU/L (ref 39–117)
ALT: 14 IU/L (ref 0–32)
AST: 31 IU/L (ref 0–40)
Albumin/Globulin Ratio: 2.1 (ref 1.2–2.2)
Albumin: 4.4 g/dL (ref 3.7–4.7)
BUN / CREAT RATIO: 17 (ref 12–28)
BUN: 16 mg/dL (ref 8–27)
Bilirubin Total: 0.5 mg/dL (ref 0.0–1.2)
CALCIUM: 10 mg/dL (ref 8.7–10.3)
CO2: 24 mmol/L (ref 20–29)
CREATININE: 0.94 mg/dL (ref 0.57–1.00)
Chloride: 106 mmol/L (ref 96–106)
GFR calc Af Amer: 70 mL/min/{1.73_m2} (ref >59)
GFR, EST NON AFRICAN AMERICAN: 60 mL/min/{1.73_m2} (ref >59)
GLOBULIN, TOTAL: 2.1 g/dL (ref 1.5–4.5)
Glucose: 109 mg/dL — ABNORMAL HIGH (ref 65–99)
Potassium: 4.6 mmol/L (ref 3.5–5.2)
SODIUM: 143 mmol/L (ref 134–144)
Total Protein: 6.5 g/dL (ref 6.0–8.5)

## 2018-06-24 LAB — LIPID PANEL
Chol/HDL Ratio: 3.4 ratio (ref 0.0–4.4)
Cholesterol, Total: 203 mg/dL — ABNORMAL HIGH (ref 100–199)
HDL: 59 mg/dL (ref >39)
LDL CALC: 133 mg/dL — AB (ref 0–99)
Triglycerides: 53 mg/dL (ref 0–149)
VLDL Cholesterol Cal: 11 mg/dL (ref 5–40)

## 2018-06-24 LAB — T4, FREE: Free T4: 1.1 ng/dL (ref 0.82–1.77)

## 2018-06-24 LAB — TSH: TSH: 3.54 u[IU]/mL (ref 0.450–4.500)

## 2018-06-24 LAB — HEMOGLOBIN A1C
Est. average glucose Bld gHb Est-mCnc: 120 mg/dL
Hgb A1c MFr Bld: 5.8 % — ABNORMAL HIGH (ref 4.8–5.6)

## 2018-06-27 ENCOUNTER — Encounter: Payer: Self-pay | Admitting: Nurse Practitioner

## 2018-07-03 DIAGNOSIS — H409 Unspecified glaucoma: Secondary | ICD-10-CM | POA: Diagnosis not present

## 2018-07-03 DIAGNOSIS — E1151 Type 2 diabetes mellitus with diabetic peripheral angiopathy without gangrene: Secondary | ICD-10-CM | POA: Diagnosis not present

## 2018-07-03 DIAGNOSIS — J449 Chronic obstructive pulmonary disease, unspecified: Secondary | ICD-10-CM | POA: Diagnosis not present

## 2018-07-03 DIAGNOSIS — I10 Essential (primary) hypertension: Secondary | ICD-10-CM | POA: Diagnosis not present

## 2018-07-03 DIAGNOSIS — I25119 Atherosclerotic heart disease of native coronary artery with unspecified angina pectoris: Secondary | ICD-10-CM | POA: Diagnosis not present

## 2018-07-03 DIAGNOSIS — E876 Hypokalemia: Secondary | ICD-10-CM | POA: Diagnosis not present

## 2018-07-03 DIAGNOSIS — G4700 Insomnia, unspecified: Secondary | ICD-10-CM | POA: Diagnosis not present

## 2018-07-03 DIAGNOSIS — E890 Postprocedural hypothyroidism: Secondary | ICD-10-CM | POA: Diagnosis not present

## 2018-07-03 DIAGNOSIS — I739 Peripheral vascular disease, unspecified: Secondary | ICD-10-CM | POA: Diagnosis not present

## 2018-07-18 ENCOUNTER — Telehealth: Payer: Self-pay

## 2018-07-18 ENCOUNTER — Other Ambulatory Visit: Payer: Self-pay

## 2018-07-18 DIAGNOSIS — G4733 Obstructive sleep apnea (adult) (pediatric): Secondary | ICD-10-CM | POA: Diagnosis not present

## 2018-07-18 NOTE — Telephone Encounter (Signed)
  Calling to give pt this message from provider Her cholesterol is elevated, she has not tolerated statins in the past.   She really needs to be on cholesterol medication. However, if she wants to wait until later this summer, that is up to her. I understand that she may not want to try something new b/c not knowing if she will experience side effects and may need to go to ER for evaluation. NO one wants to go to ER right now.    RS        Pt called and said pharmacy called and said that they received a rx from you for a statin. I dont see it but I do see where you asked her if she wanted to start something for chol that wasn't a statin. Pt said that she doesn't want to start any new medications unless she really has to

## 2018-07-18 NOTE — Telephone Encounter (Signed)
-----   Message from [name], MD sent at 07/18/2018  9:33 AM EDT ----- Her cholesterol is elevated, she has not tolerated statins in the past.   She really needs to be on cholesterol medication. However, if she wants to wait until later this summer, that is up to her. I understand that she may not want to try something new b/c not knowing if she will experience side effects and may need to go to ER for evaluation. NO one wants to go to ER right now.    RS ----- Message ----- From: Delana Meyer Sent: 07/18/2018   9:22 AM EDT To: [name], MD  Pt called and said pharmacy called and said that they received a rx from you for a statin. I dont see it but I do see where you asked her if she wanted to start something for chol that wasn't a statin. Pt said that she doesn't want to start any new medications unless she really has to

## 2018-07-30 DIAGNOSIS — E119 Type 2 diabetes mellitus without complications: Secondary | ICD-10-CM | POA: Diagnosis not present

## 2018-07-30 DIAGNOSIS — Z794 Long term (current) use of insulin: Secondary | ICD-10-CM | POA: Diagnosis not present

## 2018-07-30 DIAGNOSIS — E1165 Type 2 diabetes mellitus with hyperglycemia: Secondary | ICD-10-CM | POA: Diagnosis not present

## 2018-08-16 ENCOUNTER — Other Ambulatory Visit: Payer: Self-pay | Admitting: Internal Medicine

## 2018-09-23 ENCOUNTER — Other Ambulatory Visit: Payer: Self-pay

## 2018-09-23 ENCOUNTER — Encounter: Payer: Self-pay | Admitting: Internal Medicine

## 2018-09-23 ENCOUNTER — Ambulatory Visit: Payer: Medicare PPO | Admitting: Internal Medicine

## 2018-09-23 VITALS — BP 112/66 | HR 72 | Temp 98.6°F | Ht 62.4 in | Wt 151.0 lb

## 2018-09-23 DIAGNOSIS — I1310 Hypertensive heart and chronic kidney disease without heart failure, with stage 1 through stage 4 chronic kidney disease, or unspecified chronic kidney disease: Secondary | ICD-10-CM

## 2018-09-23 DIAGNOSIS — G44219 Episodic tension-type headache, not intractable: Secondary | ICD-10-CM

## 2018-09-23 DIAGNOSIS — N182 Chronic kidney disease, stage 2 (mild): Secondary | ICD-10-CM

## 2018-09-23 DIAGNOSIS — E7800 Pure hypercholesterolemia, unspecified: Secondary | ICD-10-CM

## 2018-09-23 DIAGNOSIS — E1122 Type 2 diabetes mellitus with diabetic chronic kidney disease: Secondary | ICD-10-CM | POA: Diagnosis not present

## 2018-09-23 MED ORDER — MAGNESIUM 250 MG PO TABS: 250.0000 mg | ORAL_TABLET | Freq: Every day | ORAL | 1 refills | Status: DC

## 2018-09-23 NOTE — Patient Instructions (Signed)
Tension Headache, Adult A tension headache is pain, pressure, or aching in your head. Tension headaches can last from 30 minutes to several days. Follow these instructions at home: Managing pain  Take over-the-counter and prescription medicines only as told by your doctor.  When you have a headache, lie down in a dark, quiet room.  If told, put ice on your head and neck: ? Put ice in a plastic bag. ? Place a towel between your skin and the bag. ? Leave the ice on for 20 minutes, 2-3 times a day.  If told, put heat on the back of your neck. Do this as often as your doctor tells you to. Use the kind of heat that your doctor recommends, such as a moist heat pack or a heating pad. ? Place a towel between your skin and the heat. ? Leave the heat on for 20-30 minutes. ? Remove the heat if your skin turns bright red. Eating and drinking  Eat meals on a regular schedule.  Watch how much alcohol you drink: ? If you are a woman and are not pregnant, do not drink more than 1 drink a day. ? If you are a man, do not drink more than 2 drinks a day.  Drink enough fluid to keep your pee (urine) pale yellow.  Do not use a lot of caffeine, or stop using caffeine. Lifestyle  Get enough sleep. Get 7-9 hours of sleep each night. Or get the amount of sleep that your doctor tells you to.  At bedtime, remove all electronic devices from your room. Examples of electronic devices are computers, phones, and tablets.  Find ways to lessen your stress. Some things that can lessen stress are: ? Exercise. ? Deep breathing. ? Yoga. ? Music. ? Positive thoughts.  Sit up straight. Do not tighten (tense) your muscles.  Do not use any products that have nicotine or tobacco in them, such as cigarettes and e-cigarettes. If you need help quitting, ask your doctor. General instructions   Keep all follow-up visits as told by your doctor. This is important.  Avoid things that can bring on headaches. Keep a  journal to find out if certain things bring on headaches. For example, write down: ? What you eat and drink. ? How much sleep you get. ? Any change to your diet or medicines. Contact a doctor if:  Your headache does not get better.  Your headache comes back.  You have a headache and sounds, light, or smells bother you.  You feel sick to your stomach (nauseous) or you throw up (vomit).  Your stomach hurts. Get help right away if:  You suddenly get a very bad headache along with any of these: ? A stiff neck. ? Feeling sick to your stomach. ? Throwing up. ? Feeling weak. ? Trouble seeing. ? Feeling short of breath. ? A rash. ? Feeling unusually sleepy. ? Trouble speaking. ? Pain in your eye or ear. ? Trouble walking or balancing. ? Feeling like you will pass out (faint). ? Passing out. Summary  A tension headache is pain, pressure, or aching in your head.  Tension headaches can last from 30 minutes to several days.  Lifestyle changes and medicines may help relieve pain. This information is not intended to replace advice given to you by your health care provider. Make sure you discuss any questions you have with your health care provider. Document Released: 06/06/2009 Document Revised: 02/22/2017 Document Reviewed: 06/22/2016 Elsevier Patient Education  2020 Elsevier   Inc.  

## 2018-09-30 DIAGNOSIS — Z794 Long term (current) use of insulin: Secondary | ICD-10-CM | POA: Diagnosis not present

## 2018-09-30 DIAGNOSIS — E119 Type 2 diabetes mellitus without complications: Secondary | ICD-10-CM | POA: Diagnosis not present

## 2018-09-30 DIAGNOSIS — E1165 Type 2 diabetes mellitus with hyperglycemia: Secondary | ICD-10-CM | POA: Diagnosis not present

## 2018-10-01 ENCOUNTER — Telehealth: Payer: Self-pay

## 2018-10-01 NOTE — Telephone Encounter (Signed)
The pt said that she was letting Dr.sanders know that [place] will be contacting the office about seeing what cholesterol medication the pt is going to be starting.  The pt said she only knows that she won't be taking it daily.

## 2018-10-01 NOTE — Telephone Encounter (Signed)
Please send rosuvastatin 20mg  one tablet once weekly #12/2 refills--thanks

## 2018-10-03 ENCOUNTER — Other Ambulatory Visit: Payer: Self-pay

## 2018-10-03 MED ORDER — ROSUVASTATIN CALCIUM 20 MG PO TABS: 20.0000 mg | ORAL_TABLET | ORAL | 2 refills | Status: DC

## 2018-10-03 NOTE — Telephone Encounter (Signed)
I returned the pt's call and left a message that Dr. Baird Cancer sent Crestor 20 mg to [facility] and the pt is to take it once weekly.

## 2018-10-05 NOTE — Progress Notes (Signed)
Subjective:     Patient ID: [name] , female    DOB: 1946-01-16 , 73 y.o.   MRN: [id number]   Chief Complaint  Patient presents with  . Diabetes  . Hypertension    HPI  Diabetes She presents for her follow-up diabetic visit. She has type 2 diabetes mellitus. Hypoglycemia symptoms include headaches. Pertinent negatives for diabetes include no blurred vision and no chest pain. There are no hypoglycemic complications. Diabetic complications include heart disease and nephropathy. Risk factors for coronary artery disease include diabetes mellitus, dyslipidemia, hypertension and post-menopausal. She is following a diabetic diet. She participates in exercise intermittently. An ACE inhibitor/angiotensin II receptor blocker is being taken. Eye exam is current.  Hypertension This is a chronic problem. The current episode started more than 1 year ago. The problem has been gradually improving since onset. The problem is controlled. Associated symptoms include headaches. Pertinent negatives include no blurred vision or chest pain. The current treatment provides moderate improvement. 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     Past Medical History:  Diagnosis Date  . Benign hypertensive renal disease   . CKD stage 2 due to type 2 diabetes mellitus ([place])   . Hypertension   . Hypothyroidism   . Obesity   . OSA on CPAP   . Pure hypercholesterolemia   . PVD (peripheral vascular disease) ([place])   . Vitamin D deficiency      Family History  Problem Relation Age of Onset  . Alzheimer's disease Mother   . Aneurysm Father   . Cancer Brother      Current Outpatient Medications:  .  albuterol (PROVENTIL HFA;VENTOLIN HFA) 108 (90 Base) MCG/ACT inhaler, Inhale 2 puffs into the lungs every 6 (six) hours as needed for wheezing or shortness of breath., Disp: 1 Inhaler, Rfl: 2 .  aspirin 81 MG tablet, Take 81 mg by mouth daily., Disp: , Rfl:  .  brimonidine (ALPHAGAN  P) 0.1 % SOLN, Place 1 drop into both eyes every 8 (eight) hours., Disp: , Rfl:  .  calcium gluconate 500 MG tablet, Take 1 tablet by mouth 2 (two) times daily. , Disp: , Rfl:  .  cilostazol (PLETAL) 100 MG tablet, Take 100 mg by mouth daily. , Disp: , Rfl: 3 .  furosemide (LASIX) 20 MG tablet, TAKE 1 TABLET EVERY DAY, Disp: 90 tablet, Rfl: 1 .  hydrALAZINE (APRESOLINE) 10 MG tablet, Take 1 tablet (10 mg total) by mouth every 8 (eight) hours. (Patient taking differently: Take 10 mg by mouth 3 (three) times daily. ), Disp: 90 tablet, Rfl: 3 .  isosorbide mononitrate (IMDUR) 60 MG 24 hr tablet, Take 1 tablet (60 mg total) by mouth daily., Disp: 180 tablet, Rfl: 1 .  levothyroxine (SYNTHROID) 125 MCG tablet, TAKE 1 TABLET DAILY ON MONDAY THRU FRIDAY, Disp: 65 tablet, Rfl: 3 .  Multiple Vitamins-Minerals (MULTIVITAMIN WITH MINERALS) tablet, Take 1 tablet by mouth daily., Disp: , Rfl:  .  nitroGLYCERIN (NITROSTAT) 0.4 MG SL tablet, Place 1 tablet (0.4 mg total) under the tongue every 5 (five) minutes x 3 doses as needed for chest pain., Disp: 25 tablet, Rfl: 1 .  olmesartan (BENICAR) 40 MG tablet, TAKE 1 TABLET EVERY DAY, Disp: 90 tablet, Rfl: 1 .  OZEMPIC 0.25 or 0.5 MG/DOSE SOPN, INJECT 0.5MG INTO SKIN EVERY WEEK, IN THE ABDOMEN, THIGHS, OR UPPER ARM ROTATING INJECTION SITES, Disp: , Rfl: 1 .  potassium chloride (K-DUR) 10 MEQ tablet, Take 10 mEq by mouth  once. , Disp: , Rfl: 3 .  travoprost, benzalkonium, (TRAVATAN) 0.004 % ophthalmic solution, Place 1 drop into both eyes at bedtime. , Disp: , Rfl:  .  HYDROcodone-homatropine (HYDROMET) 5-1.5 MG/5ML syrup, Take 5 mLs by mouth every 6 (six) hours as needed. (Patient not taking: Reported on 09/23/2018), Disp: 120 mL, Rfl: 0 .  Magnesium 250 MG TABS, Take 1 tablet (250 mg total) by mouth at bedtime., Disp: 90 tablet, Rfl: 1 .  rosuvastatin (CRESTOR) 20 MG tablet, Take 1 tablet (20 mg total) by mouth once a week., Disp: 12 tablet, Rfl: 2 .  temazepam  (RESTORIL) 15 MG capsule, Take 15 mg by mouth at bedtime as needed for sleep., Disp: , Rfl:    Allergies  Allergen Reactions  . Atorvastatin Other (See Comments) and Swelling    unknown  . Clopidogrel   . Metoprolol Tartrate Swelling  . Pregabalin Swelling  . Rosuvastatin Calcium Other (See Comments)    unknown     Review of Systems  Constitutional: Negative.   Eyes: Negative for blurred vision.  Respiratory: Negative.   Cardiovascular: Negative.  Negative for chest pain.  Gastrointestinal: Negative.   Neurological: Positive for headaches.       She c/o headache in the back of her head. Denies neck pain. Denies visual disturbances. Unable to determine what triggers her sx.   Psychiatric/Behavioral: Negative.      Today's Vitals   09/23/18 0953  BP: 112/66  Pulse: 72  Temp: 98.6 F (37 C)  TempSrc: Oral  Weight: 151 lb (68.5 kg)  Height: 5' 2.4" (1.585 m)  PainSc: 0-No pain   Body mass index is 27.27 kg/m.   Objective:  Physical Exam Vitals signs and nursing note reviewed.  Constitutional:      Appearance: Normal appearance.  HENT:     Head: Normocephalic and atraumatic.  Neck:     Musculoskeletal: Normal range of motion.     Comments: Shoulders/neck tender to deep palpation Cardiovascular:     Rate and Rhythm: Normal rate and regular rhythm.     Heart sounds: Normal heart sounds.  Pulmonary:     Effort: Pulmonary effort is normal.     Breath sounds: Normal breath sounds.  Skin:    General: Skin is warm.  Neurological:     General: No focal deficit present.     Mental Status: She is alert.  Psychiatric:        Mood and Affect: Mood normal.        Behavior: Behavior normal.         Assessment And Plan:     1. Type 2 diabetes mellitus with stage 2 chronic kidney disease, without long-term current use of insulin ([place])  I will check labs as listed below. She was congratulated on the lifestyle changes she has made thus far. She is encouraged to keep up  the great work.   - Hemoglobin A1c - BMP8+EGFR  2. Benign hypertensive heart and renal disease  Well controlled. She will continue with current meds. She is encouraged to avoid adding salt to her foods.   3. Pure hypercholesterolemia  Chronic. She is encouraged to avoid fried foods and to take meds as direction.   4. Episodic tension-type headache, not intractable  She is encouraged to take magnesium nightly. Additionally, advised to use muscle rub on neck/shoulders nightly as needed.   [name], MD    THE PATIENT IS ENCOURAGED TO PRACTICE SOCIAL DISTANCING DUE TO THE COVID-19 PANDEMIC.

## 2018-10-06 ENCOUNTER — Other Ambulatory Visit: Payer: Self-pay

## 2018-10-06 ENCOUNTER — Telehealth: Payer: Self-pay

## 2018-10-06 NOTE — Telephone Encounter (Signed)
I left the pt a message that the pt needed to return to the office for blood work that she didn't stop for when she had her last visit.

## 2018-10-22 ENCOUNTER — Encounter: Payer: Self-pay | Admitting: Nurse Practitioner

## 2018-10-27 ENCOUNTER — Other Ambulatory Visit: Payer: Self-pay

## 2018-10-27 ENCOUNTER — Ambulatory Visit (INDEPENDENT_AMBULATORY_CARE_PROVIDER_SITE_OTHER): Payer: Medicare PPO | Admitting: Adult Health

## 2018-10-27 ENCOUNTER — Encounter: Payer: Self-pay | Admitting: Adult Health

## 2018-10-27 ENCOUNTER — Ambulatory Visit: Payer: Medicare Other | Admitting: Nurse Practitioner

## 2018-10-27 VITALS — BP 116/68 | HR 54 | Temp 96.0°F | Ht 62.5 in | Wt 151.4 lb

## 2018-10-27 DIAGNOSIS — Z9989 Dependence on other enabling machines and devices: Secondary | ICD-10-CM | POA: Diagnosis not present

## 2018-10-27 DIAGNOSIS — G4733 Obstructive sleep apnea (adult) (pediatric): Secondary | ICD-10-CM | POA: Diagnosis not present

## 2018-10-27 NOTE — Progress Notes (Signed)
PATIENT: Catherine PlumberMarie Butler [name] DOB: 1945-10-27  REASON FOR VISIT: follow up HISTORY FROM: patient  HISTORY OF PRESENT ILLNESS: Today 10/27/18:  [name] is a 73 year old female with a history of obstructive sleep apnea on CPAP.  She returns today for follow-up.  She states that she has stopped using the CPAP.  She states that there was no reason other than she just got busy and got out of the habit.  She states that she restarted using the CPAP on July 11.  Her download is from June 30 to July 29.  It indicates that she use the machine 18 out of 30 days for compliance of 60%.  She use machine greater than 4 hours each night.  On average she uses machine 7 hours and 18 minutes.  Her residual AHI is 1.9 on 5 to 12 cm of water with EPR of 3.  Her leak in the 95th percentile is 25.2 L/min.  She states that the CPAP is working well for her.  She denies any new issues.  HISTORY 7/29/2019CM [name], 73 year old female returns for follow-up with history of obstructive sleep apnea here for CPAP compliance.  She is not having any problems with her CPAP. Compliance data dated 09/17/2017-10/16/2017 shows compliance greater than 4 hours at 97%.  Average usage 5 hours 47 minutes.  Set pressure 5 to 12 cm.  EPR level 3 AHI 2.2 ESS 4.  She reports a heart stent in May of this year.  She returns for reevaluation  REVIEW OF SYSTEMS: Out of a complete 14 system review of symptoms, the patient complains only of the following symptoms, and all other reviewed systems are negative.  Epworth sleepiness score is 1 and fatigue severity score is 16  ALLERGIES: Allergies  Allergen Reactions  . Atorvastatin Other (See Comments) and Swelling    unknown  . Clopidogrel   . Metoprolol Other (See Comments)    Slows heart rate to low  . Metoprolol Tartrate Swelling  . Pregabalin Swelling  . Rosuvastatin Calcium Other (See Comments)    unknown    HOME MEDICATIONS: Outpatient Medications Prior to Visit  Medication  Sig Dispense Refill  . albuterol (PROVENTIL HFA;VENTOLIN HFA) 108 (90 Base) MCG/ACT inhaler Inhale 2 puffs into the lungs every 6 (six) hours as needed for wheezing or shortness of breath. 1 Inhaler 2  . amLODipine (NORVASC) 5 MG tablet     . aspirin 81 MG tablet Take 81 mg by mouth daily.    . brimonidine (ALPHAGAN P) 0.1 % SOLN Place 1 drop into both eyes every 8 (eight) hours.    . calcium gluconate 500 MG tablet Take 1 tablet by mouth 2 (two) times daily.     . cilostazol (PLETAL) 100 MG tablet Take 100 mg by mouth daily.   3  . furosemide (LASIX) 20 MG tablet TAKE 1 TABLET EVERY DAY 90 tablet 1  . hydrALAZINE (APRESOLINE) 10 MG tablet Take 1 tablet (10 mg total) by mouth every 8 (eight) hours. (Patient taking differently: Take 10 mg by mouth 3 (three) times daily. ) 90 tablet 3  . isosorbide mononitrate (IMDUR) 60 MG 24 hr tablet Take 1 tablet (60 mg total) by mouth daily. 180 tablet 1  . levothyroxine (SYNTHROID) 125 MCG tablet TAKE 1 TABLET DAILY ON MONDAY THRU FRIDAY 65 tablet 3  . Magnesium 250 MG TABS Take 1 tablet (250 mg total) by mouth at bedtime. 90 tablet 1  . Multiple Vitamins-Minerals (MULTIVITAMIN WITH MINERALS) tablet  Take 1 tablet by mouth daily.    . nitroGLYCERIN (NITROSTAT) 0.4 MG SL tablet Place 1 tablet (0.4 mg total) under the tongue every 5 (five) minutes x 3 doses as needed for chest pain. 25 tablet 1  . olmesartan (BENICAR) 40 MG tablet TAKE 1 TABLET EVERY DAY 90 tablet 1  . OZEMPIC 0.25 or 0.5 MG/DOSE SOPN INJECT 0.5MG  INTO SKIN EVERY WEEK, IN THE ABDOMEN, THIGHS, OR UPPER ARM ROTATING INJECTION SITES  1  . potassium chloride (K-DUR) 10 MEQ tablet Take 10 mEq by mouth once.   3  . rosuvastatin (CRESTOR) 20 MG tablet Take 1 tablet (20 mg total) by mouth once a week. 12 tablet 2  . temazepam (RESTORIL) 15 MG capsule Take 15 mg by mouth at bedtime as needed for sleep.    . travoprost, benzalkonium, (TRAVATAN) 0.004 % ophthalmic solution Place 1 drop into both eyes at  bedtime.     [name] HYDROcodone-homatropine (HYDROMET) 5-1.5 MG/5ML syrup Take 5 mLs by mouth every 6 (six) hours as needed. (Patient not taking: Reported on 09/23/2018) 120 mL 0   No facility-administered medications prior to visit.     PAST MEDICAL HISTORY: Past Medical History:  Diagnosis Date  . Benign hypertensive renal disease   . CKD stage 2 due to type 2 diabetes mellitus ([place])   . Hypertension   . Hypothyroidism   . Obesity   . OSA on CPAP   . Pure hypercholesterolemia   . PVD (peripheral vascular disease) ([place])   . Vitamin D deficiency     PAST SURGICAL HISTORY: Past Surgical History:  Procedure Laterality Date  . ABDOMINAL HYSTERECTOMY    . BREAST CYST EXCISION Right over 20 yrs  . broken leg and displaced ankle    . DILATION AND CURETTAGE OF UTERUS    . LEFT HEART CATH AND CORONARY ANGIOGRAPHY N/A 06/13/2017   Procedure: LEFT HEART CATH AND CORONARY ANGIOGRAPHY;  Surgeon: Dixie Dials, MD;  Location: [facility];  Service: Cardiovascular;  Laterality: N/A;  . right and left rotator cuff    . THYROIDECTOMY      FAMILY HISTORY: Family History  Problem Relation Age of Onset  . Alzheimer's disease Mother   . Aneurysm Father   . Cancer Brother     SOCIAL HISTORY: Social History   Socioeconomic History  . Marital status: Married    Spouse name: Not on file  . Number of children: Not on file  . Years of education: Not on file  . Highest education level: Not on file  Occupational History  . Occupation: retired  Scientific laboratory technician  . Financial resource strain: Not hard at all  . Food insecurity    Worry: Never true    Inability: Never true  . Transportation needs    Medical: No    Non-medical: No  Tobacco Use  . Smoking status: Never Smoker  . Smokeless tobacco: Never Used  Substance and Sexual Activity  . Alcohol use: No    Alcohol/week: 0.0 standard drinks  . Drug use: No  . Sexual activity: Yes  Lifestyle  . Physical activity    Days per week: 0  days    Minutes per session: 0 min  . Stress: Not at all  Relationships  . Social Herbalist on phone: Not on file    Gets together: Not on file    Attends religious service: Not on file    Active member of club or organization: Not  on file    Attends meetings of clubs or organizations: Not on file    Relationship status: Not on file  . Intimate partner violence    Fear of current or ex partner: No    Emotionally abused: No    Physically abused: No    Forced sexual activity: No  Other Topics Concern  . Not on file  Social History Narrative   Drinks 1-2 cups daily of coffee daily.      PHYSICAL EXAM  Vitals:   10/27/18 0757  BP: 116/68  Pulse: (!) 54  Temp: (!) 96 F (35.6 C)  Weight: 151 lb 6.4 oz (68.7 kg)  Height: 5' 2.5" (1.588 m)   Body mass index is 27.25 kg/m.  Generalized: Well developed, in no acute distress  Chest: Lungs clear to auscultation bilaterally  Neurological examination  Mentation: Alert oriented to time, place, history taking. Follows all commands speech and language fluent Cranial nerve II-XII:  Extraocular movements were full, visual field were full on confrontational test. Head turning and shoulder shrug  were normal and symmetric. Motor: The motor testing reveals 5 over 5 strength of all 4 extremities. Good symmetric motor tone is noted throughout.  Sensory: Sensory testing is intact to soft touch on all 4 extremities. No evidence of extinction is noted.  Gait and station: Gait is normal.    DIAGNOSTIC DATA (LABS, IMAGING, TESTING) - I reviewed patient records, labs, notes, testing and imaging myself where available.  Lab Results  Component Value Date   WBC 4.1 12/15/2017   HGB 11.6 (L) 12/15/2017   HCT 34.6 (L) 12/15/2017   MCV 91.1 12/15/2017   PLT 286 12/15/2017      Component Value Date/Time   NA 143 06/23/2018 0944   K 4.6 06/23/2018 0944   CL 106 06/23/2018 0944   CO2 24 06/23/2018 0944   GLUCOSE 109 (H)  06/23/2018 0944   GLUCOSE 105 (H) 12/15/2017 1847   BUN 16 06/23/2018 0944   CREATININE 0.94 06/23/2018 0944   CALCIUM 10.0 06/23/2018 0944   PROT 6.5 06/23/2018 0944   ALBUMIN 4.4 06/23/2018 0944   AST 31 06/23/2018 0944   ALT 14 06/23/2018 0944   ALKPHOS 56 06/23/2018 0944   BILITOT 0.5 06/23/2018 0944   GFRNONAA 60 06/23/2018 0944   GFRAA 70 06/23/2018 0944   Lab Results  Component Value Date   CHOL 203 (H) 06/23/2018   HDL 59 06/23/2018   LDLCALC 133 (H) 06/23/2018   TRIG 53 06/23/2018   CHOLHDL 3.4 06/23/2018   Lab Results  Component Value Date   HGBA1C 5.8 (H) 06/23/2018   No results found for: VITAMINB12 Lab Results  Component Value Date   TSH 3.540 06/23/2018      ASSESSMENT AND PLAN 73 y.o. year old female  has a past medical history of Benign hypertensive renal disease, CKD stage 2 due to type 2 diabetes mellitus ([facility]), Hypertension, Hypothyroidism, Obesity, OSA on CPAP, Pure hypercholesterolemia, PVD (peripheral vascular disease) ([facility]), and Vitamin D deficiency. here with:  1.  Obstructive sleep apnea on CPAP  Patient CPAP download shows suboptimal compliance and good treatment of her apnea.  She is encouraged to use the CPAP nightly and greater than 4 hours each night.  She is advised that if her symptoms worsen or she develops new symptoms she should let us know.  She will follow-up in 1 year or sooner if needed    I spent 15 minutes with the patient. 50% of this  time was spent reviewing CPAP download   Butch PennyMegan Jesyca Weisenburger, MSN, NP-C 10/27/2018, 8:09 AM [facility] [address], [address] [place], [place] [id number] [phone]

## 2018-10-27 NOTE — Patient Instructions (Signed)

## 2018-10-31 ENCOUNTER — Telehealth: Payer: Self-pay

## 2018-10-31 NOTE — Telephone Encounter (Signed)
Left a message to call the office back.

## 2018-10-31 NOTE — Telephone Encounter (Signed)
-----   Message from Glendale Chard, MD sent at 10/30/2018  8:42 PM EDT ----- Please clarify the dose she is currently taking. I do not recall what she wanted to do. How are her sugars? If she is on 0.25, did she want to try stopping the medication?  RS ----- Message ----- From: Michelle Nasuti, [place] Sent: 10/30/2018   4:57 PM EDT To: Glendale Chard, MD  The pt left a message that she was told not to open her last 2 boxes of ozempic after she finished her current pen and that she is now finished and wants to know what she is to do next.

## 2018-11-13 ENCOUNTER — Encounter: Payer: Self-pay | Admitting: Internal Medicine

## 2018-11-13 ENCOUNTER — Other Ambulatory Visit: Payer: Self-pay

## 2018-11-13 ENCOUNTER — Ambulatory Visit (INDEPENDENT_AMBULATORY_CARE_PROVIDER_SITE_OTHER): Payer: Medicare PPO | Admitting: Internal Medicine

## 2018-11-13 VITALS — BP 112/68 | HR 53 | Temp 98.3°F | Ht 61.8 in | Wt 155.8 lb

## 2018-11-13 DIAGNOSIS — Z23 Encounter for immunization: Secondary | ICD-10-CM | POA: Diagnosis not present

## 2018-11-13 DIAGNOSIS — E039 Hypothyroidism, unspecified: Secondary | ICD-10-CM

## 2018-11-13 DIAGNOSIS — E1122 Type 2 diabetes mellitus with diabetic chronic kidney disease: Secondary | ICD-10-CM | POA: Diagnosis not present

## 2018-11-13 DIAGNOSIS — I1310 Hypertensive heart and chronic kidney disease without heart failure, with stage 1 through stage 4 chronic kidney disease, or unspecified chronic kidney disease: Secondary | ICD-10-CM | POA: Diagnosis not present

## 2018-11-13 DIAGNOSIS — E7800 Pure hypercholesterolemia, unspecified: Secondary | ICD-10-CM | POA: Diagnosis not present

## 2018-11-13 DIAGNOSIS — M791 Myalgia, unspecified site: Secondary | ICD-10-CM

## 2018-11-13 DIAGNOSIS — E663 Overweight: Secondary | ICD-10-CM

## 2018-11-13 DIAGNOSIS — N182 Chronic kidney disease, stage 2 (mild): Secondary | ICD-10-CM | POA: Diagnosis not present

## 2018-11-13 NOTE — Patient Instructions (Signed)

## 2018-11-14 LAB — CK: Total CK: 577 U/L (ref 32–182)

## 2018-11-14 LAB — LIPID PANEL
Chol/HDL Ratio: 2.2 ratio (ref 0.0–4.4)
Cholesterol, Total: 140 mg/dL (ref 100–199)
HDL: 65 mg/dL (ref >39)
LDL Calculated: 64 mg/dL (ref 0–99)
Triglycerides: 54 mg/dL (ref 0–149)
VLDL Cholesterol Cal: 11 mg/dL (ref 5–40)

## 2018-11-14 LAB — HEMOGLOBIN A1C
Est. average glucose Bld gHb Est-mCnc: 117 mg/dL
Hgb A1c MFr Bld: 5.7 % — ABNORMAL HIGH (ref 4.8–5.6)

## 2018-11-14 LAB — TSH: TSH: 0.616 u[IU]/mL (ref 0.450–4.500)

## 2018-11-16 NOTE — Progress Notes (Signed)
Subjective:     Patient ID: [name] , female    DOB: 10-Apr-1945 , 73 y.o.   MRN: [id number]   Chief Complaint  Patient presents with  . Diabetes  . Hypertension    HPI  She is here today for dm f/u. She reports that she stopped the Ozempic about two weeks ago. She is hoping that she can go without the medication. She reports the cost is too much now that she is in the donut hold. She reports her sugars are 120 or less when fasting. They do not go above 160 after eating.   Diabetes She presents for her follow-up diabetic visit. She has type 2 diabetes mellitus. There are no hypoglycemic associated symptoms. Pertinent negatives for diabetes include no blurred vision and no chest pain. There are no hypoglycemic complications. Risk factors for coronary artery disease include diabetes mellitus, dyslipidemia, hypertension, sedentary lifestyle and post-menopausal. She never participates in exercise.  Hypertension This is a chronic problem. The current episode started more than 1 year ago. The problem has been gradually improving since onset. The problem is controlled. Pertinent negatives include no blurred vision, chest pain, palpitations or shortness of breath. The current treatment provides moderate improvement.     Past Medical History:  Diagnosis Date  . Benign hypertensive renal disease   . CKD stage 2 due to type 2 diabetes mellitus ([place])   . Hypertension   . Hypothyroidism   . Obesity   . OSA on CPAP   . Pure hypercholesterolemia   . PVD (peripheral vascular disease) ([place])   . Vitamin D deficiency      Family History  Problem Relation Age of Onset  . Alzheimer's disease Mother   . Aneurysm Father   . Cancer Brother      Current Outpatient Medications:  .  albuterol (PROVENTIL HFA;VENTOLIN HFA) 108 (90 Base) MCG/ACT inhaler, Inhale 2 puffs into the lungs every 6 (six) hours as needed for wheezing or shortness of breath., Disp: 1 Inhaler, Rfl: 2 .  amLODipine  (NORVASC) 5 MG tablet, , Disp: , Rfl:  .  aspirin 81 MG tablet, Take 81 mg by mouth daily., Disp: , Rfl:  .  brimonidine (ALPHAGAN P) 0.1 % SOLN, Place 1 drop into both eyes every 8 (eight) hours., Disp: , Rfl:  .  calcium gluconate 500 MG tablet, Take 1 tablet by mouth daily. , Disp: , Rfl:  .  cilostazol (PLETAL) 100 MG tablet, Take 100 mg by mouth daily. , Disp: , Rfl: 3 .  ELDERBERRY PO, Take by mouth. 1 teaspoon 3 times per day, Disp: , Rfl:  .  furosemide (LASIX) 20 MG tablet, TAKE 1 TABLET EVERY DAY, Disp: 90 tablet, Rfl: 1 .  hydrALAZINE (APRESOLINE) 10 MG tablet, Take 1 tablet (10 mg total) by mouth every 8 (eight) hours. (Patient taking differently: Take 10 mg by mouth 3 (three) times daily. ), Disp: 90 tablet, Rfl: 3 .  isosorbide mononitrate (IMDUR) 60 MG 24 hr tablet, Take 1 tablet (60 mg total) by mouth daily., Disp: 180 tablet, Rfl: 1 .  levothyroxine (SYNTHROID) 125 MCG tablet, TAKE 1 TABLET DAILY ON MONDAY THRU FRIDAY, Disp: 65 tablet, Rfl: 3 .  Magnesium 250 MG TABS, Take 1 tablet (250 mg total) by mouth at bedtime., Disp: 90 tablet, Rfl: 1 .  Multiple Vitamins-Minerals (MULTIVITAMIN WITH MINERALS) tablet, Take 1 tablet by mouth daily., Disp: , Rfl:  .  nitroGLYCERIN (NITROSTAT) 0.4 MG SL tablet, Place 1 tablet (0.4  mg total) under the tongue every 5 (five) minutes x 3 doses as needed for chest pain., Disp: 25 tablet, Rfl: 1 .  olmesartan (BENICAR) 40 MG tablet, TAKE 1 TABLET EVERY DAY, Disp: 90 tablet, Rfl: 1 .  OZEMPIC 0.25 or 0.5 MG/DOSE SOPN, INJECT 0.5MG  INTO SKIN EVERY WEEK, IN THE ABDOMEN, THIGHS, OR UPPER ARM ROTATING INJECTION SITES, Disp: , Rfl: 1 .  potassium chloride (K-DUR) 10 MEQ tablet, Take 10 mEq by mouth once. , Disp: , Rfl: 3 .  rosuvastatin (CRESTOR) 20 MG tablet, Take 1 tablet (20 mg total) by mouth once a week., Disp: 12 tablet, Rfl: 2 .  travoprost, benzalkonium, (TRAVATAN) 0.004 % ophthalmic solution, Place 1 drop into both eyes at bedtime. , Disp: , Rfl:     Allergies  Allergen Reactions  . Atorvastatin Other (See Comments) and Swelling    unknown  . Clopidogrel   . Metoprolol Other (See Comments)    Slows heart rate to low  . Metoprolol Tartrate Swelling  . Pregabalin Swelling  . Rosuvastatin Calcium Other (See Comments)    unknown     Review of Systems  Constitutional: Negative.   Eyes: Negative for blurred vision.  Respiratory: Negative.  Negative for shortness of breath.   Cardiovascular: Negative.  Negative for chest pain and palpitations.  Gastrointestinal: Negative.   Musculoskeletal: Positive for myalgias.       She did start statin as prescribed. Unfortunately, she developed debilitating muscle aches. She did keep taking the medication.  Neurological: Negative.   Psychiatric/Behavioral: Negative.      Today's Vitals   11/13/18 0943  BP: 112/68  Pulse: (!) 53  Temp: 98.3 F (36.8 C)  TempSrc: Oral  Weight: 155 lb 12.8 oz (70.7 kg)  Height: 5' 1.8" (1.57 m)  PainSc: 0-No pain   Body mass index is 28.68 kg/m.   Objective:  Physical Exam Vitals signs and nursing note reviewed.  Constitutional:      Appearance: Normal appearance.  HENT:     Head: Normocephalic and atraumatic.  Cardiovascular:     Rate and Rhythm: Normal rate and regular rhythm.     Heart sounds: Normal heart sounds.  Pulmonary:     Effort: Pulmonary effort is normal.     Breath sounds: Normal breath sounds.  Skin:    General: Skin is warm.  Neurological:     General: No focal deficit present.     Mental Status: She is alert.  Psychiatric:        Mood and Affect: Mood normal.        Behavior: Behavior normal.         Assessment And Plan:     1. Type 2 diabetes mellitus with stage 2 chronic kidney disease, without long-term current use of insulin (H[facility])  I will check an a1c today, per our discussion. She will rto in 3 months for re-evaluation. She is willing to resume 0.25mg  once weekly if her sugars steadily rise. Pt advised that  she can likely go without meds as long as her a1c does not go above 7.0.  Patient is agreeable to CCM referral to determine if she is eligible for patient assistance for Ozempic.   - Hemoglobin A1c  2. Benign hypertensive heart and renal disease  Well controlled. She will continue with current meds. She is encouraged to limit her salt intake.    3. Pure hypercholesterolemia  Chronic, I will check lipid panel.  She has been taking statin once weekly. We  discussed use of PCSK-9 agents to help lower chol; however, she is concerned that this class of medication will place her in donut hole quicker.   - Lipid Profile  4. Primary hypothyroidism  I will check thyroid panel and adjust meds as needed.  - TSH  5. Myalgia  She is advised to stop rosuvastatin. She is encouraged to increase her water intake as well. She may tolerate pravastatin. I will consider once I have these results back.   - CK, total  6. Overweight (BMI 25.0-29.9)  Her BMI is 28. Her weight is stable. This is an acceptable weight for her demographic.   7. Encounter for immunization  - Flu vaccine HIGH DOSE PF (Fluzone High dose)        Gwynneth Alimentobyn N Costantino Kohlbeck, MD    THE PATIENT IS ENCOURAGED TO PRACTICE SOCIAL DISTANCING DUE TO THE COVID-19 PANDEMIC.

## 2018-11-19 ENCOUNTER — Ambulatory Visit: Payer: Self-pay

## 2018-11-19 ENCOUNTER — Other Ambulatory Visit: Payer: Self-pay

## 2018-11-19 DIAGNOSIS — I1310 Hypertensive heart and chronic kidney disease without heart failure, with stage 1 through stage 4 chronic kidney disease, or unspecified chronic kidney disease: Secondary | ICD-10-CM

## 2018-11-19 DIAGNOSIS — N182 Chronic kidney disease, stage 2 (mild): Secondary | ICD-10-CM

## 2018-11-19 DIAGNOSIS — E1122 Type 2 diabetes mellitus with diabetic chronic kidney disease: Secondary | ICD-10-CM

## 2018-11-19 MED ORDER — ROSUVASTATIN CALCIUM 20 MG PO TABS: 20.0000 mg | ORAL_TABLET | ORAL | 2 refills | Status: DC

## 2018-11-19 NOTE — Chronic Care Management (AMB) (Signed)
  Chronic Care Management   Outreach Note  11/19/2018 Name: [name] MRN: [id number] DOB: [birth year]  Referred by: Glendale Chard, MD Reason for referral : Assistance with medication costs related to Ozempic  An unsuccessful telephone outreach was attempted today. The patient was referred to the case management team by for assistance with chronic care management and care coordination.   Follow Up Plan: A HIPPA compliant phone message was left for the patient providing contact information and requesting a return call.  The care management team will reach out to the patient again over the next 10 days.   Daneen Schick, BSW, CDP Social Worker, Certified Dementia Practitioner [place] [facility] [place] [facility] [phone]

## 2018-11-24 ENCOUNTER — Ambulatory Visit: Payer: Self-pay

## 2018-11-24 DIAGNOSIS — E1122 Type 2 diabetes mellitus with diabetic chronic kidney disease: Secondary | ICD-10-CM

## 2018-11-24 DIAGNOSIS — I1310 Hypertensive heart and chronic kidney disease without heart failure, with stage 1 through stage 4 chronic kidney disease, or unspecified chronic kidney disease: Secondary | ICD-10-CM

## 2018-11-24 DIAGNOSIS — N182 Chronic kidney disease, stage 2 (mild): Secondary | ICD-10-CM

## 2018-11-24 NOTE — Chronic Care Management (AMB) (Signed)
  Chronic Care Management   Outreach Note  11/24/2018 Name: [name] MRN: [id number] DOB: 07-31-1945  Referred by: Glendale Chard, MD Reason for referral : Care Coordination   A second unsuccessful telephone outreach was attempted today. The patient was referred to the case management team for assistance with chronic care management and care coordination.   Follow Up Plan: A HIPPA compliant phone message was left for the patient providing contact information and requesting a return call.  The care management team will reach out to the patient again over the next 10 days.   Daneen Schick, BSW, CDP Social Worker, Certified Dementia Practitioner [place] [facility] [phone]

## 2018-11-28 DIAGNOSIS — Z794 Long term (current) use of insulin: Secondary | ICD-10-CM | POA: Diagnosis not present

## 2018-11-28 DIAGNOSIS — E119 Type 2 diabetes mellitus without complications: Secondary | ICD-10-CM | POA: Diagnosis not present

## 2018-11-28 DIAGNOSIS — E1165 Type 2 diabetes mellitus with hyperglycemia: Secondary | ICD-10-CM | POA: Diagnosis not present

## 2018-11-28 DIAGNOSIS — Z1211 Encounter for screening for malignant neoplasm of colon: Secondary | ICD-10-CM | POA: Diagnosis not present

## 2018-12-02 ENCOUNTER — Ambulatory Visit: Payer: Self-pay

## 2018-12-02 ENCOUNTER — Telehealth: Payer: Self-pay

## 2018-12-02 DIAGNOSIS — E1122 Type 2 diabetes mellitus with diabetic chronic kidney disease: Secondary | ICD-10-CM

## 2018-12-02 NOTE — Telephone Encounter (Signed)
Called pt to see if she is having any issues with Rosuvastatin pt stated she has chest tightness when taking the medication. I asked pt to stop taking the medication until she hears back from Dr. Baird Cancer

## 2018-12-02 NOTE — Chronic Care Management (AMB) (Signed)
  Chronic Care Management   Outreach Note  12/02/2018 Name: [name] MRN: [id number] DOB: 01/14/1946  Referred by: Glendale Chard, MD Reason for referral : Medication assistance related to the cost of [place] unsuccessful telephone outreach was attempted today. The patient was referred to the case management team for assistance with chronic care management and care coordination. The patient's primary care provider has been notified of our unsuccessful attempts to make or maintain contact with the patient. The care management team is pleased to engage with this patient at any time in the future should he/she be interested in assistance from the care management team.    Daneen Schick, BSW, CDP Social Worker, Certified Dementia Practitioner [facility] [place] [facility] [phone]

## 2018-12-03 ENCOUNTER — Other Ambulatory Visit: Payer: Self-pay | Admitting: Internal Medicine

## 2018-12-04 ENCOUNTER — Telehealth: Payer: Self-pay

## 2018-12-04 NOTE — Telephone Encounter (Signed)
Left message for pt to return call    since pt is having s/e from rosuvastatin, pls confirm she is willing to try another medication once weekly. if yes, please send pravastatin 40mg  once weekly #12/0 refills to her mail order insurance

## 2018-12-30 ENCOUNTER — Ambulatory Visit: Payer: Medicare PPO | Admitting: Internal Medicine

## 2019-01-05 ENCOUNTER — Other Ambulatory Visit: Payer: Self-pay | Admitting: Internal Medicine

## 2019-01-05 DIAGNOSIS — Z1231 Encounter for screening mammogram for malignant neoplasm of breast: Secondary | ICD-10-CM

## 2019-01-29 DIAGNOSIS — E1165 Type 2 diabetes mellitus with hyperglycemia: Secondary | ICD-10-CM | POA: Diagnosis not present

## 2019-01-29 DIAGNOSIS — E119 Type 2 diabetes mellitus without complications: Secondary | ICD-10-CM | POA: Diagnosis not present

## 2019-01-29 DIAGNOSIS — Z794 Long term (current) use of insulin: Secondary | ICD-10-CM | POA: Diagnosis not present

## 2019-02-12 ENCOUNTER — Encounter: Payer: Self-pay | Admitting: Internal Medicine

## 2019-02-12 ENCOUNTER — Ambulatory Visit (INDEPENDENT_AMBULATORY_CARE_PROVIDER_SITE_OTHER): Payer: Medicare PPO | Admitting: Internal Medicine

## 2019-02-12 ENCOUNTER — Other Ambulatory Visit: Payer: Self-pay

## 2019-02-12 VITALS — BP 116/78 | HR 51 | Temp 97.8°F | Ht 61.8 in | Wt 158.0 lb

## 2019-02-12 DIAGNOSIS — E1122 Type 2 diabetes mellitus with diabetic chronic kidney disease: Secondary | ICD-10-CM

## 2019-02-12 DIAGNOSIS — I1310 Hypertensive heart and chronic kidney disease without heart failure, with stage 1 through stage 4 chronic kidney disease, or unspecified chronic kidney disease: Secondary | ICD-10-CM | POA: Diagnosis not present

## 2019-02-12 DIAGNOSIS — E7800 Pure hypercholesterolemia, unspecified: Secondary | ICD-10-CM

## 2019-02-12 DIAGNOSIS — E663 Overweight: Secondary | ICD-10-CM

## 2019-02-12 DIAGNOSIS — N182 Chronic kidney disease, stage 2 (mild): Secondary | ICD-10-CM | POA: Diagnosis not present

## 2019-02-12 DIAGNOSIS — Z6829 Body mass index (BMI) 29.0-29.9, adult: Secondary | ICD-10-CM

## 2019-02-12 NOTE — Patient Instructions (Signed)
Diabetes Mellitus and Exercise Exercising regularly is important for your overall health, especially when you have diabetes (diabetes mellitus). Exercising is not only about losing weight. It has many other health benefits, such as increasing muscle strength and bone density and reducing body fat and stress. This leads to improved fitness, flexibility, and endurance, all of which result in better overall health. Exercise has additional benefits for people with diabetes, including:  Reducing appetite.  Helping to lower and control blood glucose.  Lowering blood pressure.  Helping to control amounts of fatty substances (lipids) in the blood, such as cholesterol and triglycerides.  Helping the body to respond better to insulin (improving insulin sensitivity).  Reducing how much insulin the body needs.  Decreasing the risk for heart disease by: ? Lowering cholesterol and triglyceride levels. ? Increasing the levels of good cholesterol. ? Lowering blood glucose levels. What is my activity plan? Your health care provider or certified diabetes educator can help you make a plan for the type and frequency of exercise (activity plan) that works for you. Make sure that you:  Do at least 150 minutes of moderate-intensity or vigorous-intensity exercise each week. This could be brisk walking, biking, or water aerobics. ? Do stretching and strength exercises, such as yoga or weightlifting, at least 2 times a week. ? Spread out your activity over at least 3 days of the week.  Get some form of physical activity every day. ? Do not go more than 2 days in a row without some kind of physical activity. ? Avoid being inactive for more than 30 minutes at a time. Take frequent breaks to walk or stretch.  Choose a type of exercise or activity that you enjoy, and set realistic goals.  Start slowly, and gradually increase the intensity of your exercise over time. What do I need to know about managing my  diabetes?   Check your blood glucose before and after exercising. ? If your blood glucose is 240 mg/dL (13.3 mmol/L) or higher before you exercise, check your urine for ketones. If you have ketones in your urine, do not exercise until your blood glucose returns to normal. ? If your blood glucose is 100 mg/dL (5.6 mmol/L) or lower, eat a snack containing 15-20 grams of carbohydrate. Check your blood glucose 15 minutes after the snack to make sure that your level is above 100 mg/dL (5.6 mmol/L) before you start your exercise.  Know the symptoms of low blood glucose (hypoglycemia) and how to treat it. Your risk for hypoglycemia increases during and after exercise. Common symptoms of hypoglycemia can include: ? Hunger. ? Anxiety. ? Sweating and feeling clammy. ? Confusion. ? Dizziness or feeling light-headed. ? Increased heart rate or palpitations. ? Blurry vision. ? Tingling or numbness around the mouth, lips, or tongue. ? Tremors or shakes. ? Irritability.  Keep a rapid-acting carbohydrate snack available before, during, and after exercise to help prevent or treat hypoglycemia.  Avoid injecting insulin into areas of the body that are going to be exercised. For example, avoid injecting insulin into: ? The arms, when playing tennis. ? The legs, when jogging.  Keep records of your exercise habits. Doing this can help you and your health care provider adjust your diabetes management plan as needed. Write down: ? Food that you eat before and after you exercise. ? Blood glucose levels before and after you exercise. ? The type and amount of exercise you have done. ? When your insulin is expected to peak, if you use   insulin. Avoid exercising at times when your insulin is peaking.  When you start a new exercise or activity, work with your health care provider to make sure the activity is safe for you, and to adjust your insulin, medicines, or food intake as needed.  Drink plenty of water while  you exercise to prevent dehydration or heat stroke. Drink enough fluid to keep your urine clear or pale yellow. Summary  Exercising regularly is important for your overall health, especially when you have diabetes (diabetes mellitus).  Exercising has many health benefits, such as increasing muscle strength and bone density and reducing body fat and stress.  Your health care provider or certified diabetes educator can help you make a plan for the type and frequency of exercise (activity plan) that works for you.  When you start a new exercise or activity, work with your health care provider to make sure the activity is safe for you, and to adjust your insulin, medicines, or food intake as needed. This information is not intended to replace advice given to you by your health care provider. Make sure you discuss any questions you have with your health care provider. Document Released: 06/02/2003 Document Revised: 10/04/2016 Document Reviewed: 08/22/2015 Elsevier Patient Education  2020 Elsevier Inc.  

## 2019-02-13 LAB — HEMOGLOBIN A1C
Est. average glucose Bld gHb Est-mCnc: 134 mg/dL
Hgb A1c MFr Bld: 6.3 % — ABNORMAL HIGH (ref 4.8–5.6)

## 2019-02-13 LAB — CMP14+EGFR
ALT: 27 IU/L (ref 0–32)
AST: 43 IU/L — ABNORMAL HIGH (ref 0–40)
Albumin/Globulin Ratio: 2.4 — ABNORMAL HIGH (ref 1.2–2.2)
Albumin: 4.4 g/dL (ref 3.7–4.7)
Alkaline Phosphatase: 60 IU/L (ref 39–117)
BUN/Creatinine Ratio: 16 (ref 12–28)
BUN: 16 mg/dL (ref 8–27)
Bilirubin Total: 0.3 mg/dL (ref 0.0–1.2)
CO2: 26 mmol/L (ref 20–29)
Calcium: 10 mg/dL (ref 8.7–10.3)
Chloride: 105 mmol/L (ref 96–106)
Creatinine, Ser: 1.02 mg/dL — ABNORMAL HIGH (ref 0.57–1.00)
GFR calc Af Amer: 63 mL/min/{1.73_m2} (ref >59)
GFR calc non Af Amer: 55 mL/min/{1.73_m2} — ABNORMAL LOW (ref >59)
Globulin, Total: 1.8 g/dL (ref 1.5–4.5)
Glucose: 150 mg/dL — ABNORMAL HIGH (ref 65–99)
Potassium: 4.4 mmol/L (ref 3.5–5.2)
Sodium: 143 mmol/L (ref 134–144)
Total Protein: 6.2 g/dL (ref 6.0–8.5)

## 2019-02-18 ENCOUNTER — Ambulatory Visit
Admission: RE | Admit: 2019-02-18 | Discharge: 2019-02-18 | Disposition: A | Discharge status: Home / Self Care | Payer: Medicare PPO | Source: Ambulatory Visit | Attending: Internal Medicine | Admitting: Internal Medicine

## 2019-02-18 ENCOUNTER — Other Ambulatory Visit: Payer: Self-pay

## 2019-02-18 DIAGNOSIS — Z1231 Encounter for screening mammogram for malignant neoplasm of breast: Secondary | ICD-10-CM | POA: Diagnosis not present

## 2019-02-20 NOTE — Progress Notes (Signed)
Subjective:     Patient ID: [name] , female    DOB: Feb 05, 1946 , 73 y.o.   MRN: [id number]   Chief Complaint  Patient presents with  . Diabetes  . Hyperlipidemia    HPI  Diabetes She presents for her follow-up diabetic visit. She has type 2 diabetes mellitus. Pertinent negatives for hypoglycemia include no headaches. Pertinent negatives for diabetes include no blurred vision and no chest pain. There are no hypoglycemic complications. Diabetic complications include heart disease and nephropathy. Risk factors for coronary artery disease include diabetes mellitus, dyslipidemia, hypertension and post-menopausal. She is following a diabetic diet. She participates in exercise intermittently. An ACE inhibitor/angiotensin II receptor blocker is being taken. Eye exam is current.  Hyperlipidemia This is a chronic problem. The current episode started more than 1 year ago. The problem is controlled. Exacerbating diseases include chronic renal disease, diabetes and obesity. Pertinent negatives include no chest pain. She is currently on no antihyperlipidemic treatment. The current treatment provides moderate improvement of lipids.  Hypertension This is a chronic problem. The current episode started more than 1 year ago. The problem has been gradually improving since onset. The problem is controlled. Pertinent negatives include no blurred vision, chest pain or headaches. The current treatment provides moderate improvement. 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 Identifiable causes of hypertension include chronic renal disease.     Past Medical History:  Diagnosis Date  . Benign hypertensive renal disease   . CKD stage 2 due to type 2 diabetes mellitus ([place])   . Hypertension   . Hypothyroidism   . Obesity   . OSA on CPAP   . Pure hypercholesterolemia   . PVD (peripheral vascular disease) ([place])   . Vitamin D deficiency      Family History  Problem Relation  Age of Onset  . Alzheimer's disease Mother   . Aneurysm Father   . Cancer Brother      Current Outpatient Medications:  .  albuterol (PROVENTIL HFA;VENTOLIN HFA) 108 (90 Base) MCG/ACT inhaler, Inhale 2 puffs into the lungs every 6 (six) hours as needed for wheezing or shortness of breath., Disp: 1 Inhaler, Rfl: 2 .  amLODipine (NORVASC) 5 MG tablet, , Disp: , Rfl:  .  aspirin 81 MG tablet, Take 81 mg by mouth daily., Disp: , Rfl:  .  brimonidine (ALPHAGAN P) 0.1 % SOLN, Place 1 drop into both eyes every 8 (eight) hours., Disp: , Rfl:  .  calcium gluconate 500 MG tablet, Take 1 tablet by mouth daily. , Disp: , Rfl:  .  cilostazol (PLETAL) 100 MG tablet, Take 100 mg by mouth daily. , Disp: , Rfl: 3 .  ELDERBERRY PO, Take by mouth. 1 teaspoon 3 times per day, Disp: , Rfl:  .  furosemide (LASIX) 20 MG tablet, TAKE 1 TABLET EVERY DAY, Disp: 90 tablet, Rfl: 1 .  hydrALAZINE (APRESOLINE) 10 MG tablet, Take 1 tablet (10 mg total) by mouth every 8 (eight) hours. (Patient taking differently: Take 10 mg by mouth 3 (three) times daily. ), Disp: 90 tablet, Rfl: 3 .  isosorbide mononitrate (IMDUR) 60 MG 24 hr tablet, Take 1 tablet (60 mg total) by mouth daily., Disp: 180 tablet, Rfl: 1 .  levothyroxine (SYNTHROID) 125 MCG tablet, TAKE 1 TABLET DAILY ON MONDAY THRU FRIDAY, Disp: 65 tablet, Rfl: 3 .  Magnesium 250 MG TABS, Take 1 tablet (250 mg total) by mouth at bedtime., Disp: 90 tablet, Rfl: 1 .  Multiple Vitamins-Minerals (MULTIVITAMIN  WITH MINERALS) tablet, Take 1 tablet by mouth daily., Disp: , Rfl:  .  nitroGLYCERIN (NITROSTAT) 0.4 MG SL tablet, Place 1 tablet (0.4 mg total) under the tongue every 5 (five) minutes x 3 doses as needed for chest pain., Disp: 25 tablet, Rfl: 1 .  olmesartan (BENICAR) 40 MG tablet, TAKE 1 TABLET EVERY DAY, Disp: 90 tablet, Rfl: 1 .  OZEMPIC 0.25 or 0.5 MG/DOSE SOPN, INJECT 0.5MG INTO SKIN EVERY WEEK, IN THE ABDOMEN, THIGHS, OR UPPER ARM ROTATING INJECTION SITES, Disp: ,  Rfl: 1 .  potassium chloride (K-DUR) 10 MEQ tablet, Take 10 mEq by mouth once. , Disp: , Rfl: 3   Allergies  Allergen Reactions  . Atorvastatin Other (See Comments) and Swelling    unknown  . Clopidogrel   . Metoprolol Other (See Comments)    Slows heart rate to low  . Metoprolol Tartrate Swelling  . Pregabalin Swelling  . Rosuvastatin Calcium Other (See Comments)    unknown     Review of Systems  Constitutional: Negative.   Eyes: Negative for blurred vision.  Respiratory: Negative.   Cardiovascular: Negative.  Negative for chest pain.  Gastrointestinal: Negative.   Neurological: Negative for headaches.  Psychiatric/Behavioral: Negative.      Today's Vitals   02/12/19 0921  BP: 116/78  Pulse: (!) 51  Temp: 97.8 F (36.6 C)  TempSrc: Oral  Weight: 158 lb (71.7 kg)  Height: 5' 1.8" (1.57 m)  PainSc: 0-No pain   Body mass index is 29.09 kg/m.   Objective:  Physical Exam Vitals signs and nursing note reviewed.  Constitutional:      Appearance: Normal appearance.  HENT:     Head: Normocephalic and atraumatic.  Cardiovascular:     Rate and Rhythm: Normal rate and regular rhythm.     Heart sounds: Normal heart sounds.  Pulmonary:     Effort: Pulmonary effort is normal.     Breath sounds: Normal breath sounds.  Skin:    General: Skin is warm.  Neurological:     General: No focal deficit present.     Mental Status: She is alert.  Psychiatric:        Mood and Affect: Mood normal.        Behavior: Behavior normal.         Assessment And Plan:     1. Type 2 diabetes mellitus with stage 2 chronic kidney disease, without long-term current use of insulin ([place])  She agrees to [facility] referral for medication assistance. I will check labs as listed below. She is encouraged to exercise 20-30 minutes four to five days per week.   - CCM Nurse - Hemoglobin A1c - CMP14+EGFR  2. Pure hypercholesterolemia  Chronic. Importance of rx with statin therapy was discussed  with the patient.   3. Benign hypertensive heart and renal disease  Chronic. Well controlled. She will continue with current meds. She is encouraged to avoid adding salt to her foods.   4. Overweight with body mass index (BMI) of 29 to 29.9 in adult  She is encouraged to strive for BMI less than 26 to decrease cardiac risk.    [name], MD    THE PATIENT IS ENCOURAGED TO PRACTICE SOCIAL DISTANCING DUE TO THE COVID-19 PANDEMIC.

## 2019-02-24 ENCOUNTER — Ambulatory Visit: Payer: Self-pay

## 2019-02-24 DIAGNOSIS — N182 Chronic kidney disease, stage 2 (mild): Secondary | ICD-10-CM

## 2019-02-24 DIAGNOSIS — E1122 Type 2 diabetes mellitus with diabetic chronic kidney disease: Secondary | ICD-10-CM

## 2019-02-24 NOTE — Chronic Care Management (AMB) (Signed)
  Chronic Care Management   Outreach Note  02/24/2019 Name: [name] MRN: [id number] DOB: 1946-01-25  Referred by: Glendale Chard, MD Reason for referral : Care Coordination   An unsuccessful telephone outreach was attempted today. The patient was referred to the case management team by for assistance with care management and care coordination.   Follow Up Plan: A HIPPA compliant phone message was left for the patient providing contact information and requesting a return call.  The care management team will reach out to the patient again over the next 14 days.   Daneen Schick, BSW, CDP Social Worker, Certified Dementia Practitioner [place] [facility] [facility] [phone]

## 2019-02-25 ENCOUNTER — Ambulatory Visit: Payer: Self-pay

## 2019-02-25 DIAGNOSIS — E7800 Pure hypercholesterolemia, unspecified: Secondary | ICD-10-CM

## 2019-02-25 DIAGNOSIS — E1122 Type 2 diabetes mellitus with diabetic chronic kidney disease: Secondary | ICD-10-CM

## 2019-02-25 DIAGNOSIS — E039 Hypothyroidism, unspecified: Secondary | ICD-10-CM

## 2019-02-25 DIAGNOSIS — N182 Chronic kidney disease, stage 2 (mild): Secondary | ICD-10-CM

## 2019-02-25 NOTE — Chronic Care Management (AMB) (Signed)
Care Management Note   [name] is a 73 y.o. year old female who is a primary care patient of Glendale Chard, MD . The CM team was consulted for assistance with care coordination.   Review of patient status, including review of consultants reports, rand collaboration with appropriate care team members and the patient's provider was performed as part of comprehensive patient evaluation and provision of care management services. Telephone outreach to patient today to introduce CM services.   SDOH (Social Determinants of Health) screening performed today: None. See Care Plan for related entries.   Outpatient Encounter Medications as of 02/25/2019  Medication Sig  . albuterol (PROVENTIL HFA;VENTOLIN HFA) 108 (90 Base) MCG/ACT inhaler Inhale 2 puffs into the lungs every 6 (six) hours as needed for wheezing or shortness of breath.  [name] amLODipine (NORVASC) 5 MG tablet   . aspirin 81 MG tablet Take 81 mg by mouth daily.  . brimonidine (ALPHAGAN P) 0.1 % SOLN Place 1 drop into both eyes every 8 (eight) hours.  . calcium gluconate 500 MG tablet Take 1 tablet by mouth daily.   . cilostazol (PLETAL) 100 MG tablet Take 100 mg by mouth daily.   [name] ELDERBERRY PO Take by mouth. 1 teaspoon 3 times per day  . furosemide (LASIX) 20 MG tablet TAKE 1 TABLET EVERY DAY  . hydrALAZINE (APRESOLINE) 10 MG tablet Take 1 tablet (10 mg total) by mouth every 8 (eight) hours. (Patient taking differently: Take 10 mg by mouth 3 (three) times daily. )  . isosorbide mononitrate (IMDUR) 60 MG 24 hr tablet Take 1 tablet (60 mg total) by mouth daily.  [name] levothyroxine (SYNTHROID) 125 MCG tablet TAKE 1 TABLET DAILY ON MONDAY THRU FRIDAY  . Magnesium 250 MG TABS Take 1 tablet (250 mg total) by mouth at bedtime.  . Multiple Vitamins-Minerals (MULTIVITAMIN WITH MINERALS) tablet Take 1 tablet by mouth daily.  . nitroGLYCERIN (NITROSTAT) 0.4 MG SL tablet Place 1 tablet (0.4 mg total) under the tongue every 5 (five) minutes x 3  doses as needed for chest pain.  [name] olmesartan (BENICAR) 40 MG tablet TAKE 1 TABLET EVERY DAY  . OZEMPIC 0.25 or 0.5 MG/DOSE SOPN INJECT 0.5MG INTO SKIN EVERY WEEK, IN THE ABDOMEN, THIGHS, OR UPPER ARM ROTATING INJECTION SITES  . potassium chloride (K-DUR) 10 MEQ tablet Take 10 mEq by mouth once.    No facility-administered encounter medications on file as of 02/25/2019.     I reached out to [name] by phone today.   [name] was given information about Chronic Care Management services today including:  1. CCM service includes personalized support from designated clinical staff supervised by her physician, including individualized plan of care and coordination with other care providers 2. 24/7 contact phone numbers for assistance for urgent and routine care needs. 3. Service will only be billed when office clinical staff spend 20 minutes or more in a month to coordinate care. 4. Only one practitioner may furnish and bill the service in a calendar month. 5. The patient may stop CCM services at any time (effective at the end of the month) by phone call to the office staff. 6. The patient will be responsible for cost sharing (co-pay) of up to 20% of the service fee (after annual deductible is met).   Patient agreed to services and verbal consent obtained.  The patient identifies main concern surrounding cost of Ozempic. Collaboration with PharmD Lottie Dawson regarding cost concerns.  Follow Up Plan: No further  SW follow up planned at this time. The patient will receive a call from embedded PharmD over the next 21 days to discuss medication assistance.   Daneen Schick, BSW, CDP Social Worker, Certified Dementia Practitioner [place] [facility] [place] [facility] [phone]

## 2019-02-27 ENCOUNTER — Ambulatory Visit: Payer: Medicare PPO

## 2019-02-27 LAB — HM DIABETES EYE EXAM

## 2019-03-05 ENCOUNTER — Encounter: Payer: Self-pay | Admitting: Internal Medicine

## 2019-03-10 ENCOUNTER — Encounter: Payer: Self-pay | Admitting: Internal Medicine

## 2019-03-11 ENCOUNTER — Ambulatory Visit (INDEPENDENT_AMBULATORY_CARE_PROVIDER_SITE_OTHER): Payer: Medicare PPO | Admitting: Pharmacist

## 2019-03-11 DIAGNOSIS — I10 Essential (primary) hypertension: Secondary | ICD-10-CM | POA: Diagnosis not present

## 2019-03-11 DIAGNOSIS — E7800 Pure hypercholesterolemia, unspecified: Secondary | ICD-10-CM | POA: Diagnosis not present

## 2019-03-11 DIAGNOSIS — N182 Chronic kidney disease, stage 2 (mild): Secondary | ICD-10-CM

## 2019-03-11 DIAGNOSIS — E1122 Type 2 diabetes mellitus with diabetic chronic kidney disease: Secondary | ICD-10-CM | POA: Diagnosis not present

## 2019-03-12 ENCOUNTER — Other Ambulatory Visit: Payer: Self-pay | Admitting: Pharmacy Technician

## 2019-03-12 NOTE — Patient Outreach (Signed)
[name]) Care Management  03/12/2019  [name] 1946/01/29 [id number]                                       Medication Assistance Referral  Referral From: [name] Embedded RPh [name].   Medication/Company: Alphagan P / [employer] Patient application portion:  Mailed Provider application portion: Faxed  to Dr. Dartha [name] Provider address/fax verified via: Office website  Medication/Company: Larna Daughters / Novo Nordisk Patient application portion:  Education officer, museum portion: Faxed  to Dr. Johnnye [name] Provider address/fax verified via: Office website  Medication/Company: Proventil HFA / [employer] Patient application portion:  Education officer, museum portion: Interoffice Mailed to Dr. Johnnye [name] Provider address/fax verified via: Office website   Follow up:  Will follow up with patient in 10-14 business days to confirm application(s) have been received.  [name] [name] [place] Certified Pharmacy Technician [place] [facility] Direct Dial:[phone]

## 2019-03-16 ENCOUNTER — Telehealth: Payer: Self-pay

## 2019-03-16 DIAGNOSIS — E7849 Other hyperlipidemia: Secondary | ICD-10-CM | POA: Diagnosis not present

## 2019-03-16 DIAGNOSIS — E034 Atrophy of thyroid (acquired): Secondary | ICD-10-CM | POA: Diagnosis not present

## 2019-03-16 DIAGNOSIS — I2510 Atherosclerotic heart disease of native coronary artery without angina pectoris: Secondary | ICD-10-CM | POA: Diagnosis not present

## 2019-03-16 DIAGNOSIS — R072 Precordial pain: Secondary | ICD-10-CM | POA: Diagnosis not present

## 2019-03-18 NOTE — Patient Instructions (Signed)
Visit Information  Goals Addressed            This Visit's Progress     Patient Stated   . I would like to apply for medication assistance for my diabetes medications (pt-stated)       Current Barriers:  . Financial Barriers in complicated patient with multiple medical conditions including T2DM, HTN, HLD; patient has [employer] and reports copay for inhaler, Ozempci & eye drops are cost prohibitive at this time  Pharmacist Clinical Goal(s):  [name] Over the next 30 days, patient will work with PharmD and providers to relieve medication access concerns  Interventions: . Comprehensive medication review completed; medication list updated in electronic medical record.  . Patient meets income/No out of pocket spend criteria for this medication's patient assistance program. Reviewed application process. Patient will provide proof of income, out of pocket spend report, and will sign application. Will collaborate with primary care provider, Dr. Glendale Chard, for their portion of application. Once completed, will submit to [employer], [place], [employer] patient assistance program. . Applications prepared and mailed to patient.  Will follow  Patient Self Care Activities:  . Patient will provide necessary portions of application   Initial goal documentation     . I would like to optimize my diabetes management (pt-stated)       Current Barriers:  . Diabetes: H4RD; complicated by chronic medical conditions including HTN, HLD, hypothyroidism, most recent A1c 6.3% . Current antihyperglycemic regimen: Ozempic 0.25mg  weekly o Will apply for PAP Ozempic 2021 o Patient tolerating well-->continue current dose . Denies hypoglycemic symptoms,Denies hyperglycemic symptoms . Current meal patterns: o Breakfast: eggs/protein/coffee o Lunch: sandwiches, incorporates veggie o Supper: chicken/protein, vegetable side, light carb o Snacks: veggies/fruit o Drinks: avoids sugary drinks, mostly intakes  water/coffee/tea . Current exercise: light walking . Current blood glucose readings: 90-110s fasting . Cardiovascular risk reduction: o Current hypertensive regimen: olmesartan, amlodipine, ismo, hydralazine o Current hyperlipidemia regimen: statin intolerant, discusses risk/benefits with patient, no statins at this time o Current antiplatelet regimen: ASA 81  Pharmacist Clinical Goal(s):  [name] Over the next 90 days, patient will work with PharmD and primary care provider to address needs related to optimization of diabetes management.  Interventions: . Comprehensive medication review performed, medication list updated in electronic medical record . Reviewed & discussed the following diabetes-related information with patient: Follow ADA recommended "diabetes-friendly" diet  (reviewed healthy snack/food options) o Discussed GLP-1 injection technique o Reviewed medication purpose/side effects-->patient denies adverse event  Patient Self Care Activities:  . Patient will check blood glucose 3x weekly in the AM , document, and provide at future appointments . Patient will focus on medication adherence . Patient will take medications as prescribed . Patient will contact provider with any episodes of hypoglycemia . Patient will report any questions or concerns to provider   Initial goal documentation        The patient verbalized understanding of instructions provided today and declined a print copy of patient instruction materials.   The care management team will reach out to the patient again over the next 30 days.   SIGNATURE Regina Eck, PharmD, BCPS Clinical Pharmacist, [facility] [place]: [phone]

## 2019-03-18 NOTE — Progress Notes (Signed)
Chronic Care Management   Visit Note  03/11/2019 Name: [name] MRN: [id number] DOB: [birth year]  Referred by: Catherine Peng, MD Reason for referral : Chronic Care Management   [name] is a 73 y.o. year old female who is a primary care patient of Catherine Peng, MD. The CCM team was consulted for assistance with chronic disease management and care coordination needs related to HTN, HLD and DMII  Review of patient status, including review of consultants reports, relevant laboratory and other test results, and collaboration with appropriate care team members and the patient's provider was performed as part of comprehensive patient evaluation and provision of chronic care management services.    I spoke with [name] by telephone today.  Medications: Outpatient Encounter Medications as of 03/11/2019  Medication Sig  . albuterol (PROVENTIL HFA;VENTOLIN HFA) 108 (90 Base) MCG/ACT inhaler Inhale 2 puffs into the lungs every 6 (six) hours as needed for wheezing or shortness of breath.  [name] amLODipine (NORVASC) 5 MG tablet   . aspirin 81 MG tablet Take 81 mg by mouth daily.  . brimonidine (ALPHAGAN P) 0.1 % SOLN Place 1 drop into both eyes every 8 (eight) hours.  . calcium gluconate 500 MG tablet Take 1 tablet by mouth daily.   . cilostazol (PLETAL) 100 MG tablet Take 100 mg by mouth daily.   [name] ELDERBERRY PO Take by mouth. 1 teaspoon 3 times per day  . furosemide (LASIX) 20 MG tablet TAKE 1 TABLET EVERY DAY  . hydrALAZINE (APRESOLINE) 10 MG tablet Take 1 tablet (10 mg total) by mouth every 8 (eight) hours. (Patient taking differently: Take 10 mg by mouth 3 (three) times daily. )  . isosorbide mononitrate (IMDUR) 60 MG 24 hr tablet Take 1 tablet (60 mg total) by mouth daily.  [name] levothyroxine (SYNTHROID) 125 MCG tablet TAKE 1 TABLET DAILY ON MONDAY THRU FRIDAY  . Magnesium 250 MG TABS Take 1 tablet (250 mg total) by mouth at bedtime.  . Multiple Vitamins-Minerals  (MULTIVITAMIN WITH MINERALS) tablet Take 1 tablet by mouth daily.  . nitroGLYCERIN (NITROSTAT) 0.4 MG SL tablet Place 1 tablet (0.4 mg total) under the tongue every 5 (five) minutes x 3 doses as needed for chest pain.  [name] olmesartan (BENICAR) 40 MG tablet TAKE 1 TABLET EVERY DAY  . OZEMPIC 0.25 or 0.5 MG/DOSE SOPN INJECT 0.5MG  INTO SKIN EVERY WEEK, IN THE ABDOMEN, THIGHS, OR UPPER ARM ROTATING INJECTION SITES  . potassium chloride (K-DUR) 10 MEQ tablet Take 10 mEq by mouth once.    No facility-administered encounter medications on file as of 03/11/2019.     Objective:   Goals Addressed            This Visit's Progress     Patient Stated   . I would like to apply for medication assistance for my diabetes medications (pt-stated)       Current Barriers:  . Financial Barriers in complicated patient with multiple medical conditions including T2DM, HTN, HLD; patient has [employer] and reports copay for inhaler, Ozempci & eye drops are cost prohibitive at this time  Pharmacist Clinical Goal(s):  [name] Over the next 30 days, patient will work with PharmD and providers to relieve medication access concerns  Interventions: . Comprehensive medication review completed; medication list updated in electronic medical record.  . Patient meets income/No out of pocket spend criteria for this medication's patient assistance program. Reviewed application process. Patient will provide proof of income, out of pocket spend report,  and will sign application. Will collaborate with primary care provider, Dr. Glendale [name], for their portion of application. Once completed, will submit to [name], [name], [employer] patient assistance program. . Applications prepared and mailed to patient.  Will follow  Patient Self Care Activities:  . Patient will provide necessary portions of application   Initial goal documentation     . I would like to optimize my diabetes management (pt-stated)       Current  Barriers:  . Diabetes: O5DG; complicated by chronic medical conditions including HTN, HLD, hypothyroidism, most recent A1c 6.3% . Current antihyperglycemic regimen: Ozempic 0.25mg  weekly o Will apply for PAP Ozempic 2021 o Patient tolerating well-->continue current dose . Denies hypoglycemic symptoms,Denies hyperglycemic symptoms . Current meal patterns: o Breakfast: eggs/protein/coffee o Lunch: sandwiches, incorporates veggie o Supper: chicken/protein, vegetable side, light carb o Snacks: veggies/fruit o Drinks: avoids sugary drinks, mostly intakes water/coffee/tea . Current exercise: light walking . Current blood glucose readings: 90-110s fasting . Cardiovascular risk reduction: o Current hypertensive regimen: olmesartan, amlodipine, ismo, hydralazine o Current hyperlipidemia regimen: statin intolerant, discusses risk/benefits with patient, no statins at this time o Current antiplatelet regimen: ASA 81  Pharmacist Clinical Goal(s):  [name] Over the next 90 days, patient will work with PharmD and primary care provider to address needs related to optimization of diabetes management.  Interventions: . Comprehensive medication review performed, medication list updated in electronic medical record . Reviewed & discussed the following diabetes-related information with patient: Follow ADA recommended "diabetes-friendly" diet  (reviewed healthy snack/food options) o Discussed GLP-1 injection technique o Reviewed medication purpose/side effects-->patient denies adverse event  Patient Self Care Activities:  . Patient will check blood glucose 3x weekly in the AM , document, and provide at future appointments . Patient will focus on medication adherence . Patient will take medications as prescribed . Patient will contact provider with any episodes of hypoglycemia . Patient will report any questions or concerns to provider   Initial goal documentation        Plan:   The care management  team will reach out to the patient again over the next 30 days.   Provider Signature Regina Eck, PharmD, BCPS Clinical Pharmacist, [facility] [place]: [phone]

## 2019-03-24 ENCOUNTER — Other Ambulatory Visit: Payer: Self-pay | Admitting: Pharmacy Technician

## 2019-03-24 NOTE — Patient Outreach (Signed)
[employer] [facility]) Care Management  03/24/2019  [name] 10/31/1945 [id number]   Incoming call from patient stating she received patient assistance applications. Reviewed with patient about documents needed for application. Patient states that she will mail them back in as soon as possible.  Will submit to companies once all documents have been received.  Maud Deed Chana Bode [place] Certified Pharmacy Technician [place] Management Direct Dial:[phone]

## 2019-03-30 ENCOUNTER — Ambulatory Visit (INDEPENDENT_AMBULATORY_CARE_PROVIDER_SITE_OTHER): Payer: Medicare PPO | Admitting: Pharmacist

## 2019-03-30 ENCOUNTER — Other Ambulatory Visit: Payer: Self-pay | Admitting: Pharmacy Technician

## 2019-03-30 DIAGNOSIS — E1122 Type 2 diabetes mellitus with diabetic chronic kidney disease: Secondary | ICD-10-CM

## 2019-03-30 DIAGNOSIS — I10 Essential (primary) hypertension: Secondary | ICD-10-CM

## 2019-03-30 DIAGNOSIS — N182 Chronic kidney disease, stage 2 (mild): Secondary | ICD-10-CM | POA: Diagnosis not present

## 2019-03-30 NOTE — Patient Outreach (Signed)
[facility]) Care Management  03/30/2019  [name] 1945/07/08 [id number]   Received patient portion(s) of patient assistance application(s) for Ozempic, Proventil HFA and Alphagan P . Faxed completed application and required documents into [employer] for [employer].  Refaxed provider of Alphagan application to Dr. Harlon Flor for completion.  Will submit Proventil HFA application to [employer] once provider portion has been received from Dr. Allyne Gee office.  Will follow up with Novo Nordisk in 10-14 business days to check status of Ozempic application.  [name] [name] CPhT Certified Pharmacy Technician [facility] Care Management Direct Dial:[phone]

## 2019-04-02 ENCOUNTER — Other Ambulatory Visit: Payer: Self-pay | Admitting: Pharmacy Technician

## 2019-04-02 ENCOUNTER — Telehealth: Payer: Self-pay

## 2019-04-02 NOTE — Patient Outreach (Signed)
[facility]) Care Management  04/02/2019  [name] 1945-04-28 [id number]   Received provider portion(s) of patient assistance application(s) for Proventil HFA. Prepared to mail completed application and required documents into [employer].  Will follow up with company(ies) in 14-21 business days to check status of application(s).  [name] [name] CPhT Certified Pharmacy Technician [facility] Care Management Direct [phone]

## 2019-04-02 NOTE — Progress Notes (Signed)
Chronic Care Management   Visit Note  03/30/2019 Name: Mily Malecki MRN: [id number] DOB: September 30, 1945  Referred by: [name], MD Reason for referral : Chronic Care Management   [name] is a 74 y.o. year old female who is a primary care patient of [name], MD. The CCM team was consulted for assistance with chronic disease management and care coordination needs related to DMII  Review of patient status, including review of consultants reports, relevant laboratory and other test results, and collaboration with appropriate care team members and the patient's provider was performed as part of comprehensive patient evaluation and provision of chronic care management services.     Medications: Outpatient Encounter Medications as of 03/30/2019  Medication Sig  . albuterol (PROVENTIL HFA;VENTOLIN HFA) 108 (90 Base) MCG/ACT inhaler Inhale 2 puffs into the lungs every 6 (six) hours as needed for wheezing or shortness of breath.  [name] amLODipine (NORVASC) 5 MG tablet   . aspirin 81 MG tablet Take 81 mg by mouth daily.  . brimonidine (ALPHAGAN P) 0.1 % SOLN Place 1 drop into both eyes every 8 (eight) hours.  . calcium gluconate 500 MG tablet Take 1 tablet by mouth daily.   . cilostazol (PLETAL) 100 MG tablet Take 100 mg by mouth daily.   [name] ELDERBERRY PO Take by mouth. 1 teaspoon 3 times per day  . furosemide (LASIX) 20 MG tablet TAKE 1 TABLET EVERY DAY  . hydrALAZINE (APRESOLINE) 10 MG tablet Take 1 tablet (10 mg total) by mouth every 8 (eight) hours. (Patient taking differently: Take 10 mg by mouth 3 (three) times daily. )  . isosorbide mononitrate (IMDUR) 60 MG 24 hr tablet Take 1 tablet (60 mg total) by mouth daily.  [name] levothyroxine (SYNTHROID) 125 MCG tablet TAKE 1 TABLET DAILY ON MONDAY THRU FRIDAY  . Magnesium 250 MG TABS Take 1 tablet (250 mg total) by mouth at bedtime.  . Multiple Vitamins-Minerals (MULTIVITAMIN WITH MINERALS) tablet Take 1 tablet by mouth daily.  .  nitroGLYCERIN (NITROSTAT) 0.4 MG SL tablet Place 1 tablet (0.4 mg total) under the tongue every 5 (five) minutes x 3 doses as needed for chest pain.  [name] olmesartan (BENICAR) 40 MG tablet TAKE 1 TABLET EVERY DAY  . OZEMPIC 0.25 or 0.5 MG/DOSE SOPN INJECT 0.5MG  INTO SKIN EVERY WEEK, IN THE ABDOMEN, THIGHS, OR UPPER ARM ROTATING INJECTION SITES  . potassium chloride (K-DUR) 10 MEQ tablet Take 10 mEq by mouth once.    No facility-administered encounter medications on file as of 03/30/2019.     Objective:   Goals Addressed            This Visit's Progress     Patient Stated   . I would like to apply for medication assistance for my diabetes medications (pt-stated)       Current Barriers:  . Financial Barriers in complicated patient with multiple medical conditions including T2DM, HTN, HLD; patient has [employer] and reports copay for inhaler, Ozempci & eye drops are cost prohibitive at this time  Pharmacist Clinical Goal(s):  [name] Over the next 30 days, patient will work with PharmD and providers to relieve medication access concerns  Interventions: . Comprehensive medication review completed; medication list updated in electronic medical record.  . Patient meets income/No out of pocket spend criteria for this medication's patient assistance program. Reviewed application process. Patient will provide proof of income, out of pocket spend report, and will sign application. Will collaborate with primary care provider, Dr.  Glendale Chard, for their portion of application. Once completed, will submit to [employer], [place], [employer] patient assistance program. . Applications submitted on 03/30/2019. Will follow  Patient Self Care Activities:  . Patient will provide necessary portions of application   Please see past updates related to this goal by clicking on the "Past Updates" button in the selected goal      . I would like to optimize my diabetes management (pt-stated)       Current  Barriers:  . Diabetes: I4PY; complicated by chronic medical conditions including HTN, HLD, hypothyroidism, most recent A1c 6.3% on 02/12/2020 . Current antihyperglycemic regimen: Ozempic 0.25mg  weekly o Will apply for PAP Ozempic 2021-mailed to patient o Patient tolerating well-->continue current dose . Denies hypoglycemic symptoms,Denies hyperglycemic symptoms . Current meal patterns: o Breakfast: eggs/protein/coffee o Lunch: sandwiches, incorporates veggie o Supper: chicken/protein, vegetable side, light carb o Snacks: veggies/fruit o Drinks: avoids sugary drinks, mostly intakes water/coffee/tea . Current exercise: light walking . Current blood glucose readings: 90-110s fasting . Cardiovascular risk reduction: o Current hypertensive regimen: olmesartan, amlodipine, ismo, hydralazine o Current hyperlipidemia regimen: statin intolerant, discusses risk/benefits with patient, no statins at this time o Current antiplatelet regimen: ASA 81  Pharmacist Clinical Goal(s):  [name] Over the next 90 days, patient will work with PharmD and primary care provider to address needs related to optimization of diabetes management.  Interventions: . Comprehensive medication review performed, medication list updated in electronic medical record . Reviewed & discussed the following diabetes-related information with patient: Follow ADA recommended "diabetes-friendly" diet  (reviewed healthy snack/food options) o Discussed GLP-1 injection technique o Reviewed medication purpose/side effects-->patient denies adverse event  Patient Self Care Activities:  . Patient will check blood glucose 3x weekly in the AM , document, and provide at future appointments . Patient will focus on medication adherence . Patient will take medications as prescribed . Patient will contact provider with any episodes of hypoglycemia . Patient will report any questions or concerns to provider   Please see past updates related to this  goal by clicking on the "Past Updates" button in the selected goal          Plan:   The care management team will reach out to the patient again over the next 45 days.   Provider Signature Regina Eck, PharmD, BCPS Clinical Pharmacist, [facility] [place]: [phone]

## 2019-04-02 NOTE — Patient Instructions (Signed)
Visit Information  Goals Addressed            This Visit's Progress     Patient Stated   . I would like to apply for medication assistance for my diabetes medications (pt-stated)       Current Barriers:  . Financial Barriers in complicated patient with multiple medical conditions including T2DM, HTN, HLD; patient has [employer] and reports copay for inhaler, Ozempci & eye drops are cost prohibitive at this time  Pharmacist Clinical Goal(s):  [name] Over the next 30 days, patient will work with PharmD and providers to relieve medication access concerns  Interventions: . Comprehensive medication review completed; medication list updated in electronic medical record.  . Patient meets income/No out of pocket spend criteria for this medication's patient assistance program. Reviewed application process. Patient will provide proof of income, out of pocket spend report, and will sign application. Will collaborate with primary care provider, Dr. [name], for their portion of application. Once completed, will submit to [employer], [employer], [employer] patient assistance program. . Applications submitted on 03/30/2019. Will follow  Patient Self Care Activities:  . Patient will provide necessary portions of application   Please see past updates related to this goal by clicking on the "Past Updates" button in the selected goal      . I would like to optimize my diabetes management (pt-stated)       Current Barriers:  . Diabetes: T2DM; complicated by chronic medical conditions including HTN, HLD, hypothyroidism, most recent A1c 6.3% on 02/12/2020 . Current antihyperglycemic regimen: Ozempic 0.25mg  weekly o Will apply for PAP Ozempic 2021-mailed to patient o Patient tolerating well-->continue current dose . Denies hypoglycemic symptoms,Denies hyperglycemic symptoms . Current meal patterns: o Breakfast: eggs/protein/coffee o Lunch: sandwiches, incorporates veggie o Supper: chicken/protein,  vegetable side, light carb o Snacks: veggies/fruit o Drinks: avoids sugary drinks, mostly intakes water/coffee/tea . Current exercise: light walking . Current blood glucose readings: 90-110s fasting . Cardiovascular risk reduction: o Current hypertensive regimen: olmesartan, amlodipine, ismo, hydralazine o Current hyperlipidemia regimen: statin intolerant, discusses risk/benefits with patient, no statins at this time o Current antiplatelet regimen: ASA 81  Pharmacist Clinical Goal(s):  [name] Over the next 90 days, patient will work with PharmD and primary care provider to address needs related to optimization of diabetes management.  Interventions: . Comprehensive medication review performed, medication list updated in electronic medical record . Reviewed & discussed the following diabetes-related information with patient: Follow ADA recommended "diabetes-friendly" diet  (reviewed healthy snack/food options) o Discussed GLP-1 injection technique o Reviewed medication purpose/side effects-->patient denies adverse event  Patient Self Care Activities:  . Patient will check blood glucose 3x weekly in the AM , document, and provide at future appointments . Patient will focus on medication adherence . Patient will take medications as prescribed . Patient will contact provider with any episodes of hypoglycemia . Patient will report any questions or concerns to provider   Please see past updates related to this goal by clicking on the "Past Updates" button in the selected goal         The patient verbalized understanding of instructions provided today and declined a print copy of patient instruction materials.   The care management team will reach out to the patient again over the next 45 days.   SIGNATURE Kieth Brightly, PharmD, BCPS Clinical Pharmacist, [facility]  [facility]  Direct Dial: [phone]

## 2019-04-03 DIAGNOSIS — Z794 Long term (current) use of insulin: Secondary | ICD-10-CM | POA: Diagnosis not present

## 2019-04-03 DIAGNOSIS — E119 Type 2 diabetes mellitus without complications: Secondary | ICD-10-CM | POA: Diagnosis not present

## 2019-04-03 DIAGNOSIS — E1165 Type 2 diabetes mellitus with hyperglycemia: Secondary | ICD-10-CM | POA: Diagnosis not present

## 2019-04-06 ENCOUNTER — Ambulatory Visit: Payer: Self-pay | Admitting: Pharmacist

## 2019-04-06 ENCOUNTER — Encounter: Payer: Self-pay | Admitting: Internal Medicine

## 2019-04-06 NOTE — Progress Notes (Signed)
  Chronic Care Management   Outreach Note  04/06/2019 Name: [name] MRN: [id number] DOB: 1945/05/18  Referred by: [name], MD Reason for referral : Chronic Care Management   An unsuccessful telephone outreach was attempted today. The patient was referred to the case management team by for assistance with care management and care coordination.   Follow Up Plan: A HIPPA compliant phone message was left for the patient providing contact information and requesting a return call.  The care management team will reach out to the patient again over the next 30 days.   SIGNATURE Kieth Brightly, PharmD, BCPS Clinical Pharmacist, [facility]  [facility]  Direct Dial: [phone]

## 2019-04-13 ENCOUNTER — Other Ambulatory Visit: Payer: Self-pay

## 2019-04-13 MED ORDER — MAGNESIUM 250 MG PO TABS: 250.0000 mg | ORAL_TABLET | Freq: Every day | ORAL | 2 refills | Status: DC

## 2019-04-13 MED ORDER — FUROSEMIDE 20 MG PO TABS: 20.0000 mg | ORAL_TABLET | Freq: Every day | ORAL | 2 refills | Status: DC

## 2019-04-13 MED ORDER — ISOSORBIDE MONONITRATE ER 60 MG PO TB24: 60.0000 mg | ORAL_TABLET | Freq: Every day | ORAL | 1 refills | Status: DC

## 2019-04-14 ENCOUNTER — Ambulatory Visit: Payer: Self-pay | Admitting: Pharmacist

## 2019-04-14 DIAGNOSIS — E1122 Type 2 diabetes mellitus with diabetic chronic kidney disease: Secondary | ICD-10-CM

## 2019-04-14 NOTE — Progress Notes (Signed)
Chronic Care Management   Visit Note  04/14/2019 Name: [name] MRN: [id number] DOB: 1946/02/02  Referred by: Catherine Peng, MD Reason for referral : No chief complaint on file.   [name] is a 74 y.o. year old female who is a primary care patient of Catherine Peng, MD. The CCM team was consulted for assistance with chronic disease management and care coordination needs related to HTN and DMII  Review of patient status, including review of consultants reports, relevant laboratory and other test results, and collaboration with appropriate care team members and the patient's provider was performed as part of comprehensive patient evaluation and provision of chronic care management services.    I spoke with [name] by telephone today  Medications: Outpatient Encounter Medications as of 04/14/2019  Medication Sig  . albuterol (PROVENTIL HFA;VENTOLIN HFA) 108 (90 Base) MCG/ACT inhaler Inhale 2 puffs into the lungs every 6 (six) hours as needed for wheezing or shortness of breath.  [name] amLODipine (NORVASC) 5 MG tablet   . aspirin 81 MG tablet Take 81 mg by mouth daily.  . brimonidine (ALPHAGAN P) 0.1 % SOLN Place 1 drop into both eyes every 8 (eight) hours.  . calcium gluconate 500 MG tablet Take 1 tablet by mouth daily.   . cilostazol (PLETAL) 100 MG tablet Take 100 mg by mouth daily.   [name] ELDERBERRY PO Take by mouth. 1 teaspoon 3 times per day  . furosemide (LASIX) 20 MG tablet Take 1 tablet (20 mg total) by mouth daily.  . hydrALAZINE (APRESOLINE) 10 MG tablet Take 1 tablet (10 mg total) by mouth every 8 (eight) hours. (Patient taking differently: Take 10 mg by mouth 3 (three) times daily. )  . isosorbide mononitrate (IMDUR) 60 MG 24 hr tablet Take 1 tablet (60 mg total) by mouth daily.  [name] levothyroxine (SYNTHROID) 125 MCG tablet TAKE 1 TABLET DAILY ON MONDAY THRU FRIDAY  . Magnesium 250 MG TABS Take 1 tablet (250 mg total) by mouth at bedtime.  . Multiple  Vitamins-Minerals (MULTIVITAMIN WITH MINERALS) tablet Take 1 tablet by mouth daily.  . nitroGLYCERIN (NITROSTAT) 0.4 MG SL tablet Place 1 tablet (0.4 mg total) under the tongue every 5 (five) minutes x 3 doses as needed for chest pain.  [name] olmesartan (BENICAR) 40 MG tablet TAKE 1 TABLET EVERY DAY  . OZEMPIC 0.25 or 0.5 MG/DOSE SOPN INJECT 0.5MG  INTO SKIN EVERY WEEK, IN THE ABDOMEN, THIGHS, OR UPPER ARM ROTATING INJECTION SITES  . potassium chloride (K-DUR) 10 MEQ tablet Take 10 mEq by mouth once.    No facility-administered encounter medications on file as of 04/14/2019.     Objective:   Goals Addressed            This Visit's Progress     Patient Stated   . I would like to apply for medication assistance for my diabetes medications (pt-stated)       Current Barriers:  . Financial Barriers in complicated patient with multiple medical conditions including T2DM, HTN, HLD; patient has [employer] and reports copay for inhaler, Ozempci & eye drops are cost prohibitive at this time  Pharmacist Clinical Goal(s):  [name] Over the next 30 days, patient will work with PharmD and providers to relieve medication access concerns  Interventions: . Comprehensive medication review completed; medication list updated in electronic medical record.  . Patient meets income/No out of pocket spend criteria for this medication's patient assistance program. Reviewed application process. Patient will provide proof of income,  out of pocket spend report, and will sign application. Will collaborate with primary care provider, [name], for their portion of application. Once completed, will submit to [employer], [employer], [employer] patient assistance program. . Applications submitted on 03/30/2019. Will follow . Patient call concerning application.  Will follow up by Friday to check on approval status  Patient Self Care Activities:  . Patient will provide necessary portions of application   Please see past  updates related to this goal by clicking on the "Past Updates" button in the selected goal      . I would like to optimize my diabetes management (pt-stated)       Current Barriers:  . Diabetes: S1XB; complicated by chronic medical conditions including HTN, HLD, hypothyroidism, most recent A1c 6.3% on 02/12/2020 . Current antihyperglycemic regimen: Ozempic 0.25mg  weekly o Will apply for PAP Ozempic 2021-mailed to patient o Patient tolerating well-->continue current dose . Denies hypoglycemic symptoms,Denies hyperglycemic symptoms . Current meal patterns: o Breakfast: eggs/protein/coffee o Lunch: sandwiches, incorporates veggie o Supper: chicken/protein, vegetable side, light carb o Snacks: veggies/fruit o Drinks: avoids sugary drinks, mostly intakes water/coffee/tea . Current exercise: light walking . Current blood glucose readings: 90-110s fasting . Cardiovascular risk reduction: o Current hypertensive regimen: olmesartan, amlodipine, ismo, hydralazine (encouraged patient to call in refills today, goal BP <130/80) o Current hyperlipidemia regimen: statin intolerant, discusses risk/benefits with patient, no statins at this time o Current antiplatelet regimen: ASA 81  Pharmacist Clinical Goal(s):  [name] Over the next 90 days, patient will work with PharmD and primary care provider to address needs related to optimization of diabetes management.  Interventions: . Comprehensive medication review performed, medication list updated in electronic medical record . Reviewed & discussed the following diabetes-related information with patient: Follow ADA recommended "diabetes-friendly" diet  (reviewed healthy snack/food options) o Discussed GLP-1 injection technique o Reviewed medication purpose/side effects-->patient denies adverse event  Patient Self Care Activities:  . Patient will check blood glucose 3x weekly in the AM , document, and provide at future appointments . Patient will focus on  medication adherence . Patient will take medications as prescribed . Patient will contact provider with any episodes of hypoglycemia . Patient will report any questions or concerns to provider   Please see past updates related to this goal by clicking on the "Past Updates" button in the selected goal        Plan:   The care management team will reach out to the patient again over the next 30 days.   Provider Signature Regina Eck, PharmD, BCPS Clinical Pharmacist, [facility] [place]: [phone]

## 2019-04-14 NOTE — Patient Instructions (Signed)
Visit Information  Goals Addressed            This Visit's Progress     Patient Stated   . I would like to apply for medication assistance for my diabetes medications (pt-stated)       Current Barriers:  . Financial Barriers in complicated patient with multiple medical conditions including T2DM, HTN, HLD; patient has [employer] and reports copay for inhaler, Ozempci & eye drops are cost prohibitive at this time  Pharmacist Clinical Goal(s):  [name] Over the next 30 days, patient will work with PharmD and providers to relieve medication access concerns  Interventions: . Comprehensive medication review completed; medication list updated in electronic medical record.  . Patient meets income/No out of pocket spend criteria for this medication's patient assistance program. Reviewed application process. Patient will provide proof of income, out of pocket spend report, and will sign application. Will collaborate with primary care provider, Dr. [name], for their portion of application. Once completed, will submit to [employer], [employer], [employer] patient assistance program. . Applications submitted on 03/30/2019. Will follow . Patient call concerning application.  Will follow up by Friday to check on approval status  Patient Self Care Activities:  . Patient will provide necessary portions of application   Please see past updates related to this goal by clicking on the "Past Updates" button in the selected goal      . I would like to optimize my diabetes management (pt-stated)       Current Barriers:  . Diabetes: T2DM; complicated by chronic medical conditions including HTN, HLD, hypothyroidism, most recent A1c 6.3% on 02/12/2020 . Current antihyperglycemic regimen: Ozempic 0.25mg  weekly o Will apply for PAP Ozempic 2021-mailed to patient o Patient tolerating well-->continue current dose . Denies hypoglycemic symptoms,Denies hyperglycemic symptoms . Current meal  patterns: o Breakfast: eggs/protein/coffee o Lunch: sandwiches, incorporates veggie o Supper: chicken/protein, vegetable side, light carb o Snacks: veggies/fruit o Drinks: avoids sugary drinks, mostly intakes water/coffee/tea . Current exercise: light walking . Current blood glucose readings: 90-110s fasting . Cardiovascular risk reduction: o Current hypertensive regimen: olmesartan, amlodipine, ismo, hydralazine (encouraged patient to call in refills today, goal BP <130/80) o Current hyperlipidemia regimen: statin intolerant, discusses risk/benefits with patient, no statins at this time o Current antiplatelet regimen: ASA 81  Pharmacist Clinical Goal(s):  [name] Over the next 90 days, patient will work with PharmD and primary care provider to address needs related to optimization of diabetes management.  Interventions: . Comprehensive medication review performed, medication list updated in electronic medical record . Reviewed & discussed the following diabetes-related information with patient: Follow ADA recommended "diabetes-friendly" diet  (reviewed healthy snack/food options) o Discussed GLP-1 injection technique o Reviewed medication purpose/side effects-->patient denies adverse event  Patient Self Care Activities:  . Patient will check blood glucose 3x weekly in the AM , document, and provide at future appointments . Patient will focus on medication adherence . Patient will take medications as prescribed . Patient will contact provider with any episodes of hypoglycemia . Patient will report any questions or concerns to provider   Please see past updates related to this goal by clicking on the "Past Updates" button in the selected goal         The patient verbalized understanding of instructions provided today and declined a print copy of patient instruction materials.   The care management team will reach out to the patient again over the next 30 days.   SIGNATURE Kieth Brightly, PharmD,  BCPS Clinical Pharmacist, [place]: [phone]

## 2019-04-15 ENCOUNTER — Telehealth: Payer: Self-pay

## 2019-04-17 ENCOUNTER — Telehealth: Payer: Self-pay

## 2019-04-20 ENCOUNTER — Other Ambulatory Visit: Payer: Self-pay

## 2019-04-20 MED ORDER — ISOSORBIDE MONONITRATE ER 60 MG PO TB24: 60.0000 mg | ORAL_TABLET | Freq: Every day | ORAL | 1 refills | Status: DC

## 2019-04-20 MED ORDER — FUROSEMIDE 20 MG PO TABS: 20.0000 mg | ORAL_TABLET | Freq: Every day | ORAL | 2 refills | Status: DC

## 2019-04-20 MED ORDER — MAGNESIUM 250 MG PO TABS: 250.0000 mg | ORAL_TABLET | Freq: Every day | ORAL | 2 refills | Status: DC

## 2019-05-04 ENCOUNTER — Ambulatory Visit (INDEPENDENT_AMBULATORY_CARE_PROVIDER_SITE_OTHER): Payer: Medicare PPO | Admitting: Pharmacist

## 2019-05-04 ENCOUNTER — Encounter: Payer: Self-pay | Admitting: Internal Medicine

## 2019-05-04 DIAGNOSIS — E1122 Type 2 diabetes mellitus with diabetic chronic kidney disease: Secondary | ICD-10-CM

## 2019-05-04 DIAGNOSIS — N182 Chronic kidney disease, stage 2 (mild): Secondary | ICD-10-CM

## 2019-05-06 NOTE — Progress Notes (Signed)
Chronic Care Management   Visit Note  05/04/2019 Name: Crystale Giannattasio MRN: [id number] DOB: [birth year]  Referred by: Glendale Chard, MD Reason for referral : Chronic Care Management (Diabetes)   [name] is a 74 y.o. year old female who is a primary care patient of Glendale Chard, MD. The CCM team was consulted for assistance with chronic disease management and care coordination needs related to DMII  Review of patient status, including review of consultants reports, relevant laboratory and other test results, and collaboration with appropriate care team members and the patient's provider was performed as part of comprehensive patient evaluation and provision of chronic care management services.    Medications: Outpatient Encounter Medications as of 05/04/2019  Medication Sig  . albuterol (PROVENTIL HFA;VENTOLIN HFA) 108 (90 Base) MCG/ACT inhaler Inhale 2 puffs into the lungs every 6 (six) hours as needed for wheezing or shortness of breath.  [name] amLODipine (NORVASC) 5 MG tablet   . aspirin 81 MG tablet Take 81 mg by mouth daily.  . brimonidine (ALPHAGAN P) 0.1 % SOLN Place 1 drop into both eyes every 8 (eight) hours.  . calcium gluconate 500 MG tablet Take 1 tablet by mouth daily.   . cilostazol (PLETAL) 100 MG tablet Take 100 mg by mouth daily.   [name] ELDERBERRY PO Take by mouth. 1 teaspoon 3 times per day  . furosemide (LASIX) 20 MG tablet Take 1 tablet (20 mg total) by mouth daily.  . hydrALAZINE (APRESOLINE) 10 MG tablet Take 1 tablet (10 mg total) by mouth every 8 (eight) hours. (Patient taking differently: Take 10 mg by mouth 3 (three) times daily. )  . isosorbide mononitrate (IMDUR) 60 MG 24 hr tablet Take 1 tablet (60 mg total) by mouth daily.  [name] levothyroxine (SYNTHROID) 125 MCG tablet TAKE 1 TABLET DAILY ON MONDAY THRU FRIDAY  . Magnesium 250 MG TABS Take 1 tablet (250 mg total) by mouth at bedtime.  . Multiple Vitamins-Minerals (MULTIVITAMIN WITH MINERALS) tablet Take  1 tablet by mouth daily.  . nitroGLYCERIN (NITROSTAT) 0.4 MG SL tablet Place 1 tablet (0.4 mg total) under the tongue every 5 (five) minutes x 3 doses as needed for chest pain.  [name] olmesartan (BENICAR) 40 MG tablet TAKE 1 TABLET EVERY DAY  . OZEMPIC 0.25 or 0.5 MG/DOSE SOPN INJECT 0.5MG  INTO SKIN EVERY WEEK, IN THE ABDOMEN, THIGHS, OR UPPER ARM ROTATING INJECTION SITES  . potassium chloride (K-DUR) 10 MEQ tablet Take 10 mEq by mouth once.    No facility-administered encounter medications on file as of 05/04/2019.     Objective:   Goals Addressed            This Visit's Progress     Patient Stated   . I would like to optimize my diabetes management (pt-stated)       Current Barriers:  . Diabetes: I7PO; complicated by chronic medical conditions including HTN, HLD, hypothyroidism, most recent A1c 6.3% on 02/12/2020 . Current antihyperglycemic regimen: Ozempic 0.25mg  weekly o Will apply for PAP Ozempic 2021-submitted to company on 04/29/21 (application pending, samples provided today) o Patient tolerating well-->continue current dose . Denies hypoglycemic symptoms,Denies hyperglycemic symptoms . Current meal patterns: o Breakfast: eggs/protein/coffee o Lunch: sandwiches, incorporates veggie o Supper: chicken/protein, vegetable side, light carb o Snacks: veggies/fruit o Drinks: avoids sugary drinks, mostly intakes water/coffee/tea . Current exercise: light walking . Current blood glucose readings: 90-110s fasting . Cardiovascular risk reduction: o Current hypertensive regimen: olmesartan, amlodipine, ismo, hydralazine (encouraged patient to call in  refills today, goal BP <130/80) o Current hyperlipidemia regimen: statin intolerant, discusses risk/benefits with patient, no statins at this time o Current antiplatelet regimen: ASA 81  Pharmacist Clinical Goal(s):  [name] Over the next 90 days, patient will work with PharmD and primary care provider to address needs related to optimization of  diabetes management.  Interventions: . Comprehensive medication review performed, medication list updated in electronic medical record . Reviewed & discussed the following diabetes-related information with patient: Follow ADA recommended "diabetes-friendly" diet  (reviewed healthy snack/food options) o Discussed GLP-1 injection technique o Reviewed medication purpose/side effects-->patient denies adverse event  Patient Self Care Activities:  . Patient will check blood glucose 3x weekly in the AM , document, and provide at future appointments . Patient will focus on medication adherence . Patient will take medications as prescribed . Patient will contact provider with any episodes of hypoglycemia . Patient will report any questions or concerns to provider   Please see past updates related to this goal by clicking on the "Past Updates" button in the selected goal          Plan:   The care management team will reach out to the patient again over the next 30 days.   Provider Signature Kieth Brightly, PharmD, BCPS Clinical Pharmacist, [facility]  [facility]  Direct Dial: [phone]

## 2019-05-06 NOTE — Patient Instructions (Signed)
Visit Information  Goals Addressed            This Visit's Progress     Patient Stated   . I would like to optimize my diabetes management (pt-stated)       Current Barriers:  . Diabetes: T2DM; complicated by chronic medical conditions including HTN, HLD, hypothyroidism, most recent A1c 6.3% on 02/12/2020 . Current antihyperglycemic regimen: Ozempic 0.25mg  weekly o Will apply for PAP Ozempic 2021-submitted to company on 05/01/19 (application pending, samples provided today) o Patient tolerating well-->continue current dose . Denies hypoglycemic symptoms,Denies hyperglycemic symptoms . Current meal patterns: o Breakfast: eggs/protein/coffee o Lunch: sandwiches, incorporates veggie o Supper: chicken/protein, vegetable side, light carb o Snacks: veggies/fruit o Drinks: avoids sugary drinks, mostly intakes water/coffee/tea . Current exercise: light walking . Current blood glucose readings: 90-110s fasting . Cardiovascular risk reduction: o Current hypertensive regimen: olmesartan, amlodipine, ismo, hydralazine (encouraged patient to call in refills today, goal BP <130/80) o Current hyperlipidemia regimen: statin intolerant, discusses risk/benefits with patient, no statins at this time o Current antiplatelet regimen: ASA 81  Pharmacist Clinical Goal(s):  [name] Over the next 90 days, patient will work with PharmD and primary care provider to address needs related to optimization of diabetes management.  Interventions: . Comprehensive medication review performed, medication list updated in electronic medical record . Reviewed & discussed the following diabetes-related information with patient: Follow ADA recommended "diabetes-friendly" diet  (reviewed healthy snack/food options) o Discussed GLP-1 injection technique o Reviewed medication purpose/side effects-->patient denies adverse event  Patient Self Care Activities:  . Patient will check blood glucose 3x weekly in the AM , document,  and provide at future appointments . Patient will focus on medication adherence . Patient will take medications as prescribed . Patient will contact provider with any episodes of hypoglycemia . Patient will report any questions or concerns to provider   Please see past updates related to this goal by clicking on the "Past Updates" button in the selected goal         The patient verbalized understanding of instructions provided today and declined a print copy of patient instruction materials.   The care management team will reach out to the patient again over the next 30 days.   SIGNATURE Kieth Brightly, PharmD, BCPS Clinical Pharmacist, [facility]  [facility]  Direct Dial: [phone]

## 2019-05-07 ENCOUNTER — Telehealth: Payer: Self-pay | Admitting: Internal Medicine

## 2019-05-07 ENCOUNTER — Other Ambulatory Visit: Payer: Self-pay | Admitting: Pharmacy Technician

## 2019-05-07 NOTE — Telephone Encounter (Signed)
The pt was notified that her Ozempic form the [employer] patient assistance is ready for pickup.

## 2019-05-07 NOTE — Patient Outreach (Signed)
[facility]) Care Management  05/07/2019  [name] 1945/08/05 [id number]   Incoming message from [facility] staff member stating that patient has been approved for Ozempic and that medication arrived to office today.  [name] [name] CPhT Certified Pharmacy Technician [facility] Care Management Direct Dial:[phone]

## 2019-05-13 ENCOUNTER — Other Ambulatory Visit: Payer: Self-pay

## 2019-05-13 ENCOUNTER — Ambulatory Visit: Payer: Medicare PPO | Attending: Internal Medicine

## 2019-05-13 DIAGNOSIS — Z23 Encounter for immunization: Secondary | ICD-10-CM | POA: Insufficient documentation

## 2019-05-13 NOTE — Progress Notes (Signed)
   Covid-19 Vaccination Clinic  Name:  Ardys Hataway    MRN: [id number] DOB: 10/07/1945  05/13/2019  Ms. Asfour was observed post Covid-19 immunization for 15 minutes without incidence. She was provided with Vaccine Information Sheet and instruction to access the V-Safe system.   Ms. Cohea was instructed to call 911 with any severe reactions post vaccine: [name] Difficulty breathing  . Swelling of your face and throat  . A fast heartbeat  . A bad rash all over your body  . Dizziness and weakness    Immunizations Administered    Name Date Dose VIS Date Route   Moderna COVID-19 Vaccine 05/13/2019 10:56 AM 0.5 mL 02/24/2019 Intramuscular   Manufacturer: Moderna   Lot: [id number]   NDC: 74827-078-67

## 2019-05-19 ENCOUNTER — Other Ambulatory Visit: Payer: Self-pay | Admitting: Pharmacy Technician

## 2019-05-19 NOTE — Patient Outreach (Signed)
[facility]) Care Management  05/19/2019  [name] March 09, 1946 [id number]    Follow up call placed to [employer] regarding patient assistance application(s) for Proventil HFA , Desiree confirms application has been received. Attestation form mailed out to patient on 2/23 for completion.  Follow up:  Will route note to [facility] Embedded RPh Kandra Nicolas to inform  [name]. [name] CPhT Certified Pharmacy Technician [facility] Care Management Direct [phone]

## 2019-05-27 ENCOUNTER — Ambulatory Visit (INDEPENDENT_AMBULATORY_CARE_PROVIDER_SITE_OTHER): Payer: Medicare PPO

## 2019-05-27 ENCOUNTER — Other Ambulatory Visit: Payer: Self-pay

## 2019-05-27 ENCOUNTER — Encounter: Payer: Self-pay | Admitting: Internal Medicine

## 2019-05-27 ENCOUNTER — Ambulatory Visit: Payer: Medicare PPO | Admitting: Internal Medicine

## 2019-05-27 VITALS — BP 109/62 | HR 56 | Temp 98.7°F | Ht 61.8 in | Wt 154.6 lb

## 2019-05-27 DIAGNOSIS — Z0000 Encounter for general adult medical examination without abnormal findings: Secondary | ICD-10-CM

## 2019-05-27 DIAGNOSIS — E039 Hypothyroidism, unspecified: Secondary | ICD-10-CM

## 2019-05-27 DIAGNOSIS — N182 Chronic kidney disease, stage 2 (mild): Secondary | ICD-10-CM | POA: Diagnosis not present

## 2019-05-27 DIAGNOSIS — E559 Vitamin D deficiency, unspecified: Secondary | ICD-10-CM | POA: Diagnosis not present

## 2019-05-27 DIAGNOSIS — Z6828 Body mass index (BMI) 28.0-28.9, adult: Secondary | ICD-10-CM | POA: Diagnosis not present

## 2019-05-27 DIAGNOSIS — I129 Hypertensive chronic kidney disease with stage 1 through stage 4 chronic kidney disease, or unspecified chronic kidney disease: Secondary | ICD-10-CM

## 2019-05-27 DIAGNOSIS — E1122 Type 2 diabetes mellitus with diabetic chronic kidney disease: Secondary | ICD-10-CM

## 2019-05-27 DIAGNOSIS — K219 Gastro-esophageal reflux disease without esophagitis: Secondary | ICD-10-CM

## 2019-05-27 DIAGNOSIS — R1310 Dysphagia, unspecified: Secondary | ICD-10-CM

## 2019-05-27 DIAGNOSIS — E663 Overweight: Secondary | ICD-10-CM | POA: Diagnosis not present

## 2019-05-27 LAB — POCT URINALYSIS DIPSTICK
Bilirubin, UA: NEGATIVE
Blood, UA: NEGATIVE
Glucose, UA: NEGATIVE
Ketones, UA: NEGATIVE
Leukocytes, UA: NEGATIVE
Nitrite, UA: NEGATIVE
Protein, UA: NEGATIVE
Spec Grav, UA: 1.02 (ref 1.010–1.025)
Urobilinogen, UA: 0.2 E.U./dL
pH, UA: 5.5 (ref 5.0–8.0)

## 2019-05-27 LAB — POCT UA - MICROALBUMIN
Albumin/Creatinine Ratio, Urine, POC: <30
Creatinine, POC: 100 mg/dL
Microalbumin Ur, POC: 10 mg/L

## 2019-05-27 NOTE — Progress Notes (Signed)
This visit occurred during the SARS-CoV-2 public health emergency.  Safety protocols were in place, including screening questions prior to the visit, additional usage of staff PPE, and extensive cleaning of exam room while observing appropriate contact time as indicated for disinfecting solutions.  Subjective:   [name] is a 74 y.o. female who presents for Medicare Annual (Subsequent) preventive examination.  Review of Systems:  n/a Cardiac Risk Factors include: advanced age (>22men, >65 women);diabetes mellitus;hypertension;sedentary lifestyle     Objective:     Vitals: BP 109/62 (BP Location: Left Arm, Patient Position: Sitting, Cuff Size: Normal)   Pulse (!) 56   Temp 98.7 F (37.1 C) (Oral)   Ht 5' 1.8" (1.57 m)   Wt 154 lb 9.6 oz (70.1 kg)   SpO2 99%   BMI 28.46 kg/m   Body mass index is 28.46 kg/m.  Advanced Directives 05/27/2019 05/21/2018 12/24/2017 12/15/2017 06/12/2017 12/26/2016 07/18/2015  Does Patient Have a Medical Advance Directive? No No No No No No No  Would patient like information on creating a medical advance directive? Yes (MAU/Ambulatory/Procedural Areas - Information given) Yes (MAU/Ambulatory/Procedural Areas - Information given) No - Patient declined No - Patient declined No - Patient declined No - Patient declined No - patient declined information    Tobacco Social History   Tobacco Use  Smoking Status Never Smoker  Smokeless Tobacco Never Used     Counseling given: Not Answered   Clinical Intake:  Pre-visit preparation completed: Yes  Pain : No/denies pain     Nutritional Status: BMI 25 -29 Overweight Nutritional Risks: None Diabetes: No  How often do you need to have someone help you when you read instructions, pamphlets, or other written materials from your doctor or pharmacy?: 1 - Never What is the last grade level you completed in school?: 12th grade  Interpreter Needed?: No  Information entered by :: NAllen LPN  Past  Medical History:  Diagnosis Date  . Benign hypertensive renal disease   . CKD stage 2 due to type 2 diabetes mellitus (HCC)   . Hypertension   . Hypothyroidism   . Obesity   . OSA on CPAP   . Pure hypercholesterolemia   . PVD (peripheral vascular disease) (HCC)   . Vitamin D deficiency    Past Surgical History:  Procedure Laterality Date  . ABDOMINAL HYSTERECTOMY    . BREAST CYST EXCISION Right over 20 yrs  . broken leg and displaced ankle    . DILATION AND CURETTAGE OF UTERUS    . LEFT HEART CATH AND CORONARY ANGIOGRAPHY N/A 06/13/2017   Procedure: LEFT HEART CATH AND CORONARY ANGIOGRAPHY;  Surgeon: Orpah Cobb, MD;  Location: [facility] INVASIVE CV LAB;  Service: Cardiovascular;  Laterality: N/A;  . right and left rotator cuff    . THYROIDECTOMY     Family History  Problem Relation Age of Onset  . Alzheimer's disease Mother   . Aneurysm Father   . Cancer Brother    Social History   Socioeconomic History  . Marital status: Married    Spouse name: Not on file  . Number of children: Not on file  . Years of education: Not on file  . Highest education level: Not on file  Occupational History  . Occupation: retired  Tobacco Use  . Smoking status: Never Smoker  . Smokeless tobacco: Never Used  Substance and Sexual Activity  . Alcohol use: No    Alcohol/week: 0.0 standard drinks    Comment: occasional wine  .  Drug use: No  . Sexual activity: Yes  Other Topics Concern  . Not on file  Social History Narrative   Drinks 1-2 cups daily of coffee daily.   Social Determinants of Health   Financial Resource Strain: Low Risk   . Difficulty of Paying Living Expenses: Not hard at all  Food Insecurity: No Food Insecurity  . Worried About Charity fundraiser in the Last Year: Never true  . Ran Out of Food in the Last Year: Never true  Transportation Needs: No Transportation Needs  . Lack of Transportation (Medical): No  . Lack of Transportation (Non-Medical): No  Physical  Activity: Inactive  . Days of Exercise per Week: 0 days  . Minutes of Exercise per Session: 0 min  Stress: No Stress Concern Present  . Feeling of Stress : Not at all  Social Connections:   . Frequency of Communication with Friends and Family: Not on file  . Frequency of Social Gatherings with Friends and Family: Not on file  . Attends Religious Services: Not on file  . Active Member of Clubs or Organizations: Not on file  . Attends Archivist Meetings: Not on file  . Marital Status: Not on file    Outpatient Encounter Medications as of 05/27/2019  Medication Sig  . albuterol (PROVENTIL HFA;VENTOLIN HFA) 108 (90 Base) MCG/ACT inhaler Inhale 2 puffs into the lungs every 6 (six) hours as needed for wheezing or shortness of breath.  [name] amLODipine (NORVASC) 5 MG tablet   . aspirin 81 MG tablet Take 81 mg by mouth daily.  [name] b complex vitamins tablet Take 1 tablet by mouth daily.  . brimonidine (ALPHAGAN P) 0.1 % SOLN Place 1 drop into both eyes every 8 (eight) hours.  . calcium gluconate 500 MG tablet Take 1 tablet by mouth daily.   . cilostazol (PLETAL) 100 MG tablet Take 100 mg by mouth daily.   . furosemide (LASIX) 20 MG tablet Take 1 tablet (20 mg total) by mouth daily.  . hydrALAZINE (APRESOLINE) 10 MG tablet Take 1 tablet (10 mg total) by mouth every 8 (eight) hours. (Patient taking differently: Take 10 mg by mouth 3 (three) times daily. )  . isosorbide mononitrate (IMDUR) 60 MG 24 hr tablet Take 1 tablet (60 mg total) by mouth daily.  [name] levothyroxine (SYNTHROID) 125 MCG tablet TAKE 1 TABLET DAILY ON MONDAY THRU FRIDAY  . Magnesium 250 MG TABS Take 1 tablet (250 mg total) by mouth at bedtime.  . Multiple Vitamins-Minerals (MULTIVITAMIN WITH MINERALS) tablet Take 1 tablet by mouth daily.  . nitroGLYCERIN (NITROSTAT) 0.4 MG SL tablet Place 1 tablet (0.4 mg total) under the tongue every 5 (five) minutes x 3 doses as needed for chest pain.  [name] olmesartan (BENICAR) 40 MG tablet TAKE 1  TABLET EVERY DAY  . OZEMPIC 0.25 or 0.5 MG/DOSE SOPN INJECT 0.5MG  INTO SKIN EVERY WEEK, IN THE ABDOMEN, THIGHS, OR UPPER ARM ROTATING INJECTION SITES  . potassium chloride (K-DUR) 10 MEQ tablet Take 10 mEq by mouth once.   [name] ELDERBERRY PO Take by mouth. 1 teaspoon 3 times per day   No facility-administered encounter medications on file as of 05/27/2019.    Activities of Daily Living In your present state of health, do you have any difficulty performing the following activities: 05/27/2019  Hearing? N  Vision? N  Difficulty concentrating or making decisions? N  Walking or climbing stairs? N  Dressing or bathing? N  Doing errands, shopping? N  Preparing  Food and eating ? N  Using the Toilet? N  In the past six months, have you accidently leaked urine? Y  Comment wears a pad  Do you have problems with loss of bowel control? N  Managing your Medications? N  Managing your Finances? N  Housekeeping or managing your Housekeeping? N  Some recent data might be hidden    Patient Care Team: [name], MD as PCP - General (Internal Medicine) Chalmers Guest, MD as Consulting Physician (Ophthalmology) [name] as Social Worker [place], Lilla Shook, [facility] (Pharmacist) Regan Rakers, CPhT as [facility] Management (Pharmacy Technician)    Assessment:   This is a routine wellness examination for Ahmiyah.  Exercise Activities and Dietary recommendations Current Exercise Habits: The patient does not participate in regular exercise at present  Goals    . I would like to apply for medication assistance for my diabetes medications (pt-stated)     Current Barriers:  . Financial Barriers in complicated patient with multiple medical conditions including T2DM, HTN, HLD; patient has [employer] and reports copay for inhaler, Ozempci & eye drops are cost prohibitive at this time  Pharmacist Clinical Goal(s):  [name] Over the next 30 days, patient will work with PharmD  and providers to relieve medication access concerns  Interventions: . Comprehensive medication review completed; medication list updated in electronic medical record.  . Patient meets income/No out of pocket spend criteria for this medication's patient assistance program. Reviewed application process. Patient will provide proof of income, out of pocket spend report, and will sign application. Will collaborate with primary care provider, Dr. [name], for their portion of application. Once completed, will submit to [employer], [employer], [employer] patient assistance program. . Applications submitted on 03/30/2019. Will follow . Patient call concerning application.  Will follow up by Friday to check on approval status  Patient Self Care Activities:  . Patient will provide necessary portions of application   Please see past updates related to this goal by clicking on the "Past Updates" button in the selected goal      . I would like to optimize my diabetes management (pt-stated)     Current Barriers:  . Diabetes: T2DM; complicated by chronic medical conditions including HTN, HLD, hypothyroidism, most recent A1c 6.3% on 02/12/2020 . Current antihyperglycemic regimen: Ozempic 0.25mg  weekly o Will apply for PAP Ozempic 2021-submitted to company on 05/01/19 (application pending, samples provided today) o Patient tolerating well-->continue current dose . Denies hypoglycemic symptoms,Denies hyperglycemic symptoms . Current meal patterns: o Breakfast: eggs/protein/coffee o Lunch: sandwiches, incorporates veggie o Supper: chicken/protein, vegetable side, light carb o Snacks: veggies/fruit o Drinks: avoids sugary drinks, mostly intakes water/coffee/tea . Current exercise: light walking . Current blood glucose readings: 90-110s fasting . Cardiovascular risk reduction: o Current hypertensive regimen: olmesartan, amlodipine, ismo, hydralazine (encouraged patient to call in refills today, goal BP  <130/80) o Current hyperlipidemia regimen: statin intolerant, discusses risk/benefits with patient, no statins at this time o Current antiplatelet regimen: ASA 81  Pharmacist Clinical Goal(s):  [name] Over the next 90 days, patient will work with PharmD and primary care provider to address needs related to optimization of diabetes management.  Interventions: . Comprehensive medication review performed, medication list updated in electronic medical record . Reviewed & discussed the following diabetes-related information with patient: Follow ADA recommended "diabetes-friendly" diet  (reviewed healthy snack/food options) o Discussed GLP-1 injection technique o Reviewed medication purpose/side effects-->patient denies adverse event  Patient Self Care Activities:  . Patient will  check blood glucose 3x weekly in the AM , document, and provide at future appointments . Patient will focus on medication adherence . Patient will take medications as prescribed . Patient will contact provider with any episodes of hypoglycemia . Patient will report any questions or concerns to provider   Please see past updates related to this goal by clicking on the "Past Updates" button in the selected goal      . Patient Stated (pt-stated)     Wants to get off of medicines    . Weight (lb) < 200 lb (90.7 kg)     05/27/2019, wants to get down to 150 pounds and get off some medications       Fall Risk Fall Risk  05/27/2019 09/23/2018 05/21/2018 02/17/2018 12/26/2017  Falls in the past year? 0 0 1 0 No  Number falls in past yr: - - 0 - -  Comment - - slipped in bath tub - -  Injury with Fall? - - 0 - -  Risk for fall due to : Medication side effect - Medication side effect - -  Follow up Falls evaluation completed;Education provided;Falls prevention discussed - Education provided;Falls prevention discussed - -   Is the patient's home free of loose throw rugs in walkways, pet beds, electrical cords, etc?   yes       Grab bars in the bathroom? no      Handrails on the stairs?   n/a      Adequate lighting?   yes  Timed Get Up and Go performed: n/a  Depression Screen PHQ 2/9 Scores 05/27/2019 02/12/2019 11/13/2018 09/23/2018  PHQ - 2 Score 0 0 0 0  PHQ- 9 Score 1 4 4  -     Cognitive Function     6CIT Screen 05/27/2019 05/21/2018  What Year? 0 points 0 points  What month? 0 points 0 points  What time? 0 points 3 points  Count back from 20 0 points 0 points  Months in reverse 0 points 0 points  Repeat phrase 0 points 0 points  Total Score 0 3    Immunization History  Administered Date(s) Administered  . Influenza, High Dose Seasonal PF 03/30/2014, 01/31/2015, 04/04/2016, 12/18/2016, 11/13/2018  . Influenza-Unspecified 01/24/2013, 12/21/2017  . Moderna SARS-COVID-2 Vaccination 05/13/2019  . Pneumococcal Polysaccharide-23 12/27/2016  . Zoster 02/03/2013    Qualifies for Shingles Vaccine? yes  Screening Tests Health Maintenance  Topic Date Due  . HEMOGLOBIN A1C  08/12/2019  . FOOT EXAM  11/13/2019  . OPHTHALMOLOGY EXAM  02/27/2020  . MAMMOGRAM  02/17/2021  . TETANUS/TDAP  11/11/2022  . COLONOSCOPY  11/27/2022  . INFLUENZA VACCINE  Completed  . DEXA SCAN  Completed  . Hepatitis C Screening  Completed  . PNA vac Low Risk Adult  Completed    Cancer Screenings: Lung: Low Dose CT Chest recommended if Age 58-80 years, 30 pack-year currently smoking OR have quit w/in 15years. Patient does not qualify. Breast:  Up to date on Mammogram? Yes   Up to date of Bone Density/Dexa? Yes Colorectal: up to date  Additional Screenings: : Hepatitis C Screening: 04/03/2012     Plan:    Patient wants to get off some of her medications and lose weight.   I have personally reviewed and noted the following in the patient's chart:   . Medical and social history . Use of alcohol, tobacco or illicit drugs  . Current medications and supplements . Functional ability and status . Nutritional  status .  Physical activity . Advanced directives . List of other physicians . Hospitalizations, surgeries, and ER visits in previous 12 months . Vitals . Screenings to include cognitive, depression, and falls . Referrals and appointments  In addition, I have reviewed and discussed with patient certain preventive protocols, quality metrics, and best practice recommendations. A written personalized care plan for preventive services as well as general preventive health recommendations were provided to patient.     Barb Merino, LPN  05/25/4197

## 2019-05-27 NOTE — Patient Instructions (Addendum)
[name] , Thank you for taking time to come for your Medicare Wellness Visit. I appreciate your ongoing commitment to your health goals. Please review the following plan we discussed and let me know if I can assist you in the future.   Screening recommendations/referrals: Colonoscopy: 11/2012 Mammogram: 01/2019 Bone Density: 07/2014 Recommended yearly ophthalmology/optometry visit for glaucoma screening and checkup Recommended yearly dental visit for hygiene and checkup  Vaccinations: Influenza vaccine: 10/2018 Pneumococcal vaccine: 12/2016 Tdap vaccine: 10/2012 Shingles vaccine: discussed    Advanced directives: Advance directive discussed with you today. I have provided a copy for you to complete at home and have notarized. Once this is complete please bring a copy in to our office so we can scan it into your chart.   Conditions/risks identified: overweight  Next appointment: 09/30/2019 at 10:00   Preventive Care 65 Years and Older, Female Preventive care refers to lifestyle choices and visits with your health care provider that can promote health and wellness. What does preventive care include?  A yearly physical exam. This is also called an annual well check.  Dental exams once or twice a year.  Routine eye exams. Ask your health care provider how often you should have your eyes checked.  Personal lifestyle choices, including:  Daily care of your teeth and gums.  Regular physical activity.  Eating a healthy diet.  Avoiding tobacco and drug use.  Limiting alcohol use.  Practicing safe sex.  Taking low-dose aspirin every day.  Taking vitamin and mineral supplements as recommended by your health care provider. What happens during an annual well check? The services and screenings done by your health care provider during your annual well check will depend on your age, overall health, lifestyle risk factors, and family history of disease. Counseling  Your health care  provider may ask you questions about your:  Alcohol use.  Tobacco use.  Drug use.  Emotional well-being.  Home and relationship well-being.  Sexual activity.  Eating habits.  History of falls.  Memory and ability to understand (cognition).  Work and work Astronomer.  Reproductive health. Screening  You may have the following tests or measurements:  Height, weight, and BMI.  Blood pressure.  Lipid and cholesterol levels. These may be checked every 5 years, or more frequently if you are over 5 years old.  Skin check.  Lung cancer screening. You may have this screening every year starting at age 35 if you have a 30-pack-year history of smoking and currently smoke or have quit within the past 15 years.  Fecal occult blood test (FOBT) of the stool. You may have this test every year starting at age 45.  Flexible sigmoidoscopy or colonoscopy. You may have a sigmoidoscopy every 5 years or a colonoscopy every 10 years starting at age 45.  Hepatitis C blood test.  Hepatitis B blood test.  Sexually transmitted disease (STD) testing.  Diabetes screening. This is done by checking your blood sugar (glucose) after you have not eaten for a while (fasting). You may have this done every 1-3 years.  Bone density scan. This is done to screen for osteoporosis. You may have this done starting at age 21.  Mammogram. This may be done every 1-2 years. Talk to your health care provider about how often you should have regular mammograms. Talk with your health care provider about your test results, treatment options, and if necessary, the need for more tests. Vaccines  Your health care provider may recommend certain vaccines, such as:  Influenza vaccine. This is recommended every year.  Tetanus, diphtheria, and acellular pertussis (Tdap, Td) vaccine. You may need a Td booster every 10 years.  Zoster vaccine. You may need this after age 44.  Pneumococcal 13-valent conjugate (PCV13)  vaccine. One dose is recommended after age 64.  Pneumococcal polysaccharide (PPSV23) vaccine. One dose is recommended after age 59. Talk to your health care provider about which screenings and vaccines you need and how often you need them. This information is not intended to replace advice given to you by your health care provider. Make sure you discuss any questions you have with your health care provider. Document Released: 04/08/2015 Document Revised: 11/30/2015 Document Reviewed: 01/11/2015 Elsevier Interactive Patient Education  2017 [place] Prevention in the Home Falls can cause injuries. They can happen to people of all ages. There are many things you can do to make your home safe and to help prevent falls. What can I do on the outside of my home?  Regularly fix the edges of walkways and driveways and fix any cracks.  Remove anything that might make you trip as you walk through a door, such as a raised step or threshold.  Trim any bushes or trees on the path to your home.  Use bright outdoor lighting.  Clear any walking paths of anything that might make someone trip, such as rocks or tools.  Regularly check to see if handrails are loose or broken. Make sure that both sides of any steps have handrails.  Any raised decks and porches should have guardrails on the edges.  Have any leaves, snow, or ice cleared regularly.  Use sand or salt on walking paths during winter.  Clean up any spills in your garage right away. This includes oil or grease spills. What can I do in the bathroom?  Use night lights.  Install grab bars by the toilet and in the tub and shower. Do not use towel bars as grab bars.  Use non-skid mats or decals in the tub or shower.  If you need to sit down in the shower, use a plastic, non-slip stool.  Keep the floor dry. Clean up any water that spills on the floor as soon as it happens.  Remove soap buildup in the tub or shower  regularly.  Attach bath mats securely with double-sided non-slip rug tape.  Do not have throw rugs and other things on the floor that can make you trip. What can I do in the bedroom?  Use night lights.  Make sure that you have a light by your bed that is easy to reach.  Do not use any sheets or blankets that are too big for your bed. They should not hang down onto the floor.  Have a firm chair that has side arms. You can use this for support while you get dressed.  Do not have throw rugs and other things on the floor that can make you trip. What can I do in the kitchen?  Clean up any spills right away.  Avoid walking on wet floors.  Keep items that you use a lot in easy-to-reach places.  If you need to reach something above you, use a strong step stool that has a grab bar.  Keep electrical cords out of the way.  Do not use floor polish or wax that makes floors slippery. If you must use wax, use non-skid floor wax.  Do not have throw rugs and other things on the floor that  can make you trip. What can I do with my stairs?  Do not leave any items on the stairs.  Make sure that there are handrails on both sides of the stairs and use them. Fix handrails that are broken or loose. Make sure that handrails are as long as the stairways.  Check any carpeting to make sure that it is firmly attached to the stairs. Fix any carpet that is loose or worn.  Avoid having throw rugs at the top or bottom of the stairs. If you do have throw rugs, attach them to the floor with carpet tape.  Make sure that you have a light switch at the top of the stairs and the bottom of the stairs. If you do not have them, ask someone to add them for you. What else can I do to help prevent falls?  Wear shoes that:  Do not have high heels.  Have rubber bottoms.  Are comfortable and fit you well.  Are closed at the toe. Do not wear sandals.  If you use a stepladder:  Make sure that it is fully  opened. Do not climb a closed stepladder.  Make sure that both sides of the stepladder are locked into place.  Ask someone to hold it for you, if possible.  Clearly mark and make sure that you can see:  Any grab bars or handrails.  First and last steps.  Where the edge of each step is.  Use tools that help you move around (mobility aids) if they are needed. These include:  Canes.  Walkers.  Scooters.  Crutches.  Turn on the lights when you go into a dark area. Replace any light bulbs as soon as they burn out.  Set up your furniture so you have a clear path. Avoid moving your furniture around.  If any of your floors are uneven, fix them.  If there are any pets around you, be aware of where they are.  Review your medicines with your doctor. Some medicines can make you feel dizzy. This can increase your chance of falling. Ask your doctor what other things that you can do to help prevent falls. This information is not intended to replace advice given to you by your health care provider. Make sure you discuss any questions you have with your health care provider. Document Released: 01/06/2009 Document Revised: 08/18/2015 Document Reviewed: 04/16/2014 Elsevier Interactive Patient Education  2017 [employer].

## 2019-05-27 NOTE — Progress Notes (Signed)
This visit occurred during the SARS-CoV-2 public health emergency.  Safety protocols were in place, including screening questions prior to the visit, additional usage of staff PPE, and extensive cleaning of exam room while observing appropriate contact time as indicated for disinfecting solutions.  Subjective:     Patient ID: [name] , female    DOB: 03/16/1946 , 74 y.o.   MRN: [id number]   Chief Complaint  Patient presents with  . Annual Exam  . Diabetes  . Hypertension    HPI  She is here today for a full physical examination. She is no longer followed by GYN. She reports compliance with meds. Denies cp, sob, palpitations. Feels well. Without complaints today.   Diabetes She presents for her follow-up diabetic visit. She has type 2 diabetes mellitus. There are no hypoglycemic associated symptoms. Pertinent negatives for diabetes include no blurred vision and no chest pain. There are no hypoglycemic complications. Diabetic complications include heart disease and nephropathy. Risk factors for coronary artery disease include diabetes mellitus, dyslipidemia, hypertension and post-menopausal. She participates in exercise intermittently. Eye exam is current.  Hypertension This is a chronic problem. The current episode started more than 1 year ago. The problem has been gradually improving since onset. The problem is controlled. Pertinent negatives include no blurred vision or chest pain. The current treatment provides moderate improvement.     Past Medical History:  Diagnosis Date  . Benign hypertensive renal disease   . CKD stage 2 due to type 2 diabetes mellitus ([place])   . Hypertension   . Hypothyroidism   . Obesity   . OSA on CPAP   . Pure hypercholesterolemia   . PVD (peripheral vascular disease) ([place])   . Vitamin D deficiency      Family History  Problem Relation Age of Onset  . Alzheimer's disease Mother   . Aneurysm Father   . Cancer Brother      Current  Outpatient Medications:  .  albuterol (PROVENTIL HFA;VENTOLIN HFA) 108 (90 Base) MCG/ACT inhaler, Inhale 2 puffs into the lungs every 6 (six) hours as needed for wheezing or shortness of breath., Disp: 1 Inhaler, Rfl: 2 .  amLODipine (NORVASC) 5 MG tablet, , Disp: , Rfl:  .  aspirin 81 MG tablet, Take 81 mg by mouth daily., Disp: , Rfl:  .  b complex vitamins tablet, Take 1 tablet by mouth daily., Disp: , Rfl:  .  brimonidine (ALPHAGAN P) 0.1 % SOLN, Place 1 drop into both eyes every 8 (eight) hours., Disp: , Rfl:  .  calcium gluconate 500 MG tablet, Take 1 tablet by mouth daily. , Disp: , Rfl:  .  cilostazol (PLETAL) 100 MG tablet, Take 100 mg by mouth daily. , Disp: , Rfl: 3 .  ELDERBERRY PO, Take by mouth. 1 teaspoon 3 times per day, Disp: , Rfl:  .  furosemide (LASIX) 20 MG tablet, Take 1 tablet (20 mg total) by mouth daily., Disp: 90 tablet, Rfl: 2 .  hydrALAZINE (APRESOLINE) 10 MG tablet, Take 1 tablet (10 mg total) by mouth every 8 (eight) hours. (Patient taking differently: Take 10 mg by mouth 3 (three) times daily. ), Disp: 90 tablet, Rfl: 3 .  isosorbide mononitrate (IMDUR) 60 MG 24 hr tablet, Take 1 tablet (60 mg total) by mouth daily., Disp: 180 tablet, Rfl: 1 .  levothyroxine (SYNTHROID) 125 MCG tablet, TAKE 1 TABLET DAILY ON MONDAY THRU FRIDAY, Disp: 65 tablet, Rfl: 3 .  Magnesium 250 MG TABS, Take 1 tablet (  250 mg total) by mouth at bedtime., Disp: 90 tablet, Rfl: 2 .  Multiple Vitamins-Minerals (MULTIVITAMIN WITH MINERALS) tablet, Take 1 tablet by mouth daily., Disp: , Rfl:  .  nitroGLYCERIN (NITROSTAT) 0.4 MG SL tablet, Place 1 tablet (0.4 mg total) under the tongue every 5 (five) minutes x 3 doses as needed for chest pain., Disp: 25 tablet, Rfl: 1 .  olmesartan (BENICAR) 40 MG tablet, TAKE 1 TABLET EVERY DAY, Disp: 90 tablet, Rfl: 1 .  OZEMPIC 0.25 or 0.5 MG/DOSE SOPN, INJECT 0.5MG INTO SKIN EVERY WEEK, IN THE ABDOMEN, THIGHS, OR UPPER ARM ROTATING INJECTION SITES, Disp: , Rfl:  1 .  potassium chloride (K-DUR) 10 MEQ tablet, Take 10 mEq by mouth once. , Disp: , Rfl: 3   Allergies  Allergen Reactions  . Atorvastatin Other (See Comments) and Swelling    unknown  . Clopidogrel   . Metoprolol Other (See Comments)    Slows heart rate to low  . Metoprolol Tartrate Swelling  . Pregabalin Swelling  . Rosuvastatin Calcium Other (See Comments)    unknown     The patient states she uses status post hysterectomy for birth control. Last LMP was No LMP recorded. Patient has had a hysterectomy.. Negative for Dysmenorrhea xNegative for: breast discharge, breast lump(s), breast pain and breast self exam. Associated symptoms include abnormal vaginal bleeding. Pertinent negatives include abnormal bleeding (hematology), anxiety, decreased libido, depression, difficulty falling sleep, dyspareunia, history of infertility, nocturia, sexual dysfunction, sleep disturbances, urinary incontinence, urinary urgency, vaginal discharge and vaginal itching. Diet regular.The patient states her exercise level is  intermittent.   . The patient's tobacco use is:  Social History   Tobacco Use  Smoking Status Never Smoker  Smokeless Tobacco Never Used  . She has been exposed to passive smoke. The patient's alcohol use is:  Social History   Substance and Sexual Activity  Alcohol Use No  . Alcohol/week: 0.0 standard drinks   Comment: occasional wine    Review of Systems  Constitutional: Negative.   HENT: Negative.   Eyes: Negative.  Negative for blurred vision.  Respiratory: Negative.   Cardiovascular: Negative.  Negative for chest pain.  Endocrine: Negative.   Genitourinary: Negative.   Musculoskeletal: Negative.   Skin: Negative.   Allergic/Immunologic: Negative.   Neurological: Negative.   Hematological: Negative.   Psychiatric/Behavioral: Negative.      Today's Vitals   05/27/19 1021  BP: 109/62  Pulse: (!) 56  Temp: 98.7 F (37.1 C)  TempSrc: Oral  Weight: 154 lb 9.6 oz  (70.1 kg)  Height: 5' 1.8" (1.57 m)  PainSc: 0-No pain   Body mass index is 28.46 kg/m.   Objective:  Physical Exam Vitals and nursing note reviewed.  Constitutional:      Appearance: Normal appearance.  HENT:     Head: Normocephalic and atraumatic.     Right Ear: Tympanic membrane, ear canal and external ear normal.     Left Ear: Tympanic membrane, ear canal and external ear normal.     Nose:     Comments: Deferred, masked    Mouth/Throat:     Comments: Deferred, masked Eyes:     Extraocular Movements: Extraocular movements intact.     Conjunctiva/sclera: Conjunctivae normal.     Pupils: Pupils are equal, round, and reactive to light.  Cardiovascular:     Rate and Rhythm: Normal rate and regular rhythm.     Pulses:          Dorsalis pedis pulses are 1+  on the right side and 1+ on the left side.     Heart sounds: Normal heart sounds.  Pulmonary:     Effort: Pulmonary effort is normal.     Breath sounds: Normal breath sounds.  Chest:     Breasts: Tanner Score is 5.        Right: Normal.        Left: Normal.  Abdominal:     General: Abdomen is flat. Bowel sounds are normal.     Palpations: Abdomen is soft.  Genitourinary:    Comments: deferred Musculoskeletal:        General: Normal range of motion.     Cervical back: Normal range of motion and neck supple.  Feet:     Right foot:     Protective Sensation: 5 sites tested. 5 sites sensed.     Skin integrity: Callus and dry skin present.     Toenail Condition: Right toenails are normal.     Left foot:     Protective Sensation: 5 sites tested. 5 sites sensed.     Skin integrity: Callus and dry skin present.     Toenail Condition: Left toenails are normal.  Skin:    General: Skin is warm and dry.  Neurological:     General: No focal deficit present.     Mental Status: She is alert and oriented to person, place, and time.  Psychiatric:        Mood and Affect: Mood normal.        Behavior: Behavior normal.          Assessment And Plan:     1. Routine general medical examination at health care facility  A full exam was performed.  Importance of monthly self breast exams was discussed with the patient. PATIENT IS ADVISED TO GET 30-45 MINUTES REGULAR EXERCISE NO LESS THAN FOUR TO FIVE DAYS PER WEEK - BOTH WEIGHTBEARING EXERCISES AND AEROBIC ARE RECOMMENDED.  SHE IS ADVISED TO FOLLOW A HEALTHY DIET WITH AT LEAST SIX FRUITS/VEGGIES PER DAY, DECREASE INTAKE OF RED MEAT, AND TO INCREASE FISH INTAKE TO TWO DAYS PER WEEK.  MEATS/FISH SHOULD NOT BE FRIED, BAKED OR BROILED IS PREFERABLE.  I SUGGEST WEARING SPF 50 SUNSCREEN ON EXPOSED PARTS AND ESPECIALLY WHEN IN THE DIRECT SUNLIGHT FOR AN EXTENDED PERIOD OF TIME.  PLEASE AVOID FAST FOOD RESTAURANTS AND INCREASE YOUR WATER INTAKE.   2. Type 2 diabetes mellitus with stage 2 chronic kidney disease, without long-term current use of insulin ([place])  Diabetic foot exam was performed.  I DISCUSSED WITH THE PATIENT AT LENGTH REGARDING THE GOALS OF GLYCEMIC CONTROL AND POSSIBLE LONG-TERM COMPLICATIONS.  I  ALSO STRESSED THE IMPORTANCE OF COMPLIANCE WITH HOME GLUCOSE MONITORING, DIETARY RESTRICTIONS INCLUDING AVOIDANCE OF SUGARY DRINKS/PROCESSED FOODS,  ALONG WITH REGULAR EXERCISE.  I  ALSO STRESSED THE IMPORTANCE OF ANNUAL EYE EXAMS, SELF FOOT CARE AND COMPLIANCE WITH OFFICE VISITS.  - CMP14+EGFR - CBC - Lipid panel - Hemoglobin A1c  3. Parenchymal renal hypertension, stage 1 through stage 4 or unspecified chronic kidney disease  Chronic, well controlled.  She will continue with current meds. She is encouraged to avoid adding salt to her foods. EKG performed, NSr w/o acute changes.   - EKG 12-Lead  4. Overweight with body mass index (BMI) of 28 to 28.9 in adult  Her BMI is stable for her demographic. She is encouraged to incorporate more activity into her daily routine. Specifically, advised to exercise 30 minutes five days per week.  5. Primary hypothyroidism  I  will check thyroid panel and adjust meds as needed.  - TSH - T4, free  6. Vitamin D deficiency  I WILL CHECK A VIT D LEVEL AND SUPPLEMENT AS NEEDED.  ALSO ENCOURAGED TO SPEND 15 MINUTES IN THE SUN DAILY.  - Vitamin D (25 hydroxy)    [name], MD    THE PATIENT IS ENCOURAGED TO PRACTICE SOCIAL DISTANCING DUE TO THE COVID-19 PANDEMIC.

## 2019-05-27 NOTE — Patient Instructions (Signed)
Health Maintenance, Female Adopting a healthy lifestyle and getting preventive care are important in promoting health and wellness. Ask your health care provider about:  The right schedule for you to have regular tests and exams.  Things you can do on your own to prevent diseases and keep yourself healthy. What should I know about diet, weight, and exercise? Eat a healthy diet   Eat a diet that includes plenty of vegetables, fruits, low-fat dairy products, and lean protein.  Do not eat a lot of foods that are high in solid fats, added sugars, or sodium. Maintain a healthy weight Body mass index (BMI) is used to identify weight problems. It estimates body fat based on height and weight. Your health care provider can help determine your BMI and help you achieve or maintain a healthy weight. Get regular exercise Get regular exercise. This is one of the most important things you can do for your health. Most adults should:  Exercise for at least 150 minutes each week. The exercise should increase your heart rate and make you sweat (moderate-intensity exercise).  Do strengthening exercises at least twice a week. This is in addition to the moderate-intensity exercise.  Spend less time sitting. Even light physical activity can be beneficial. Watch cholesterol and blood lipids Have your blood tested for lipids and cholesterol at 75 years of age, then have this test every 5 years. Have your cholesterol levels checked more often if:  Your lipid or cholesterol levels are high.  You are older than 75 years of age.  You are at high risk for heart disease. What should I know about cancer screening? Depending on your health history and family history, you may need to have cancer screening at various ages. This may include screening for:  Breast cancer.  Cervical cancer.  Colorectal cancer.  Skin cancer.  Lung cancer. What should I know about heart disease, diabetes, and high blood  pressure? Blood pressure and heart disease  High blood pressure causes heart disease and increases the risk of stroke. This is more likely to develop in people who have high blood pressure readings, are of African descent, or are overweight.  Have your blood pressure checked: ? Every 3-5 years if you are 18-39 years of age. ? Every year if you are 40 years old or older. Diabetes Have regular diabetes screenings. This checks your fasting blood sugar level. Have the screening done:  Once every three years after age 40 if you are at a normal weight and have a low risk for diabetes.  More often and at a younger age if you are overweight or have a high risk for diabetes. What should I know about preventing infection? Hepatitis B If you have a higher risk for hepatitis B, you should be screened for this virus. Talk with your health care provider to find out if you are at risk for hepatitis B infection. Hepatitis C Testing is recommended for:  Everyone born from 1945 through 1965.  Anyone with known risk factors for hepatitis C. Sexually transmitted infections (STIs)  Get screened for STIs, including gonorrhea and chlamydia, if: ? You are sexually active and are younger than 75 years of age. ? You are older than 75 years of age and your health care provider tells you that you are at risk for this type of infection. ? Your sexual activity has changed since you were last screened, and you are at increased risk for chlamydia or gonorrhea. Ask your health care provider if   you are at risk.  Ask your health care provider about whether you are at high risk for HIV. Your health care provider may recommend a prescription medicine to help prevent HIV infection. If you choose to take medicine to prevent HIV, you should first get tested for HIV. You should then be tested every 3 months for as long as you are taking the medicine. Pregnancy  If you are about to stop having your period (premenopausal) and  you may become pregnant, seek counseling before you get pregnant.  Take 400 to 800 micrograms (mcg) of folic acid every day if you become pregnant.  Ask for birth control (contraception) if you want to prevent pregnancy. Osteoporosis and menopause Osteoporosis is a disease in which the bones lose minerals and strength with aging. This can result in bone fractures. If you are 65 years old or older, or if you are at risk for osteoporosis and fractures, ask your health care provider if you should:  Be screened for bone loss.  Take a calcium or vitamin D supplement to lower your risk of fractures.  Be given hormone replacement therapy (HRT) to treat symptoms of menopause. Follow these instructions at home: Lifestyle  Do not use any products that contain nicotine or tobacco, such as cigarettes, e-cigarettes, and chewing tobacco. If you need help quitting, ask your health care provider.  Do not use street drugs.  Do not share needles.  Ask your health care provider for help if you need support or information about quitting drugs. Alcohol use  Do not drink alcohol if: ? Your health care provider tells you not to drink. ? You are pregnant, may be pregnant, or are planning to become pregnant.  If you drink alcohol: ? Limit how much you use to 0-1 drink a day. ? Limit intake if you are breastfeeding.  Be aware of how much alcohol is in your drink. In the [place] one drink equals one 12 oz bottle of beer (355 mL), one 5 oz glass of wine (148 mL), or one 1 oz glass of hard liquor (44 mL). General instructions  Schedule regular health, dental, and eye exams.  Stay current with your vaccines.  Tell your health care provider if: ? You often feel depressed. ? You have ever been abused or do not feel safe at home. Summary  Adopting a healthy lifestyle and getting preventive care are important in promoting health and wellness.  Follow your health care provider's instructions about healthy  diet, exercising, and getting tested or screened for diseases.  Follow your health care provider's instructions on monitoring your cholesterol and blood pressure. This information is not intended to replace advice given to you by your health care provider. Make sure you discuss any questions you have with your health care provider. Document Revised: 03/05/2018 Document Reviewed: 03/05/2018 Elsevier Patient Education  2020 Elsevier Inc.  

## 2019-05-28 ENCOUNTER — Telehealth: Payer: Self-pay

## 2019-05-29 ENCOUNTER — Ambulatory Visit: Payer: Self-pay

## 2019-05-29 DIAGNOSIS — I10 Essential (primary) hypertension: Secondary | ICD-10-CM

## 2019-05-29 DIAGNOSIS — E1122 Type 2 diabetes mellitus with diabetic chronic kidney disease: Secondary | ICD-10-CM

## 2019-05-29 DIAGNOSIS — E039 Hypothyroidism, unspecified: Secondary | ICD-10-CM

## 2019-05-29 DIAGNOSIS — E7800 Pure hypercholesterolemia, unspecified: Secondary | ICD-10-CM

## 2019-05-29 NOTE — Progress Notes (Signed)
This encounter was created in error - please disregard.

## 2019-05-30 DIAGNOSIS — E1165 Type 2 diabetes mellitus with hyperglycemia: Secondary | ICD-10-CM | POA: Diagnosis not present

## 2019-05-30 DIAGNOSIS — Z794 Long term (current) use of insulin: Secondary | ICD-10-CM | POA: Diagnosis not present

## 2019-05-30 DIAGNOSIS — E119 Type 2 diabetes mellitus without complications: Secondary | ICD-10-CM | POA: Diagnosis not present

## 2019-06-01 NOTE — Chronic Care Management (AMB) (Signed)
  Chronic Care Management   Outreach Note  06/01/2019 Name: [name] MRN: [id number] DOB: 09/15/1945  Referred by: [name], MD Reason for referral : Chronic Care Management ([facility] Initial Call - HTN, DMII with Stage II CKD, Hypothyroidism, Hypercholesterolemia)   An unsuccessful telephone outreach was attempted today. The patient was referred to the case management team for assistance with care management and care coordination.   Follow Up Plan: Telephone follow up appointment with care management team member scheduled for:06/25/19  Delsa Sale, RN, BSN, CCM Care Management Coordinator [facility]  Direct Phone: [phone]

## 2019-06-02 DIAGNOSIS — N182 Chronic kidney disease, stage 2 (mild): Secondary | ICD-10-CM | POA: Diagnosis not present

## 2019-06-02 DIAGNOSIS — E039 Hypothyroidism, unspecified: Secondary | ICD-10-CM | POA: Diagnosis not present

## 2019-06-02 DIAGNOSIS — E1122 Type 2 diabetes mellitus with diabetic chronic kidney disease: Secondary | ICD-10-CM | POA: Diagnosis not present

## 2019-06-02 DIAGNOSIS — E559 Vitamin D deficiency, unspecified: Secondary | ICD-10-CM | POA: Diagnosis not present

## 2019-06-03 LAB — CBC
Hematocrit: 36.9 % (ref 34.0–46.6)
Hemoglobin: 12.2 g/dL (ref 11.1–15.9)
MCH: 31 pg (ref 26.6–33.0)
MCHC: 33.1 g/dL (ref 31.5–35.7)
MCV: 94 fL (ref 79–97)
Platelets: 248 10*3/uL (ref 150–450)
RBC: 3.94 x10E6/uL (ref 3.77–5.28)
RDW: 11.8 % (ref 11.7–15.4)
WBC: 4 10*3/uL (ref 3.4–10.8)

## 2019-06-03 LAB — LIPID PANEL
Chol/HDL Ratio: 2.9 ratio (ref 0.0–4.4)
Cholesterol, Total: 199 mg/dL (ref 100–199)
HDL: 68 mg/dL (ref >39)
LDL Chol Calc (NIH): 116 mg/dL — ABNORMAL HIGH (ref 0–99)
Triglycerides: 81 mg/dL (ref 0–149)
VLDL Cholesterol Cal: 15 mg/dL (ref 5–40)

## 2019-06-03 LAB — HEMOGLOBIN A1C
Est. average glucose Bld gHb Est-mCnc: 114 mg/dL
Hgb A1c MFr Bld: 5.6 % (ref 4.8–5.6)

## 2019-06-03 LAB — CMP14+EGFR
ALT: 17 IU/L (ref 0–32)
AST: 38 IU/L (ref 0–40)
Albumin/Globulin Ratio: 2.1 (ref 1.2–2.2)
Albumin: 4.5 g/dL (ref 3.7–4.7)
Alkaline Phosphatase: 58 IU/L (ref 39–117)
BUN/Creatinine Ratio: 14 (ref 12–28)
BUN: 15 mg/dL (ref 8–27)
Bilirubin Total: 0.3 mg/dL (ref 0.0–1.2)
CO2: 22 mmol/L (ref 20–29)
Calcium: 10.2 mg/dL (ref 8.7–10.3)
Chloride: 104 mmol/L (ref 96–106)
Creatinine, Ser: 1.08 mg/dL — ABNORMAL HIGH (ref 0.57–1.00)
GFR calc Af Amer: 58 mL/min/{1.73_m2} — ABNORMAL LOW (ref >59)
GFR calc non Af Amer: 51 mL/min/{1.73_m2} — ABNORMAL LOW (ref >59)
Globulin, Total: 2.1 g/dL (ref 1.5–4.5)
Glucose: 114 mg/dL — ABNORMAL HIGH (ref 65–99)
Potassium: 4.6 mmol/L (ref 3.5–5.2)
Sodium: 139 mmol/L (ref 134–144)
Total Protein: 6.6 g/dL (ref 6.0–8.5)

## 2019-06-03 LAB — VITAMIN D 25 HYDROXY (VIT D DEFICIENCY, FRACTURES): Vit D, 25-Hydroxy: 44.9 ng/mL (ref 30.0–100.0)

## 2019-06-03 LAB — TSH: TSH: 0.602 u[IU]/mL (ref 0.450–4.500)

## 2019-06-03 LAB — T4, FREE: Free T4: 1.75 ng/dL (ref 0.82–1.77)

## 2019-06-10 ENCOUNTER — Ambulatory Visit: Payer: Medicare PPO | Attending: Internal Medicine

## 2019-06-10 DIAGNOSIS — Z23 Encounter for immunization: Secondary | ICD-10-CM

## 2019-06-10 NOTE — Progress Notes (Signed)
   Covid-19 Vaccination Clinic  Name:  [name]    MRN: [id number] DOB: Dec 23, 1945  06/10/2019  [name] was observed post Covid-19 immunization for 15 minutes without incident. She was provided with Vaccine Information Sheet and instruction to access the V-Safe system.   [name] was instructed to call 911 with any severe reactions post vaccine: [name] Difficulty breathing  . Swelling of face and throat  . A fast heartbeat  . A bad rash all over body  . Dizziness and weakness   Immunizations Administered    Name Date Dose VIS Date Route   Moderna COVID-19 Vaccine 06/10/2019 10:47 AM 0.5 mL 02/24/2019 Intramuscular   Manufacturer: Moderna   Lot: [id number]   NDC: 56387-564-33

## 2019-06-15 DIAGNOSIS — I2510 Atherosclerotic heart disease of native coronary artery without angina pectoris: Secondary | ICD-10-CM | POA: Diagnosis not present

## 2019-06-15 DIAGNOSIS — E7849 Other hyperlipidemia: Secondary | ICD-10-CM | POA: Diagnosis not present

## 2019-06-15 DIAGNOSIS — E034 Atrophy of thyroid (acquired): Secondary | ICD-10-CM | POA: Diagnosis not present

## 2019-06-15 DIAGNOSIS — R072 Precordial pain: Secondary | ICD-10-CM | POA: Diagnosis not present

## 2019-06-25 ENCOUNTER — Telehealth: Payer: Self-pay

## 2019-06-25 ENCOUNTER — Other Ambulatory Visit: Payer: Self-pay

## 2019-06-25 ENCOUNTER — Ambulatory Visit (INDEPENDENT_AMBULATORY_CARE_PROVIDER_SITE_OTHER): Payer: Medicare PPO

## 2019-06-25 DIAGNOSIS — N182 Chronic kidney disease, stage 2 (mild): Secondary | ICD-10-CM | POA: Diagnosis not present

## 2019-06-25 DIAGNOSIS — E1122 Type 2 diabetes mellitus with diabetic chronic kidney disease: Secondary | ICD-10-CM | POA: Diagnosis not present

## 2019-06-25 DIAGNOSIS — E7800 Pure hypercholesterolemia, unspecified: Secondary | ICD-10-CM

## 2019-06-25 DIAGNOSIS — I10 Essential (primary) hypertension: Secondary | ICD-10-CM

## 2019-06-25 DIAGNOSIS — I2510 Atherosclerotic heart disease of native coronary artery without angina pectoris: Secondary | ICD-10-CM | POA: Diagnosis not present

## 2019-06-25 DIAGNOSIS — E039 Hypothyroidism, unspecified: Secondary | ICD-10-CM | POA: Diagnosis not present

## 2019-06-30 ENCOUNTER — Other Ambulatory Visit: Payer: Self-pay

## 2019-06-30 MED ORDER — OLMESARTAN MEDOXOMIL 40 MG PO TABS: 40.0000 mg | ORAL_TABLET | Freq: Every day | ORAL | 2 refills | Status: DC

## 2019-06-30 NOTE — Patient Instructions (Signed)
Visit Information  Goals Addressed            This Visit's Progress     Patient Stated   . "I would like to keep my Diabetes under good conrol" (pt-stated)       CARE PLAN ENTRY (see longtitudinal plan of care for additional care plan information)  Current Barriers:  [name] Knowledge Deficits related to disease process and Self Health management of DM . Chronic Disease Management support and education needs related to DM II with stage III CKD, Hypothyroidism, Hypercholesterolemia, HTN  Nurse Case Manager Clinical Goal(s):  [name] Over the next 90 days, patient will work with the CCM team and PCP to address needs related to disease education and support to improve Self Health management of DM.   Interventions:  . Evaluation of current treatment plan related to DM and patient's adherence to plan as established by provider. . Provided education to patient re: current A1c 6.3%; educated on target A1c; educated on ways to achieve/maintain A1C <7.0% including medication adherence, following ADA recommended diet and exercise w/150 minutes per week as tolerated, maintaining a health weight and keeping MD f/u appts for DM recheck; Educated on daily glycemic control, FBS 80-130, <180 after meals   . Reviewed medications with patient and discussed indication, dosage and frequency of prescribed medications; patient working with Pharm D for financial assistance of meds, denies having noted SE  . Discussed plans with patient for ongoing care management follow up and provided patient with direct contact information for care management team . Provided patient with printed educational materials related to Diabetes Management with Meal Planning, Diabetes Safety Zone Tool; Carb Choices, Carb Counting . Advised patient, providing education and rationale, to check cbg daily before meals and record, calling CCM RN and or PCP for findings outside established parameters.    Patient Self Care Activities:  . Self  administers medications as prescribed . Attends all scheduled provider appointments . Calls pharmacy for medication refills . Performs ADL's independently . Performs IADL's independently . Calls provider office for new concerns or questions  Initial goal documentation     . "I would like to keep my kidney's healthy" (pt-stated)       CARE PLAN ENTRY (see longtitudinal plan of care for additional care plan information)  Current Barriers:  [name] Knowledge Deficits related to disease process and Self Health management of CKD . Chronic Disease Management support and education needs related to DMII with stage 3 CKD, Hypothyroidism, Hypercholesterolemia, HTN  Nurse Case Manager Clinical Goal(s):  [name] Over the next 90 days, patient will work with CCM RN and PCP to address needs related to disease education and support for improved Self Management of CKD  Interventions:  . Evaluation of current treatment plan related to CKD and patient's adherence to plan as established by provider. . Provided education to patient re: stages of CKD providing rationale and discussed patient's current GFR of 58; Educated on ways to improve kidney function and Self Health management of this condition  . Discussed plans with patient for ongoing care management follow up and provided patient with direct contact information for care management team . Provided patient with printed educational materials related to Stages of CKD; 6 Ways to be Water Wise   Patient Self Care Activities:  . Self administers medications as prescribed . Attends all scheduled provider appointments . Calls pharmacy for medication refills . Performs ADL's independently . Performs IADL's independently . Calls provider office for new concerns or questions  Initial goal documentation     . COMPLETED: I would like to apply for medication assistance for my diabetes medications (pt-stated)       Current Barriers:  . Financial Barriers in  complicated patient with multiple medical conditions including T2DM, HTN, HLD; patient has [employer] and reports copay for inhaler, Ozempci & eye drops are cost prohibitive at this time  Pharmacist Clinical Goal(s):  [name] Over the next 30 days, patient will work with PharmD and providers to relieve medication access concerns  Interventions: . Comprehensive medication review completed; medication list updated in electronic medical record.  . Patient meets income/No out of pocket spend criteria for this medication's patient assistance program. Reviewed application process. Patient will provide proof of income, out of pocket spend report, and will sign application. Will collaborate with primary care provider, Dr. [name], for their portion of application. Once completed, will submit to [employer] patient assistance program. . Applications submitted on 03/30/2019. Will follow . Patient call concerning application.  Will follow up by Friday to check on approval status  Patient Self Care Activities:  . Patient will provide necessary portions of application   Please see past updates related to this goal by clicking on the "Past Updates" button in the selected goal      . COMPLETED: I would like to optimize my diabetes management (pt-stated)       Current Barriers:  . Diabetes: T2DM; complicated by chronic medical conditions including HTN, HLD, hypothyroidism, most recent A1c 6.3% on 02/12/2020 . Current antihyperglycemic regimen: Ozempic 0.25mg  weekly o Will apply for PAP Ozempic 2021-submitted to company on 05/01/19 (application pending, samples provided today) o Patient tolerating well-->continue current dose . Denies hypoglycemic symptoms,Denies hyperglycemic symptoms . Current meal patterns: o Breakfast: eggs/protein/coffee o Lunch: sandwiches, incorporates veggie o Supper: chicken/protein, vegetable side, light carb o Snacks: veggies/fruit o Drinks: avoids sugary  drinks, mostly intakes water/coffee/tea . Current exercise: light walking . Current blood glucose readings: 90-110s fasting . Cardiovascular risk reduction: o Current hypertensive regimen: olmesartan, amlodipine, ismo, hydralazine (encouraged patient to call in refills today, goal BP <130/80) o Current hyperlipidemia regimen: statin intolerant, discusses risk/benefits with patient, no statins at this time o Current antiplatelet regimen: ASA 81  Pharmacist Clinical Goal(s):  [name] Over the next 90 days, patient will work with PharmD and primary care provider to address needs related to optimization of diabetes management.  Interventions: . Comprehensive medication review performed, medication list updated in electronic medical record . Reviewed & discussed the following diabetes-related information with patient: Follow ADA recommended "diabetes-friendly" diet  (reviewed healthy snack/food options) o Discussed GLP-1 injection technique o Reviewed medication purpose/side effects-->patient denies adverse event  Patient Self Care Activities:  . Patient will check blood glucose 3x weekly in the AM , document, and provide at future appointments . Patient will focus on medication adherence . Patient will take medications as prescribed . Patient will contact provider with any episodes of hypoglycemia . Patient will report any questions or concerns to provider   Please see past updates related to this goal by clicking on the "Past Updates" button in the selected goal         Patient verbalizes understanding of instructions provided today.   The care management team will reach out to the patient again over the next 30-45 days.   Delsa Sale, RN, BSN, CCM Care Management Coordinator [facility]  Direct Phone: [phone]

## 2019-06-30 NOTE — Chronic Care Management (AMB) (Signed)
Chronic Care Management   Initial Visit Note  06/29/2019 Name: [name] MRN: [id number] DOB: 12/09/1945  Referred by: [name], MD Reason for referral : Chronic Care Management (RQ #2 Initial RN Call )   [name] is a 74 y.o. year old female who is a primary care patient of [name], MD. The CCM team was consulted for assistance with chronic disease management and care coordination needs related to HTN, HLD, DMII and CKD Stage 3  Review of patient status, including review of consultants reports, relevant laboratory and other test results, and collaboration with appropriate care team members and the patient's provider was performed as part of comprehensive patient evaluation and provision of chronic care management services.    SDOH (Social Determinants of Health) assessments performed: Yes - no SDOH needs identified at this time See Care Plan activities for detailed interventions related to SDOH    Placed initial CCM RN CM outbound call to patient to assess for CCM needs, a care plan was established.    Medications: Outpatient Encounter Medications as of 06/25/2019  Medication Sig  . amLODipine (NORVASC) 5 MG tablet   . OZEMPIC 0.25 or 0.5 MG/DOSE SOPN Patient is taking 0.25 mg weekly  . albuterol (PROVENTIL HFA;VENTOLIN HFA) 108 (90 Base) MCG/ACT inhaler Inhale 2 puffs into the lungs every 6 (six) hours as needed for wheezing or shortness of breath.  [name] aspirin 81 MG tablet Take 81 mg by mouth daily.  [name] b complex vitamins tablet Take 1 tablet by mouth daily.  . brimonidine (ALPHAGAN P) 0.1 % SOLN Place 1 drop into both eyes every 8 (eight) hours.  . calcium gluconate 500 MG tablet Take 1 tablet by mouth daily.   . cilostazol (PLETAL) 100 MG tablet Take 100 mg by mouth daily.   [name] ELDERBERRY PO Take by mouth. 1 teaspoon 3 times per day  . furosemide (LASIX) 20 MG tablet Take 1 tablet (20 mg total) by mouth daily.  . hydrALAZINE (APRESOLINE) 10 MG tablet  Take 1 tablet (10 mg total) by mouth every 8 (eight) hours. (Patient taking differently: Take 10 mg by mouth 3 (three) times daily. )  . isosorbide mononitrate (IMDUR) 60 MG 24 hr tablet Take 1 tablet (60 mg total) by mouth daily.  [name] levothyroxine (SYNTHROID) 125 MCG tablet TAKE 1 TABLET DAILY ON MONDAY THRU FRIDAY  . Magnesium 250 MG TABS Take 1 tablet (250 mg total) by mouth at bedtime.  . Multiple Vitamins-Minerals (MULTIVITAMIN WITH MINERALS) tablet Take 1 tablet by mouth daily.  . nitroGLYCERIN (NITROSTAT) 0.4 MG SL tablet Place 1 tablet (0.4 mg total) under the tongue every 5 (five) minutes x 3 doses as needed for chest pain.  . potassium chloride (K-DUR) 10 MEQ tablet Take 10 mEq by mouth once.   . [DISCONTINUED] olmesartan (BENICAR) 40 MG tablet TAKE 1 TABLET EVERY DAY   No facility-administered encounter medications on file as of 06/25/2019.     Objective:  Lab Results  Component Value Date   HGBA1C 5.6 06/02/2019   HGBA1C 6.3 (H) 02/12/2019   HGBA1C 5.7 (H) 11/13/2018   Lab Results  Component Value Date   MICROALBUR 10 05/27/2019   LDLCALC 116 (H) 06/02/2019   CREATININE 1.08 (H) 06/02/2019   BP Readings from Last 3 Encounters:  05/27/19 109/62  05/27/19 109/62  02/12/19 116/78    Goals Addressed      Patient Stated   . "I would like to keep my Diabetes under good conrol" (  pt-stated)       CARE PLAN ENTRY (see longtitudinal plan of care for additional care plan information)  Current Barriers:  [name] Knowledge Deficits related to disease process and Self Health management of DM . Chronic Disease Management support and education needs related to DM II with stage III CKD, Hypothyroidism, Hypercholesterolemia, HTN  Nurse Case Manager Clinical Goal(s):  [name] Over the next 90 days, patient will work with the CCM team and PCP to address needs related to disease education and support to improve Self Health management of DM.   Interventions:  . Evaluation of current treatment  plan related to DM and patient's adherence to plan as established by provider. . Provided education to patient re: current A1c 6.3%; educated on target A1c; educated on ways to achieve/maintain A1C <7.0% including medication adherence, following ADA recommended diet and exercise w/150 minutes per week as tolerated, maintaining a health weight and keeping MD f/u appts for DM recheck; Educated on daily glycemic control, FBS 80-130, <180 after meals   . Reviewed medications with patient and discussed indication, dosage and frequency of prescribed medications; patient working with Pharm D for financial assistance of meds, denies having noted SE  . Discussed plans with patient for ongoing care management follow up and provided patient with direct contact information for care management team . Provided patient with printed educational materials related to Diabetes Management with Meal Planning, Diabetes Safety Zone Tool; Carb Choices, Carb Counting . Advised patient, providing education and rationale, to check cbg daily before meals and record, calling CCM RN and or PCP for findings outside established parameters.    Patient Self Care Activities:  . Self administers medications as prescribed . Attends all scheduled provider appointments . Calls pharmacy for medication refills . Performs ADL's independently . Performs IADL's independently . Calls provider office for new concerns or questions  Initial goal documentation     . "I would like to keep my kidney's healthy" (pt-stated)       CARE PLAN ENTRY (see longtitudinal plan of care for additional care plan information)  Current Barriers:  [name] Knowledge Deficits related to disease process and Self Health management of CKD . Chronic Disease Management support and education needs related to DMII with stage 3 CKD, Hypothyroidism, Hypercholesterolemia, HTN  Nurse Case Manager Clinical Goal(s):  [name] Over the next 90 days, patient will work with CCM RN and  PCP to address needs related to disease education and support for improved Self Management of CKD  Interventions:  . Evaluation of current treatment plan related to CKD and patient's adherence to plan as established by provider. . Provided education to patient re: stages of CKD providing rationale and discussed patient's current GFR of 58; Educated on ways to improve kidney function and Self Health management of this condition  . Discussed plans with patient for ongoing care management follow up and provided patient with direct contact information for care management team . Provided patient with printed educational materials related to Stages of CKD; 6 Ways to be Water Wise   Patient Self Care Activities:  . Self administers medications as prescribed . Attends all scheduled provider appointments . Calls pharmacy for medication refills . Performs ADL's independently . Performs IADL's independently . Calls provider office for new concerns or questions  Initial goal documentation     . COMPLETED: I would like to apply for medication assistance for my diabetes medications (pt-stated)       Current Barriers:  . Financial Barriers in complicated  patient with multiple medical conditions including T2DM, HTN, HLD; patient has [employer] and reports copay for inhaler, Ozempci & eye drops are cost prohibitive at this time  Pharmacist Clinical Goal(s):  [name] Over the next 30 days, patient will work with PharmD and providers to relieve medication access concerns  Interventions: . Comprehensive medication review completed; medication list updated in electronic medical record.  . Patient meets income/No out of pocket spend criteria for this medication's patient assistance program. Reviewed application process. Patient will provide proof of income, out of pocket spend report, and will sign application. Will collaborate with primary care provider, Dr. Glendale Chard, for their portion of  application. Once completed, will submit to [employer], [place], [employer] patient assistance program. . Applications submitted on 03/30/2019. Will follow . Patient call concerning application.  Will follow up by Friday to check on approval status  Patient Self Care Activities:  . Patient will provide necessary portions of application   Please see past updates related to this goal by clicking on the "Past Updates" button in the selected goal      . COMPLETED: I would like to optimize my diabetes management (pt-stated)       Current Barriers:  . Diabetes: J2IN; complicated by chronic medical conditions including HTN, HLD, hypothyroidism, most recent A1c 6.3% on 02/12/2020 . Current antihyperglycemic regimen: Ozempic 0.25mg  weekly o Will apply for PAP Ozempic 2021-submitted to company on 10/30/74 (application pending, samples provided today) o Patient tolerating well-->continue current dose . Denies hypoglycemic symptoms,Denies hyperglycemic symptoms . Current meal patterns: o Breakfast: eggs/protein/coffee o Lunch: sandwiches, incorporates veggie o Supper: chicken/protein, vegetable side, light carb o Snacks: veggies/fruit o Drinks: avoids sugary drinks, mostly intakes water/coffee/tea . Current exercise: light walking . Current blood glucose readings: 90-110s fasting . Cardiovascular risk reduction: o Current hypertensive regimen: olmesartan, amlodipine, ismo, hydralazine (encouraged patient to call in refills today, goal BP <130/80) o Current hyperlipidemia regimen: statin intolerant, discusses risk/benefits with patient, no statins at this time o Current antiplatelet regimen: ASA 81  Pharmacist Clinical Goal(s):  [name] Over the next 90 days, patient will work with PharmD and primary care provider to address needs related to optimization of diabetes management.  Interventions: . Comprehensive medication review performed, medication list updated in electronic medical record . Reviewed & discussed  the following diabetes-related information with patient: Follow ADA recommended "diabetes-friendly" diet  (reviewed healthy snack/food options) o Discussed GLP-1 injection technique o Reviewed medication purpose/side effects-->patient denies adverse event  Patient Self Care Activities:  . Patient will check blood glucose 3x weekly in the AM , document, and provide at future appointments . Patient will focus on medication adherence . Patient will take medications as prescribed . Patient will contact provider with any episodes of hypoglycemia . Patient will report any questions or concerns to provider   Please see past updates related to this goal by clicking on the "Past Updates" button in the selected goal        Plan:   The care management team will reach out to the patient again over the next 30-45 days.   Barb Merino, RN, BSN, CCM Care Management Coordinator [facility]  Direct Phone: [phone]

## 2019-07-09 ENCOUNTER — Other Ambulatory Visit: Payer: Self-pay

## 2019-07-09 MED ORDER — OLMESARTAN MEDOXOMIL 40 MG PO TABS: 40.0000 mg | ORAL_TABLET | Freq: Every day | ORAL | 2 refills | Status: DC

## 2019-07-13 ENCOUNTER — Observation Stay (HOSPITAL_COMMUNITY)
Admission: AD | Admit: 2019-07-13 | Discharge: 2019-07-14 | Disposition: A | Discharge status: Home / Self Care | Payer: Medicare PPO | Source: Ambulatory Visit | Attending: Cardiovascular Disease | Admitting: Cardiovascular Disease

## 2019-07-13 DIAGNOSIS — R531 Weakness: Secondary | ICD-10-CM | POA: Diagnosis not present

## 2019-07-13 DIAGNOSIS — I2510 Atherosclerotic heart disease of native coronary artery without angina pectoris: Secondary | ICD-10-CM | POA: Diagnosis not present

## 2019-07-13 DIAGNOSIS — Z20822 Contact with and (suspected) exposure to covid-19: Secondary | ICD-10-CM | POA: Insufficient documentation

## 2019-07-13 DIAGNOSIS — E119 Type 2 diabetes mellitus without complications: Secondary | ICD-10-CM | POA: Diagnosis not present

## 2019-07-13 DIAGNOSIS — Z888 Allergy status to other drugs, medicaments and biological substances status: Secondary | ICD-10-CM | POA: Diagnosis not present

## 2019-07-13 DIAGNOSIS — R001 Bradycardia, unspecified: Secondary | ICD-10-CM | POA: Insufficient documentation

## 2019-07-13 DIAGNOSIS — E039 Hypothyroidism, unspecified: Secondary | ICD-10-CM | POA: Diagnosis not present

## 2019-07-13 DIAGNOSIS — Z794 Long term (current) use of insulin: Secondary | ICD-10-CM | POA: Diagnosis not present

## 2019-07-13 DIAGNOSIS — Z7982 Long term (current) use of aspirin: Secondary | ICD-10-CM | POA: Insufficient documentation

## 2019-07-13 DIAGNOSIS — I249 Acute ischemic heart disease, unspecified: Secondary | ICD-10-CM | POA: Diagnosis present

## 2019-07-13 DIAGNOSIS — Z79899 Other long term (current) drug therapy: Secondary | ICD-10-CM | POA: Insufficient documentation

## 2019-07-13 DIAGNOSIS — I10 Essential (primary) hypertension: Secondary | ICD-10-CM | POA: Diagnosis present

## 2019-07-13 DIAGNOSIS — E034 Atrophy of thyroid (acquired): Secondary | ICD-10-CM | POA: Diagnosis not present

## 2019-07-13 DIAGNOSIS — E7849 Other hyperlipidemia: Secondary | ICD-10-CM | POA: Diagnosis not present

## 2019-07-13 DIAGNOSIS — R072 Precordial pain: Secondary | ICD-10-CM | POA: Diagnosis not present

## 2019-07-13 DIAGNOSIS — E785 Hyperlipidemia, unspecified: Secondary | ICD-10-CM | POA: Diagnosis not present

## 2019-07-13 DIAGNOSIS — R0789 Other chest pain: Secondary | ICD-10-CM | POA: Diagnosis present

## 2019-07-13 LAB — COMPREHENSIVE METABOLIC PANEL
ALT: 22 U/L (ref 0–44)
AST: 43 U/L — ABNORMAL HIGH (ref 15–41)
Albumin: 4.1 g/dL (ref 3.5–5.0)
Alkaline Phosphatase: 45 U/L (ref 38–126)
Anion gap: 8 (ref 5–15)
BUN: 13 mg/dL (ref 8–23)
CO2: 25 mmol/L (ref 22–32)
Calcium: 10.2 mg/dL (ref 8.9–10.3)
Chloride: 109 mmol/L (ref 98–111)
Creatinine, Ser: 1.2 mg/dL — ABNORMAL HIGH (ref 0.44–1.00)
GFR calc Af Amer: 52 mL/min — ABNORMAL LOW (ref >60)
GFR calc non Af Amer: 44 mL/min — ABNORMAL LOW (ref >60)
Glucose, Bld: 126 mg/dL — ABNORMAL HIGH (ref 70–99)
Potassium: 4.8 mmol/L (ref 3.5–5.1)
Sodium: 142 mmol/L (ref 135–145)
Total Bilirubin: 0.5 mg/dL (ref 0.3–1.2)
Total Protein: 6.3 g/dL — ABNORMAL LOW (ref 6.5–8.1)

## 2019-07-13 LAB — PROTIME-INR
INR: 1 (ref 0.8–1.2)
Prothrombin Time: 12.8 seconds (ref 11.4–15.2)

## 2019-07-13 LAB — CBC WITH DIFFERENTIAL/PLATELET
Abs Immature Granulocytes: 0.01 10*3/uL (ref 0.00–0.07)
Basophils Absolute: 0 10*3/uL (ref 0.0–0.1)
Basophils Relative: 1 %
Eosinophils Absolute: 0.1 10*3/uL (ref 0.0–0.5)
Eosinophils Relative: 2 %
HCT: 37.8 % (ref 36.0–46.0)
Hemoglobin: 12.3 g/dL (ref 12.0–15.0)
Immature Granulocytes: 0 %
Lymphocytes Relative: 56 %
Lymphs Abs: 2.2 10*3/uL (ref 0.7–4.0)
MCH: 30.7 pg (ref 26.0–34.0)
MCHC: 32.5 g/dL (ref 30.0–36.0)
MCV: 94.3 fL (ref 80.0–100.0)
Monocytes Absolute: 0.3 10*3/uL (ref 0.1–1.0)
Monocytes Relative: 9 %
Neutro Abs: 1.2 10*3/uL — ABNORMAL LOW (ref 1.7–7.7)
Neutrophils Relative %: 32 %
Platelets: 273 10*3/uL (ref 150–400)
RBC: 4.01 MIL/uL (ref 3.87–5.11)
RDW: 12.2 % (ref 11.5–15.5)
WBC: 3.9 10*3/uL — ABNORMAL LOW (ref 4.0–10.5)
nRBC: 0 % (ref 0.0–0.2)

## 2019-07-13 LAB — TROPONIN I (HIGH SENSITIVITY): Troponin I (High Sensitivity): 7 ng/L (ref <18)

## 2019-07-13 LAB — APTT: aPTT: 31 seconds (ref 24–36)

## 2019-07-13 MED ORDER — MAGNESIUM 250 MG PO TABS: 250.0000 mg | ORAL_TABLET | Freq: Every day | ORAL | Status: DC

## 2019-07-13 MED ORDER — HEPARIN BOLUS VIA INFUSION
4000.0000 [IU] | Freq: Once | INTRAVENOUS | Status: AC
  Administered 2019-07-13: 4000 [IU] via INTRAVENOUS
  Filled 2019-07-13: qty 4000

## 2019-07-13 MED ORDER — CALCIUM GLUCONATE 500 MG PO TABS
1.0000 | ORAL_TABLET | Freq: Every day | ORAL | Status: DC
  Filled 2019-07-13: qty 1

## 2019-07-13 MED ORDER — CALCIUM CITRATE 950 (200 CA) MG PO TABS
100.0000 mg | ORAL_TABLET | Freq: Every day | ORAL | Status: DC
  Administered 2019-07-14: 100 mg via ORAL
  Filled 2019-07-13: qty 1

## 2019-07-13 MED ORDER — POTASSIUM CHLORIDE ER 10 MEQ PO TBCR
10.0000 meq | EXTENDED_RELEASE_TABLET | Freq: Every day | ORAL | Status: DC
  Administered 2019-07-14: 10 meq via ORAL
  Filled 2019-07-13 (×2): qty 1

## 2019-07-13 MED ORDER — LEVOTHYROXINE SODIUM 25 MCG PO TABS
125.0000 ug | ORAL_TABLET | Freq: Every day | ORAL | Status: DC
  Administered 2019-07-14: 125 ug via ORAL
  Filled 2019-07-13: qty 1

## 2019-07-13 MED ORDER — ASPIRIN EC 81 MG PO TBEC
81.0000 mg | DELAYED_RELEASE_TABLET | Freq: Every day | ORAL | Status: DC
  Administered 2019-07-14: 81 mg via ORAL
  Filled 2019-07-13: qty 1

## 2019-07-13 MED ORDER — B COMPLEX PO TABS: 1.0000 | ORAL_TABLET | Freq: Every day | ORAL | Status: DC

## 2019-07-13 MED ORDER — HEPARIN (PORCINE) 25000 UT/250ML-% IV SOLN
600.0000 [IU]/h | INTRAVENOUS | Status: DC
  Administered 2019-07-13: 800 [IU]/h via INTRAVENOUS
  Filled 2019-07-13: qty 250

## 2019-07-13 MED ORDER — ISOSORBIDE MONONITRATE ER 60 MG PO TB24
60.0000 mg | ORAL_TABLET | Freq: Every day | ORAL | Status: DC
  Administered 2019-07-13 – 2019-07-14 (×2): 60 mg via ORAL
  Filled 2019-07-13 (×2): qty 1

## 2019-07-13 MED ORDER — BRIMONIDINE TARTRATE 0.15 % OP SOLN
1.0000 [drp] | Freq: Three times a day (TID) | OPHTHALMIC | Status: DC
  Administered 2019-07-13 – 2019-07-14 (×3): 1 [drp] via OPHTHALMIC
  Filled 2019-07-13: qty 5

## 2019-07-13 MED ORDER — ADULT MULTIVITAMIN W/MINERALS CH
1.0000 | ORAL_TABLET | Freq: Every day | ORAL | Status: DC
  Administered 2019-07-14: 1 via ORAL
  Filled 2019-07-13: qty 1

## 2019-07-13 MED ORDER — MAGNESIUM OXIDE 400 (241.3 MG) MG PO TABS
200.0000 mg | ORAL_TABLET | Freq: Every day | ORAL | Status: DC
  Administered 2019-07-13: 200 mg via ORAL
  Filled 2019-07-13: qty 1

## 2019-07-13 MED ORDER — SODIUM CHLORIDE 0.9% FLUSH: 3.0000 mL | INTRAVENOUS | Status: DC | PRN

## 2019-07-13 MED ORDER — IRBESARTAN 150 MG PO TABS
150.0000 mg | ORAL_TABLET | Freq: Every day | ORAL | Status: DC
  Administered 2019-07-14: 150 mg via ORAL
  Filled 2019-07-13: qty 1

## 2019-07-13 MED ORDER — NITROGLYCERIN 0.4 MG SL SUBL: 0.4000 mg | SUBLINGUAL_TABLET | SUBLINGUAL | Status: DC | PRN

## 2019-07-13 MED ORDER — CILOSTAZOL 100 MG PO TABS
100.0000 mg | ORAL_TABLET | Freq: Every day | ORAL | Status: DC
  Administered 2019-07-14: 100 mg via ORAL
  Filled 2019-07-13: qty 1

## 2019-07-13 MED ORDER — HYDRALAZINE HCL 10 MG PO TABS
10.0000 mg | ORAL_TABLET | Freq: Three times a day (TID) | ORAL | Status: DC
  Administered 2019-07-13 – 2019-07-14 (×3): 10 mg via ORAL
  Filled 2019-07-13 (×3): qty 1

## 2019-07-13 MED ORDER — ALBUTEROL SULFATE (2.5 MG/3ML) 0.083% IN NEBU: 2.5000 mg | INHALATION_SOLUTION | Freq: Four times a day (QID) | RESPIRATORY_TRACT | Status: DC | PRN

## 2019-07-13 MED ORDER — SODIUM CHLORIDE 0.9% FLUSH
3.0000 mL | Freq: Two times a day (BID) | INTRAVENOUS | Status: DC
  Administered 2019-07-13: 3 mL via INTRAVENOUS

## 2019-07-13 MED ORDER — ONDANSETRON HCL 4 MG/2ML IJ SOLN: 4.0000 mg | Freq: Four times a day (QID) | INTRAMUSCULAR | Status: DC | PRN

## 2019-07-13 MED ORDER — SODIUM CHLORIDE 0.9 % IV SOLN: 250.0000 mL | INTRAVENOUS | Status: DC | PRN

## 2019-07-13 MED ORDER — FUROSEMIDE 20 MG PO TABS
20.0000 mg | ORAL_TABLET | Freq: Every day | ORAL | Status: DC
  Administered 2019-07-14: 20 mg via ORAL
  Filled 2019-07-13: qty 1

## 2019-07-13 NOTE — Progress Notes (Addendum)
ANTICOAGULATION CONSULT NOTE - Initial Consult  Pharmacy Consult for IV Heparin Indication: chest pain/ACS  Allergies  Allergen Reactions  . Atorvastatin Other (See Comments) and Swelling    unknown  . Clopidogrel   . Metoprolol Other (See Comments)    Slows heart rate to low  . Metoprolol Tartrate Swelling  . Pregabalin Swelling  . Rosuvastatin Calcium Other (See Comments)    unknown    Patient Measurements: Weight: 69.5 kg (153 lb 3.2 oz)  Height (per RN): 63 inches Heparin Dosing Weight: 66.7 kg  Vital Signs: Temp: 100.7 F (38.2 C) (04/19 1947) Temp Source: Oral (04/19 1947) BP: 141/76 (04/19 1947) Pulse Rate: 64 (04/19 1947)  Labs: Recent Labs    07/13/19 2036  HGB 12.3  HCT 37.8  PLT 273  APTT 31  LABPROT 12.8  INR 1.0    CrCl cannot be calculated (Patient's most recent lab result is older than the maximum 21 days allowed.).   Medical History: Past Medical History:  Diagnosis Date  . Benign hypertensive renal disease   . CKD stage 2 due to type 2 diabetes mellitus (HCC)   . Hypertension   . Hypothyroidism   . Obesity   . OSA on CPAP   . Pure hypercholesterolemia   . PVD (peripheral vascular disease) (HCC)   . Vitamin D deficiency     Assessment: 74 yr old female with PMH of HTN, hypothyroidism, HLT, DM2 and CAD, direct admit to 6E with CP, ACS. Pharmacy is consulted to dose heparin for ACS/STEMI. Per pt, she is on no anticoagulants at home.  Latest CBC (06/02/19) WNL; CBC WNL this admission, as well; INR 1.0, aPTT 31 sec  Goal of Therapy:  Heparin level 0.3-0.7 units/ml Monitor platelets by anticoagulation protocol: Yes   Plan:  Heparin 4000 units IV bolus, followed by heparin infusion at 800 units/hr Check 8-hr heparin level Monitor daily heparin level, CBC Monitor for signs/symptoms of bleeding  Vicki Mallet, PharmD, BCPS, [facility] Clinical Pharmacist 07/13/2019,8:21 PM

## 2019-07-13 NOTE — H&P (Signed)
Referring Physician: Glendale Chard, MD  [name] is an 74 y.o. female.                       Chief Complaint: Chest pain  HPI: 74 years old black female with PMH of Hypertension, hypothyroidism, hyperlipidemia, diet controlled type 2 DM and CAD has left precordial and across the chest area sharp pian radiating to left shoulder and arm and also under left breast and mid back area for 2 weeks. She denies fever and cough and cold.  Past Medical History:  Diagnosis Date  . Benign hypertensive renal disease   . CKD stage 2 due to type 2 diabetes mellitus ([place])   . Hypertension   . Hypothyroidism   . Obesity   . OSA on CPAP   . Pure hypercholesterolemia   . PVD (peripheral vascular disease) ([place])   . Vitamin D deficiency       Past Surgical History:  Procedure Laterality Date  . ABDOMINAL HYSTERECTOMY    . BREAST CYST EXCISION Right over 20 yrs  . broken leg and displaced ankle    . DILATION AND CURETTAGE OF UTERUS    . LEFT HEART CATH AND CORONARY ANGIOGRAPHY N/A 06/13/2017   Procedure: LEFT HEART CATH AND CORONARY ANGIOGRAPHY;  Surgeon: Dixie Dials, MD;  Location: [facility];  Service: Cardiovascular;  Laterality: N/A;  . right and left rotator cuff    . THYROIDECTOMY      Family History  Problem Relation Age of Onset  . Alzheimer's disease Mother   . Aneurysm Father   . Cancer Brother    Social History:  reports that she has never smoked. She has never used smokeless tobacco. She reports that she does not drink alcohol or use drugs.  Allergies:  Allergies  Allergen Reactions  . Atorvastatin Other (See Comments) and Swelling    unknown  . Clopidogrel   . Metoprolol Other (See Comments)    Slows heart rate to low  . Metoprolol Tartrate Swelling  . Pregabalin Swelling  . Rosuvastatin Calcium Other (See Comments)    unknown    Medications Prior to Admission  Medication Sig Dispense Refill  . albuterol (PROVENTIL HFA;VENTOLIN HFA) 108 (90 Base)  MCG/ACT inhaler Inhale 2 puffs into the lungs every 6 (six) hours as needed for wheezing or shortness of breath. 1 Inhaler 2  . amLODipine (NORVASC) 5 MG tablet     . aspirin 81 MG tablet Take 81 mg by mouth daily.    [name] b complex vitamins tablet Take 1 tablet by mouth daily.    . brimonidine (ALPHAGAN P) 0.1 % SOLN Place 1 drop into both eyes every 8 (eight) hours.    . calcium gluconate 500 MG tablet Take 1 tablet by mouth daily.     . cilostazol (PLETAL) 100 MG tablet Take 100 mg by mouth daily.   3  . ELDERBERRY PO Take by mouth. 1 teaspoon 3 times per day    . furosemide (LASIX) 20 MG tablet Take 1 tablet (20 mg total) by mouth daily. 90 tablet 2  . hydrALAZINE (APRESOLINE) 10 MG tablet Take 1 tablet (10 mg total) by mouth every 8 (eight) hours. (Patient taking differently: Take 10 mg by mouth 3 (three) times daily. ) 90 tablet 3  . isosorbide mononitrate (IMDUR) 60 MG 24 hr tablet Take 1 tablet (60 mg total) by mouth daily. 180 tablet 1  . levothyroxine (SYNTHROID) 125 MCG tablet TAKE  1 TABLET DAILY ON MONDAY THRU FRIDAY 65 tablet 3  . Magnesium 250 MG TABS Take 1 tablet (250 mg total) by mouth at bedtime. 90 tablet 2  . Multiple Vitamins-Minerals (MULTIVITAMIN WITH MINERALS) tablet Take 1 tablet by mouth daily.    . nitroGLYCERIN (NITROSTAT) 0.4 MG SL tablet Place 1 tablet (0.4 mg total) under the tongue every 5 (five) minutes x 3 doses as needed for chest pain. 25 tablet 1  . olmesartan (BENICAR) 40 MG tablet Take 1 tablet (40 mg total) by mouth daily. 90 tablet 2  . OZEMPIC 0.25 or 0.5 MG/DOSE SOPN Patient is taking 0.25 mg weekly  1  . potassium chloride (K-DUR) 10 MEQ tablet Take 10 mEq by mouth once.   3    No results found for this or any previous visit (from the past 48 hour(s)). No results found.  Review Of Systems Constitutional: No fever, chills, weight loss or gain. Eyes: No vision change, wears glasses. No discharge or pain. Ears: No hearing loss, No  tinnitus. Respiratory: No asthma, COPD, pneumonias. Positive shortness of breath. No hemoptysis. Cardiovascular: Positive chest pain, palpitation, leg edema. Gastrointestinal: No nausea, vomiting, diarrhea, constipation. No GI bleed. No hepatitis. Genitourinary: No dysuria, hematuria, kidney stone. No incontinance. Neurological: Positive headache, stroke, no seizures.  Psychiatry: No psych facility admission for anxiety, depression, suicide. No detox. Skin: No rash. Musculoskeletal: Positive joint pain, fibromyalgia, neck pain, back pain. Lymphadenopathy: No lymphadenopathy. Hematology: No anemia or easy bruising.   Blood pressure (!) 141/76, pulse 64, temperature (!) 100.7 F (38.2 C), temperature source Oral, resp. rate 16, weight 69.5 kg, SpO2 97 %. Body mass index is 28.2 kg/m. General appearance: alert, cooperative, appears stated age and no distress Head: Normocephalic, atraumatic. Eyes: Brown eyes, pink conjunctiva, corneas clear. PERRL, EOM's intact. Neck: No adenopathy, no carotid bruit, no JVD, supple, symmetrical, trachea midline and thyroid not enlarged. Resp: Clear to auscultation bilaterally. Cardio: Regular rate and rhythm, S1, S2 normal, II/VI systolic murmur, no click, rub or gallop GI: Soft, non-tender; bowel sounds normal; no organomegaly. Extremities: No edema, cyanosis or clubbing. Skin: Warm and dry.  Neurologic: Alert and oriented X 3, normal strength. Normal coordination and slow gait.  Assessment/Plan Acute coronary syndrome CAD Hypertension Diet controlled type 2 DM Hyperlipidemia Hypothyroidism  Place in observation. NM myocardial stress test in AM.  Time spent: Review of old records, Lab, x-rays, EKG, other cardiac tests, examination, discussion with patient over 70 minutes.  Ricki Rodriguez, MD  07/13/2019, 8:16 PM

## 2019-07-14 ENCOUNTER — Ambulatory Visit: Payer: Self-pay

## 2019-07-14 ENCOUNTER — Observation Stay (HOSPITAL_COMMUNITY): Payer: Medicare PPO

## 2019-07-14 DIAGNOSIS — R531 Weakness: Secondary | ICD-10-CM | POA: Diagnosis not present

## 2019-07-14 DIAGNOSIS — E119 Type 2 diabetes mellitus without complications: Secondary | ICD-10-CM | POA: Diagnosis not present

## 2019-07-14 DIAGNOSIS — I2510 Atherosclerotic heart disease of native coronary artery without angina pectoris: Secondary | ICD-10-CM | POA: Diagnosis not present

## 2019-07-14 DIAGNOSIS — I10 Essential (primary) hypertension: Secondary | ICD-10-CM | POA: Diagnosis not present

## 2019-07-14 DIAGNOSIS — E034 Atrophy of thyroid (acquired): Secondary | ICD-10-CM | POA: Diagnosis not present

## 2019-07-14 DIAGNOSIS — Z79899 Other long term (current) drug therapy: Secondary | ICD-10-CM | POA: Diagnosis not present

## 2019-07-14 DIAGNOSIS — R001 Bradycardia, unspecified: Secondary | ICD-10-CM | POA: Diagnosis not present

## 2019-07-14 DIAGNOSIS — Z7982 Long term (current) use of aspirin: Secondary | ICD-10-CM | POA: Diagnosis not present

## 2019-07-14 DIAGNOSIS — E039 Hypothyroidism, unspecified: Secondary | ICD-10-CM | POA: Diagnosis not present

## 2019-07-14 DIAGNOSIS — E7849 Other hyperlipidemia: Secondary | ICD-10-CM | POA: Diagnosis not present

## 2019-07-14 DIAGNOSIS — Z20822 Contact with and (suspected) exposure to covid-19: Secondary | ICD-10-CM | POA: Diagnosis not present

## 2019-07-14 DIAGNOSIS — R072 Precordial pain: Secondary | ICD-10-CM | POA: Diagnosis not present

## 2019-07-14 DIAGNOSIS — E1122 Type 2 diabetes mellitus with diabetic chronic kidney disease: Secondary | ICD-10-CM

## 2019-07-14 LAB — LIPID PANEL
Cholesterol: 199 mg/dL (ref 0–200)
HDL: 58 mg/dL (ref >40)
LDL Cholesterol: 134 mg/dL — ABNORMAL HIGH (ref 0–99)
Total CHOL/HDL Ratio: 3.4 RATIO
Triglycerides: 33 mg/dL (ref <150)
VLDL: 7 mg/dL (ref 0–40)

## 2019-07-14 LAB — CBC
HCT: 34.9 % — ABNORMAL LOW (ref 36.0–46.0)
Hemoglobin: 11.4 g/dL — ABNORMAL LOW (ref 12.0–15.0)
MCH: 30.4 pg (ref 26.0–34.0)
MCHC: 32.7 g/dL (ref 30.0–36.0)
MCV: 93.1 fL (ref 80.0–100.0)
Platelets: 226 10*3/uL (ref 150–400)
RBC: 3.75 MIL/uL — ABNORMAL LOW (ref 3.87–5.11)
RDW: 12.3 % (ref 11.5–15.5)
WBC: 4.3 10*3/uL (ref 4.0–10.5)
nRBC: 0 % (ref 0.0–0.2)

## 2019-07-14 LAB — BASIC METABOLIC PANEL
Anion gap: 9 (ref 5–15)
BUN: 12 mg/dL (ref 8–23)
CO2: 24 mmol/L (ref 22–32)
Calcium: 9.7 mg/dL (ref 8.9–10.3)
Chloride: 108 mmol/L (ref 98–111)
Creatinine, Ser: 1.03 mg/dL — ABNORMAL HIGH (ref 0.44–1.00)
GFR calc Af Amer: >60 mL/min (ref >60)
GFR calc non Af Amer: 54 mL/min — ABNORMAL LOW (ref >60)
Glucose, Bld: 101 mg/dL — ABNORMAL HIGH (ref 70–99)
Potassium: 4 mmol/L (ref 3.5–5.1)
Sodium: 141 mmol/L (ref 135–145)

## 2019-07-14 LAB — PROTIME-INR
INR: 1.1 (ref 0.8–1.2)
Prothrombin Time: 13.6 seconds (ref 11.4–15.2)

## 2019-07-14 LAB — SARS CORONAVIRUS 2 (TAT 6-24 HRS): SARS Coronavirus 2: NEGATIVE

## 2019-07-14 LAB — HEPARIN LEVEL (UNFRACTIONATED)
Heparin Unfractionated: 0.86 IU/mL — ABNORMAL HIGH (ref 0.30–0.70)
Heparin Unfractionated: 0.68 IU/mL (ref 0.30–0.70)

## 2019-07-14 LAB — TROPONIN I (HIGH SENSITIVITY): Troponin I (High Sensitivity): 8 ng/L (ref <18)

## 2019-07-14 MED ORDER — TECHNETIUM TC 99M TETROFOSMIN IV KIT
30.8000 | PACK | Freq: Once | INTRAVENOUS | Status: AC | PRN
  Administered 2019-07-14: 30.8 via INTRAVENOUS

## 2019-07-14 MED ORDER — REGADENOSON 0.4 MG/5ML IV SOLN
INTRAVENOUS | Status: AC
  Administered 2019-07-14: 0.4 mg via INTRAVENOUS
  Filled 2019-07-14: qty 5

## 2019-07-14 MED ORDER — TECHNETIUM TC 99M TETROFOSMIN IV KIT
10.7000 | PACK | Freq: Once | INTRAVENOUS | Status: AC | PRN
  Administered 2019-07-14: 10.7 via INTRAVENOUS

## 2019-07-14 MED ORDER — REGADENOSON 0.4 MG/5ML IV SOLN
0.4000 mg | Freq: Once | INTRAVENOUS | Status: AC
  Filled 2019-07-14: qty 5

## 2019-07-14 MED ORDER — ISOSORBIDE MONONITRATE ER 60 MG PO TB24: 60.0000 mg | ORAL_TABLET | Freq: Every day | ORAL | Status: DC

## 2019-07-14 NOTE — Progress Notes (Signed)
ANTICOAGULATION CONSULT NOTE - Follow Up Consult  Pharmacy Consult for Heparin Indication: chest pain/ACS  Allergies  Allergen Reactions  . Atorvastatin Swelling and Other (See Comments)    Unknown reaction  . Clopidogrel Other (See Comments)    Caused bruising  . Metoprolol Other (See Comments)    Slows heart rate to low  . Metoprolol Tartrate Swelling  . Pregabalin Swelling  . Rosuvastatin Calcium Other (See Comments)    Unknown reaction    Patient Measurements: Height: 5\' 3"  (160 cm) Weight: 68.2 kg (150 lb 4.8 oz) IBW/kg (Calculated) : 52.4 Heparin Dosing Weight: 66.7 kg  Vital Signs: Temp: 98.1 F (36.7 C) (04/20 0554) Temp Source: Oral (04/20 0554) BP: 102/58 (04/20 0554) Pulse Rate: 47 (04/20 0554)  Labs: Recent Labs    07/13/19 2036 07/13/19 2313 07/14/19 0433  HGB 12.3  --  11.4*  HCT 37.8  --  34.9*  PLT 273  --  226  APTT 31  --   --   LABPROT 12.8  --  13.6  INR 1.0  --  1.1  HEPARINUNFRC  --   --  0.86*  CREATININE 1.20*  --   --   TROPONINIHS 7 8  --     Estimated Creatinine Clearance: 38.1 mL/min (A) (by C-G formula based on SCr of 1.2 mg/dL (H)).   Medications:  Infusions:  . sodium chloride    . heparin 800 Units/hr (07/13/19 2204)    Assessment: 74 year old on IV Heparin for ACS.   Heparin level is SUPRA-therapeutic at 0.86 - 8 hours after 4000 unit bolus and rate of 800 units/hr. Hgb is 11.4. Platelets are within normal limits. No signs or symptoms of bleeding per the RN.   Goal of Therapy:  Heparin level 0.3-0.7 units/ml Monitor platelets by anticoagulation protocol: Yes   Plan:  Reduce Heparin to 650 units/hr.  Recheck Heparin level in 8 hours.  Daily Heparin level and CBC while on therapy.   65, PharmD, BCPS, BCCCP Clinical Pharmacist Please refer to [facility] for [facility] Pharmacy numbers 07/14/2019,5:56 AM

## 2019-07-14 NOTE — Chronic Care Management (AMB) (Addendum)
  Chronic Care Management   Inpatient Admit Review Note  07/14/2019 Name: [name] MRN: [id number] DOB: Oct 18, 1945  [name] is a 74 y.o. year old female who is a primary care patient of [name], MD. Cornell Gaber is actively engaged with the embedded care management team in the primary care practice and is being followed by RN Case Manager for assistance with disease management and care coordination needs related to HTN and DMII.   [name] is currently admitted to the hospital for evaluation and treatment of Acute Coronary Syndrome. Current treatment plan is to perform further cardiac testing today, 07/14/19.   Plan: SW collaborated with Medical illustrator regarding patient current disposition. SW collaboration with [facility] Liaison notifying of patient engagement with embedded care management team. CM team will engage patient upon discharge to further assist with care coordination needs.   [name], BSW, CDP Social Worker, Certified Dementia Practitioner [facility] Care Management [phone]

## 2019-07-14 NOTE — Consult Note (Signed)
   [facility] Inpatient Consult   07/14/2019  Lovella Hardie [birth year] [id number]   Patient was alert by Embedded social worker of the patient's hospitalization and screened for [facility]  Care Management services. Patient will have the transition of care call conducted by the primary care provider's office and patient is active with the Embedded RN Chronic Care Coordiantor.   Primary Care Provider:  Dr. [name], [facility] which is an Embedded practice.  Plan: Notification sent to the [facility] Embedded Care Management and made aware of any additional needs.   Please contact for further questions,  Charlesetta Shanks, RN BSN CCM [facility]  [phone] business mobile phone Toll free office [phone]  Fax number: [phone] [email or web address]

## 2019-07-14 NOTE — Discharge Summary (Signed)
Physician Discharge Summary  Patient ID: [name] MRN: [id number] DOB/AGE: 1946/02/26 74 y.o.  Admit date: 07/13/2019 Discharge date: 07/14/2019  Admission Diagnoses: Acute cornary syndrome CAD Hypertnesion Diet controlled type 2 DM Hyperlipidemia Hypothyroidism  Discharge Diagnoses:  Principal Problem:   Acute coronary syndrome (HCC) Active Problems:   Weakness   Essential hypertension   CAD in native artery   Primary hypothyroidism   Hyperlipidemia   Sinus bradycardia  Discharged Condition: good  Hospital Course: 74 years old black female with PMH of HTN, Hypothyroidism, diet controlled type 2 DM, CAD and right leg weakness has left precordial and across the chest area sharp pain radiating to left shoulder and arm and under left breast pain radiating to mid back area x 2 weeks. Her HS-Troponin I levels were normal. She underwent Nuclear stress test which showed no reversible ischemia and EF of 68 %. She has baseline heart rate in 40's and improves to 60's with normal activity. She denies dizziness. 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 She was reminded to avoid heavy duty pull, push or lift and use muscle rub, hot shower and ibuprofen as needed x 1-2 weeks. She will improve her diet to reduce her LDL cholesterol level. She will see me in 1 week and primary care in 1 month.  Consults: cardiology  Significant Diagnostic Studies: labs: Near normal CBC, BMET and Troponin I x 2. Her cholesterol level is elevated at 134 mg. from dietary non-compliance Her TSH was normal 1 month ago.  Treatments: cardiac meds: amlodipine, Aspirin, Isosorbide, hydralazine, SL NTG, magnesium, Olmesartan, furosemide and potassium.  Discharge Exam: Blood pressure (!) 146/88, pulse (!) 52, temperature (!) 97.4 F (36.3 C), temperature source Oral, resp. rate 16, height 5\' 3"  (1.6 m), weight 68.2 kg, SpO2 100 %. General  appearance: alert, cooperative and appears stated age. Head: Normocephalic, atraumatic. Eyes: Brown eyes, pink conjunctiva, corneas clear. PERRL, EOM's intact.  Neck: No adenopathy, no carotid bruit, no JVD, supple, symmetrical, trachea midline and thyroid not enlarged. Resp: Clear to auscultation bilaterally. Cardio: Regular rate and rhythm, S1, S2 normal, II/VI systolic murmur, no click, rub or gallop. GI: Soft, non-tender; bowel sounds normal; no organomegaly. Extremities: No edema, cyanosis or clubbing. Skin: Warm and dry.  Neurologic: Alert and oriented X 3, normal strength and tone. Normal coordination and slow gait.  Disposition: Discharge disposition: 01-Home or Self Care        Allergies as of 07/14/2019      Reactions   Atorvastatin Swelling, Other (See Comments)   Unknown reaction   Clopidogrel Other (See Comments)   Caused bruising   Metoprolol Other (See Comments)   Slows heart rate to low   Metoprolol Tartrate Swelling   Pregabalin Swelling   Rosuvastatin Calcium Other (See Comments)   Unknown reaction      Medication List    TAKE these medications   ADVIL PM PO Take 1 tablet by mouth at bedtime as needed (sleep).   albuterol 108 (90 Base) MCG/ACT inhaler Commonly known as: VENTOLIN HFA Inhale 2 puffs into the lungs every 6 (six) hours as needed for wheezing or shortness of breath.   amLODipine 5 MG tablet Commonly known as: NORVASC Take 5 mg by mouth at bedtime.   aspirin EC 81 MG tablet Take 81 mg by mouth daily.   b complex vitamins tablet Take 1 tablet by mouth daily with lunch.   CALCIUM + D3  PO Take 1 tablet by mouth at bedtime.   cilostazol 100 MG tablet Commonly known as: PLETAL Take 100 mg by mouth daily.   furosemide 20 MG tablet Commonly known as: LASIX Take 1 tablet (20 mg total) by mouth daily.   hydrALAZINE 10 MG tablet Commonly known as: APRESOLINE Take 1 tablet (10 mg total) by mouth every 8 (eight) hours. What changed:  when to take this   isosorbide mononitrate 60 MG 24 hr tablet Commonly known as: IMDUR Take 1 tablet (60 mg total) by mouth at bedtime.   levothyroxine 125 MCG tablet Commonly known as: SYNTHROID TAKE 1 TABLET DAILY ON MONDAY THRU FRIDAY What changed: See the new instructions.   Magnesium 250 MG Tabs Take 1 tablet (250 mg total) by mouth at bedtime.   multivitamin with minerals tablet Take 1 tablet by mouth daily with lunch.   nitroGLYCERIN 0.4 MG SL tablet Commonly known as: NITROSTAT Place 1 tablet (0.4 mg total) under the tongue every 5 (five) minutes x 3 doses as needed for chest pain.   olmesartan 40 MG tablet Commonly known as: BENICAR Take 1 tablet (40 mg total) by mouth daily.   OZEMPIC (0.25 OR 0.5 MG/DOSE) [place] Inject 0.25 mg into the skin every Thursday.   potassium chloride 10 MEQ tablet Commonly known as: KLOR-CON Take 10 mEq by mouth 2 (two) times daily with breakfast and lunch.   VITAMIN D3 PO Take 1 tablet by mouth daily.      Follow-up Information    Dixie Dials, MD. Schedule an appointment as soon as possible for a visit in 1 week(s).   Specialty: Cardiology Contact information: [place] [address] [phone]        Glendale Chard, MD. Schedule an appointment as soon as possible for a visit in 1 month(s).   Specialty: Internal Medicine Contact information: [address] [place] [address] [phone]           Time spent: Review of old chart, current chart, lab, x-ray, cardiac tests and discussion with patient over 60 minutes.  Signed: Birdie Riddle 07/14/2019, 6:12 PM

## 2019-07-14 NOTE — Progress Notes (Signed)
ANTICOAGULATION CONSULT NOTE - Follow Up Consult  Pharmacy Consult for Heparin Indication: chest pain/ACS  Allergies  Allergen Reactions  . Atorvastatin Swelling and Other (See Comments)    Unknown reaction  . Clopidogrel Other (See Comments)    Caused bruising  . Metoprolol Other (See Comments)    Slows heart rate to low  . Metoprolol Tartrate Swelling  . Pregabalin Swelling  . Rosuvastatin Calcium Other (See Comments)    Unknown reaction    Patient Measurements: Height: 5\' 3"  (160 cm) Weight: 68.2 kg (150 lb 4.8 oz) IBW/kg (Calculated) : 52.4 Heparin Dosing Weight: 66.7 kg  Vital Signs: Temp: 97.4 F (36.3 C) (04/20 1321) Temp Source: Oral (04/20 1321) BP: 146/88 (04/20 1534) Pulse Rate: 52 (04/20 1321)  Labs: Recent Labs    07/13/19 2036 07/13/19 2313 07/14/19 0433 07/14/19 1443  HGB 12.3  --  11.4*  --   HCT 37.8  --  34.9*  --   PLT 273  --  226  --   APTT 31  --   --   --   LABPROT 12.8  --  13.6  --   INR 1.0  --  1.1  --   HEPARINUNFRC  --   --  0.86* 0.68  CREATININE 1.20*  --  1.03*  --   TROPONINIHS 7 8  --   --     Estimated Creatinine Clearance: 44.4 mL/min (A) (by C-G formula based on SCr of 1.03 mg/dL (H)).   Medications:  Infusions:  . sodium chloride    . heparin 650 Units/hr (07/14/19 0606)    Assessment: 74 year old on IV Heparin for ACS.   Heparin level is therapeutic at 0.68 on the upper end of goal - will decrease drip rate to target more middle range. Hgb is 11.4. Platelets are within normal limits. No signs or symptoms of bleeding and no problems with infusion per RN.   Goal of Therapy:  Heparin level 0.3-0.7 units/ml Monitor platelets by anticoagulation protocol: Yes   Plan:  Reduce heparin drip to 600 units/hr Daily heparin level and CBC while on therapy Monitor for s/sx of bleeding  Thank you for involving pharmacy in this patient's care.  65, PharmD, BCPS Clinical Pharmacist Clinical phone for  07/14/2019 until 10p is [phone] 07/14/2019 3:51 PM  **Pharmacist phone directory can be found on [email or web address] listed under [facility]**

## 2019-07-14 NOTE — Care Management Obs Status (Signed)
MEDICARE OBSERVATION STATUS NOTIFICATION   Patient Details  Name: [name] MRN: [id number] Date of Birth: 10-May-1945   Medicare Observation Status Notification Given:  Yes    Antony Haste, RN 07/14/2019, 6:02 PM

## 2019-07-15 ENCOUNTER — Ambulatory Visit: Payer: Self-pay

## 2019-07-15 ENCOUNTER — Telehealth: Payer: Self-pay

## 2019-07-15 DIAGNOSIS — E1122 Type 2 diabetes mellitus with diabetic chronic kidney disease: Secondary | ICD-10-CM

## 2019-07-15 DIAGNOSIS — I10 Essential (primary) hypertension: Secondary | ICD-10-CM

## 2019-07-15 DIAGNOSIS — N182 Chronic kidney disease, stage 2 (mild): Secondary | ICD-10-CM

## 2019-07-15 DIAGNOSIS — I2510 Atherosclerotic heart disease of native coronary artery without angina pectoris: Secondary | ICD-10-CM

## 2019-07-15 NOTE — Telephone Encounter (Signed)
Transition Care Management Follow-up Telephone Call  Date of discharge and from where: 07/14/19 from [facility]  How have you been since you were released from the hospital? ok  Any questions or concerns? No   Items Reviewed:  Did the pt receive and understand the discharge instructions provided? Yes   Medications obtained and verified? No   Any new allergies since your discharge? No   Dietary orders reviewed? Yes  Do you have support at home? Yes   Other (ie: DME, Home Health, etc) n/a  Functional Questionnaire: (I = Independent and D = Dependent) ADL's: I  Bathing/Dressing- I   Meal Prep- I  Eating- I  Maintaining continence- I  Transferring/Ambulation- I  Managing Meds- I   Follow up appointments reviewed:    PCP Hospital f/u appt confirmed? Yes  Scheduled to see Dr. Allyne Gee on 07/20/2019 @ 2:15..  Are transportation arrangements needed? No   If their condition worsens, is the pt aware to call  their PCP or go to the ED? Yes  Was the patient provided with contact information for the PCP's office or ED? Yes  Was the pt encouraged to call back with questions or concerns? Yes

## 2019-07-16 NOTE — Patient Instructions (Signed)
Social Worker Visit Information  Goals we discussed today:  Goals Addressed            This Visit's Progress   . Collaborate with RN Care Manager to perform approrpiate assessments to determine care coordination needs following recent IP admission       CARE PLAN ENTRY (see longitudinal plan of care for additional care plan information)  Current Barriers:  . Recent IP admission related to chest pain . History of CAD, HTN, and DM II  Social Work Clinical Goal(s):  [name] Over the next 30 days the patient will work with care management team and primary care provider to develop a plan to optimize management of chronic health conditions as evidenced by no ED visits over the next 60 days  CCM SW Interventions: Completed 07/15/2019 with the patient . Inter-disciplinary care team collaboration (see longitudinal plan of care) . Successful outbound call placed to the patient following recent IP discharge . Determined the patient went to the hospital due to ongoing pain in her chest o "My cardiologist has spoken with me for about 10 years saying I may need a pacemaker and I thought it was time to get one because of the pain" . Discussed the patient has experienced a pain in the "left side of the chest that goes deep to the back and down the left arm" . Chart reviewed to note cardiac workup did not show significant findings and no pacemaker intervention was needed o "they said everything looked okay and I probably pulled a muscle in my chest since it has been going on so long" . Determined the patient has experienced this pain approximately 3 weeks o Discussed the patient watched a two year old little girl and this may have been the cause of a pulled muscle o Reviewed recommendations to take Aleve or Advil as needed to ease pain . Discussed patient plans to follow up with cardiologist and primary provider over the next week . Assisted in scheduling follow up appointment to see Dr. [name] on  07/20/19 . Advised the patient to contact her primary care team with acute needs prior to upcomming appointment . Collaboration with Dr. Allyne Gee and RN Care Manager, Delsa Sale, regarding patient current disposition  Patient Self Care Activities:  . Patient verbalizes understanding of plan to contact primary care team for acute needs prior to appointment on 4/26 . Self administers medications as prescribed . Attends all scheduled provider appointments . Performs ADL's independently . Performs IADL's independently . Calls provider office for new concerns or questions  Initial goal documentation         Follow Up Plan: SW will follow up with patient by phone over the next month   [name], BSW, CDP Social Worker, Certified Dementia Practitioner [facility] [phone]

## 2019-07-16 NOTE — Chronic Care Management (AMB) (Signed)
Chronic Care Management    Social Work Follow Up Note  07/15/2019 Name: [name] MRN: [id number] DOB: 1945/07/31  [name] is a 74 y.o. year old female who is a primary care patient of Glendale Chard, MD. The CCM team was consulted for assistance with care coordination.   Review of patient status, including review of consultants reports, other relevant assessments, and collaboration with appropriate care team members and the patient's provider was performed as part of comprehensive patient evaluation and provision of chronic care management services.    SDOH (Social Determinants of Health) assessments performed: No    Outpatient Encounter Medications as of 07/15/2019  Medication Sig Note  . albuterol (PROVENTIL HFA;VENTOLIN HFA) 108 (90 Base) MCG/ACT inhaler Inhale 2 puffs into the lungs every 6 (six) hours as needed for wheezing or shortness of breath.   [name] amLODipine (NORVASC) 5 MG tablet Take 5 mg by mouth at bedtime.    [name] aspirin EC 81 MG tablet Take 81 mg by mouth daily.   [name] b complex vitamins tablet Take 1 tablet by mouth daily with lunch.    . Calcium Carb-Cholecalciferol (CALCIUM + D3 PO) Take 1 tablet by mouth at bedtime.   . Cholecalciferol (VITAMIN D3 PO) Take 1 tablet by mouth daily.   . cilostazol (PLETAL) 100 MG tablet Take 100 mg by mouth daily.    . furosemide (LASIX) 20 MG tablet Take 1 tablet (20 mg total) by mouth daily.   . hydrALAZINE (APRESOLINE) 10 MG tablet Take 1 tablet (10 mg total) by mouth every 8 (eight) hours. (Patient taking differently: Take 10 mg by mouth 3 (three) times daily with meals. )   . Ibuprofen-diphenhydrAMINE Cit (ADVIL PM PO) Take 1 tablet by mouth at bedtime as needed (sleep).   . isosorbide mononitrate (IMDUR) 60 MG 24 hr tablet Take 1 tablet (60 mg total) by mouth at bedtime.   [name] levothyroxine (SYNTHROID) 125 MCG tablet TAKE 1 TABLET DAILY ON MONDAY THRU FRIDAY (Patient taking differently: Take 125 mcg by mouth See admin  instructions. Take one tablet (125 mcg) by mouth on Monday thru Friday before breakfast (skip Saturday and Sunday))   . Magnesium 250 MG TABS Take 1 tablet (250 mg total) by mouth at bedtime.   . Multiple Vitamins-Minerals (MULTIVITAMIN WITH MINERALS) tablet Take 1 tablet by mouth daily with lunch.    . nitroGLYCERIN (NITROSTAT) 0.4 MG SL tablet Place 1 tablet (0.4 mg total) under the tongue every 5 (five) minutes x 3 doses as needed for chest pain. 07/13/2019: 1 tablet today  . olmesartan (BENICAR) 40 MG tablet Take 1 tablet (40 mg total) by mouth daily.   . potassium chloride (K-DUR) 10 MEQ tablet Take 10 mEq by mouth 2 (two) times daily with breakfast and lunch.    . Semaglutide (OZEMPIC, 0.25 OR 0.5 MG/DOSE, [place]) Inject 0.25 mg into the skin every Thursday.    No facility-administered encounter medications on file as of 07/15/2019.     Goals Addressed            This Visit's Progress   . Collaborate with RN Care Manager to perform approrpiate assessments to determine care coordination needs following recent IP admission       CARE PLAN ENTRY (see longitudinal plan of care for additional care plan information)  Current Barriers:  . Recent IP admission related to chest pain . History of CAD, HTN, and DM II  Social Work Clinical Goal(s):  [name] Over the next 30  days the patient will work with care management team and primary care provider to develop a plan to optimize management of chronic health conditions as evidenced by no ED visits over the next 60 days  CCM SW Interventions: Completed 07/15/2019 with the patient . Inter-disciplinary care team collaboration (see longitudinal plan of care) . Successful outbound call placed to the patient following recent IP discharge . Determined the patient went to the hospital due to ongoing pain in her chest o "My cardiologist has spoken with me for about 10 years saying I may need a pacemaker and I thought it was time to get one because of the  pain" . Discussed the patient has experienced a pain in the "left side of the chest that goes deep to the back and down the left arm" . Chart reviewed to note cardiac workup did not show significant findings and no pacemaker intervention was needed o "they said everything looked okay and I probably pulled a muscle in my chest since it has been going on so long" . Determined the patient has experienced this pain approximately 3 weeks o Discussed the patient watched a two year old little girl and this may have been the cause of a pulled muscle o Reviewed recommendations to take Aleve or Advil as needed to ease pain . Discussed patient plans to follow up with cardiologist and primary provider over the next week . Assisted in scheduling follow up appointment to see Dr. [name] on 07/20/19 . Advised the patient to contact her primary care team with acute needs prior to upcomming appointment . Collaboration with Dr. Allyne Gee and RN Care Manager, Delsa Sale, regarding patient current disposition  Patient Self Care Activities:  . Patient verbalizes understanding of plan to contact primary care team for acute needs prior to appointment on 4/26 . Self administers medications as prescribed . Attends all scheduled provider appointments . Performs ADL's independently . Performs IADL's independently . Calls provider office for new concerns or questions  Initial goal documentation         Follow Up Plan: SW will follow up with patient by phone over the next month.   [name], BSW, CDP Social Worker, Certified Dementia Practitioner [facility] [phone]  Total time spent performing care coordination and/or care management activities with the patient by phone or face to face = 16 minutes.

## 2019-07-20 ENCOUNTER — Ambulatory Visit: Payer: Medicare PPO | Admitting: Internal Medicine

## 2019-07-20 ENCOUNTER — Encounter: Payer: Self-pay | Admitting: Internal Medicine

## 2019-07-20 ENCOUNTER — Other Ambulatory Visit: Payer: Self-pay

## 2019-07-20 VITALS — BP 140/82 | HR 64 | Temp 98.7°F | Ht 62.2 in | Wt 155.0 lb

## 2019-07-20 DIAGNOSIS — E7800 Pure hypercholesterolemia, unspecified: Secondary | ICD-10-CM

## 2019-07-20 DIAGNOSIS — I700 Atherosclerosis of aorta: Secondary | ICD-10-CM | POA: Diagnosis not present

## 2019-07-20 DIAGNOSIS — R0789 Other chest pain: Secondary | ICD-10-CM | POA: Diagnosis not present

## 2019-07-20 MED ORDER — NEXLIZET 180-10 MG PO TABS: ORAL_TABLET | ORAL | 2 refills | Status: DC

## 2019-07-20 NOTE — Progress Notes (Signed)
This visit occurred during the SARS-CoV-2 public health emergency.  Safety protocols were in place, including screening questions prior to the visit, additional usage of staff PPE, and extensive cleaning of exam room while observing appropriate contact time as indicated for disinfecting solutions.  Subjective:     Patient ID: [name] , female    DOB: 1946/01/18 , 74 y.o.   MRN: [id number]   Chief Complaint  Patient presents with  . Hospitalization Follow-up    chest pain    HPI  She presents today for hospital f/u. [facility] was admitted to [facility] on 4/19 for further evaluation of cheat pain.   Hospital Course: 74 years old black female with PMH of HTN, Hypothyroidism, diet controlled type 2 DM, CAD and right leg weakness presents with  left precordial and across the chest area.  Described as sharp pain radiating to left shoulder and arm; also with left breast pain radiating to mid back area x 2 weeks. Her HS-Troponin I levels were normal. She underwent Nuclear stress test which showed no reversible ischemia and EF of 68 %.  She has baseline heart rate in 40's and improves to 60's with normal activity. She denies dizziness. She does recall lifting 40 pound girl on a regular basis. She denied fall/trauma.   She was discharged in stable condition on 4/20. She was reminded to avoid heavy duty pull, push or lift and use muscle rub, hot shower and ibuprofen as needed x 1-2 weeks. She will improve her diet to reduce her LDL cholesterol level.  She has had similar sx since her discharge - no associated palpitations and/or diaphoresis.     Past Medical History:  Diagnosis Date  . Benign hypertensive renal disease   . CKD stage 2 due to type 2 diabetes mellitus (HCC)   . Hypertension   . Hypothyroidism   . Obesity   . OSA on CPAP   . Pure hypercholesterolemia   . PVD (peripheral vascular disease) (HCC)   . Vitamin D deficiency      Family History  Problem Relation Age of Onset  .  Alzheimer's disease Mother   . Aneurysm Father   . Cancer Brother      Current Outpatient Medications:  .  albuterol (PROVENTIL HFA;VENTOLIN HFA) 108 (90 Base) MCG/ACT inhaler, Inhale 2 puffs into the lungs every 6 (six) hours as needed for wheezing or shortness of breath., Disp: 1 Inhaler, Rfl: 2 .  amLODipine (NORVASC) 5 MG tablet, Take 5 mg by mouth at bedtime. , Disp: , Rfl:  .  aspirin EC 81 MG tablet, Take 81 mg by mouth daily., Disp: , Rfl:  .  b complex vitamins tablet, Take 1 tablet by mouth daily with lunch. , Disp: , Rfl:  .  Calcium Carb-Cholecalciferol (CALCIUM + D3 PO), Take 1 tablet by mouth at bedtime., Disp: , Rfl:  .  Cholecalciferol (VITAMIN D3 PO), Take 1 tablet by mouth daily., Disp: , Rfl:  .  cilostazol (PLETAL) 100 MG tablet, Take 100 mg by mouth daily. , Disp: , Rfl: 3 .  furosemide (LASIX) 20 MG tablet, Take 1 tablet (20 mg total) by mouth daily., Disp: 90 tablet, Rfl: 2 .  hydrALAZINE (APRESOLINE) 10 MG tablet, Take 1 tablet (10 mg total) by mouth every 8 (eight) hours. (Patient taking differently: Take 10 mg by mouth 3 (three) times daily with meals. ), Disp: 90 tablet, Rfl: 3 .  Ibuprofen-diphenhydrAMINE Cit (ADVIL PM PO), Take 1 tablet by mouth at bedtime  as needed (sleep)., Disp: , Rfl:  .  isosorbide mononitrate (IMDUR) 60 MG 24 hr tablet, Take 1 tablet (60 mg total) by mouth at bedtime., Disp: , Rfl:  .  levothyroxine (SYNTHROID) 125 MCG tablet, TAKE 1 TABLET DAILY ON MONDAY THRU FRIDAY (Patient taking differently: Take 125 mcg by mouth See admin instructions. Take one tablet (125 mcg) by mouth on Monday thru Friday before breakfast (skip Saturday and Sunday)), Disp: 65 tablet, Rfl: 3 .  Magnesium 250 MG TABS, Take 1 tablet (250 mg total) by mouth at bedtime., Disp: 90 tablet, Rfl: 2 .  Multiple Vitamins-Minerals (MULTIVITAMIN WITH MINERALS) tablet, Take 1 tablet by mouth daily with lunch. , Disp: , Rfl:  .  nitroGLYCERIN (NITROSTAT) 0.4 MG SL tablet, Place 1  tablet (0.4 mg total) under the tongue every 5 (five) minutes x 3 doses as needed for chest pain., Disp: 25 tablet, Rfl: 1 .  olmesartan (BENICAR) 40 MG tablet, Take 1 tablet (40 mg total) by mouth daily., Disp: 90 tablet, Rfl: 2 .  potassium chloride (K-DUR) 10 MEQ tablet, Take 10 mEq by mouth 2 (two) times daily with breakfast and lunch. , Disp: , Rfl: 3 .  Semaglutide (OZEMPIC, 0.25 OR 0.5 MG/DOSE, [place]), Inject 0.25 mg into the skin every Thursday., Disp: , Rfl:    Allergies  Allergen Reactions  . Atorvastatin Swelling and Other (See Comments)    Unknown reaction  . Clopidogrel Other (See Comments)    Caused bruising  . Metoprolol Other (See Comments)    Slows heart rate to low  . Metoprolol Tartrate Swelling  . Pregabalin Swelling  . Rosuvastatin Calcium Other (See Comments)    Unknown reaction     Review of Systems  Constitutional: Negative.   Respiratory: Negative.   Cardiovascular: Negative.   Gastrointestinal: Negative.   Neurological: Negative.   Psychiatric/Behavioral: Negative.      Today's Vitals   07/20/19 1421  BP: 140/82  Pulse: 64  Temp: 98.7 F (37.1 C)  TempSrc: Oral  Weight: 155 lb (70.3 kg)  Height: 5' 2.2" (1.58 m)   Body mass index is 28.17 kg/m.   Objective:  Physical Exam Vitals and nursing note reviewed.  Constitutional:      Appearance: Normal appearance.  HENT:     Head: Normocephalic and atraumatic.  Cardiovascular:     Rate and Rhythm: Normal rate and regular rhythm.     Heart sounds: Murmur present.  Pulmonary:     Effort: Pulmonary effort is normal.     Breath sounds: Normal breath sounds.     Comments: Chest wall tenderness to palpation on left anterior chest Skin:    General: Skin is warm.  Neurological:     General: No focal deficit present.     Mental Status: She is alert.  Psychiatric:        Mood and Affect: Mood normal.        Behavior: Behavior normal.         Assessment And Plan:     1. Chest wall pain  TCM  PERFORMED. A MEMBER OF THE CLINICAL TEAM SPOKE WITH THE PATIENT UPON DISCHARGE. DISCHARGE SUMMARY WAS REVIEWED IN FULL DETAIL DURING THE VISIT. MEDS RECONCILED AND COMPARED TO DISCHARGE MEDS. MEDICATION LIST WAS UPDATED AND REVIEWED WITH THE PATIENT. GREATER THAN 50% FACE TO FACE TIME WAS SPENT IN COUNSELING AND COORDINATION OF CARE. ALL QUESTIONS WERE ANSWERED TO THE SATISFACTION OF THE PATIENT. She was given stretching exercises to perform to stretch pectoralis  muscles. I demonstrated the stretch and then she performed the same stretch. She is encouraged to perform daily. Also advised to get topical pain cream like Aspercream or a muscle rub to apply to affected area twice daily as needed.   2. Pure hypercholesterolemia  Her most recent LDL is 134. Pt was reminded that LDL goal is less than 70 for those with diabetes. She is encouraged to make necessary lifestyle changes to help her reach this goal. I will consider use of Nexlizet in the future since she has not tolerated daily statins in the past.   3. Aortic atherosclerosis (HCC)  Chronic. Importance of achieving optimal lipid goal was discussed with the patient.   [name], MD    THE PATIENT IS ENCOURAGED TO PRACTICE SOCIAL DISTANCING DUE TO THE COVID-19 PANDEMIC.

## 2019-07-20 NOTE — Patient Instructions (Signed)

## 2019-07-21 ENCOUNTER — Telehealth: Payer: Self-pay

## 2019-07-27 ENCOUNTER — Telehealth: Payer: Self-pay

## 2019-07-27 DIAGNOSIS — R072 Precordial pain: Secondary | ICD-10-CM | POA: Diagnosis not present

## 2019-07-27 DIAGNOSIS — M545 Low back pain: Secondary | ICD-10-CM | POA: Diagnosis not present

## 2019-07-27 DIAGNOSIS — M542 Cervicalgia: Secondary | ICD-10-CM | POA: Diagnosis not present

## 2019-07-27 DIAGNOSIS — I2510 Atherosclerotic heart disease of native coronary artery without angina pectoris: Secondary | ICD-10-CM | POA: Diagnosis not present

## 2019-07-29 DIAGNOSIS — G4733 Obstructive sleep apnea (adult) (pediatric): Secondary | ICD-10-CM | POA: Diagnosis not present

## 2019-07-30 DIAGNOSIS — E1165 Type 2 diabetes mellitus with hyperglycemia: Secondary | ICD-10-CM | POA: Diagnosis not present

## 2019-07-30 DIAGNOSIS — E119 Type 2 diabetes mellitus without complications: Secondary | ICD-10-CM | POA: Diagnosis not present

## 2019-07-30 DIAGNOSIS — Z794 Long term (current) use of insulin: Secondary | ICD-10-CM | POA: Diagnosis not present

## 2019-08-04 ENCOUNTER — Ambulatory Visit: Payer: Self-pay

## 2019-08-04 DIAGNOSIS — R0789 Other chest pain: Secondary | ICD-10-CM

## 2019-08-04 DIAGNOSIS — I10 Essential (primary) hypertension: Secondary | ICD-10-CM

## 2019-08-04 DIAGNOSIS — I2510 Atherosclerotic heart disease of native coronary artery without angina pectoris: Secondary | ICD-10-CM

## 2019-08-04 NOTE — Patient Instructions (Signed)
Social Worker Visit Information  Goals we discussed today:  Goals Addressed            This Visit's Progress   . Collaborate with RN Care Manager to perform approrpiate assessments to determine care coordination needs following recent IP admission   On track    CARE PLAN ENTRY (see longitudinal plan of care for additional care plan information)  Current Barriers:  . Recent IP admission related to chest pain . History of CAD, HTN, and DM II  Social Work Clinical Goal(s):  [name] Over the next 30 days the patient will work with care management team and primary care provider to develop a plan to optimize management of chronic health conditions as evidenced by no ED visits over the next 60 days  CCM SW Interventions: Completed 5/112021 with the patient . Inter-disciplinary care team collaboration (see longitudinal plan of care) . Successful outbound call placed to the patient to assist with care coordination needs . Determined the patient is still experiencing some muscle soreness but notices an improvement since last OV . Informed by the patient she experienced chest pain on 08/03/19 which prompted two doses of nitroglycerin o No acute chest pain continues during today's call . Encouraged the patient to notify primary care team and/or cardiologist with ongoing symptoms . Collaboration with RN Care Manager and Dr. Allyne Gee regarding symptom improvement of sore muscle as well as reported chest paint . Scheduled follow up call to the patient over the next 45 days  Patient Self Care Activities:  . Patient verbalizes understanding of plan to contact primary care team for acute needs prior to appointment on 4/26 . Self administers medications as prescribed . Attends all scheduled provider appointments . Performs ADL's independently . Performs IADL's independently . Calls provider office for new concerns or questions  Please see past updates related to this goal by clicking on the "Past  Updates" button in the selected goal          Follow Up Plan: SW will follow up with patient by phone over the next 45 days   [name], BSW, CDP Social Worker, Certified Dementia Practitioner [facility] Care Management [phone]

## 2019-08-04 NOTE — Chronic Care Management (AMB) (Signed)
Chronic Care Management    Social Work Follow Up Note  08/04/2019 Name: [name] MRN: [id number] DOB: 04-Oct-1945  [name] is a 74 y.o. year old female who is a primary care patient of Glendale Chard, MD. The CCM team was consulted for assistance with care coordination.   Review of patient status, including review of consultants reports, other relevant assessments, and collaboration with appropriate care team members and the patient's provider was performed as part of comprehensive patient evaluation and provision of chronic care management services.    SDOH (Social Determinants of Health) assessments performed: No    Outpatient Encounter Medications as of 08/04/2019  Medication Sig Note  . albuterol (PROVENTIL HFA;VENTOLIN HFA) 108 (90 Base) MCG/ACT inhaler Inhale 2 puffs into the lungs every 6 (six) hours as needed for wheezing or shortness of breath.   [name] amLODipine (NORVASC) 5 MG tablet Take 5 mg by mouth at bedtime.    [name] aspirin EC 81 MG tablet Take 81 mg by mouth daily.   [name] b complex vitamins tablet Take 1 tablet by mouth daily with lunch.    . Bempedoic Acid-Ezetimibe (NEXLIZET) 180-10 MG TABS One tab po MWF   . Calcium Carb-Cholecalciferol (CALCIUM + D3 PO) Take 1 tablet by mouth at bedtime.   . Cholecalciferol (VITAMIN D3 PO) Take 1 tablet by mouth daily.   . cilostazol (PLETAL) 100 MG tablet Take 100 mg by mouth daily.    . furosemide (LASIX) 20 MG tablet Take 1 tablet (20 mg total) by mouth daily.   . hydrALAZINE (APRESOLINE) 10 MG tablet Take 1 tablet (10 mg total) by mouth every 8 (eight) hours. (Patient taking differently: Take 10 mg by mouth 3 (three) times daily with meals. )   . Ibuprofen-diphenhydrAMINE Cit (ADVIL PM PO) Take 1 tablet by mouth at bedtime as needed (sleep).   . isosorbide mononitrate (IMDUR) 60 MG 24 hr tablet Take 1 tablet (60 mg total) by mouth at bedtime.   [name] levothyroxine (SYNTHROID) 125 MCG tablet TAKE 1 TABLET DAILY ON MONDAY  THRU FRIDAY (Patient taking differently: Take 125 mcg by mouth See admin instructions. Take one tablet (125 mcg) by mouth on Monday thru Friday before breakfast (skip Saturday and Sunday))   . Magnesium 250 MG TABS Take 1 tablet (250 mg total) by mouth at bedtime.   . Multiple Vitamins-Minerals (MULTIVITAMIN WITH MINERALS) tablet Take 1 tablet by mouth daily with lunch.    . nitroGLYCERIN (NITROSTAT) 0.4 MG SL tablet Place 1 tablet (0.4 mg total) under the tongue every 5 (five) minutes x 3 doses as needed for chest pain. 07/13/2019: 1 tablet today  . olmesartan (BENICAR) 40 MG tablet Take 1 tablet (40 mg total) by mouth daily.   . potassium chloride (K-DUR) 10 MEQ tablet Take 10 mEq by mouth 2 (two) times daily with breakfast and lunch.    . Semaglutide (OZEMPIC, 0.25 OR 0.5 MG/DOSE, [place]) Inject 0.25 mg into the skin every Thursday.    No facility-administered encounter medications on file as of 08/04/2019.     Goals Addressed            This Visit's Progress   . Collaborate with RN Care Manager to perform approrpiate assessments to determine care coordination needs following recent IP admission   On track    [place] (see longitudinal plan of care for additional care plan information)  Current Barriers:  . Recent IP admission related to chest pain . History of CAD, HTN,  and DM II  Social Work Clinical Goal(s):  [name] Over the next 30 days the patient will work with care management team and primary care provider to develop a plan to optimize management of chronic health conditions as evidenced by no ED visits over the next 60 days  CCM SW Interventions: Completed 5/112021 with the patient . Inter-disciplinary care team collaboration (see longitudinal plan of care) . Successful outbound call placed to the patient to assist with care coordination needs . Determined the patient is still experiencing some muscle soreness but notices an improvement since last OV . Informed by the patient  she experienced chest pain on 08/03/19 which prompted two doses of nitroglycerin o No acute chest pain continues during today's call . Encouraged the patient to notify primary care team and/or cardiologist with ongoing symptoms . Collaboration with RN Care Manager and Dr. Allyne Gee regarding symptom improvement of sore muscle as well as reported chest paint . Scheduled follow up call to the patient over the next 45 days  Patient Self Care Activities:  . Patient verbalizes understanding of plan to contact primary care team for acute needs prior to appointment on 4/26 . Self administers medications as prescribed . Attends all scheduled provider appointments . Performs ADL's independently . Performs IADL's independently . Calls provider office for new concerns or questions  Please see past updates related to this goal by clicking on the "Past Updates" button in the selected goal          Follow Up Plan: SW will follow up with patient by phone over the next 45 days.   [name], BSW, CDP Social Worker, Certified Dementia Practitioner [facility] Care Management [phone]  Total time spent performing care coordination and/or care management activities with the patient by phone or face to face = 10 minutes.

## 2019-08-06 ENCOUNTER — Telehealth: Payer: Self-pay | Admitting: Internal Medicine

## 2019-08-06 NOTE — Chronic Care Management (AMB) (Signed)
  Care Management   Note  08/06/2019 Name: [name] MRN: [id number] DOB: 1945/12/10  [name] is a 74 y.o. year old female who is a primary care patient of [name], MD and is actively engaged with the care management team. I reached out to Karle Plumber by phone today to assist with scheduling an initial visit with the Pharmacist  Follow up plan: Telephone appointment with care management team member scheduled for:09/21/2019  [facility] Guide, Embedded Care Coordination [facility]  [place], [place] [address] Direct Dial: [phone] [email or web address]

## 2019-08-11 ENCOUNTER — Other Ambulatory Visit: Payer: Self-pay | Admitting: Pharmacy Technician

## 2019-08-11 NOTE — Patient Outreach (Signed)
[facility])   08/11/2019  [name] 02-Dec-1945 [id number]    Follow up call placed to [employer] regarding patient assistance application(s) for Proventil HFA , [name] confirms patient has not returned attestation form that was mailed out to her.   Follow up:  Will remove myself from care team due to patient not returning required form to company.  [name] [name], CPhT Certified Pharmacy Technician [facility]

## 2019-08-19 ENCOUNTER — Telehealth: Payer: Self-pay

## 2019-08-19 NOTE — Telephone Encounter (Signed)
-----   Message from [name], MD sent at 08/18/2019 11:29 AM EDT ----- You can advise her to stop the medication.  ----- Message ----- From: Mariam Dollar, CMA Sent: 08/18/2019  11:00 AM EDT To: [name], MD  The pt said that she has started having stomach pains and feeling weak yesterday and that she looked at the side effects of the [facility], she said it's the same way  she was feeling when she was on a statin.  The pt said she has 5 pills left and wants to know if Dr. Allyne Gee wants her to continue with the med or prescribe her something else.

## 2019-08-19 NOTE — Telephone Encounter (Signed)
The patient was notified to stop the Nexlatol.

## 2019-08-31 ENCOUNTER — Ambulatory Visit: Payer: Medicare PPO | Admitting: Internal Medicine

## 2019-08-31 ENCOUNTER — Encounter: Payer: Self-pay | Admitting: Internal Medicine

## 2019-08-31 ENCOUNTER — Other Ambulatory Visit: Payer: Self-pay

## 2019-08-31 VITALS — BP 120/60 | HR 66 | Temp 98.6°F | Ht 62.2 in | Wt 147.0 lb

## 2019-08-31 DIAGNOSIS — M791 Myalgia, unspecified site: Secondary | ICD-10-CM

## 2019-08-31 DIAGNOSIS — R310 Gross hematuria: Secondary | ICD-10-CM | POA: Diagnosis not present

## 2019-08-31 DIAGNOSIS — E7800 Pure hypercholesterolemia, unspecified: Secondary | ICD-10-CM | POA: Diagnosis not present

## 2019-08-31 LAB — POCT URINALYSIS DIPSTICK
Bilirubin, UA: NEGATIVE
Blood, UA: NEGATIVE
Glucose, UA: NEGATIVE
Ketones, UA: NEGATIVE
Nitrite, UA: NEGATIVE
Protein, UA: POSITIVE — AB
Spec Grav, UA: 1.02 (ref 1.010–1.025)
Urobilinogen, UA: 0.2 E.U./dL
pH, UA: 6 (ref 5.0–8.0)

## 2019-08-31 MED ORDER — NITROFURANTOIN MONOHYD MACRO 100 MG PO CAPS: 100.0000 mg | ORAL_CAPSULE | Freq: Two times a day (BID) | ORAL | 0 refills | Status: AC

## 2019-09-04 ENCOUNTER — Telehealth: Payer: Self-pay

## 2019-09-13 NOTE — Progress Notes (Signed)
This visit occurred during the SARS-CoV-2 public health emergency.  Safety protocols were in place, including screening questions prior to the visit, additional usage of staff PPE, and extensive cleaning of exam room while observing appropriate contact time as indicated for disinfecting solutions.  Subjective:     Patient ID: [name] , female    DOB: 15-Apr-1945 , 74 y.o.   MRN: [id number]   Chief Complaint  Patient presents with  . Hyperlipidemia    stopped taking due to weakness   . Urinary Tract Infection    HPI  She presents today for cholesterol check. She was taking Nexlizet. She stopped the medication due to muscle weakness and cramps. She is no longer taking the medication.   Urinary Tract Infection  This is a new problem. The current episode started yesterday. The problem occurs every urination. The pain is at a severity of 0/10. There has been no fever. Associated symptoms include hematuria. Pertinent negatives include no hesitancy, sweats or urgency.     Past Medical History:  Diagnosis Date  . Benign hypertensive renal disease   . CKD stage 2 due to type 2 diabetes mellitus ([place])   . Hypertension   . Hypothyroidism   . Obesity   . OSA on CPAP   . Pure hypercholesterolemia   . PVD (peripheral vascular disease) ([place])   . Vitamin D deficiency      Family History  Problem Relation Age of Onset  . Alzheimer's disease Mother   . Aneurysm Father   . Cancer Brother      Current Outpatient Medications:  .  albuterol (PROVENTIL HFA;VENTOLIN HFA) 108 (90 Base) MCG/ACT inhaler, Inhale 2 puffs into the lungs every 6 (six) hours as needed for wheezing or shortness of breath., Disp: 1 Inhaler, Rfl: 2 .  amLODipine (NORVASC) 5 MG tablet, Take 5 mg by mouth at bedtime. , Disp: , Rfl:  .  aspirin EC 81 MG tablet, Take 81 mg by mouth daily., Disp: , Rfl:  .  b complex vitamins tablet, Take 1 tablet by mouth daily with lunch. , Disp: , Rfl:  .  Calcium  Carb-Cholecalciferol (CALCIUM + D3 PO), Take 1 tablet by mouth at bedtime., Disp: , Rfl:  .  Cholecalciferol (VITAMIN D3 PO), Take 1 tablet by mouth daily., Disp: , Rfl:  .  cilostazol (PLETAL) 100 MG tablet, Take 100 mg by mouth daily. , Disp: , Rfl: 3 .  furosemide (LASIX) 20 MG tablet, Take 1 tablet (20 mg total) by mouth daily., Disp: 90 tablet, Rfl: 2 .  hydrALAZINE (APRESOLINE) 10 MG tablet, Take 1 tablet (10 mg total) by mouth every 8 (eight) hours. (Patient taking differently: Take 10 mg by mouth 3 (three) times daily with meals. ), Disp: 90 tablet, Rfl: 3 .  Ibuprofen-diphenhydrAMINE Cit (ADVIL PM PO), Take 1 tablet by mouth at bedtime as needed (sleep)., Disp: , Rfl:  .  isosorbide mononitrate (IMDUR) 60 MG 24 hr tablet, Take 1 tablet (60 mg total) by mouth at bedtime., Disp: , Rfl:  .  levothyroxine (SYNTHROID) 125 MCG tablet, TAKE 1 TABLET DAILY ON MONDAY THRU FRIDAY (Patient taking differently: Take 125 mcg by mouth See admin instructions. Take one tablet (125 mcg) by mouth on Monday thru Friday before breakfast (skip Saturday and Sunday)), Disp: 65 tablet, Rfl: 3 .  Magnesium 250 MG TABS, Take 1 tablet (250 mg total) by mouth at bedtime., Disp: 90 tablet, Rfl: 2 .  Multiple Vitamins-Minerals (MULTIVITAMIN WITH MINERALS) tablet, Take  1 tablet by mouth daily with lunch. , Disp: , Rfl:  .  nitroGLYCERIN (NITROSTAT) 0.4 MG SL tablet, Place 1 tablet (0.4 mg total) under the tongue every 5 (five) minutes x 3 doses as needed for chest pain., Disp: 25 tablet, Rfl: 1 .  olmesartan (BENICAR) 40 MG tablet, Take 1 tablet (40 mg total) by mouth daily., Disp: 90 tablet, Rfl: 2 .  potassium chloride (K-DUR) 10 MEQ tablet, Take 10 mEq by mouth 2 (two) times daily with breakfast and lunch. , Disp: , Rfl: 3 .  Semaglutide (OZEMPIC, 0.25 OR 0.5 MG/DOSE, [place]), Inject 0.25 mg into the skin every Thursday., Disp: , Rfl:  .  Bempedoic Acid-Ezetimibe (NEXLIZET) 180-10 MG TABS, One tab po MWF (Patient not taking:  Reported on 08/31/2019), Disp: 36 tablet, Rfl: 2   Allergies  Allergen Reactions  . Atorvastatin Swelling and Other (See Comments)    Unknown reaction  . Clopidogrel Other (See Comments)    Caused bruising  . Metoprolol Other (See Comments)    Slows heart rate to low  . Metoprolol Tartrate Swelling  . Pregabalin Swelling  . Rosuvastatin Calcium Other (See Comments)    Unknown reaction     Review of Systems  Constitutional: Negative.   Respiratory: Negative.   Cardiovascular: Negative.   Gastrointestinal: Negative.   Genitourinary: Positive for hematuria. Negative for hesitancy and urgency.       She c/o episode of gross hematuria yesterday. She is sure that this is not due to vaginal bleeding. Denies dysuria.   Neurological: Negative.   Psychiatric/Behavioral: Negative.      Today's Vitals   08/31/19 1517  BP: 120/60  Pulse: 66  Temp: 98.6 F (37 C)  TempSrc: Oral  Weight: 147 lb (66.7 kg)  Height: 5' 2.2" (1.58 m)   Body mass index is 26.71 kg/m.   Objective:  Physical Exam Vitals and nursing note reviewed.  Constitutional:      Appearance: Normal appearance.  HENT:     Head: Normocephalic and atraumatic.  Cardiovascular:     Rate and Rhythm: Normal rate and regular rhythm.     Heart sounds: Normal heart sounds.  Pulmonary:     Effort: Pulmonary effort is normal.     Breath sounds: Normal breath sounds.  Skin:    General: Skin is warm.  Neurological:     General: No focal deficit present.     Mental Status: She is alert.  Psychiatric:        Mood and Affect: Mood normal.        Behavior: Behavior normal.         Assessment And Plan:     1. Pure hypercholesterolemia  She has been statin intolerant. At this point, she may be a candidate for Repatha. She agrees to referral to [facility].   - Ambulatory referral to Cardiology  2. Myalgia  She is encouraged to stay well hydrated.  She may also+ benefit from CoQ10.  3. Gross  hematuria  She was given rx nitrofurantoin 100mg  twice daily. She is encouraged to take full abx course. She is encouraged to let me know if her sx recur.   - POCT Urinalysis Dipstick (81002)   , MD    THE PATIENT IS ENCOURAGED TO PRACTICE SOCIAL DISTANCING DUE TO THE COVID-19 PANDEMIC.

## 2019-09-14 ENCOUNTER — Ambulatory Visit: Payer: Self-pay

## 2019-09-14 ENCOUNTER — Telehealth: Payer: Self-pay

## 2019-09-14 DIAGNOSIS — R0789 Other chest pain: Secondary | ICD-10-CM

## 2019-09-14 DIAGNOSIS — I10 Essential (primary) hypertension: Secondary | ICD-10-CM

## 2019-09-14 DIAGNOSIS — E1122 Type 2 diabetes mellitus with diabetic chronic kidney disease: Secondary | ICD-10-CM

## 2019-09-14 NOTE — Chronic Care Management (AMB) (Signed)
  Chronic Care Management   Outreach Note  09/14/2019 Name: [name] MRN: [id number] DOB: December 26, 1945  Referred by: [name], MD Reason for referral : Care Coordination   SW placed an unsuccessful outbound call to the patient to follow up on goal progression and assist with care coordination needs. SW left a HIPAA compliant voice message requesting a return call.  Follow Up Plan: The care management team will reach out to the patient again over the next 14 days.   [name], BSW, CDP Social Worker, Certified Dementia Practitioner [facility] Care Management [phone]

## 2019-09-16 NOTE — Chronic Care Management (AMB) (Deleted)
Chronic Care Management Pharmacy  Name: [name]  MRN: [id number] DOB: [birth year]  Chief Complaint/ HPI  [name],  74 y.o. , female presents for their Initial CCM visit with the clinical pharmacist {[name]:[phone]}.  PCP : Catherine Chard, MD  Their chronic conditions include: HTN, CAD, Type 2 DM, Hypothyroidism, Insomnia, Osteoarthritis of right knee.   Office Visits:*** 08/31/2019 - Patient in for a cholesterol check. Patient stopped taking Nexlizet due to muscle weakness and cramps. Dr. Baird [name] referring to advanced lipid clinic for potential candidate for [place]. Nitrofurantoin 100 mg bid for UTI.  07/20/2019 - Patient f/u after hospital for chest pain. Dr. Baird [name] provided patient with chest stretching exercise and recommended aspercream. Cholesterol elevated above goal - consider use of Nexlizet due to hx of statin intolerance.  05/27/2019 - Physical Exam with AWV - no medication changes.  Consult Visit:*** 07/13/2019-07/14/2019 [facility] visit for ACS. Troponin 1 levels were normal. Nuclear stress test no reversible ischemia and EF 68%.   CCM Visit*** 09/14/2019 - SW unsuccessful attempt 08/04/2019 - SW f/u on improved muscle soreness. Patient had chest pain 08/03/2019 and took NTG x2 doses.  07/15/2019 - TOC phone call. No med changes. F/U with Dr. Baird [name] 04/26.  07/15/2019 -  SW discussed recent hospitalization with patient. Patient reports cardiologist had recommended pacemaker due to pain. Negative cardiac workup. Patient to follow-up with cardiologist and PCP.  06/25/2019 - RN CCM call. Discussed medications and lifestyle modifications.  05/04/2019 - Pharmacist -  Ozempic PAP submitted and samples provided. Encouraged patient to call in refills of hypertensive medications.    Medications: Outpatient Encounter Medications as of 09/21/2019  Medication Sig Note  . albuterol (PROVENTIL HFA;VENTOLIN HFA) 108 (90 Base)  MCG/ACT inhaler Inhale 2 puffs into the lungs every 6 (six) hours as needed for wheezing or shortness of breath.   [name] amLODipine (NORVASC) 5 MG tablet Take 5 mg by mouth at bedtime.    [name] aspirin EC 81 MG tablet Take 81 mg by mouth daily.   [name] b complex vitamins tablet Take 1 tablet by mouth daily with lunch.    . Bempedoic Acid-Ezetimibe (NEXLIZET) 180-10 MG TABS One tab po MWF (Patient not taking: Reported on 08/31/2019)   . Calcium Carb-Cholecalciferol (CALCIUM + D3 PO) Take 1 tablet by mouth at bedtime.   . Cholecalciferol (VITAMIN D3 PO) Take 1 tablet by mouth daily.   . cilostazol (PLETAL) 100 MG tablet Take 100 mg by mouth daily.    . furosemide (LASIX) 20 MG tablet Take 1 tablet (20 mg total) by mouth daily.   . hydrALAZINE (APRESOLINE) 10 MG tablet Take 1 tablet (10 mg total) by mouth every 8 (eight) hours. (Patient taking differently: Take 10 mg by mouth 3 (three) times daily with meals. )   . Ibuprofen-diphenhydrAMINE Cit (ADVIL PM PO) Take 1 tablet by mouth at bedtime as needed (sleep).   . isosorbide mononitrate (IMDUR) 60 MG 24 hr tablet Take 1 tablet (60 mg total) by mouth at bedtime.   [name] levothyroxine (SYNTHROID) 125 MCG tablet TAKE 1 TABLET DAILY ON MONDAY THRU FRIDAY (Patient taking differently: Take 125 mcg by mouth See admin instructions. Take one tablet (125 mcg) by mouth on Monday thru Friday before breakfast (skip Saturday and Sunday))   . Magnesium 250 MG TABS Take 1 tablet (250 mg total) by mouth at bedtime.   . Multiple Vitamins-Minerals (MULTIVITAMIN WITH MINERALS) tablet Take 1 tablet by mouth daily with lunch.    [name]  nitroGLYCERIN (NITROSTAT) 0.4 MG SL tablet Place 1 tablet (0.4 mg total) under the tongue every 5 (five) minutes x 3 doses as needed for chest pain. 07/13/2019: 1 tablet today  . olmesartan (BENICAR) 40 MG tablet Take 1 tablet (40 mg total) by mouth daily.   . potassium chloride (K-DUR) 10 MEQ tablet Take 10 mEq by mouth 2 (two) times daily with breakfast and lunch.     . Semaglutide (OZEMPIC, 0.25 OR 0.5 MG/DOSE, [place]) Inject 0.25 mg into the skin every Thursday.    No facility-administered encounter medications on file as of 09/21/2019.   Allergies  Allergen Reactions  . Atorvastatin Swelling and Other (See Comments)    Unknown reaction  . Clopidogrel Other (See Comments)    Caused bruising  . Metoprolol Other (See Comments)    Slows heart rate to low  . Metoprolol Tartrate Swelling  . Pregabalin Swelling  . Rosuvastatin Calcium Other (See Comments)    Unknown reaction   SDOH Screenings   Alcohol Screen:   . Last Alcohol Screening Score (AUDIT):   Depression (PHQ2-9): Low Risk   . PHQ-2 Score: 1  Financial Resource Strain: Low Risk   . Difficulty of Paying Living Expenses: Not hard at all  Food Insecurity: No Food Insecurity  . Worried About Charity fundraiser in the Last Year: Never true  . Ran Out of Food in the Last Year: Never true  Housing: Low Risk   . Last Housing Risk Score: 0  Physical Activity: Inactive  . Days of Exercise per Week: 0 days  . Minutes of Exercise per Session: 0 min  Social Connections:   . Frequency of Communication with Friends and Family:   . Frequency of Social Gatherings with Friends and Family:   . Attends Religious Services:   . Active Member of Clubs or Organizations:   . Attends Archivist Meetings:   [name] Marital Status:   Stress: No Stress Concern Present  . Feeling of Stress : Not at all  Tobacco Use: Low Risk   . Smoking Tobacco Use: Never Smoker  . Smokeless Tobacco Use: Never Used  Transportation Needs: No Transportation Needs  . Lack of Transportation (Medical): No  . Lack of Transportation (Non-Medical): No     Current Diagnosis/Assessment:  Goals Addressed   None     Hyperlipidemia   LDL goal < ***  Lipid Panel     Component Value Date/Time   CHOL 199 07/14/2019 0433   CHOL 199 06/02/2019 0918   TRIG 33 07/14/2019 0433   HDL 58 07/14/2019 0433   HDL 68  06/02/2019 0918   LDLCALC 134 (H) 07/14/2019 0433   LDLCALC 116 (H) 06/02/2019 0918    Hepatic Function Latest Ref Rng & Units 07/13/2019 06/02/2019 02/12/2019  Total Protein 6.5 - 8.1 g/dL 6.3(L) 6.6 6.2  Albumin 3.5 - 5.0 g/dL 4.1 4.5 4.4  AST 15 - 41 U/L 43(H) 38 43(H)  ALT 0 - 44 U/L 22 17 27   Alk Phosphatase 38 - 126 U/L 45 58 60  Total Bilirubin 0.3 - 1.2 mg/dL 0.5 0.3 0.3     The 10-year ASCVD risk score Mikey Bussing DC Jr., et al., 2013) is: 29.2%   Values used to calculate the score:     Age: 84 years     Sex: Female     Is Non-Hispanic African American: Yes     Diabetic: Yes     Tobacco smoker: No     Systolic Blood  Pressure: 120 mmHg     Is BP treated: Yes     HDL Cholesterol: 58 mg/dL     Total Cholesterol: 199 mg/dL   Patient has failed these meds in past: niacin, omega 3, rosuvastatin, simvastatin, coq10  Patient is currently {Catherine Controlled/Uncontrolled:[phone]} on the following medications:  . Aspirin ec 81 mg daily . Nexlizet 180-10 mg 1 tablet M/W/F   We discussed:  {Catherine HP Upstream Pharmacy [phone]}  Plan  Continue {Catherine HP Upstream Pharmacy Plans:[phone]}   Diabetes   Recent Relevant Labs: Lab Results  Component Value Date/Time   HGBA1C 5.6 06/02/2019 09:18 AM   HGBA1C 6.3 (H) 02/12/2019 11:35 AM   MICROALBUR 10 05/27/2019 11:30 AM   MICROALBUR 30 05/21/2018 10:56 AM    Kidney Function Lab Results  Component Value Date/Time   CREATININE 1.03 (H) 07/14/2019 04:33 AM   CREATININE 1.20 (H) 07/13/2019 08:36 PM   GFRNONAA 54 (L) 07/14/2019 04:33 AM   GFRAA >60 07/14/2019 04:33 AM   K 4.0 07/14/2019 04:33 AM   K 4.8 07/13/2019 08:36 PM     Checking BG: {Catherine HP Blood Glucose Monitoring Frequency:[phone]}  Recent FBG Readings: Recent pre-meal BG readings: *** Recent 2hr PP BG readings:  *** Recent HS BG readings: *** Patient has failed these meds in past: lantus, cinnamon Patient is currently {Catherine  Controlled/Uncontrolled:[phone]} on the following medications: Ozempic 0.25 mg sq Thursdays  Last diabetic Foot exam:   No results found for: HMDIABFOOTEX *** Last diabetic Eye exam: Lab Results  Component Value Date/Time   HMDIABEYEEXA No Retinopathy 02/27/2019 12:00 AM      We discussed: {Catherine HP Upstream Pharmacy [phone]}  Plan  Continue {Catherine HP Upstream Pharmacy Plans:[phone]},  ***Unstable Angina ***listed in cardio note 03/19 - not in hx***   Patient has failed these meds in past: *** Patient is currently {Catherine Controlled/Uncontrolled:[phone]} on the following medications:  isosorbide mn 60 mg qhs Nitroglycerin 0.4 mg prn  We discussed:  ***  Plan  Continue {Catherine HP Upstream Pharmacy Plans:[phone]}   Hypertension   BP today is:  {Catherine HP UPSTREAM Pharmacist BP ranges:[phone]}  Office blood pressures are  BP Readings from Last 3 Encounters:  08/31/19 120/60  07/20/19 140/82  07/14/19 (!) 146/88    Patient has failed these meds in the past: *** Patient is currently controlled/uncontrolled*** on the following medications: Amlodipine 5 mg qhs, Furosemide 20 mg daily, hydralazine 10 q8h, olmesartan 40 mg daily,   Patient checks BP at home {Catherine HP BP Monitoring [phone]}  Patient home BP readings are ranging: ***  We discussed {Catherine HP Upstream Pharmacy [phone]}  Plan  Continue {Catherine HP [facility]stream Pharmacy NATFT:[phone]}   ***Peripheral Vascular Disease   Patient has failed these meds in past: *** Patient is currently {Catherine Controlled/Uncontrolled:[phone]} on the following medications:  . cilostazol 100 mg daily  We discussed:  ***  Plan  Continue {Catherine HP Upstream Pharmacy Plans:[phone]}   Osteopenia / Osteoporosis - ***No dx in chart***   Last DEXA Scan: *** Last ordered in chart was 2018 - did not see results***  T-Score femoral neck: ***  T-Score total hip: ***  T-Score lumbar spine:  ***  T-Score forearm radius: ***  10-year probability of major osteoporotic fracture: ***  10-year probability of hip fracture: ***  Vit D, 25-Hydroxy  Date Value Ref Range Status  06/02/2019 44.9 30.0 - 100.0 ng/mL Final    Comment:    Vitamin D deficiency has been defined by the Institute of Medicine  and an Endocrine Society practice guideline as a level of serum 25-OH vitamin D less than 20 ng/mL (1,2). The Endocrine Society went on to further define vitamin D insufficiency as a level between 21 and 29 ng/mL (2). 1. IOM (Institute of Medicine). 2010. Dietary reference    intakes for calcium and D. [place]: The    [employer]. 2. Holick MF, Binkley [place], Bischoff-Ferrari HA, et al.    Evaluation, treatment, and prevention of vitamin D    deficiency: an Endocrine Society clinical practice    guideline. JCEM. 2011 Jul; 96(7):1911-30.      Patient {is;is not an osteoporosis candidate:23886}  Patient has failed these meds in past: *** Patient is currently {Catherine Controlled/Uncontrolled:[id number]} on the following medications: calcium carbonate with d daily qhs, Vitamin d3 daily, Magnesium 250 mg daily qhs  We discussed:  {Osteoporosis Counseling:23892}  Plan  Continue {Catherine HP Upstream Pharmacy [phone]}   Hypothyroidism   Lab Results  Component Value Date/Time   TSH 0.602 06/02/2019 09:18 AM   TSH 0.616 11/13/2018 10:43 AM   FREET4 1.75 06/02/2019 09:18 AM   FREET4 1.10 06/23/2018 09:44 AM    Patient has failed these meds in past: n/a Patient is currently {Catherine Controlled/Uncontrolled:[id number]} on the following medications:  . Levothyroxine 125 mcg  We discussed:  {Catherine HP Upstream Pharmacy [phone]}  Plan  Continue {Catherine HP Upstream Pharmacy [phone]}       and  Other Diagnosis:Insomnia    Patient has failed these meds in past: doxepin, temazepam Patient is currently {Catherine Controlled/Uncontrolled:[id number]} on  the following medications: advil pm qhs prn sleep  We discussed:  {Catherine HP Upstream Pharmacy [phone]}  Plan  Continue {Catherine HP Upstream Pharmacy [phone]}   Health Maintenance   Patient is currently {Catherine Controlled/Uncontrolled:[id number]} on the following medications:  [name] Multivitamin daily with lunch - *** . Vitamin B complex daily with lunch - *** . Potassium Chloride 10 meq bid with breakfast and lunch- *** . Albuterol prn - shortness of breath ***  We discussed:  ***  Plan  Continue {Catherine HP Upstream Pharmacy RFFMB:[id number]}  Vaccines   Reviewed and discussed patient's vaccination history.  Prevnar 13?***  Immunization History  Administered Date(s) Administered  . Influenza, High Dose Seasonal PF 03/30/2014, 01/31/2015, 04/04/2016, 12/18/2016, 11/13/2018  . Influenza-Unspecified 01/24/2013, 12/21/2017  . Moderna SARS-COVID-2 Vaccination 05/13/2019, 06/10/2019  . Pneumococcal Polysaccharide-23 12/27/2016  . Zoster 02/03/2013    Plan  Recommended patient receive *** vaccine in *** office/pharmacy.   Medication Management   Pt uses *** pharmacy for all medications Uses pill box? {Yes or If no, why not?:20788} Pt endorses ***% compliance  We discussed: ***  Plan  {[facility] SVXB:93903}    Follow up: *** month phone visit  ***

## 2019-09-18 ENCOUNTER — Other Ambulatory Visit: Payer: Self-pay | Admitting: Internal Medicine

## 2019-09-21 ENCOUNTER — Other Ambulatory Visit: Payer: Self-pay

## 2019-09-21 ENCOUNTER — Ambulatory Visit: Payer: Medicare PPO

## 2019-09-21 DIAGNOSIS — N182 Chronic kidney disease, stage 2 (mild): Secondary | ICD-10-CM

## 2019-09-21 DIAGNOSIS — E1122 Type 2 diabetes mellitus with diabetic chronic kidney disease: Secondary | ICD-10-CM

## 2019-09-21 DIAGNOSIS — I10 Essential (primary) hypertension: Secondary | ICD-10-CM

## 2019-09-21 DIAGNOSIS — E7800 Pure hypercholesterolemia, unspecified: Secondary | ICD-10-CM

## 2019-09-21 NOTE — Progress Notes (Signed)
Cardiology Office Note:   Date:  09/23/2019  NAME:  [name]    MRN: [id number] DOB:  [birth year]   PCP:  [name], MD  Cardiologist:  No primary care provider on file.   Referring MD: [name], MD   Chief Complaint  Patient presents with  . Hyperlipidemia   History of Present Illness:   [name] is a 74 y.o. female with a hx of CAD, HTN, HLD, OSA who is being seen today for the evaluation of HLD at the request of [name], MD. Referred due to inability to tolerate statins, zetia, or bempodoic acid.    She reports history of CAD as detailed below.  She apparently has been unable to get her cholesterol under control.  This is resulted from an inability to tolerate Lipitor due to swelling.  She reports cramping with Crestor.  And she also reports weakness and fatigue with bempadoic acid and Zetia.  I did review her recent left heart catheterization from 2018 which shows diffuse nonobstructive disease.  She also has had a recent cardiac stress test which shows an inferior infarct.  She clearly needs to get her cholesterol lower.  She is interested in PCSK9 inhibitors.  We will place referral to the lipid clinic today.  Regarding her CAD things are stable.  She was admitted in April for chest pain but her stress test was low risk.  She was treated medically.  She reports one episode of chest pain since April that resolved with nitroglycerin immediately.  She is had no further episodes.  She does exercise 45 minutes to 1 hour of walking per day.  This could include walking around the house or using a treadmill.  She denies any chest pain or shortness of breath today.  She does request a refill on her cilostazol.  She is waiting on refills for eyedrops.  She does see Dr. Algie Coffer and will follow with him moving forward.  We will get her over to the lipid clinic.  Problem List 1. CAD -non-obstructive CAD on LHC 2019 -NM stress 06/2019 with apical infarct  -EF  65-70% 2. HLD -T chol 199, HDL 58, LDL 134, TG 33 3. DM -A1c 5.6 4. HTN 5. OSA  Past Medical History: Past Medical History:  Diagnosis Date  . Benign hypertensive renal disease   . CKD stage 2 due to type 2 diabetes mellitus (HCC)   . Hypertension   . Hypothyroidism   . Obesity   . OSA on CPAP   . Pure hypercholesterolemia   . PVD (peripheral vascular disease) (HCC)   . Vitamin D deficiency     Past Surgical History: Past Surgical History:  Procedure Laterality Date  . ABDOMINAL HYSTERECTOMY    . BREAST CYST EXCISION Right over 20 yrs  . broken leg and displaced ankle    . DILATION AND CURETTAGE OF UTERUS    . LEFT HEART CATH AND CORONARY ANGIOGRAPHY N/A 06/13/2017   Procedure: LEFT HEART CATH AND CORONARY ANGIOGRAPHY;  Surgeon: Orpah Cobb, MD;  Location: [facility];  Service: Cardiovascular;  Laterality: N/A;  . right and left rotator cuff    . THYROIDECTOMY      Current Medications: Current Meds  Medication Sig  . albuterol (PROVENTIL HFA;VENTOLIN HFA) 108 (90 Base) MCG/ACT inhaler Inhale 2 puffs into the lungs every 6 (six) hours as needed for wheezing or shortness of breath.  [name] amLODipine (NORVASC) 5 MG tablet Take 5 mg by mouth at  bedtime.   [name]. aspirin EC 81 MG tablet Take 81 mg by mouth daily.  [name]. b complex vitamins tablet Take 1 tablet by mouth daily with lunch.   . Calcium Carb-Cholecalciferol (CALCIUM + D3 PO) Take 1 tablet by mouth at bedtime.  . Cholecalciferol (VITAMIN D3 PO) Take 1 tablet by mouth daily.  . cilostazol (PLETAL) 100 MG tablet Take 1 tablet (100 mg total) by mouth daily.  . furosemide (LASIX) 20 MG tablet Take 1 tablet (20 mg total) by mouth daily.  . hydrALAZINE (APRESOLINE) 10 MG tablet Take 1 tablet (10 mg total) by mouth every 8 (eight) hours. (Patient taking differently: Take 10 mg by mouth 3 (three) times daily with meals. )  . Ibuprofen-diphenhydrAMINE Cit (ADVIL PM PO) Take 1 tablet by mouth at bedtime as needed (sleep).  .  isosorbide mononitrate (IMDUR) 60 MG 24 hr tablet Take 1 tablet (60 mg total) by mouth at bedtime.  [name]. levothyroxine (SYNTHROID) 125 MCG tablet TAKE 1 TABLET DAILY ON MONDAY THRU FRIDAY  . Magnesium 250 MG TABS Take 1 tablet (250 mg total) by mouth at bedtime.  . Multiple Vitamins-Minerals (MULTIVITAMIN WITH MINERALS) tablet Take 1 tablet by mouth daily with lunch.   . nitroGLYCERIN (NITROSTAT) 0.4 MG SL tablet Place 1 tablet (0.4 mg total) under the tongue every 5 (five) minutes x 3 doses as needed for chest pain.  [name]. olmesartan (BENICAR) 40 MG tablet Take 1 tablet (40 mg total) by mouth daily.  . potassium chloride (K-DUR) 10 MEQ tablet Take 10 mEq by mouth 2 (two) times daily with breakfast and lunch.   . Semaglutide (OZEMPIC, 0.25 OR 0.5 MG/DOSE, [place]) Inject 0.25 mg into the skin every Thursday.  . [DISCONTINUED] cilostazol (PLETAL) 100 MG tablet Take 100 mg by mouth daily.      Allergies:    Atorvastatin, Clopidogrel, Metoprolol, Metoprolol tartrate, Pregabalin, and Rosuvastatin calcium   Social History: Social History   Socioeconomic History  . Marital status: Married    Spouse name: Not on file  . Number of children: 3  . Years of education: Not on file  . Highest education level: Not on file  Occupational History  . Occupation: retired  Tobacco Use  . Smoking status: Never Smoker  . Smokeless tobacco: Never Used  Vaping Use  . Vaping Use: Never used  Substance and Sexual Activity  . Alcohol use: No    Alcohol/week: 0.0 standard drinks    Comment: occasional wine  . Drug use: No  . Sexual activity: Yes  Other Topics Concern  . Not on file  Social History Narrative   Drinks 1-2 cups daily of coffee daily.   Social Determinants of Health   Financial Resource Strain: Low Risk   . Difficulty of Paying Living Expenses: Not hard at all  Food Insecurity: No Food Insecurity  . Worried About Programme researcher, broadcasting/film/videounning Out of Food in the Last Year: Never true  . Ran Out of Food in the Last  Year: Never true  Transportation Needs: No Transportation Needs  . Lack of Transportation (Medical): No  . Lack of Transportation (Non-Medical): No  Physical Activity: Inactive  . Days of Exercise per Week: 0 days  . Minutes of Exercise per Session: 0 min  Stress: No Stress Concern Present  . Feeling of Stress : Not at all  Social Connections:   . Frequency of Communication with Friends and Family:   . Frequency of Social Gatherings with Friends and Family:   . Attends Religious  Services:   . Active Member of Clubs or Organizations:   . Attends Banker Meetings:   [name] Marital Status:      Family History: The patient's family history includes Alzheimer's disease in her mother; Aneurysm in her father; Cancer in her brother.  ROS:   All other ROS reviewed and negative. Pertinent positives noted in the HPI.     EKGs/Labs/Other Studies Reviewed:   The following studies were personally reviewed by me today:  EKG:  EKG is ordered today.  The ekg ordered today demonstrates sinus bradycardia, heart rate 56, incomplete right bundle branch block, and was personally reviewed by me.   LHC 06/13/2017  Prox RCA lesion is 20% stenosed.  Ost 2nd Mrg to 2nd Mrg lesion is 50% stenosed.  Ost 1st Diag to 1st Diag lesion is 70% stenosed.  Ost Cx to Mid Cx lesion is 40% stenosed.  Ost LAD to Prox LAD lesion is 35% stenosed.   Life style modification. Try hydralazine for sinus bradycardia.  Echo 2017 - Left ventricle: The cavity size was normal. There was mild  concentric hypertrophy. Systolic function was vigorous. The  estimated ejection fraction was in the range of 65% to 70%. Wall  motion was normal; there were no regional wall motion  abnormalities. Doppler parameters are consistent with abnormal  left ventricular relaxation (grade 1 diastolic dysfunction).  - Aortic valve: There was trivial regurgitation.  - Mitral valve: Calcified annulus. There was mild  regurgitation.  - Left atrium: The atrium was mildly dilated.  - Pulmonary arteries: Systolic pressure was mildly increased. PA  peak pressure: 48 mm Hg (S).   NM STRESS 06/2019 IMPRESSION: 1. Negative for ischemia.  Small apical infarct, stable.  2. Normal left ventricular wall motion.  3. Left ventricular ejection fraction 68%  4. Non invasive risk stratification*: Low  Recent Labs: 06/02/2019: TSH 0.602 07/13/2019: ALT 22 07/14/2019: BUN 12; Creatinine, Ser 1.03; Hemoglobin 11.4; Platelets 226; Potassium 4.0; Sodium 141   Recent Lipid Panel    Component Value Date/Time   CHOL 199 07/14/2019 0433   CHOL 199 06/02/2019 0918   TRIG 33 07/14/2019 0433   HDL 58 07/14/2019 0433   HDL 68 06/02/2019 0918   CHOLHDL 3.4 07/14/2019 0433   VLDL 7 07/14/2019 0433   LDLCALC 134 (H) 07/14/2019 0433   LDLCALC 116 (H) 06/02/2019 0918    Physical Exam:   VS:  BP 120/72   Pulse (!) 56   Temp (!) 97.2 F (36.2 C)   Ht 5\' 2"  (1.575 m)   Wt 153 lb 6.4 oz (69.6 kg)   SpO2 96%   BMI 28.06 kg/m    Wt Readings from Last 3 Encounters:  09/23/19 153 lb 6.4 oz (69.6 kg)  08/31/19 147 lb (66.7 kg)  07/20/19 155 lb (70.3 kg)    General: Well nourished, well developed, in no acute distress Heart: Atraumatic, normal size  Eyes: PEERLA, EOMI  Neck: Supple, no JVD Endocrine: No thryomegaly Cardiac: Normal S1, S2; RRR; no murmurs, rubs, or gallops Lungs: Clear to auscultation bilaterally, no wheezing, rhonchi or rales  Abd: Soft, nontender, no hepatomegaly  Ext: No edema, pulses 2+ Musculoskeletal: No deformities, BUE and BLE strength normal and equal Skin: Warm and dry, no rashes   Neuro: Alert and oriented to person, place, time, and situation, CNII-XII grossly intact, no focal deficits  Psych: Normal mood and affect   ASSESSMENT:   [name] is a 74 y.o. female who presents for  the following: 1. Mixed hyperlipidemia   2. Coronary artery disease involving native coronary  artery of native heart without angina pectoris   3. Essential hypertension     PLAN:   1. Mixed hyperlipidemia 2. Coronary artery disease involving native coronary artery of native heart without angina pectoris -Diffuse nonobstructive disease on left heart cath in 2019.  Recent nuclear medicine stress test with small apical infarct.  No further episodes of chest pain.  Appears to be stable.  Remains on aspirin but has been unable to tolerate statin therapy.  She cannot tolerate Lipitor due to swelling and is unable to tolerate Crestor due to muscle cramping.  She also tried Bempodoic acid/zetia but experienced weakness and fatigue with this.  To get her LDL cholesterol less than 70 she will likely need a PCSK9 inhibitor.  We will refer her to a lipid clinic today.  Hopefully we can get her LDL cholesterol at goal.  Her LDL currently is not at goal.  LDL is 134 on recent lab draw.  Her diabetes is well controlled with an A1c of 5.6.  Her thyroid studies are normal with a TSH of 0.60.  I do not see a secondary cause that explains her persistent elevated LDL cholesterol.  She will need PCSK9 inhibitor.  She will follow with her primary cardiologist Dr. Algie Coffer moving forward.  3. Essential hypertension -No change current medications.  Disposition: Return if symptoms worsen or fail to improve.  Medication Adjustments/Labs and Tests Ordered: Current medicines are reviewed at length with the patient today.  Concerns regarding medicines are outlined above.  Orders Placed This Encounter  Procedures  . AMB Referral to [facility]  . EKG 12-Lead   Meds ordered this encounter  Medications  . cilostazol (PLETAL) 100 MG tablet    Sig: Take 1 tablet (100 mg total) by mouth daily.    Dispense:  90 tablet    Refill:  2    Patient Instructions  Medication Instructions:  The current medical regimen is effective;  continue present plan and medications.  *If you need a refill on your  cardiac medications before your next appointment, please call your pharmacy*   Follow-Up: At [facility], you and your health needs are our priority.  As part of our continuing mission to provide you with exceptional heart care, we have created designated Provider Care Teams.  These Care Teams include your primary Cardiologist (physician) and Advanced Practice Providers (APPs -  Physician Assistants and Nurse Practitioners) who all work together to provide you with the care you need, when you need it.  We recommend signing up for the patient portal called "MyChart".  Sign up information is provided on this After Visit Summary.  MyChart is used to connect with patients for Virtual Visits (Telemedicine).  Patients are able to view lab/test results, encounter notes, upcoming appointments, etc.  Non-urgent messages can be sent to your provider as well.   To learn more about what you can do with MyChart, go to [email or web address].    Your next appointment:   As needed  The format for your next appointment:   In Person  Provider:   Lennie Odor, MD   Other Instructions Referral to LIPID CLINIC- they will contact you to set up appointment.     Signed, Lenna Gilford. Flora Lipps, MD [facility]  [address] [place], [place] [address] [phone]  09/23/2019 8:13 AM

## 2019-09-23 ENCOUNTER — Ambulatory Visit: Payer: Medicare PPO | Admitting: Cardiovascular Disease

## 2019-09-23 ENCOUNTER — Encounter: Payer: Self-pay | Admitting: Cardiovascular Disease

## 2019-09-23 ENCOUNTER — Other Ambulatory Visit: Payer: Self-pay

## 2019-09-23 VITALS — BP 120/72 | HR 56 | Temp 97.2°F | Ht 62.0 in | Wt 153.4 lb

## 2019-09-23 DIAGNOSIS — I10 Essential (primary) hypertension: Secondary | ICD-10-CM | POA: Diagnosis not present

## 2019-09-23 DIAGNOSIS — E782 Mixed hyperlipidemia: Secondary | ICD-10-CM | POA: Diagnosis not present

## 2019-09-23 DIAGNOSIS — I2510 Atherosclerotic heart disease of native coronary artery without angina pectoris: Secondary | ICD-10-CM | POA: Diagnosis not present

## 2019-09-23 MED ORDER — CILOSTAZOL 100 MG PO TABS: 100.0000 mg | ORAL_TABLET | Freq: Every day | ORAL | 2 refills | Status: DC

## 2019-09-23 NOTE — Patient Instructions (Signed)
Medication Instructions:  The current medical regimen is effective;  continue present plan and medications.  *If you need a refill on your cardiac medications before your next appointment, please call your pharmacy*   Follow-Up: At [facility], you and your health needs are our priority.  As part of our continuing mission to provide you with exceptional heart care, we have created designated Provider Care Teams.  These Care Teams include your primary Cardiologist (physician) and Advanced Practice Providers (APPs -  Physician Assistants and Nurse Practitioners) who all work together to provide you with the care you need, when you need it.  We recommend signing up for the patient portal called "MyChart".  Sign up information is provided on this After Visit Summary.  MyChart is used to connect with patients for Virtual Visits (Telemedicine).  Patients are able to view lab/test results, encounter notes, upcoming appointments, etc.  Non-urgent messages can be sent to your provider as well.   To learn more about what you can do with MyChart, go to [email or web address].    Your next appointment:   As needed  The format for your next appointment:   In Person  Provider:   Lennie Odor, MD   Other Instructions Referral to LIPID CLINIC- they will contact you to set up appointment.

## 2019-09-25 ENCOUNTER — Ambulatory Visit: Payer: Medicare PPO

## 2019-09-25 DIAGNOSIS — E1122 Type 2 diabetes mellitus with diabetic chronic kidney disease: Secondary | ICD-10-CM

## 2019-09-25 DIAGNOSIS — N182 Chronic kidney disease, stage 2 (mild): Secondary | ICD-10-CM

## 2019-09-25 DIAGNOSIS — E7800 Pure hypercholesterolemia, unspecified: Secondary | ICD-10-CM

## 2019-09-25 NOTE — Chronic Care Management (AMB) (Signed)
Chronic Care Management Pharmacy  Name: [name]  MRN: [id number] DOB: 06/29/1945  Chief Complaint/ HPI  [name],  74 y.o. , female presents for their Initial CCM visit with the clinical pharmacist via telephone due to COVID-19 Pandemic.  PCP : Glendale Chard, MD  Their chronic conditions include: HTN, CAD, Type 2 DM, Hypothyroidism, Insomnia, Osteoarthritis of right knee.   Office Visits: 08/31/2019 OV: Patient in for a cholesterol check. Patient stopped taking Nexlizet due to muscle weakness and cramps. Dr. Baird Cancer referring to advanced lipid clinic for potential candidate for [place]. Nitrofurantoin 100 mg bid for UTI.   07/20/2019 OV: Patient f/u after hospital for chest pain. Dr. Baird Cancer provided patient with chest stretching exercise and recommended aspercream. Cholesterol elevated above goal - consider use of Nexlizet due to hx of statin intolerance.   05/27/2019 AWV and OV: Annual exam and DM/HTN check. Diabetic foot exam performed. No medication changes.  Consult Visits: 07/13/2019-07/14/2019 [facility] visit for ACS. Troponin 1 levels were normal. Nuclear stress test no reversible ischemia and EF 68%.   CCM Visits: 08/04/2019 SW: f/u on improved muscle soreness. Patient had chest pain 08/03/2019 and took NTG x2 doses.   07/15/2019 TOC phone call: No med changes. F/U with Dr. Baird Cancer 04/26.   07/15/2019 SW: discussed recent hospitalization with patient. Patient reports cardiologist had recommended pacemaker due to pain. Negative cardiac workup. Patient to follow-up with cardiologist and PCP.   06/25/2019 RN: Discussed medications and lifestyle modifications.   05/04/2019 PharmD: Ozempic PAP submitted and samples provided. Encouraged patient to call in refills of hypertensive medications.   Medications: Outpatient Encounter Medications as of 09/21/2019  Medication Sig Note  . amLODipine (NORVASC) 5 MG tablet Take 5 mg by mouth at bedtime.    [name]  aspirin EC 81 MG tablet Take 81 mg by mouth daily.   . Calcium Carb-Cholecalciferol (CALCIUM + D3 PO) Take 1 tablet by mouth at bedtime.   . Cholecalciferol (VITAMIN D3 PO) Take 1 tablet by mouth daily.   . furosemide (LASIX) 20 MG tablet Take 1 tablet (20 mg total) by mouth daily.   . hydrALAZINE (APRESOLINE) 10 MG tablet Take 1 tablet (10 mg total) by mouth every 8 (eight) hours. (Patient taking differently: Take 10 mg by mouth 3 (three) times daily with meals. )   . isosorbide mononitrate (IMDUR) 60 MG 24 hr tablet Take 1 tablet (60 mg total) by mouth at bedtime.   [name] levothyroxine (SYNTHROID) 125 MCG tablet TAKE 1 TABLET DAILY ON MONDAY THRU FRIDAY   . Magnesium 250 MG TABS Take 1 tablet (250 mg total) by mouth at bedtime.   . Multiple Vitamins-Minerals (MULTIVITAMIN WITH MINERALS) tablet Take 1 tablet by mouth daily with lunch.    . olmesartan (BENICAR) 40 MG tablet Take 1 tablet (40 mg total) by mouth daily.   . potassium chloride (K-DUR) 10 MEQ tablet Take 10 mEq by mouth 2 (two) times daily with breakfast and lunch.    . Semaglutide (OZEMPIC, 0.25 OR 0.5 MG/DOSE, [place]) Inject 0.25 mg into the skin every Thursday.   . [DISCONTINUED] cilostazol (PLETAL) 100 MG tablet Take 100 mg by mouth daily.    [name] albuterol (PROVENTIL HFA;VENTOLIN HFA) 108 (90 Base) MCG/ACT inhaler Inhale 2 puffs into the lungs every 6 (six) hours as needed for wheezing or shortness of breath.   [name] b complex vitamins tablet Take 1 tablet by mouth daily with lunch.    . nitroGLYCERIN (NITROSTAT) 0.4 MG SL tablet  Place 1 tablet (0.4 mg total) under the tongue every 5 (five) minutes x 3 doses as needed for chest pain. 07/13/2019: 1 tablet today  . [DISCONTINUED] Bempedoic Acid-Ezetimibe (NEXLIZET) 180-10 MG TABS One tab po MWF (Patient not taking: Reported on 09/23/2019)   . [DISCONTINUED] Ibuprofen-diphenhydrAMINE Cit (ADVIL PM PO) Take 1 tablet by mouth at bedtime as needed (sleep).   . [DISCONTINUED] levothyroxine (SYNTHROID) 125  MCG tablet TAKE 1 TABLET DAILY ON MONDAY THRU FRIDAY (Patient taking differently: Take 125 mcg by mouth See admin instructions. Take one tablet (125 mcg) by mouth on Monday thru Friday before breakfast (skip Saturday and Sunday))    No facility-administered encounter medications on file as of 09/21/2019.   Allergies  Allergen Reactions  . Atorvastatin Swelling and Other (See Comments)    Unknown reaction  . Clopidogrel Other (See Comments)    Caused bruising  . Metoprolol Other (See Comments)    Slows heart rate to low  . Metoprolol Tartrate Swelling  . Pregabalin Swelling  . Rosuvastatin Calcium Other (See Comments)    Unknown reaction   SDOH Screenings   Alcohol Screen:   . Last Alcohol Screening Score (AUDIT):   Depression (PHQ2-9): Low Risk   . PHQ-2 Score: 0  Financial Resource Strain: Medium Risk  . Difficulty of Paying Living Expenses: Somewhat hard  Food Insecurity: No Food Insecurity  . Worried About Charity fundraiser in the Last Year: Never true  . Ran Out of Food in the Last Year: Never true  Housing: Low Risk   . Last Housing Risk Score: 0  Physical Activity: Inactive  . Days of Exercise per Week: 0 days  . Minutes of Exercise per Session: 0 min  Social Connections:   . Frequency of Communication with Friends and Family:   . Frequency of Social Gatherings with Friends and Family:   . Attends Religious Services:   . Active Member of Clubs or Organizations:   . Attends Archivist Meetings:   [name] Marital Status:   Stress: No Stress Concern Present  . Feeling of Stress : Not at all  Tobacco Use: Low Risk   . Smoking Tobacco Use: Never Smoker  . Smokeless Tobacco Use: Never Used  Transportation Needs: No Transportation Needs  . Lack of Transportation (Medical): No  . Lack of Transportation (Non-Medical): No     Current Diagnosis/Assessment: SDOH Interventions     Most Recent Value  SDOH Interventions  Financial Strain Interventions Other  (Comment)  [Contacted [employer] regarding Ozempic patient assistance to coordinate next shipment]     Goals Addressed            This Visit's Progress   . Pharmacy Care Plan       .CARE PLAN ENTRY (see longitudinal plan of care for additional care plan information)  Current Barriers:  . Chronic Disease Management support, education, and care coordination needs related to Hypertension, Hyperlipidemia, and Diabetes   Hypertension BP Readings from Last 3 Encounters:  09/30/19 118/74  09/23/19 120/72  08/31/19 120/60   . Pharmacist Clinical Goal(s): o Over the next 180 days, patient will work with PharmD and providers to maintain BP goal <130/80 . Current regimen:   Amlodipine 5 mg at bedtime   Furosemide 20 mg daily  Hydralazine 68m every 8 hours  Olmesartan 40 mg daily . Interventions: o Counseled patient to take hydralazine doses 8 hours apart o Provided dietary and exercise recommendations . Patient self care activities - Over the  next 180 days, patient will: o Check BP periodically and if symptomatic, document, and provide at future appointments o Ensure daily salt intake < 2300 mg/day o Exercise at least 30 minutes daily, 5 days per week  Hyperlipidemia Lab Results  Component Value Date/Time   LDLCALC 134 (H) 07/14/2019 04:33 AM   LDLCALC 116 (H) 06/02/2019 09:18 AM   . Pharmacist Clinical Goal(s): o Over the next 90 days, patient will work with PharmD and providers to achieve LDL goal < 70 . Current regimen:  o N/A . Interventions: o Provided dietary and exercise recommendations . Patient self care activities - Over the next 90 days, patient will: o Exercise at least 30 minutes daily, 5 days per week o Keep appointment with lipid clinic to address LDL and need for medication intervention  Diabetes Lab Results  Component Value Date/Time   HGBA1C 5.7 (H) 09/30/2019 10:48 AM   HGBA1C 5.6 06/02/2019 09:18 AM   . Pharmacist Clinical Goal(s): o Over  the next 180 days, patient will work with PharmD and providers to maintain A1c goal <7% . Current regimen:  o Ozempic 0.25 mg SQ Thursdays . Interventions: o Provided dietary and exercise recommendations o [employer] patient assistance program regarding Ozempic - Next shipment processed 10/12/19. Delivery within 10-14 days (experiencing delays though) . Patient self care activities - Over the next 180 days, patient will: o Check blood sugar once daily, document, and provide at future appointments o Contact provider with any episodes of hypoglycemia o Exercise at least 30 minutes daily, 5 days per week  Medication management . Pharmacist Clinical Goal(s): o Over the next 90 days, patient will work with PharmD and providers to achieve optimal medication adherence . Current pharmacy: [facility] . Interventions o Comprehensive medication review performed. o Continue current medication management strategy o [facility] mail and determined they do not offer pill packaging o Will perform medication cost review to compare [employer] and [facility] pharmacy (who does offer pill packaging) . Patient self care activities - Over the next 90 days, patient will: o Focus on medication adherence by continued use of pill box o Take medications as prescribed o Report any questions or concerns to PharmD and/or provider(s)  Initial goal documentation        Hyperlipidemia   LDL goal < 70  Lipid Panel     Component Value Date/Time   CHOL 199 07/14/2019 0433   CHOL 199 06/02/2019 0918   TRIG 33 07/14/2019 0433   HDL 58 07/14/2019 0433   HDL 68 06/02/2019 0918   LDLCALC 134 (H) 07/14/2019 0433   LDLCALC 116 (H) 06/02/2019 0918    Hepatic Function Latest Ref Rng & Units 09/30/2019 07/13/2019 06/02/2019  Total Protein 6.0 - 8.5 g/dL 6.4 6.3(L) 6.6  Albumin 3.7 - 4.7 g/dL 4.6 4.1 4.5  AST 0 - 40 IU/L 37 43(H) 38  ALT 0 - 32 IU/L 14 22 17   Alk Phosphatase 48 - 121 IU/L 48 45 58  Total  Bilirubin 0.0 - 1.2 mg/dL 0.4 0.5 0.3     The 10-year ASCVD risk score Mikey Bussing DC Jr., et al., 2013) is: 28.6%   Values used to calculate the score:     Age: 62 years     Sex: Female     Is Non-Hispanic African American: Yes     Diabetic: Yes     Tobacco smoker: No     Systolic Blood Pressure: 141 mmHg     Is BP treated: Yes  HDL Cholesterol: 58 mg/dL     Total Cholesterol: 199 mg/dL   Patient has failed these meds in past: niacin, omega 3, rosuvastatin, simvastatin, coq10  Patient is currently uncontrolled on the following medications:  . N/A  We discussed:    Pt reports she took Nexlizet for about 3 weeks  Nexlizet was stopped in May d/t pt complaining of weakness ad stomach pains that she feels are similar to when she was taking a statin  Referred to lipid clinic for potential PCSK9 inhibitor Diet extensively  Eats at least 1 meal a day, not much of an appetite  Drinks at least 64 ounces of water daily, sometimes more  Also drinks coffee  Recommend pt eat small meals during the day (grilled chicken salads, etc) Exercise extensively  Walks in the house, sometimes as much as 10,000 steps a day  Goal is 30 minutes of exercise daily, 5 days per week  Plan Continue control with diet and exercise until lipid clinic evaluation for additional therapy  Diabetes   Recent Relevant Labs: Lab Results  Component Value Date/Time   HGBA1C 5.7 (H) 09/30/2019 10:48 AM   HGBA1C 5.6 06/02/2019 09:18 AM   MICROALBUR 10 05/27/2019 11:30 AM   MICROALBUR 30 05/21/2018 10:56 AM    Kidney Function Lab Results  Component Value Date/Time   CREATININE 1.14 (H) 09/30/2019 10:48 AM   CREATININE 1.03 (H) 07/14/2019 04:33 AM   GFRNONAA 47 (L) 09/30/2019 10:48 AM   GFRAA 55 (L) 09/30/2019 10:48 AM   K 4.4 09/30/2019 10:48 AM   K 4.0 07/14/2019 04:33 AM   Checking BG: Daily  Recent FBG Readings: 90-124 Recent pre-meal BG readings:  Recent 2hr PP BG readings:   Recent HS BG  readings:  Patient has failed these meds in past: Lantus, cinnamon Patient is currently controlled on the following medications:   Ozempic 0.25 mg SQ Thursdays  Aspirin 20m daily  Last diabetic Foot exam: 05/27/19  Last diabetic Eye exam: Lab Results  Component Value Date/Time   HMDIABEYEEXA No Retinopathy 02/27/2019 12:00 AM    We discussed: . Diet and exercise extensively . [employer] to follow up on Ozempic patient assistance shipment o Per [place], shipment processed 10/12/19 . Pt reports she has lost weight on Ozempic  Plan Continue current medications   Angina   Patient has failed these meds in past: N/A Patient is currently controlled on the following medications:   Isosorbide mononitrate 60 mg at bedtime  Nitroglycerin 0.4 mg prn  We discussed:  Pt has chest pain every week on 2 (not consistently), only takes nitro when it gets really bad  Plan Continue current medications  Hypertension   Office blood pressures are  BP Readings from Last 3 Encounters:  09/30/19 118/74  09/23/19 120/72  08/31/19 120/60   Patient has failed these meds in the past: Metoprolol succinate Patient is currently controlled on the following medications:   Amlodipine 5 mg at bedtime   Furosemide 20 mg daily  Hydralazine 165mevery 8 hours  Olmesartan 40 mg daily  Patient checks BP at home infrequently  Patient home BP readings are ranging: None to provide  We discussed: . Diet and exercise extensively . Pt denies headaches and dizziness . Pt states that amlodipine and furosemide look alike and she has trouble telling them apart (they are both very small tablets and the writing on them is small) . BP has improved . Pt reports sometimes taking her afternoon hydralazine doses only 2  hours apart o Counseled patient that hydralazine doses need to be taking 8 hours apart. Consistent dosing is key for keeping BP under control for the entire day  Plan Continue  current medications   Peripheral Vascular Disease   Patient has failed these meds in past: N/A Patient is currently controlled on the following medications:  . Cilostazol 100 mg daily  Plan Continue current medications  Osteopenia / Osteoporosis    Last DEXA Scan: Ordered 04/29/18, expired  Vit D, 25-Hydroxy  Date Value Ref Range Status  06/02/2019 44.9 30.0 - 100.0 ng/mL Final    Comment:    Vitamin D deficiency has been defined by the [place] and an Endocrine Society practice guideline as a level of serum 25-OH vitamin D less than 20 ng/mL (1,2). The Endocrine Society went on to further define vitamin D insufficiency as a level between 21 and 29 ng/mL (2). 1. IOM (Institute of Medicine). 2010. Dietary reference    intakes for calcium and D. [place]: The    [employer]. 2. Holick MF, Binkley Nettie, Bischoff-Ferrari HA, et al.    Evaluation, treatment, and prevention of vitamin D    deficiency: an Endocrine Society clinical practice    guideline. JCEM. 2011 Jul; 96(7):1911-30.     Patient has failed these meds in past: N/A Patient is currently controlled on the following medications:   Calcium carbonate 667m with Vitamin D daily at bedtime  Vitamin D3 2000 units daily  We discussed:  Recommend weight-bearing and muscle strengthening exercises for building and maintaining bone density.  Plan Continue current medications  Recommend DEXA scan   Hypothyroidism  S/P thyroidectomy  Lab Results  Component Value Date/Time   TSH 2.700 09/30/2019 10:48 AM   TSH 0.602 06/02/2019 09:18 AM   FREET4 1.75 06/02/2019 09:18 AM   FREET4 1.10 06/23/2018 09:44 AM   Patient has failed these meds in past: n/a Patient is currently controlled on the following medications:  . Levothyroxine 125 mcg daily Monday through Friday  Plan Continue current medications  Constipation/Irregularity   Patient has failed these meds in past: Dulcolax, enemas,  chewable Dulcolax, Miralax, Metamucil, Citrucel, Prunes, Psyllium fiber Patient is currently controlled on the following medications:  Docusate 1059m4 capsules daily in divided doses (sometimes 5 daily)  We discussed:    Stools are irregular and hard to pass  Sometimes goes a whole week without passing stool  Pt may not be getting enough fiber in her diet  Recommend increase in fiber intake  Increase water intake   Plan Continue current medications  Review additional options for constipation   Health Maintenance   Patient is currently on the following medications:  . Marland Kitchenultivitamin daily with lunch   Magnesium 250 mg daily at bedtime . Vitamin B complex daily with lunch  . Potassium Chloride 10 meq bid with breakfast and lunch . Albuterol 9063mact 2 puffs every 6 hours as needed for shortness of breath   Plan Continue current medications  Discuss albuterol use at follow up  Vaccines   Reviewed and discussed patient's vaccination history.  No record on [facility]  Immunization History  Administered Date(s) Administered  . Influenza, High Dose Seasonal PF 03/30/2014, 01/31/2015, 04/04/2016, 12/18/2016, 11/13/2018  . Influenza-Unspecified 01/24/2013, 12/21/2017  . Moderna SARS-COVID-2 Vaccination 05/13/2019, 06/10/2019  . Pneumococcal Polysaccharide-23 12/27/2016  . Zoster 02/03/2013   We discussed:  Pt states she has had Prevnar13 and Shingrix vaccines  Plan Discuss Tdap at follow up  Medication Management  Pt uses [employer] for all medications Uses pill box? Yes Pt endorses 93% compliance  We discussed:   Pt states that she misses a dose of medications every 2-3 weeks (maybe)  Pt is having trouble getting medications from mail order  She runs out often; says they send medications she does not want and don't send the medications she orders on time  Pt mentioned getting a phone call from someone regarding [employer] pill packaging, but when she calls  back there is no record of who called her  Pt is interested in getting pill packaging  [place] and determined that they do not offer pill packaging. Notified patient  Plan Continue current medication management strategy  Will discuss [facility] pharmacy (adherence packaging and medication synchronization) at follow up Perform Cost review prior to follow up  Follow up: 2 month phone visit  Jannette Fogo, PharmD Clinical Pharmacist [facility] [phone]

## 2019-09-25 NOTE — Patient Instructions (Signed)
Social Worker Visit Information  Goals we discussed today:  Goals Addressed            This Visit's Progress   . COMPLETED: Collaborate with RN Care Manager to perform approrpiate assessments to determine care coordination needs following recent IP admission       CARE PLAN ENTRY (see longitudinal plan of care for additional care plan information)  Current Barriers:  . Recent IP admission related to chest pain . History of CAD, HTN, and DM II  Social Work Clinical Goal(s):  [name] Over the next 30 days the patient will work with care management team and primary care provider to develop a plan to optimize management of chronic health conditions as evidenced by no ED visits over the next 60 days  CCM SW Interventions: Completed 09/25/2019 with the patient . Inter-disciplinary care team collaboration (see longitudinal plan of care) . Chart review performed to note patient referral to cardiology placed on 6/20 for Dr. Debara Pickett advanced lipid clinic to address hypercholesterolemia . Successful outbound call placed to the patient to assist with care coordination needs . Determined the patient was seen by cardiology 09/24/19 and approved to receive injections to assist with cholesterol management . Patient reported she will follow up with pharmacist within the cardiology practice on August 5th . Patient denies any acute needs at this time . Goal met  Patient Self Care Activities:  . Patient verbalizes understanding of plan to contact primary care team for acute needs prior to appointment on 4/26 . Self administers medications as prescribed . Attends all scheduled provider appointments . Performs ADL's independently . Performs IADL's independently . Calls provider office for new concerns or questions  Please see past updates related to this goal by clicking on the "Past Updates" button in the selected goal          Follow Up Plan: No SW follow up planned at this time. Please contact me with  future resource needs.  Daneen Schick, BSW, CDP Social Worker, Certified Dementia Practitioner [place] [facility] [place] [facility] [phone]

## 2019-09-25 NOTE — Chronic Care Management (AMB) (Signed)
Chronic Care Management    Social Work Follow Up Note  09/25/2019 Name: [name] MRN: [id number] DOB: 11-Sep-1945  [name] is a 74 y.o. year old female who is a primary care patient of Catherine Chard, MD. The CCM team was consulted for assistance with care coordination.   Review of patient status, including review of consultants reports, other relevant assessments, and collaboration with appropriate care team members and the patient's provider was performed as part of comprehensive patient evaluation and provision of chronic care management services.    SDOH (Social Determinants of Health) assessments performed: No    Outpatient Encounter Medications as of 09/25/2019  Medication Sig Note  . albuterol (PROVENTIL HFA;VENTOLIN HFA) 108 (90 Base) MCG/ACT inhaler Inhale 2 puffs into the lungs every 6 (six) hours as needed for wheezing or shortness of breath.   [name] amLODipine (NORVASC) 5 MG tablet Take 5 mg by mouth at bedtime.    [name] aspirin EC 81 MG tablet Take 81 mg by mouth daily.   [name] b complex vitamins tablet Take 1 tablet by mouth daily with lunch.    . Calcium Carb-Cholecalciferol (CALCIUM + D3 PO) Take 1 tablet by mouth at bedtime.   . Cholecalciferol (VITAMIN D3 PO) Take 1 tablet by mouth daily.   . cilostazol (PLETAL) 100 MG tablet Take 1 tablet (100 mg total) by mouth daily.   . furosemide (LASIX) 20 MG tablet Take 1 tablet (20 mg total) by mouth daily.   . hydrALAZINE (APRESOLINE) 10 MG tablet Take 1 tablet (10 mg total) by mouth every 8 (eight) hours. (Patient taking differently: Take 10 mg by mouth 3 (three) times daily with meals. )   . Ibuprofen-diphenhydrAMINE Cit (ADVIL PM PO) Take 1 tablet by mouth at bedtime as needed (sleep).   . isosorbide mononitrate (IMDUR) 60 MG 24 hr tablet Take 1 tablet (60 mg total) by mouth at bedtime.   [name] levothyroxine (SYNTHROID) 125 MCG tablet TAKE 1 TABLET DAILY ON MONDAY THRU FRIDAY   . Magnesium 250 MG TABS Take 1 tablet (250 mg  total) by mouth at bedtime.   . Multiple Vitamins-Minerals (MULTIVITAMIN WITH MINERALS) tablet Take 1 tablet by mouth daily with lunch.    . nitroGLYCERIN (NITROSTAT) 0.4 MG SL tablet Place 1 tablet (0.4 mg total) under the tongue every 5 (five) minutes x 3 doses as needed for chest pain. 07/13/2019: 1 tablet today  . olmesartan (BENICAR) 40 MG tablet Take 1 tablet (40 mg total) by mouth daily.   . potassium chloride (K-DUR) 10 MEQ tablet Take 10 mEq by mouth 2 (two) times daily with breakfast and lunch.    . Semaglutide (OZEMPIC, 0.25 OR 0.5 MG/DOSE, [place]) Inject 0.25 mg into the skin every Thursday.    No facility-administered encounter medications on file as of 09/25/2019.     Goals Addressed            This Visit's Progress   . COMPLETED: Collaborate with RN Care Manager to perform approrpiate assessments to determine care coordination needs following recent IP admission       CARE PLAN ENTRY (see longitudinal plan of care for additional care plan information)  Current Barriers:  . Recent IP admission related to chest pain . History of CAD, HTN, and DM II  Social Work Clinical Goal(s):  [name] Over the next 30 days the patient will work with care management team and primary care provider to develop a plan to optimize management of chronic health conditions as  evidenced by no ED visits over the next 60 days  CCM SW Interventions: Completed 09/25/2019 with the patient . Inter-disciplinary care team collaboration (see longitudinal plan of care) . Chart review performed to note patient referral to cardiology placed on 6/20 for Dr. Debara Pickett advanced lipid clinic to address hypercholesterolemia . Successful outbound call placed to the patient to assist with care coordination needs . Determined the patient was seen by cardiology 09/24/19 and approved to receive injections to assist with cholesterol management . Patient reported she will follow up with pharmacist within the cardiology practice on August  5th . Patient denies any acute needs at this time . Goal met  Patient Self Care Activities:  . Patient verbalizes understanding of plan to contact primary care team for acute needs prior to appointment on 4/26 . Self administers medications as prescribed . Attends all scheduled provider appointments . Performs ADL's independently . Performs IADL's independently . Calls provider office for new concerns or questions  Please see past updates related to this goal by clicking on the "Past Updates" button in the selected goal          Follow Up Plan: No SW follow up planned at this time. The patient will remain active with RN Care Manager and embedded PharmD.   Daneen Schick, BSW, CDP Social Worker, Certified Dementia Practitioner [place] [facility] [place] [facility] [phone]  Total time spent performing care coordination and/or care management activities with the patient by phone or face to face = 15 minutes.

## 2019-09-30 ENCOUNTER — Other Ambulatory Visit: Payer: Self-pay

## 2019-09-30 ENCOUNTER — Ambulatory Visit: Payer: Medicare PPO | Admitting: Internal Medicine

## 2019-09-30 ENCOUNTER — Encounter: Payer: Self-pay | Admitting: Internal Medicine

## 2019-09-30 VITALS — BP 118/74 | HR 60 | Temp 98.1°F | Ht 62.8 in | Wt 151.2 lb

## 2019-09-30 DIAGNOSIS — I1310 Hypertensive heart and chronic kidney disease without heart failure, with stage 1 through stage 4 chronic kidney disease, or unspecified chronic kidney disease: Secondary | ICD-10-CM | POA: Diagnosis not present

## 2019-09-30 DIAGNOSIS — N182 Chronic kidney disease, stage 2 (mild): Secondary | ICD-10-CM

## 2019-09-30 DIAGNOSIS — E1122 Type 2 diabetes mellitus with diabetic chronic kidney disease: Secondary | ICD-10-CM | POA: Diagnosis not present

## 2019-09-30 DIAGNOSIS — I2510 Atherosclerotic heart disease of native coronary artery without angina pectoris: Secondary | ICD-10-CM

## 2019-09-30 DIAGNOSIS — E039 Hypothyroidism, unspecified: Secondary | ICD-10-CM

## 2019-09-30 NOTE — Progress Notes (Signed)
This visit occurred during the SARS-CoV-2 public health emergency.  Safety protocols were in place, including screening questions prior to the visit, additional usage of staff PPE, and extensive cleaning of exam room while observing appropriate contact time as indicated for disinfecting solutions.  Subjective:     Patient ID: [name] , female    DOB: 07-03-1945 , 74 y.o.   MRN: [id number]   Chief Complaint  Patient presents with  . Diabetes  . Hypertension    HPI  Diabetes She presents for her follow-up diabetic visit. She has type 2 diabetes mellitus. Pertinent negatives for hypoglycemia include no headaches. Pertinent negatives for diabetes include no blurred vision. There are no hypoglycemic complications. Diabetic complications include heart disease and nephropathy. Risk factors for coronary artery disease include diabetes mellitus, dyslipidemia, hypertension and post-menopausal. She is following a diabetic diet. She participates in exercise intermittently. An ACE inhibitor/angiotensin II receptor blocker is being taken. Eye exam is current.  Hypertension This is a chronic problem. The current episode started more than 1 year ago. The problem has been gradually improving since onset. The problem is controlled. Pertinent negatives include no blurred vision or headaches. The current treatment provides moderate improvement. 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     Past Medical History:  Diagnosis Date  . Benign hypertensive renal disease   . CKD stage 2 due to type 2 diabetes mellitus ([place])   . Hypertension   . Hypothyroidism   . Obesity   . OSA on CPAP   . Pure hypercholesterolemia   . PVD (peripheral vascular disease) ([place])   . Vitamin D deficiency      Family History  Problem Relation Age of Onset  . Alzheimer's disease Mother   . Aneurysm Father   . Cancer Brother      Current Outpatient Medications:  .  albuterol (PROVENTIL  HFA;VENTOLIN HFA) 108 (90 Base) MCG/ACT inhaler, Inhale 2 puffs into the lungs every 6 (six) hours as needed for wheezing or shortness of breath., Disp: 1 Inhaler, Rfl: 2 .  amLODipine (NORVASC) 5 MG tablet, Take 5 mg by mouth at bedtime. , Disp: , Rfl:  .  aspirin EC 81 MG tablet, Take 81 mg by mouth daily., Disp: , Rfl:  .  b complex vitamins tablet, Take 1 tablet by mouth daily with lunch. , Disp: , Rfl:  .  Calcium Carb-Cholecalciferol (CALCIUM + D3 PO), Take 1 tablet by mouth at bedtime., Disp: , Rfl:  .  Cholecalciferol (VITAMIN D3 PO), Take 1 tablet by mouth daily., Disp: , Rfl:  .  cilostazol (PLETAL) 100 MG tablet, Take 1 tablet (100 mg total) by mouth daily., Disp: 90 tablet, Rfl: 2 .  furosemide (LASIX) 20 MG tablet, Take 1 tablet (20 mg total) by mouth daily., Disp: 90 tablet, Rfl: 2 .  hydrALAZINE (APRESOLINE) 10 MG tablet, Take 1 tablet (10 mg total) by mouth every 8 (eight) hours. (Patient taking differently: Take 10 mg by mouth 3 (three) times daily with meals. ), Disp: 90 tablet, Rfl: 3 .  isosorbide mononitrate (IMDUR) 60 MG 24 hr tablet, Take 1 tablet (60 mg total) by mouth at bedtime., Disp: , Rfl:  .  levothyroxine (SYNTHROID) 125 MCG tablet, TAKE 1 TABLET DAILY ON MONDAY THRU FRIDAY, Disp: 65 tablet, Rfl: 3 .  Magnesium 250 MG TABS, Take 1 tablet (250 mg total) by mouth at bedtime., Disp: 90 tablet, Rfl: 2 .  Multiple Vitamins-Minerals (MULTIVITAMIN WITH MINERALS) tablet, Take 1 tablet  by mouth daily with lunch. , Disp: , Rfl:  .  nitroGLYCERIN (NITROSTAT) 0.4 MG SL tablet, Place 1 tablet (0.4 mg total) under the tongue every 5 (five) minutes x 3 doses as needed for chest pain., Disp: 25 tablet, Rfl: 1 .  olmesartan (BENICAR) 40 MG tablet, Take 1 tablet (40 mg total) by mouth daily., Disp: 90 tablet, Rfl: 2 .  potassium chloride (K-DUR) 10 MEQ tablet, Take 10 mEq by mouth 2 (two) times daily with breakfast and lunch. , Disp: , Rfl: 3 .  Semaglutide (OZEMPIC, 0.25 OR 0.5 MG/DOSE,  [place]), Inject 0.25 mg into the skin every Thursday., Disp: , Rfl:    Allergies  Allergen Reactions  . Atorvastatin Swelling and Other (See Comments)    Unknown reaction  . Clopidogrel Other (See Comments)    Caused bruising  . Metoprolol Other (See Comments)    Slows heart rate to low  . Metoprolol Tartrate Swelling  . Pregabalin Swelling  . Rosuvastatin Calcium Other (See Comments)    Unknown reaction     Review of Systems  Constitutional: Negative.   Eyes: Negative for blurred vision.  Respiratory: Negative.   Cardiovascular: Negative.   Gastrointestinal: Negative.   Neurological: Negative.  Negative for headaches.  Psychiatric/Behavioral: Negative.      Today's Vitals   09/30/19 1003  BP: 118/74  Pulse: 60  Temp: 98.1 F (36.7 C)  TempSrc: Oral  Weight: 151 lb 3.2 oz (68.6 kg)  Height: 5' 2.8" (1.595 m)  PainSc: 0-No pain   Body mass index is 26.95 kg/m.   Objective:  Physical Exam Vitals and nursing note reviewed.  Constitutional:      Appearance: Normal appearance.  HENT:     Head: Normocephalic and atraumatic.  Cardiovascular:     Rate and Rhythm: Normal rate and regular rhythm.     Heart sounds: Normal heart sounds.  Pulmonary:     Effort: Pulmonary effort is normal.     Breath sounds: Normal breath sounds.  Skin:    General: Skin is warm.  Neurological:     General: No focal deficit present.     Mental Status: She is alert.  Psychiatric:        Mood and Affect: Mood normal.        Behavior: Behavior normal.         Assessment And Plan:     1. Type 2 diabetes mellitus with stage 2 chronic kidney disease, without long-term current use of insulin (HCC)  Chronic, I will check labs as listed below. She was congratulated on the lifestyle changes she has made thus far. I will make medication adjustments once her labs are available for review.   - Hemoglobin A1c - CMP14+EGFR  2. Benign hypertensive heart and renal disease  Chronic, well  controlled. She will continue with current meds. She is encouraged to avoid adding salt to her foods.   3. CAD in native artery  Chronic, yet stable. Also followed by Dr. Doylene Canard. Importance of following a heart healthy diet was discussed with the patient.   4. Primary hypothyroidism  Previous TSH 0.6, goal is 1.0. I will check a TSH today.   - TSH    [name], MD    THE PATIENT IS ENCOURAGED TO PRACTICE SOCIAL DISTANCING DUE TO THE COVID-19 PANDEMIC.

## 2019-10-01 DIAGNOSIS — E1165 Type 2 diabetes mellitus with hyperglycemia: Secondary | ICD-10-CM | POA: Diagnosis not present

## 2019-10-01 DIAGNOSIS — E119 Type 2 diabetes mellitus without complications: Secondary | ICD-10-CM | POA: Diagnosis not present

## 2019-10-01 DIAGNOSIS — Z794 Long term (current) use of insulin: Secondary | ICD-10-CM | POA: Diagnosis not present

## 2019-10-01 LAB — CMP14+EGFR
ALT: 14 IU/L (ref 0–32)
AST: 37 IU/L (ref 0–40)
Albumin/Globulin Ratio: 2.6 — ABNORMAL HIGH (ref 1.2–2.2)
Albumin: 4.6 g/dL (ref 3.7–4.7)
Alkaline Phosphatase: 48 IU/L (ref 48–121)
BUN/Creatinine Ratio: 11 — ABNORMAL LOW (ref 12–28)
BUN: 13 mg/dL (ref 8–27)
Bilirubin Total: 0.4 mg/dL (ref 0.0–1.2)
CO2: 22 mmol/L (ref 20–29)
Calcium: 9.9 mg/dL (ref 8.7–10.3)
Chloride: 103 mmol/L (ref 96–106)
Creatinine, Ser: 1.14 mg/dL — ABNORMAL HIGH (ref 0.57–1.00)
GFR calc Af Amer: 55 mL/min/{1.73_m2} — ABNORMAL LOW (ref >59)
GFR calc non Af Amer: 47 mL/min/{1.73_m2} — ABNORMAL LOW (ref >59)
Globulin, Total: 1.8 g/dL (ref 1.5–4.5)
Glucose: 98 mg/dL (ref 65–99)
Potassium: 4.4 mmol/L (ref 3.5–5.2)
Sodium: 139 mmol/L (ref 134–144)
Total Protein: 6.4 g/dL (ref 6.0–8.5)

## 2019-10-01 LAB — HEMOGLOBIN A1C
Est. average glucose Bld gHb Est-mCnc: 117 mg/dL
Hgb A1c MFr Bld: 5.7 % — ABNORMAL HIGH (ref 4.8–5.6)

## 2019-10-01 LAB — TSH: TSH: 2.7 u[IU]/mL (ref 0.450–4.500)

## 2019-10-08 ENCOUNTER — Telehealth: Payer: Self-pay

## 2019-10-12 NOTE — Patient Instructions (Signed)
Visit Information  Goals Addressed            This Visit's Progress   . Pharmacy Care Plan       .CARE PLAN ENTRY (see longitudinal plan of care for additional care plan information)  Current Barriers:  . Chronic Disease Management support, education, and care coordination needs related to Hypertension, Hyperlipidemia, and Diabetes   Hypertension BP Readings from Last 3 Encounters:  09/30/19 118/74  09/23/19 120/72  08/31/19 120/60   . Pharmacist Clinical Goal(s): o Over the next 180 days, patient will work with PharmD and providers to maintain BP goal <130/80 . Current regimen:   Amlodipine 5 mg at bedtime   Furosemide 20 mg daily  Hydralazine 10mg  every 8 hours  Olmesartan 40 mg daily . Interventions: o Counseled patient to take hydralazine doses 8 hours apart o Provided dietary and exercise recommendations . Patient self care activities - Over the next 180 days, patient will: o Check BP periodically and if symptomatic, document, and provide at future appointments o Ensure daily salt intake < 2300 mg/day o Exercise at least 30 minutes daily, 5 days per week  Hyperlipidemia Lab Results  Component Value Date/Time   LDLCALC 134 (H) 07/14/2019 04:33 AM   LDLCALC 116 (H) 06/02/2019 09:18 AM   . Pharmacist Clinical Goal(s): o Over the next 90 days, patient will work with PharmD and providers to achieve LDL goal < 70 . Current regimen:  o N/A . Interventions: o Provided dietary and exercise recommendations . Patient self care activities - Over the next 90 days, patient will: o Exercise at least 30 minutes daily, 5 days per week o Keep appointment with lipid clinic to address LDL and need for medication intervention  Diabetes Lab Results  Component Value Date/Time   HGBA1C 5.7 (H) 09/30/2019 10:48 AM   HGBA1C 5.6 06/02/2019 09:18 AM   . Pharmacist Clinical Goal(s): o Over the next 180 days, patient will work with PharmD and providers to maintain A1c goal  <7% . Current regimen:  o Ozempic 0.25 mg SQ Thursdays . Interventions: o Provided dietary and exercise recommendations o [name] patient assistance program regarding Ozempic - Next shipment processed 10/12/19. Delivery within 10-14 days (experiencing delays though) . Patient self care activities - Over the next 180 days, patient will: o Check blood sugar once daily, document, and provide at future appointments o Contact provider with any episodes of hypoglycemia o Exercise at least 30 minutes daily, 5 days per week  Medication management . Pharmacist Clinical Goal(s): o Over the next 90 days, patient will work with PharmD and providers to achieve optimal medication adherence . Current pharmacy: [name] . Interventions o Comprehensive medication review performed. o Continue current medication management strategy o Contacted [employer] mail and determined they do not offer pill packaging o Will perform medication cost review to compare [employer] and [facility] pharmacy (who does offer pill packaging) . Patient self care activities - Over the next 90 days, patient will: o Focus on medication adherence by continued use of pill box o Take medications as prescribed o Report any questions or concerns to PharmD and/or provider(s)  Initial goal documentation        [name] was given information about Chronic Care Management services today including:  1. CCM service includes personalized support from designated clinical staff supervised by her physician, including individualized plan of care and coordination with other care providers 2. 24/7 contact phone numbers for assistance for urgent and routine care  needs. 3. Standard insurance, coinsurance, copays and deductibles apply for chronic care management only during months in which we provide at least 20 minutes of these services. Most insurances cover these services at 100%, however patients may be responsible for any copay,  coinsurance and/or deductible if applicable. This service may help you avoid the need for more expensive face-to-face services. 4. Only one practitioner may furnish and bill the service in a calendar month. 5. The patient may stop CCM services at any time (effective at the end of the month) by phone call to the office staff.  Patient agreed to services and verbal consent obtained.   Patient verbalizes understanding of instructions provided today.  Telephone follow up appointment with pharmacy team member scheduled for: 12/01/19 @ 3:30 PM  Beryle Flock, PharmD Clinical Pharmacist [facility] [phone]

## 2019-10-16 ENCOUNTER — Telehealth: Payer: Self-pay

## 2019-10-16 NOTE — Telephone Encounter (Signed)
The pt was notified that her Ozempic has been delivered from [employer] patient assistance program and will be available for pickup on Monday.

## 2019-10-28 ENCOUNTER — Telehealth: Payer: Self-pay

## 2019-10-28 NOTE — Chronic Care Management (AMB) (Signed)
Chronic Care Management Pharmacy Assistant   Name: [name]  MRN: [id number] DOB: 1946-03-18  Reason for Encounter: Medication Review/ Follow up on Medication Packaging.  PCP : Catherine Peng, MD  Allergies:   Allergies  Allergen Reactions  . Atorvastatin Swelling and Other (See Comments)    Unknown reaction  . Clopidogrel Other (See Comments)    Caused bruising  . Metoprolol Other (See Comments)    Slows heart rate to low  . Metoprolol Tartrate Swelling  . Pregabalin Swelling  . Rosuvastatin Calcium Other (See Comments)    Unknown reaction    Medications: Outpatient Encounter Medications as of 10/28/2019  Medication Sig Note  . albuterol (PROVENTIL HFA;VENTOLIN HFA) 108 (90 Base) MCG/ACT inhaler Inhale 2 puffs into the lungs every 6 (six) hours as needed for wheezing or shortness of breath.   [name] amLODipine (NORVASC) 5 MG tablet Take 5 mg by mouth at bedtime.    [name] aspirin EC 81 MG tablet Take 81 mg by mouth daily.   [name] b complex vitamins tablet Take 1 tablet by mouth daily with lunch.    . Calcium Carb-Cholecalciferol (CALCIUM + D3 PO) Take 1 tablet by mouth at bedtime.   . Cholecalciferol (VITAMIN D3 PO) Take 1 tablet by mouth daily.   . cilostazol (PLETAL) 100 MG tablet Take 1 tablet (100 mg total) by mouth daily.   . furosemide (LASIX) 20 MG tablet Take 1 tablet (20 mg total) by mouth daily.   . hydrALAZINE (APRESOLINE) 10 MG tablet Take 1 tablet (10 mg total) by mouth every 8 (eight) hours. (Patient taking differently: Take 10 mg by mouth 3 (three) times daily with meals. )   . isosorbide mononitrate (IMDUR) 60 MG 24 hr tablet Take 1 tablet (60 mg total) by mouth at bedtime.   [name] levothyroxine (SYNTHROID) 125 MCG tablet TAKE 1 TABLET DAILY ON MONDAY THRU FRIDAY   . Magnesium 250 MG TABS Take 1 tablet (250 mg total) by mouth at bedtime.   . Multiple Vitamins-Minerals (MULTIVITAMIN WITH MINERALS) tablet Take 1 tablet by mouth daily with lunch.    . nitroGLYCERIN  (NITROSTAT) 0.4 MG SL tablet Place 1 tablet (0.4 mg total) under the tongue every 5 (five) minutes x 3 doses as needed for chest pain. 07/13/2019: 1 tablet today  . olmesartan (BENICAR) 40 MG tablet Take 1 tablet (40 mg total) by mouth daily.   . potassium chloride (K-DUR) 10 MEQ tablet Take 10 mEq by mouth 2 (two) times daily with breakfast and lunch.    . Semaglutide (OZEMPIC, 0.25 OR 0.5 MG/DOSE, [place]) Inject 0.25 mg into the skin every Thursday.    No facility-administered encounter medications on file as of 10/28/2019.    Current Diagnosis: Patient Active Problem List   Diagnosis Date Noted  . Acute bronchitis 06/06/2018  . Type 2 diabetes mellitus with stage 2 chronic kidney disease, without long-term current use of insulin (HCC) 02/17/2018  . Chronic renal disease, stage II 02/17/2018  . Benign hypertensive heart and renal disease 02/17/2018  . Primary hypothyroidism 02/17/2018  . Localized osteoarthritis of right knee 02/17/2018  . Essential hypertension 06/12/2017  . Acute coronary syndrome (HCC) 12/26/2016  . Hypertensive heart disease without heart failure 10/06/2015  . Insomnia with sleep apnea 10/06/2015  . OSA on CPAP 10/06/2015  . 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 CAD in native artery 10/06/2015  . Chest pain at rest 07/18/2015    Class: Acute  . Weakness 07/18/2015  Class: Acute  . Insomnia 05/05/2015  . Primary snoring 05/05/2015     Follow-Up:  Medication Cost Review- Spoke with patient, informed about cost review completed by Pharmacist Beryle Flock.  She is aware that [facility] is unable to match the price of [employer] mail order, she gets her prescriptions free through this service. [employer] also does not offer pill packaging for mail order patients which [name] would prefer to have. She is aware that [facility] can do packaging but it would cost her more money than using her mail order pharmacy. Patient aware and ok with information relayed. Inquired if  she was out of any medications and patient was not sure at the moment, she states she has so much going on that she is not really sure what she needs right now. Offered assistance in contacting [employer] to help coordinate her medication refills if she notices she is out.   Billee Cashing, CMA Clinical Pharmacist Assistant [phone]

## 2019-10-29 ENCOUNTER — Other Ambulatory Visit: Payer: Self-pay

## 2019-10-29 ENCOUNTER — Ambulatory Visit (INDEPENDENT_AMBULATORY_CARE_PROVIDER_SITE_OTHER): Payer: Medicare PPO | Admitting: Pharmacist

## 2019-10-29 DIAGNOSIS — T466X5A Adverse effect of antihyperlipidemic and antiarteriosclerotic drugs, initial encounter: Secondary | ICD-10-CM

## 2019-10-29 DIAGNOSIS — I2510 Atherosclerotic heart disease of native coronary artery without angina pectoris: Secondary | ICD-10-CM

## 2019-10-29 DIAGNOSIS — E785 Hyperlipidemia, unspecified: Secondary | ICD-10-CM | POA: Diagnosis not present

## 2019-10-29 DIAGNOSIS — G720 Drug-induced myopathy: Secondary | ICD-10-CM | POA: Diagnosis not present

## 2019-10-29 NOTE — Progress Notes (Signed)
Patient ID: [name]                 DOB: 16-Feb-1946                    MRN: [id number]     HPI: [name] is a 74 y.o. female patient referred to lipid clinic by Dr Flora Lipps. [facility] is significant for hyperlipidemia, DM, hypertension, OSA, PVD and CAD (50% stenosed Ost 2nd Mrg, 70% stenosed Ost 1st Diag, and 40% stenosed Ost cx to Mid Cx), and history of chest pain. Noted intolerance to statins and bempedoic acid. She present today for potential PCSKi9 initiation  And counseling.   Current Medications: none  Intolerances:  Atorvastatin - swelling Rosuvastatin 20mg  daily - cramping arms and legs simvastatin 20mg   bempedoic acid/ezetimibe 180-10mg  - severe weakness, fatigue, muscle aches  LDL goal: 70mg /dL  Diet: follow up with nutritionist for low fat and low carb diet  Exercise: activities of daily living  Family History: The patient's family history includes Alzheimer's disease in her mother; Aneurysm in her father; Cancer in her brother.  Social History: denies tobacco use, occasional alcohol  Labs: 07/14/19: CHO 199, TG 33, HDL 58, LDL-c 134   Past Medical History:  Diagnosis Date   Benign hypertensive renal disease    CKD stage 2 due to type 2 diabetes mellitus (HCC)    Hypertension    Hypothyroidism    Obesity    OSA on CPAP    Pure hypercholesterolemia    PVD (peripheral vascular disease) (HCC)    Vitamin D deficiency     Current Outpatient Medications on File Prior to Visit  Medication Sig Dispense Refill   albuterol (PROVENTIL HFA;VENTOLIN HFA) 108 (90 Base) MCG/ACT inhaler Inhale 2 puffs into the lungs every 6 (six) hours as needed for wheezing or shortness of breath. 1 Inhaler 2   amLODipine (NORVASC) 5 MG tablet Take 5 mg by mouth at bedtime.      aspirin EC 81 MG tablet Take 81 mg by mouth daily.     b complex vitamins tablet Take 1 tablet by mouth daily with lunch.      bisacodyl (DULCOLAX) 5 MG EC tablet Take 5 mg by mouth  daily as needed for moderate constipation.     Calcium Carb-Cholecalciferol (CALCIUM + D3 PO) Take 1 tablet by mouth at bedtime.     Cholecalciferol (VITAMIN D3 PO) Take 1 tablet by mouth daily.     cilostazol (PLETAL) 100 MG tablet Take 1 tablet (100 mg total) by mouth daily. 90 tablet 2   furosemide (LASIX) 20 MG tablet Take 1 tablet (20 mg total) by mouth daily. 90 tablet 2   hydrALAZINE (APRESOLINE) 10 MG tablet Take 1 tablet (10 mg total) by mouth every 8 (eight) hours. (Patient taking differently: Take 10 mg by mouth 3 (three) times daily with meals. ) 90 tablet 3   isosorbide mononitrate (IMDUR) 60 MG 24 hr tablet Take 1 tablet (60 mg total) by mouth at bedtime.     levothyroxine (SYNTHROID) 125 MCG tablet TAKE 1 TABLET DAILY ON MONDAY THRU FRIDAY 65 tablet 3   Magnesium 250 MG TABS Take 1 tablet (250 mg total) by mouth at bedtime. 90 tablet 2   Multiple Vitamins-Minerals (MULTIVITAMIN WITH MINERALS) tablet Take 1 tablet by mouth daily with lunch.      olmesartan (BENICAR) 40 MG tablet Take 1 tablet (40 mg total) by mouth daily. 90 tablet 2   potassium  chloride (K-DUR) 10 MEQ tablet Take 10 mEq by mouth 2 (two) times daily with breakfast and lunch.   3   psyllium (METAMUCIL) 58.6 % packet Take 1 packet by mouth daily.     Semaglutide (OZEMPIC, 0.25 OR 0.5 MG/DOSE, [place]) Inject 0.25 mg into the skin every Thursday.     nitroGLYCERIN (NITROSTAT) 0.4 MG SL tablet Place 1 tablet (0.4 mg total) under the tongue every 5 (five) minutes x 3 doses as needed for chest pain. (Patient not taking: Reported on 10/29/2019) 25 tablet 1   No current facility-administered medications on file prior to visit.    Allergies  Allergen Reactions   Atorvastatin Swelling and Other (See Comments)    Unknown reaction   Clopidogrel Other (See Comments)    Caused bruising   Metoprolol Other (See Comments)    Slows heart rate to low   Metoprolol Tartrate Swelling   Pregabalin Swelling    Rosuvastatin Calcium Other (See Comments)    Unknown reaction    Hyperlipidemia LDL remains above goal for secondary prevention and patient is unable to tolerate statins. Noted failed trail with bempedoic acid as well.   We discussed Repatha/Praluent MOA, storage, administration, prior-authorization, patient assistance program, and monitoring.   Will initiate paperwork for PCSK9i every 14 days and follow up as needed. Plan to repeat fasting blood work after 4 doses of new medication received.    Kadesia Robel Rodriguez-Guzman PharmD, BCPS, CPP [facility] [address] [place] [address] 10/30/2019 12:13 PM

## 2019-10-29 NOTE — Patient Instructions (Addendum)
Your Results:             Your most recent labs Goal  Total Cholesterol 199 < 200  Triglycerides 33 < 150  HDL (happy/good cholesterol) 58 > 40  LDL (lousy/bad cholesterol 134 < 70     Medication changes: * Will start paperwork for Repatha /Praluent every 14 days*  Lab orders: * Will repeat fasting blood work after 4th injection of new product*  Clinic phone number: *Merrilee Ancona/Kristin/[place]* [phone]  Patient Assistance:  The [employer] foundation offers assistance to help pay for medication copays.  They will cover copays for all cholesterol lowering meds, including statins, fibrates, omega-3 oils, ezetimibe, Repatha, Praluent, Nexletol, Nexlizet.  The cards are usually good for $2,500 or 12 months, whichever comes first. 1. Go to healthwellfoundation.org 2. Click on "Apply Now" 3. Answer questions as to whom is applying (patient or representative) 4. Your disease fund will be "hypercholesterolemia - Medicare access" 5. They will ask questions about finances and which medications you are taking for cholesterol 6. When you submit, the approval is usually within minutes.  You will need to print the card information from the site 7. You will need to show this information to your pharmacy, they will bill your Medicare Part D plan first -then bill [employer] --for the copay.   You can also call them at [phone], although the hold times can be quite long.   Thank you for choosing [facility]

## 2019-10-30 ENCOUNTER — Encounter: Payer: Self-pay | Admitting: Pharmacist

## 2019-10-30 DIAGNOSIS — E785 Hyperlipidemia, unspecified: Secondary | ICD-10-CM | POA: Insufficient documentation

## 2019-10-30 DIAGNOSIS — G720 Drug-induced myopathy: Secondary | ICD-10-CM | POA: Insufficient documentation

## 2019-10-30 NOTE — Assessment & Plan Note (Signed)
LDL remains above goal for secondary prevention and patient is unable to tolerate statins. Noted failed trail with bempedoic acid as well.   We discussed Repatha/Praluent MOA, storage, administration, prior-authorization, patient assistance program, and monitoring.   Will initiate paperwork for PCSK9i every 14 days and follow up as needed. Plan to repeat fasting blood work after 4 doses of new medication received.

## 2019-11-04 ENCOUNTER — Telehealth: Payer: Self-pay

## 2019-11-04 MED ORDER — REPATHA SURECLICK 140 MG/ML ~~LOC~~ SOAJ: 140.0000 mg | SUBCUTANEOUS | 11 refills | Status: DC

## 2019-11-04 NOTE — Telephone Encounter (Signed)
Called and spoke with pt and stated that they should start the repatha, rx sent, [employer] activated and emailed to patient, pt voiced understanding.

## 2019-11-12 ENCOUNTER — Telehealth: Payer: Self-pay

## 2019-11-12 NOTE — Telephone Encounter (Signed)
The pt said that she got a call and it said that they needed her medicare number for a back brace, the pt said that they verified some of her insurance number, her PCP and address.  The pt was told that the call didn't come from Dr. Allyne Gee office.  The pt said that the caller ID had cone on it.  The pt was told that when Dr Allyne Gee gets a faxes about back braces and stuff like that she has someone call the pt to see if they really requested it and that it's not a scam.  The pt was instructed to be careful about giving out her information.

## 2019-11-16 ENCOUNTER — Telehealth: Payer: Self-pay

## 2019-11-16 NOTE — Telephone Encounter (Signed)
Called and spoke w/pt and stated that they will receive their information by mail for the [employer] foundation, pt voiced understanding

## 2019-11-16 NOTE — Telephone Encounter (Signed)
-----   Message from Rosalee Kaufman, RPH-CPP sent at 11/13/2019  3:10 PM EDT ----- Patient can't find email with [employer] information.  Could you please share with her in some other method - apparently she has too many emails to look thru and find it  :(  Sorry, Belenda Cruise

## 2019-11-17 ENCOUNTER — Telehealth: Payer: Self-pay

## 2019-11-17 NOTE — Telephone Encounter (Signed)
The pt called yesterday and saidshe is getting calls and that the number on the caller ID says our office and asking about back braces and trying to get her medicare and social number. I told her that I didn't see where any one called from here and for her not to give out her information. yesterday she said she got a call about medication and I looked in her chart and told her that it was from her cardiologist office and she said yes that she spoke to a haylei.  I'M IN HER CHART TODAY DOCUMENTING THE CONVERSATION THAT I HAD WITH THE PT YESTERDAY BECAUSE I THOUGHT I DID BUT AFTER I WAS ASKED DID I SPEAK TO HER IT WAS NOTICED THAT I DIDN'T DOCUMENT THE CALL FROM YESTERDAY AND THE OFFICE MANAGER MISTI SAID TO PLEASE DOCUMENT THE CALL.

## 2019-11-20 ENCOUNTER — Telehealth: Payer: Self-pay

## 2019-11-25 ENCOUNTER — Telehealth: Payer: Medicare PPO

## 2019-11-25 ENCOUNTER — Telehealth: Payer: Self-pay

## 2019-11-25 NOTE — Telephone Encounter (Signed)
  Chronic Care Management   Outreach Note  11/25/2019 Name: [name] MRN: [id number] DOB: May 21, 1945  Referred by: [name], MD Reason for referral : Care Coordination   Collaboration with CMA Delana Meyer who requested SW contact patient. SW attempted to contact the patient with both numbers on file. Voice message left on patient cell number requesting a return call.  Follow Up Plan: No SW follow up planned at this time. SW is available to engage with the patient upon receiving a return call.  [name], BSW, CDP Social Worker, Certified Dementia Practitioner [facility] Care Management [phone]

## 2019-12-01 ENCOUNTER — Other Ambulatory Visit: Payer: Self-pay

## 2019-12-01 ENCOUNTER — Ambulatory Visit: Payer: Self-pay

## 2019-12-01 DIAGNOSIS — E1122 Type 2 diabetes mellitus with diabetic chronic kidney disease: Secondary | ICD-10-CM

## 2019-12-01 DIAGNOSIS — I10 Essential (primary) hypertension: Secondary | ICD-10-CM

## 2019-12-01 DIAGNOSIS — E7800 Pure hypercholesterolemia, unspecified: Secondary | ICD-10-CM

## 2019-12-01 NOTE — Chronic Care Management (AMB) (Signed)
Chronic Care Management Pharmacy  Name: [name]  MRN: [id number] DOB: 1945-04-29  Chief Complaint/ HPI  [name],  74 y.o. , female presents for their Follow-Up CCM visit with the clinical pharmacist via telephone due to COVID-19 Pandemic.  PCP : Glendale Chard, MD  Their chronic conditions include: HTN, CAD, Type 2 DM, Hypothyroidism, Insomnia, Osteoarthritis of right knee.   Office Visits: 10/16/19 Telephone call: Pt notified Ozempic delivered from patient assistance program.   09/30/19 OV: DM/HTN check. HgbA1c stable at 5.7%. Kidney function has decreased slightly. Advised pt to stay well hydrated. Thyroid function stable. Continue current medications.   08/31/2019 OV: Patient in for a cholesterol check. Patient stopped taking Nexlizet due to muscle weakness and cramps. Dr. Baird Cancer referring to advanced lipid clinic for potential candidate for [place]. Nitrofurantoin 100 mg bid for UTI.   07/20/2019 OV: Patient f/u after hospital for chest pain. Dr. Baird Cancer provided patient with chest stretching exercise and recommended Aspercreme. Cholesterol elevated above goal - consider use of Nexlizet due to hx of statin intolerance.   05/27/2019 AWV and OV: Annual exam and DM/HTN check. Diabetic foot exam performed. No medication changes.  Consult Visits: 11/16/19 Cardiology telephone call: Notified pt she will received [place] information by mail.   11/04/19 Cardiology telephone call: Called and advised pt to start [place]. Rx sent to pharmacy. [employer] activated and emailed to patient.   10/29/19 Cardiology OV w/ Dr. Carrolyn Leigh (lipid clinic): LDL remains above goal for secondary prevention. Repatha/Praluent discussed in detail. Will initiate paperwork for PCSK9i every 14 days and follow up as needed. Plan to repeat fasting blood work after 4 doses of medication.   09/23/19 Cardiology OV w/ Dr. Audie Box: CAD stable. Referred to lipid clinic for PCSK9  inhibitor due to inability to tolerate statin therapy. Refilled cilostazol. No medication changes.   07/13/2019-07/14/2019 - Hospital visit for ACS. Troponin 1 levels were normal. Nuclear stress test no reversible ischemia and EF 68%.   CCM Visits: 08/04/2019 SW: f/u on improved muscle soreness. Patient had chest pain 08/03/2019 and took NTG x2 doses.   07/15/2019 TOC phone call: No med changes. F/U with Dr. Baird Cancer 04/26.   07/15/2019 SW: discussed recent hospitalization with patient. Patient reports cardiologist had recommended pacemaker due to pain. Negative cardiac workup. Patient to follow-up with cardiologist and PCP.   06/25/2019 RN: Discussed medications and lifestyle modifications.   05/04/2019 PharmD: Ozempic PAP submitted and samples provided. Encouraged patient to call in refills of hypertensive medications.   Medications: Outpatient Encounter Medications as of 12/01/2019  Medication Sig Note  . acidophilus (RISAQUAD) CAPS capsule Take 1 capsule by mouth daily. [facility] 24/7 Probiotic   . amLODipine (NORVASC) 5 MG tablet Take 5 mg by mouth at bedtime.    [name] aspirin EC 81 MG tablet Take 81 mg by mouth daily.   [name] b complex vitamins tablet Take 1 tablet by mouth daily with lunch.    . Calcium Carb-Cholecalciferol (CALCIUM + D3 PO) Take 1 tablet by mouth at bedtime.   . cholecalciferol (VITAMIN D) 25 MCG (1000 UNIT) tablet Take 1 tablet by mouth daily.    . cilostazol (PLETAL) 100 MG tablet Take 1 tablet (100 mg total) by mouth daily.   . furosemide (LASIX) 20 MG tablet Take 1 tablet (20 mg total) by mouth daily.   . hydrALAZINE (APRESOLINE) 10 MG tablet Take 1 tablet (10 mg total) by mouth every 8 (eight) hours. (Patient taking differently: Take 10 mg by mouth 3 (  three) times daily with meals. )   . isosorbide mononitrate (IMDUR) 60 MG 24 hr tablet Take 1 tablet (60 mg total) by mouth at bedtime.   [name] levothyroxine (SYNTHROID) 125 MCG tablet TAKE 1 TABLET DAILY ON MONDAY THRU FRIDAY    . Magnesium 250 MG TABS Take 1 tablet (250 mg total) by mouth at bedtime.   . Multiple Vitamins-Minerals (EMERGEN-C IMMUNE PO) Take 1 tablet by mouth daily.   . Multiple Vitamins-Minerals (MULTIVITAMIN WITH MINERALS) tablet Take 1 tablet by mouth daily with lunch.    . olmesartan (BENICAR) 40 MG tablet Take 1 tablet (40 mg total) by mouth daily.   . potassium chloride (K-DUR) 10 MEQ tablet Take 10 mEq by mouth 2 (two) times daily with breakfast and lunch.    . Semaglutide (OZEMPIC, 0.25 OR 0.5 MG/DOSE, [place]) Inject 0.25 mg into the skin every Thursday.   [name] albuterol (PROVENTIL HFA;VENTOLIN HFA) 108 (90 Base) MCG/ACT inhaler Inhale 2 puffs into the lungs every 6 (six) hours as needed for wheezing or shortness of breath. (Patient not taking: Reported on 12/01/2019)   . bisacodyl (DULCOLAX) 5 MG EC tablet Take 5 mg by mouth daily as needed for moderate constipation. (Patient not taking: Reported on 12/01/2019)   . Evolocumab (REPATHA SURECLICK) 814 MG/ML SOAJ Inject 140 mg into the skin every 14 (fourteen) days. (Patient not taking: Reported on 12/01/2019)   . nitroGLYCERIN (NITROSTAT) 0.4 MG SL tablet Place 1 tablet (0.4 mg total) under the tongue every 5 (five) minutes x 3 doses as needed for chest pain. (Patient not taking: Reported on 10/29/2019) 07/13/2019: 1 tablet today  . psyllium (METAMUCIL) 58.6 % packet Take 1 packet by mouth daily. (Patient not taking: Reported on 12/01/2019)    No facility-administered encounter medications on file as of 12/01/2019.   Allergies  Allergen Reactions  . Atorvastatin Swelling and Other (See Comments)    Unknown reaction  . Clopidogrel Other (See Comments)    Caused bruising  . Metoprolol Other (See Comments)    Slows heart rate to low  . Metoprolol Tartrate Swelling  . Pregabalin Swelling  . Rosuvastatin Calcium Other (See Comments)    Unknown reaction   Current Diagnosis/Assessment: SDOH Interventions     Most Recent Value  SDOH Interventions  Financial  Strain Interventions Other (Comment)  [Provided [place] billing information to [employer] and confirmed $0 copay. Pt notified]      SDOH Interventions     Most Recent Value  SDOH Interventions  Financial Strain Interventions Other (Comment)  [Provided [place] billing information to [employer] and confirmed $0 copay. Pt notified]      Goals Addressed            This Visit's Progress   . Pharmacy Care Plan       .CARE PLAN ENTRY (see longitudinal plan of care for additional care plan information)  Current Barriers:  . Chronic Disease Management support, education, and care coordination needs related to Hypertension, Hyperlipidemia, and Diabetes   Hypertension BP Readings from Last 3 Encounters:  09/30/19 118/74  09/23/19 120/72  08/31/19 120/60   . Pharmacist Clinical Goal(s): o Over the next 180 days, patient will work with PharmD and providers to maintain BP goal <130/80 . Current regimen:   Amlodipine 5 mg at bedtime   Furosemide 20 mg daily  Hydralazine 80m every 8 hours  Olmesartan 40 mg daily . Interventions: o Counseled patient to take hydralazine doses 8 hours apart o Provided dietary and exercise  recommendations . Patient self care activities - Over the next 180 days, patient will: o Check BP periodically and if symptomatic, document, and provide at future appointments o Ensure daily salt intake < 2300 mg/day o Exercise at least 30 minutes daily, 5 days per week  Hyperlipidemia Lab Results  Component Value Date/Time   LDLCALC 134 (H) 07/14/2019 04:33 AM   LDLCALC 116 (H) 06/02/2019 09:18 AM   . Pharmacist Clinical Goal(s): o Over the next 90 days, patient will work with PharmD and providers to achieve LDL goal < 70 . Current regimen:  o Repatha 139m every 14 days . Interventions: o Provided dietary and exercise recommendations o Determined patient has not started Repatha due to waiting on [employer] in the  mail o [place] and provided billing information to [employer] Confirmed $0 copay for Repatha o Notified patient that Repatha is available for pickup . Patient self care activities - Over the next 90 days, patient will: o Exercise at least 30 minutes daily, 5 days per week o Continue to follow cholesterol lowering diet as provided by [facility]  Diabetes Lab Results  Component Value Date/Time   HGBA1C 5.7 (H) 09/30/2019 10:48 AM   HGBA1C 5.6 06/02/2019 09:18 AM   . Pharmacist Clinical Goal(s): o Over the next 180 days, patient will work with PharmD and providers to maintain A1c goal <7% . Current regimen:  o Ozempic 0.25 mg SQ Thursdays . Interventions: o Provided dietary and exercise recommendations o Confirmed patient received Ozempic patient assistance shipment in July . Patient self care activities - Over the next 180 days, patient will: o Check blood sugar once daily, document, and provide at future appointments o Contact provider with any episodes of hypoglycemia o Exercise at least 30 minutes daily, 5 days per week  Medication management . Pharmacist Clinical Goal(s): o Over the next 90 days, patient will work with PharmD and providers to achieve optimal medication adherence . [facility]. Interventions o Comprehensive medication review performed. o Continue current medication management strategy o Determined [facility] [facility] does NOT offer pill packaging at this time o Discussed with patient that [facility] does offer medication synchronization, adherence packaging and delivery, but it is more expensive than [facility] [facility] medications . Patient self care activities - Over the next 90 days, patient will: o Focus on medication adherence by continued use of pill box o Take medications as prescribed o Report any questions or concerns to PharmD and/or provider(s)  Please see past updates related to this goal by  clicking on the "Past Updates" button in the selected goal         Hyperlipidemia   LDL goal < 70  Lipid Panel     Component Value Date/Time   CHOL 199 07/14/2019 0433   CHOL 199 06/02/2019 0918   TRIG 33 07/14/2019 0433   HDL 58 07/14/2019 0433   HDL 68 06/02/2019 0918   LDLCALC 134 (H) 07/14/2019 0433   LDLCALC 116 (H) 06/02/2019 0918    Hepatic Function Latest Ref Rng & Units 09/30/2019 07/13/2019 06/02/2019  Total Protein 6.0 - 8.5 g/dL 6.4 6.3(L) 6.6  Albumin 3.7 - 4.7 g/dL 4.6 4.1 4.5  AST 0 - 40 IU/L 37 43(H) 38  ALT 0 - 32 IU/L _0 Alk Phosphatase 48 - 121 IU/L 48 45 58  Total Bilirubin 0.0 - 1.2 mg/dL 0.4 0.5 0.3     The 10-year ASCVD risk score (Mikey Bussing  DC Jr., et al., 2013) is: 28.6%   Values used to calculate the score:     Age: 55 years     Sex: Female     Is Non-Hispanic African American: Yes     Diabetic: Yes     Tobacco smoker: No     Systolic Blood Pressure: 062 mmHg     Is BP treated: Yes     HDL Cholesterol: 58 mg/dL     Total Cholesterol: 199 mg/dL   Patient has failed these meds in past: niacin, omega 3, rosuvastatin, simvastatin, coq10, Nexlizet, [place] Patient is currently uncontrolled on the following medications:  . Repatha 140 mg/ml every 14 days  We discussed:    Pt was prescribed Repatha by lipid clinic and [place] was applied for and approved  Pt has not picked up [place] from [facility] because she has been waiting on [employer] information in the mail  Was able look up pt's pharmacy card in [place] on [address] and provided [employer] information for [employer]. Confirmed $0 copay with grant  Notified pt that billing information was provided to [facility] and she could pick up [place] tomorrow and it will be no charge . Diet extensively o Trying to follow cholesterol lowering diet o Pt is keeping a food log o Pt says that [facility] is supposed to be following up with  her regarding diet o Pt is eating 1/2 cup beans daily, limiting dairy products and processed meats, eating plenty of leafy green vegetables, broiled meats o Recommend balanced diet and limiting portion control o Drinking water (4-5 bottles per day) and milk . Exercise extensively o Walks a lot in her house daily o Recommend pt get 30 minutes of moderate intensity exercise daily 5 times a week (150 minutes total per week)  Plan Continue current medications and control with diet and exercise   Diabetes   Recent Relevant Labs: Lab Results  Component Value Date/Time   HGBA1C 5.7 (H) 09/30/2019 10:48 AM   HGBA1C 5.6 06/02/2019 09:18 AM   MICROALBUR 10 05/27/2019 11:30 AM   MICROALBUR 30 05/21/2018 10:56 AM    Kidney Function Lab Results  Component Value Date/Time   CREATININE 1.14 (H) 09/30/2019 10:48 AM   CREATININE 1.03 (H) 07/14/2019 04:33 AM   GFRNONAA 47 (L) 09/30/2019 10:48 AM   GFRAA 55 (L) 09/30/2019 10:48 AM   K 4.4 09/30/2019 10:48 AM   K 4.0 07/14/2019 04:33 AM   Checking BG: Daily  Recent FBG Readings: 230, 128,141, 116, 115, 91, 112, 110, 95, 93 Recent pre-meal BG readings:  Recent 2hr PP BG readings:   Recent HS BG readings:  Patient has failed these meds in past: Lantus, cinnamon Patient is currently controlled on the following medications:   Ozempic 0.25 mg SQ Thursdays  Aspirin 55m daily  Last diabetic Foot exam: 05/27/19  Last diabetic Eye exam: Lab Results  Component Value Date/Time   HMDIABEYEEXA No Retinopathy 02/27/2019 12:00 AM    We discussed: . Diet and exercise extensively . Determined Ozempic PAP shipment was delivered and picked up in July . Pt had a couple of elevated BG readings but they are back down now  Plan Continue current medications   Angina   Patient has failed these meds in past: N/A Patient is currently controlled on the following medications:   Isosorbide mononitrate 60 mg at bedtime  Nitroglycerin 0.4 mg prn  We  discussed:  Pt has chest pain every  week or 2 (not consistently), only takes nitroglycerin when it gets really bad  Plan Continue current medications  Hypertension   Office blood pressures are  BP Readings from Last 3 Encounters:  09/30/19 118/74  09/23/19 120/72  08/31/19 120/60   Patient has failed these meds in the past: Metoprolol succinate Patient is currently controlled on the following medications:   Amlodipine 5 mg at bedtime   Furosemide 20 mg daily  Hydralazine 32m every 8 hours  Olmesartan 40 mg daily  Patient checks BP at home infrequently  Patient home BP readings are ranging: None to provide  We discussed:  Discussed taking hydralazine doses 8 hours apart  Plan Continue current medications   Peripheral Vascular Disease   Patient has failed these meds in past: N/A Patient is currently controlled on the following medications:  . Cilostazol 100 mg daily  Plan Continue current medications  Osteopenia / Osteoporosis    Last DEXA Scan: Ordered 04/29/18, expired  Vit D, 25-Hydroxy  Date Value Ref Range Status  06/02/2019 44.9 30.0 - 100.0 ng/mL Final    Comment:    Vitamin D deficiency has been defined by the [place] Endocrine Society practice guideline as a level of serum 25-OH vitamin D less than 20 ng/mL (1,2). The Endocrine Society went on to further define vitamin D insufficiency as a level between 21 and 29 ng/mL (2). 1. IOM (Institute of Medicine). 2010. Dietary reference    intakes for calcium and D. [place] The    [employer] 2. Holick MF, Binkley [place], Bischoff-Ferrari HA, et al.    Evaluation, treatment, and prevention of vitamin D    deficiency: an Endocrine Society clinical practice    guideline. JCEM. 2011 Jul; 96(7):1911-30.     Patient has failed these meds in past: N/A Patient is currently controlled on the following medications:   Calcium carbonate 6029mwith Vitamin D daily at  bedtime  Vitamin D3 1000 units daily  We discussed:  Recommend [phone] units of vitamin D daily. Recommend 1200 mg of calcium daily from dietary and supplemental sources. Recommend weight-bearing and muscle strengthening exercises for building and maintaining bone density.  Plan Continue current medications  Recommend DEXA scan   Hypothyroidism  S/P thyroidectomy  Lab Results  Component Value Date/Time   TSH 2.700 09/30/2019 10:48 AM   TSH 0.602 06/02/2019 09:18 AM   FREET4 1.75 06/02/2019 09:18 AM   FREET4 1.10 06/23/2018 09:44 AM   Patient has failed these meds in past: n/a Patient is currently controlled on the following medications:  . Levothyroxine 125 mcg daily Monday through Friday  Plan Continue current medications  Constipation/Irregularity   Patient has failed these meds in past: Dulcolax, enemas, chewable Dulcolax, Miralax, Metamucil, Citrucel, Prunes, Psyllium fiber Patient is currently controlled on the following medications:  Probiotic daily  We discussed:    Pt started taking probiotic yesterday ([facility] 24/7 Probiotic 5 billion active cultures per capsule) and passed stool yesterday with no issue  Discussed that probiotics are helpful with regularity  Pt stopped docusate because it was not helping  Pt stopped Metamucil (pt felt like it was causing more constipation)  Plan Continue current medications   Health Maintenance   Patient is currently on the following medications:  . Marland Kitchenultivitamin daily with lunch   Magnesium 250 mg daily at bedtime . Vitamin B complex daily with lunch  . Potassium Chloride 10 meq bid with breakfast and lunch . Albuterol 9017mact 2 puffs every  6 hours as needed for shortness of breath  . Emergen-C daily  We discussed:  Very rarely using albuterol  Plan Continue current medications   Vaccines   Reviewed and discussed patient's vaccination history.  No record on [facility]  Immunization History  Administered  Date(s) Administered  . Influenza, High Dose Seasonal PF 03/30/2014, 01/31/2015, 04/04/2016, 12/18/2016, 11/13/2018  . Influenza-Unspecified 01/24/2013, 12/21/2017  . Moderna SARS-COVID-2 Vaccination 05/13/2019, 06/10/2019  . Pneumococcal Polysaccharide-23 12/27/2016  . Zoster 02/03/2013   We discussed:  Pt states she has had Prevnar13 and Shingrix vaccines  Pt thinks she had tetanus shot 2-3 years ago, pt will locate exact date  Plan Discuss Tdap date at follow up  Medication Management   Pt uses [facility] for all medications Uses pill box? Yes Pt endorses 93% compliance  We discussed:  Importance of taking all medications as directed every day  Adherence packaging available with [facility] pharmacy is more expensive than receiving medications via [facility] current medication management strategy   Follow up: 3 month phone visit  Jannette Fogo, PharmD Clinical Pharmacist [facility] [phone]

## 2019-12-02 DIAGNOSIS — E1165 Type 2 diabetes mellitus with hyperglycemia: Secondary | ICD-10-CM | POA: Diagnosis not present

## 2019-12-02 DIAGNOSIS — E119 Type 2 diabetes mellitus without complications: Secondary | ICD-10-CM | POA: Diagnosis not present

## 2019-12-02 DIAGNOSIS — Z794 Long term (current) use of insulin: Secondary | ICD-10-CM | POA: Diagnosis not present

## 2019-12-07 DIAGNOSIS — E7849 Other hyperlipidemia: Secondary | ICD-10-CM | POA: Diagnosis not present

## 2019-12-07 DIAGNOSIS — I2510 Atherosclerotic heart disease of native coronary artery without angina pectoris: Secondary | ICD-10-CM | POA: Diagnosis not present

## 2019-12-07 DIAGNOSIS — R072 Precordial pain: Secondary | ICD-10-CM | POA: Diagnosis not present

## 2019-12-07 DIAGNOSIS — E034 Atrophy of thyroid (acquired): Secondary | ICD-10-CM | POA: Diagnosis not present

## 2019-12-07 DIAGNOSIS — M791 Myalgia, unspecified site: Secondary | ICD-10-CM | POA: Diagnosis not present

## 2019-12-22 ENCOUNTER — Other Ambulatory Visit: Payer: Self-pay

## 2019-12-22 ENCOUNTER — Ambulatory Visit: Payer: Self-pay

## 2019-12-22 ENCOUNTER — Telehealth: Payer: Medicare PPO

## 2019-12-22 DIAGNOSIS — E039 Hypothyroidism, unspecified: Secondary | ICD-10-CM

## 2019-12-22 DIAGNOSIS — N182 Chronic kidney disease, stage 2 (mild): Secondary | ICD-10-CM

## 2019-12-22 DIAGNOSIS — I10 Essential (primary) hypertension: Secondary | ICD-10-CM

## 2019-12-22 DIAGNOSIS — E7800 Pure hypercholesterolemia, unspecified: Secondary | ICD-10-CM

## 2019-12-22 DIAGNOSIS — E1122 Type 2 diabetes mellitus with diabetic chronic kidney disease: Secondary | ICD-10-CM

## 2019-12-22 DIAGNOSIS — I1310 Hypertensive heart and chronic kidney disease without heart failure, with stage 1 through stage 4 chronic kidney disease, or unspecified chronic kidney disease: Secondary | ICD-10-CM

## 2019-12-22 NOTE — Patient Instructions (Addendum)
Visit Information  Goals Addressed            This Visit's Progress   . Pharmacy Care Plan       .CARE PLAN ENTRY (see longitudinal plan of care for additional care plan information)  Current Barriers:  . Chronic Disease Management support, education, and care coordination needs related to Hypertension, Hyperlipidemia, and Diabetes   Hypertension BP Readings from Last 3 Encounters:  09/30/19 118/74  09/23/19 120/72  08/31/19 120/60   . Pharmacist Clinical Goal(s): o Over the next 180 days, patient will work with PharmD and providers to maintain BP goal <130/80 . Current regimen:   Amlodipine 5 mg at bedtime   Furosemide 20 mg daily  Hydralazine 10mg  every 8 hours  Olmesartan 40 mg daily . Interventions: o Counseled patient to take hydralazine doses 8 hours apart o Provided dietary and exercise recommendations . Patient self care activities - Over the next 180 days, patient will: o Check BP periodically and if symptomatic, document, and provide at future appointments o Ensure daily salt intake < 2300 mg/day o Exercise at least 30 minutes daily, 5 days per week  Hyperlipidemia Lab Results  Component Value Date/Time   LDLCALC 134 (H) 07/14/2019 04:33 AM   LDLCALC 116 (H) 06/02/2019 09:18 AM   . Pharmacist Clinical Goal(s): o Over the next 90 days, patient will work with PharmD and providers to achieve LDL goal < 70 . Current regimen:  o Repatha 140mg  every 14 days . Interventions: o Provided dietary and exercise recommendations o Determined patient has not started Repatha due to waiting on 08/02/2019 information in the mail o Located patient's information in [facility] system and provided billing information to [employer]. Confirmed $0 copay for Repatha o Notified patient that Repatha is available for pickup . Patient self care activities - Over the next 90 days, patient will: o Exercise at least 30 minutes daily, 5 days per week o Continue to follow  cholesterol lowering diet as provided by [facility]  Diabetes Lab Results  Component Value Date/Time   HGBA1C 5.7 (H) 09/30/2019 10:48 AM   HGBA1C 5.6 06/02/2019 09:18 AM   . Pharmacist Clinical Goal(s): o Over the next 180 days, patient will work with PharmD and providers to maintain A1c goal <7% . Current regimen:  o Ozempic 0.25 mg SQ Thursdays . Interventions: o Provided dietary and exercise recommendations o Confirmed patient received Ozempic patient assistance shipment in July . Patient self care activities - Over the next 180 days, patient will: o Check blood sugar once daily, document, and provide at future appointments o Contact provider with any episodes of hypoglycemia o Exercise at least 30 minutes daily, 5 days per week  Medication management . Pharmacist Clinical Goal(s): o Over the next 90 days, patient will work with PharmD and providers to achieve optimal medication adherence . Current pharmacy: 08-26-1979 . Interventions o Comprehensive medication review performed. o Continue current medication management strategy o Determined [facility] does NOT offer pill packaging at this time o Discussed with patient that [facility] does offer medication synchronization, adherence packaging and delivery, but it is more expensive than [facility] medications . Patient self care activities - Over the next 90 days, patient will: o Focus on medication adherence by continued use of pill box o Take medications as prescribed o Report any questions or concerns to PharmD and/or provider(s)  Please see past updates related to this goal by clicking on the "Past Updates" button in the selected goal  The patient verbalized understanding of instructions provided today and agreed to receive a mailed copy of patient instruction and/or educational materials.  Telephone follow up appointment with pharmacy team member scheduled for:02/26/20 @ 12:30 PM  Beryle Flock,  PharmD Clinical Pharmacist [facility] [phone]   Fat and Cholesterol Restricted Eating Plan Getting too much fat and cholesterol in your diet may cause health problems. Choosing the right foods helps keep your fat and cholesterol at normal levels. This can keep you from getting certain diseases. Your doctor may recommend an eating plan that includes:  Total fat: ______% or less of total calories a day.  Saturated fat: ______% or less of total calories a day.  Cholesterol: less than _________mg a day.  Fiber: ______g a day. What are tips for following this plan? Meal planning  At meals, divide your plate into four equal parts: ? Fill one-half of your plate with vegetables and green salads. ? Fill one-fourth of your plate with whole grains. ? Fill one-fourth of your plate with low-fat (lean) protein foods.  Eat fish that is high in omega-3 fats at least two times a week. This includes mackerel, tuna, sardines, and salmon.  Eat foods that are high in fiber, such as whole grains, beans, apples, broccoli, carrots, peas, and barley. General tips   Work with your doctor to lose weight if you need to.  Avoid: ? Foods with added sugar. ? Fried foods. ? Foods with partially hydrogenated oils.  Limit alcohol intake to no more than 1 drink a day for nonpregnant women and 2 drinks a day for men. One drink equals 12 oz of beer, 5 oz of wine, or 1 oz of hard liquor. Reading food labels  Check food labels for: ? Trans fats. ? Partially hydrogenated oils. ? Saturated fat (g) in each serving. ? Cholesterol (mg) in each serving. ? Fiber (g) in each serving.  Choose foods with healthy fats, such as: ? Monounsaturated fats. ? Polyunsaturated fats. ? Omega-3 fats.  Choose grain products that have whole grains. Look for the word "whole" as the first word in the ingredient list. Cooking  Cook foods using low-fat methods. These include baking, boiling,  grilling, and broiling.  Eat more home-cooked foods. Eat at restaurants and buffets less often.  Avoid cooking using saturated fats, such as butter, cream, palm oil, palm kernel oil, and coconut oil. Recommended foods  Fruits  All fresh, canned (in natural juice), or frozen fruits. Vegetables  Fresh or frozen vegetables (raw, steamed, roasted, or grilled). Green salads. Grains  Whole grains, such as whole wheat or whole grain breads, crackers, cereals, and pasta. Unsweetened oatmeal, bulgur, barley, quinoa, or brown rice. Corn or whole wheat flour tortillas. Meats and other protein foods  Ground beef (85% or leaner), grass-fed beef, or beef trimmed of fat. Skinless chicken or [place]. Ground chicken or [place]. Pork trimmed of fat. All fish and seafood. Egg whites. Dried beans, peas, or lentils. Unsalted nuts or seeds. Unsalted canned beans. Nut butters without added sugar or oil. Dairy  Low-fat or nonfat dairy products, such as skim or 1% milk, 2% or reduced-fat cheeses, low-fat and fat-free ricotta or cottage cheese, or plain low-fat and nonfat yogurt. Fats and oils  Tub margarine without trans fats. Light or reduced-fat mayonnaise and salad dressings. Avocado. Olive, canola, sesame, or safflower oils. The items listed above may not be a complete list of foods and beverages you can eat. Contact a dietitian for more information. Foods to  avoid Fruits  Canned fruit in heavy syrup. Fruit in cream or butter sauce. Fried fruit. Vegetables  Vegetables cooked in cheese, cream, or butter sauce. Fried vegetables. Grains  White bread. White pasta. White rice. Cornbread. Bagels, pastries, and croissants. Crackers and snack foods that contain trans fat and hydrogenated oils. Meats and other protein foods  Fatty cuts of meat. Ribs, chicken wings, bacon, sausage, bologna, salami, chitterlings, fatback, hot dogs, bratwurst, and packaged lunch meats. Liver and organ meats. Whole eggs and egg  yolks. Chicken and [place] with skin. Fried meat. Dairy  Whole or 2% milk, cream, half-and-half, and cream cheese. Whole milk cheeses. Whole-fat or sweetened yogurt. Full-fat cheeses. Nondairy creamers and whipped toppings. Processed cheese, cheese spreads, and cheese curds. Beverages  Alcohol. Sugar-sweetened drinks such as sodas, lemonade, and fruit drinks. Fats and oils  Butter, stick margarine, lard, shortening, ghee, or bacon fat. Coconut, palm kernel, and palm oils. Sweets and desserts  Corn syrup, sugars, honey, and molasses. Candy. Jam and jelly. Syrup. Sweetened cereals. Cookies, pies, cakes, donuts, muffins, and ice cream. The items listed above may not be a complete list of foods and beverages you should avoid. Contact a dietitian for more information. Summary  Choosing the right foods helps keep your fat and cholesterol at normal levels. This can keep you from getting certain diseases.  At meals, fill one-half of your plate with vegetables and green salads.  Eat high-fiber foods, like whole grains, beans, apples, carrots, peas, and barley.  Limit added sugar, saturated fats, alcohol, and fried foods. This information is not intended to replace advice given to you by your health care provider. Make sure you discuss any questions you have with your health care provider. Document Revised: 11/13/2017 Document Reviewed: 11/27/2016 Elsevier Patient Education  2020 [employer].

## 2019-12-23 NOTE — Patient Instructions (Signed)
Visit Information  Goals Addressed      Patient Stated   .  "I would like to keep my Diabetes under good conrol" (pt-stated)   On track     CARE PLAN ENTRY (see longtitudinal plan of care for additional care plan information)  Current Barriers:  [name] Knowledge Deficits related to disease process and Self Health management of DM . Chronic Disease Management support and education needs related to DM II with stage III CKD, Hypothyroidism, Hypercholesterolemia, HTN  Nurse Case Manager Clinical Goal(s):  [name] New 12/22/19 Over the next 180 days, patient will work with the CCM team and PCP to address needs related to disease education and support to improve Self Health management of DM.   CCM RN CM Interventions:  12/22/19 call completed with patient  . Evaluation of current treatment plan related to DM and patient's adherence to plan as established by provider. . Provided education to patient re: current A1c 5.7%; Re-educated on target A1c; Re-educated on dietary and exercise recommendations; Re-educated on daily glycemic control, FBS 80-130, <180 after meals  ' . Positive Reinforcement provided to patient for making efforts to lower her A1c and improve her overall health  . Reviewed medications with patient and discussed indication, dosage and frequency of prescribed medications; patient working with Pharm D for financial assistance of meds, denies having noted SE  . Provided patient with printed educational materials related to Diabetes Care Schedule; Preventing Complications from Diabetes; Grocery Shopping with Diabetes . Advised patient, providing education and rationale, to check cbg 1-2 daily before meals and record, calling CCM RN and or PCP for findings outside established parameters . Discussed plans with patient for ongoing care management follow up and provided patient with direct contact information for care management team   Patient Self Care Activities:  . Self administers medications  as prescribed . Attends all scheduled provider appointments . Calls pharmacy for medication refills . Performs ADL's independently . Performs IADL's independently . Calls provider office for new concerns or questions  Please see past updates related to this goal by clicking on the "Past Updates" button in the selected goal      .  "I would like to keep my kidney's healthy" (pt-stated)   On track     CARE PLAN ENTRY (see longtitudinal plan of care for additional care plan information)  Current Barriers:  [name] Knowledge Deficits related to disease process and Self Health management of CKD . Chronic Disease Management support and education needs related to DMII with stage 3 CKD, Hypothyroidism, Hypercholesterolemia, HTN  Nurse Case Manager Clinical Goal(s):  . 12/22/19 New Over the next 180 days, patient will work with CCM RN and PCP to address needs related to disease education and support for improved Self Management of CKD  CCM RN CM Interventions:  12/22/19 call completed with patient . Evaluation of current treatment plan related to CKD and patient's adherence to plan as established by provider. [name] Re-educated patient re: stages of CKD providing rationale and discussed patient's current GFR of <55; Educated on ways to improve kidney function and Self Health management of this condition  . Provided patient with printed educational materials related to [employer] with Diabetes; Salt Substitutes for Kidney Disease . Discussed plans with patient for ongoing care management follow up and provided patient with direct contact information for care management team  Patient Self Care Activities:  . Self administers medications as prescribed . Attends all scheduled provider appointments . Calls pharmacy for medication refills .  Performs ADL's independently . Performs IADL's independently . Calls provider office for new concerns or questions  Please see past updates related to this goal  by clicking on the "Past Updates" button in the selected goal        Patient verbalizes understanding of instructions provided today.   Telephone follow up appointment with care management team member scheduled for: 02/04/20  Delsa Sale, RN, BSN, CCM Care Management Coordinator [facility] Care Management/[facility]  Direct Phone: [phone]

## 2019-12-23 NOTE — Chronic Care Management (AMB) (Signed)
Chronic Care Management   Follow Up Note   12/22/2019 Name: [name] MRN: [id number] DOB: [birth year]  Referred by: Catherine Peng, MD Reason for referral : Chronic Care Management (FU RN CM Call)   [name] is a 74 y.o. year old female who is a primary care patient of Catherine Peng, MD. The CCM team was consulted for assistance with chronic disease management and care coordination needs.    Review of patient status, including review of consultants reports, relevant laboratory and other test results, and collaboration with appropriate care team members and the patient's provider was performed as part of comprehensive patient evaluation and provision of chronic care management services.    SDOH (Social Determinants of Health) assessments performed: Yes - no acute challenges identified at this time See Care Plan activities for detailed interventions related to SDOH)   Placed outbound CCM RN CM follow up call to patient for a care plan update.     Outpatient Encounter Medications as of 12/22/2019  Medication Sig Note  . acidophilus (RISAQUAD) CAPS capsule Take 1 capsule by mouth daily. [facility] 24/7 Probiotic   . albuterol (PROVENTIL HFA;VENTOLIN HFA) 108 (90 Base) MCG/ACT inhaler Inhale 2 puffs into the lungs every 6 (six) hours as needed for wheezing or shortness of breath. (Patient not taking: Reported on 12/01/2019)   . amLODipine (NORVASC) 5 MG tablet Take 5 mg by mouth at bedtime.    [name] aspirin EC 81 MG tablet Take 81 mg by mouth daily.   [name] b complex vitamins tablet Take 1 tablet by mouth daily with lunch.    . bisacodyl (DULCOLAX) 5 MG EC tablet Take 5 mg by mouth daily as needed for moderate constipation. (Patient not taking: Reported on 12/01/2019)   . Calcium Carb-Cholecalciferol (CALCIUM + D3 PO) Take 1 tablet by mouth at bedtime.   . cholecalciferol (VITAMIN D) 25 MCG (1000 UNIT) tablet Take 1 tablet by mouth daily.    . cilostazol (PLETAL) 100 MG tablet Take  1 tablet (100 mg total) by mouth daily.   . Evolocumab (REPATHA SURECLICK) 140 MG/ML SOAJ Inject 140 mg into the skin every 14 (fourteen) days. (Patient not taking: Reported on 12/01/2019)   . furosemide (LASIX) 20 MG tablet Take 1 tablet (20 mg total) by mouth daily.   . hydrALAZINE (APRESOLINE) 10 MG tablet Take 1 tablet (10 mg total) by mouth every 8 (eight) hours. (Patient taking differently: Take 10 mg by mouth 3 (three) times daily with meals. )   . isosorbide mononitrate (IMDUR) 60 MG 24 hr tablet Take 1 tablet (60 mg total) by mouth at bedtime.   [name] levothyroxine (SYNTHROID) 125 MCG tablet TAKE 1 TABLET DAILY ON MONDAY THRU FRIDAY   . Magnesium 250 MG TABS Take 1 tablet (250 mg total) by mouth at bedtime.   . Multiple Vitamins-Minerals (EMERGEN-C IMMUNE PO) Take 1 tablet by mouth daily.   . Multiple Vitamins-Minerals (MULTIVITAMIN WITH MINERALS) tablet Take 1 tablet by mouth daily with lunch.    . nitroGLYCERIN (NITROSTAT) 0.4 MG SL tablet Place 1 tablet (0.4 mg total) under the tongue every 5 (five) minutes x 3 doses as needed for chest pain. (Patient not taking: Reported on 10/29/2019) 07/13/2019: 1 tablet today  . olmesartan (BENICAR) 40 MG tablet Take 1 tablet (40 mg total) by mouth daily.   . potassium chloride (K-DUR) 10 MEQ tablet Take 10 mEq by mouth 2 (two) times daily with breakfast and lunch.    . psyllium (METAMUCIL) 58.6 %  packet Take 1 packet by mouth daily. (Patient not taking: Reported on 12/01/2019)   . Semaglutide (OZEMPIC, 0.25 OR 0.5 MG/DOSE, [place]) Inject 0.25 mg into the skin every Thursday.    No facility-administered encounter medications on file as of 12/22/2019.     Objective: Lab Results  Component Value Date   HGBA1C 5.7 (H) 09/30/2019   HGBA1C 5.6 06/02/2019   HGBA1C 6.3 (H) 02/12/2019   Lab Results  Component Value Date   MICROALBUR 10 05/27/2019   LDLCALC 134 (H) 07/14/2019   CREATININE 1.14 (H) 09/30/2019   BP Readings from Last 3 Encounters:  09/30/19  118/74  09/23/19 120/72  08/31/19 120/60     Goals Addressed      Patient Stated   .  "I would like to keep my Diabetes under good conrol" (pt-stated)   On track     CARE PLAN ENTRY (see longtitudinal plan of care for additional care plan information)  Current Barriers:  [name] Knowledge Deficits related to disease process and Self Health management of DM . Chronic Disease Management support and education needs related to DM II with stage III CKD, Hypothyroidism, Hypercholesterolemia, HTN  Nurse Case Manager Clinical Goal(s):  [name] New 12/22/19 Over the next 180 days, patient will work with the CCM team and PCP to address needs related to disease education and support to improve Self Health management of DM.   CCM RN CM Interventions:  12/22/19 call completed with patient  . Evaluation of current treatment plan related to DM and patient's adherence to plan as established by provider. . Provided education to patient re: current A1c 5.7%; Re-educated on target A1c; Re-educated on dietary and exercise recommendations; Re-educated on daily glycemic control, FBS 80-130, <180 after meals  ' . Positive Reinforcement provided to patient for making efforts to lower her A1c and improve her overall health  . Reviewed medications with patient and discussed indication, dosage and frequency of prescribed medications; patient working with Pharm D for financial assistance of meds, denies having noted SE  . Provided patient with printed educational materials related to Diabetes Care Schedule; Preventing Complications from Diabetes; Grocery Shopping with Diabetes . Advised patient, providing education and rationale, to check cbg 1-2 daily before meals and record, calling CCM RN and or PCP for findings outside established parameters . Discussed plans with patient for ongoing care management follow up and provided patient with direct contact information for care management team   Patient Self Care Activities:   . Self administers medications as prescribed . Attends all scheduled provider appointments . Calls pharmacy for medication refills . Performs ADL's independently . Performs IADL's independently . Calls provider office for new concerns or questions  Please see past updates related to this goal by clicking on the "Past Updates" button in the selected goal      .  "I would like to keep my kidney's healthy" (pt-stated)   On track     CARE PLAN ENTRY (see longtitudinal plan of care for additional care plan information)  Current Barriers:  [name] Knowledge Deficits related to disease process and Self Health management of CKD . Chronic Disease Management support and education needs related to DMII with stage 3 CKD, Hypothyroidism, Hypercholesterolemia, HTN  Nurse Case Manager Clinical Goal(s):  . 12/22/19 New Over the next 180 days, patient will work with CCM RN and PCP to address needs related to disease education and support for improved Self Management of CKD  CCM RN CM Interventions:  12/22/19 call completed  with patient . Evaluation of current treatment plan related to CKD and patient's adherence to plan as established by provider. [name] Re-educated patient re: stages of CKD providing rationale and discussed patient's current GFR of <55; Educated on ways to improve kidney function and Self Health management of this condition  . Provided patient with printed educational materials related to [employer] with Diabetes; Salt Substitutes for Kidney Disease . Discussed plans with patient for ongoing care management follow up and provided patient with direct contact information for care management team  Patient Self Care Activities:  . Self administers medications as prescribed . Attends all scheduled provider appointments . Calls pharmacy for medication refills . Performs ADL's independently . Performs IADL's independently . Calls provider office for new concerns or questions  Please see  past updates related to this goal by clicking on the "Past Updates" button in the selected goal        Plan:   Telephone follow up appointment with care management team member scheduled for: 02/04/20  Delsa Sale, RN, BSN, CCM Care Management Coordinator [facility] Care Management/[facility]  Direct Phone: [phone]

## 2020-01-04 ENCOUNTER — Ambulatory Visit: Payer: Medicare PPO

## 2020-01-04 ENCOUNTER — Other Ambulatory Visit: Payer: Self-pay

## 2020-01-04 ENCOUNTER — Telehealth: Payer: Self-pay | Admitting: Pharmacist

## 2020-01-04 VITALS — BP 110/72 | Temp 98.5°F | Ht 62.0 in | Wt 149.0 lb

## 2020-01-04 DIAGNOSIS — E7800 Pure hypercholesterolemia, unspecified: Secondary | ICD-10-CM

## 2020-01-04 DIAGNOSIS — Z23 Encounter for immunization: Secondary | ICD-10-CM | POA: Diagnosis not present

## 2020-01-04 NOTE — Progress Notes (Signed)
Pt is here for repatha injection training. Pt self administered today.

## 2020-01-04 NOTE — Chronic Care Management (AMB) (Signed)
Chronic Care Management Pharmacy Assistant   Name: [name]  MRN: [id number] DOB: Jul 26, 1945  Reason for Encounter: Medication Review- Patient Assistance Coordination.   PCP : [name], MD  Allergies:   Allergies  Allergen Reactions  . Atorvastatin Swelling and Other (See Comments)    Unknown reaction  . Clopidogrel Other (See Comments)    Caused bruising  . Metoprolol Other (See Comments)    Slows heart rate to low  . Metoprolol Tartrate Swelling  . Pregabalin Swelling  . Rosuvastatin Calcium Other (See Comments)    Unknown reaction    Medications: Outpatient Encounter Medications as of 01/04/2020  Medication Sig Note  . acidophilus (RISAQUAD) CAPS capsule Take 1 capsule by mouth daily. [facility] 24/7 Probiotic   . albuterol (PROVENTIL HFA;VENTOLIN HFA) 108 (90 Base) MCG/ACT inhaler Inhale 2 puffs into the lungs every 6 (six) hours as needed for wheezing or shortness of breath. (Patient not taking: Reported on 12/01/2019)   . amLODipine (NORVASC) 5 MG tablet Take 5 mg by mouth at bedtime.    [name] aspirin EC 81 MG tablet Take 81 mg by mouth daily.   [name] b complex vitamins tablet Take 1 tablet by mouth daily with lunch.    . bisacodyl (DULCOLAX) 5 MG EC tablet Take 5 mg by mouth daily as needed for moderate constipation. (Patient not taking: Reported on 12/01/2019)   . Calcium Carb-Cholecalciferol (CALCIUM + D3 PO) Take 1 tablet by mouth at bedtime.   . cholecalciferol (VITAMIN D) 25 MCG (1000 UNIT) tablet Take 1 tablet by mouth daily.    . cilostazol (PLETAL) 100 MG tablet Take 1 tablet (100 mg total) by mouth daily.   . Evolocumab (REPATHA SURECLICK) 140 MG/ML SOAJ Inject 140 mg into the skin every 14 (fourteen) days. (Patient not taking: Reported on 12/01/2019)   . furosemide (LASIX) 20 MG tablet Take 1 tablet (20 mg total) by mouth daily.   . hydrALAZINE (APRESOLINE) 10 MG tablet Take 1 tablet (10 mg total) by mouth every 8 (eight) hours. (Patient taking  differently: Take 10 mg by mouth 3 (three) times daily with meals. )   . isosorbide mononitrate (IMDUR) 60 MG 24 hr tablet Take 1 tablet (60 mg total) by mouth at bedtime.   [name] levothyroxine (SYNTHROID) 125 MCG tablet TAKE 1 TABLET DAILY ON MONDAY THRU FRIDAY   . Magnesium 250 MG TABS Take 1 tablet (250 mg total) by mouth at bedtime.   . Multiple Vitamins-Minerals (EMERGEN-C IMMUNE PO) Take 1 tablet by mouth daily.   . Multiple Vitamins-Minerals (MULTIVITAMIN WITH MINERALS) tablet Take 1 tablet by mouth daily with lunch.    . nitroGLYCERIN (NITROSTAT) 0.4 MG SL tablet Place 1 tablet (0.4 mg total) under the tongue every 5 (five) minutes x 3 doses as needed for chest pain. (Patient not taking: Reported on 10/29/2019) 07/13/2019: 1 tablet today  . olmesartan (BENICAR) 40 MG tablet Take 1 tablet (40 mg total) by mouth daily.   . potassium chloride (K-DUR) 10 MEQ tablet Take 10 mEq by mouth 2 (two) times daily with breakfast and lunch.    . psyllium (METAMUCIL) 58.6 % packet Take 1 packet by mouth daily. (Patient not taking: Reported on 12/01/2019)   . Semaglutide (OZEMPIC, 0.25 OR 0.5 MG/DOSE, [place]) Inject 0.25 mg into the skin every Thursday.    No facility-administered encounter medications on file as of 01/04/2020.    Current Diagnosis: Patient Active Problem List   Diagnosis Date Noted  . Hyperlipidemia 10/30/2019  .  Statin myopathy 10/30/2019  . Acute bronchitis 06/06/2018  . Type 2 diabetes mellitus with stage 2 chronic kidney disease, without long-term current use of insulin (HCC) 02/17/2018  . Chronic renal disease, stage II 02/17/2018  . Benign hypertensive heart and renal disease 02/17/2018  . Primary hypothyroidism 02/17/2018  . Localized osteoarthritis of right knee 02/17/2018  . Essential hypertension 06/12/2017  . Acute coronary syndrome (HCC) 12/26/2016  . Hypertensive heart disease without heart failure 10/06/2015  . Insomnia with sleep apnea 10/06/2015  . OSA on CPAP 10/06/2015    . 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 CAD in native artery 10/06/2015  . Chest pain at rest 07/18/2015    Class: Acute  . Weakness 07/18/2015    Class: Acute  . Insomnia 05/05/2015  . Primary snoring 05/05/2015     Follow-Up:  Patient Assistance Coordination - Reorder Form filled out to [employer] for Ozempic 0.25 mg/ dose. Awaiting for provider signature.  01/14/2020 called [facility] spoke with Babette Relic states they received order for renewal for November and will process 01/16/2020. Medications will be at PCP office 14 days form 01/16/2020.  Christian Davis,CPP. Notified   [name], [facility] Clinical Pharmacist Assistant [phone]

## 2020-01-07 ENCOUNTER — Telehealth: Payer: Self-pay

## 2020-01-07 NOTE — Telephone Encounter (Signed)
I left the pt a message to call the office back.  Dr. Allyne Gee got a prescription request for a blood pressure monitor from [facility] and wants to know if the pt can measure her arm.

## 2020-01-11 ENCOUNTER — Telehealth: Payer: Self-pay

## 2020-01-11 DIAGNOSIS — E034 Atrophy of thyroid (acquired): Secondary | ICD-10-CM | POA: Diagnosis not present

## 2020-01-11 DIAGNOSIS — E7849 Other hyperlipidemia: Secondary | ICD-10-CM | POA: Diagnosis not present

## 2020-01-11 DIAGNOSIS — R072 Precordial pain: Secondary | ICD-10-CM | POA: Diagnosis not present

## 2020-01-11 DIAGNOSIS — I2510 Atherosclerotic heart disease of native coronary artery without angina pectoris: Secondary | ICD-10-CM | POA: Diagnosis not present

## 2020-01-11 DIAGNOSIS — M791 Myalgia, unspecified site: Secondary | ICD-10-CM | POA: Diagnosis not present

## 2020-01-11 NOTE — Telephone Encounter (Signed)
The pt was told that Dr. Allyne Gee wants to know if the pt can measure her arm for a bp cuff order that [facility] sent to the office, the pt said she is having an appt at Dr. Elie Confer and will get it measured at that appt and call back with the information.

## 2020-01-19 ENCOUNTER — Other Ambulatory Visit: Payer: Self-pay | Admitting: Internal Medicine

## 2020-01-19 DIAGNOSIS — Z0000 Encounter for general adult medical examination without abnormal findings: Secondary | ICD-10-CM

## 2020-01-22 ENCOUNTER — Telehealth: Payer: Self-pay

## 2020-01-22 NOTE — Telephone Encounter (Signed)
The pt was notified that her Ozempic has been delivered from the pt assistance program and the office will reopen on MonDAY.

## 2020-01-25 ENCOUNTER — Telehealth: Payer: Self-pay | Admitting: Pharmacist

## 2020-01-25 NOTE — Chronic Care Management (AMB) (Signed)
Chronic Care Management Pharmacy Assistant   Name: Catherine PlumberMarie Butler [name]  MRN: [id number] DOB: [birth year]  Reason for Encounter: Disease State - Diabetes, Hypertension and Lipid Adherence Call.  Patient Questions:  1.  Have you seen any other providers since your last visit? Yes. 01/04/2020-  [facility] - Flu Shot   2.  Any changes in your medicines or health? No     PCP : Dorothyann PengSanders, Robyn, MD  Allergies:   Allergies  Allergen Reactions  . Atorvastatin Swelling and Other (See Comments)    Unknown reaction  . Clopidogrel Other (See Comments)    Caused bruising  . Metoprolol Other (See Comments)    Slows heart rate to low  . Metoprolol Tartrate Swelling  . Pregabalin Swelling  . Rosuvastatin Calcium Other (See Comments)    Unknown reaction    Medications: Outpatient Encounter Medications as of 01/25/2020  Medication Sig Note  . acidophilus (RISAQUAD) CAPS capsule Take 1 capsule by mouth daily. [facility] 24/7 Probiotic   . albuterol (PROVENTIL HFA;VENTOLIN HFA) 108 (90 Base) MCG/ACT inhaler Inhale 2 puffs into the lungs every 6 (six) hours as needed for wheezing or shortness of breath. (Patient not taking: Reported on 12/01/2019)   . amLODipine (NORVASC) 5 MG tablet Take 5 mg by mouth at bedtime.    [name]. aspirin EC 81 MG tablet Take 81 mg by mouth daily.   [name]. b complex vitamins tablet Take 1 tablet by mouth daily with lunch.    . bisacodyl (DULCOLAX) 5 MG EC tablet Take 5 mg by mouth daily as needed for moderate constipation. (Patient not taking: Reported on 12/01/2019)   . Calcium Carb-Cholecalciferol (CALCIUM + D3 PO) Take 1 tablet by mouth at bedtime.   . cholecalciferol (VITAMIN D) 25 MCG (1000 UNIT) tablet Take 1 tablet by mouth daily.    . cilostazol (PLETAL) 100 MG tablet Take 1 tablet (100 mg total) by mouth daily.   . Evolocumab (REPATHA SURECLICK) 140 MG/ML SOAJ Inject 140 mg into the skin every 14 (fourteen) days. (Patient not taking: Reported on 12/01/2019)   .  furosemide (LASIX) 20 MG tablet Take 1 tablet (20 mg total) by mouth daily.   . hydrALAZINE (APRESOLINE) 10 MG tablet Take 1 tablet (10 mg total) by mouth every 8 (eight) hours. (Patient taking differently: Take 10 mg by mouth 3 (three) times daily with meals. )   . isosorbide mononitrate (IMDUR) 60 MG 24 hr tablet Take 1 tablet (60 mg total) by mouth at bedtime.   [name]. levothyroxine (SYNTHROID) 125 MCG tablet TAKE 1 TABLET DAILY ON MONDAY THRU FRIDAY   . Magnesium 250 MG TABS Take 1 tablet (250 mg total) by mouth at bedtime.   . Multiple Vitamins-Minerals (EMERGEN-C IMMUNE PO) Take 1 tablet by mouth daily.   . Multiple Vitamins-Minerals (MULTIVITAMIN WITH MINERALS) tablet Take 1 tablet by mouth daily with lunch.    . nitroGLYCERIN (NITROSTAT) 0.4 MG SL tablet Place 1 tablet (0.4 mg total) under the tongue every 5 (five) minutes x 3 doses as needed for chest pain. (Patient not taking: Reported on 10/29/2019) 07/13/2019: 1 tablet today  . olmesartan (BENICAR) 40 MG tablet Take 1 tablet (40 mg total) by mouth daily.   . potassium chloride (K-DUR) 10 MEQ tablet Take 10 mEq by mouth 2 (two) times daily with breakfast and lunch.    . psyllium (METAMUCIL) 58.6 % packet Take 1 packet by mouth daily. (Patient not taking: Reported on 12/01/2019)   . Semaglutide (OZEMPIC,  0.25 OR 0.5 MG/DOSE, [place]) Inject 0.25 mg into the skin every Thursday.    No facility-administered encounter medications on file as of 01/25/2020.    Current Diagnosis: Patient Active Problem List   Diagnosis Date Noted  . Hyperlipidemia 10/30/2019  . Statin myopathy 10/30/2019  . Acute bronchitis 06/06/2018  . Type 2 diabetes mellitus with stage 2 chronic kidney disease, without long-term current use of insulin (HCC) 02/17/2018  . Chronic renal disease, stage II 02/17/2018  . Benign hypertensive heart and renal disease 02/17/2018  . Primary hypothyroidism 02/17/2018  . Localized osteoarthritis of right knee 02/17/2018  . Essential  hypertension 06/12/2017  . Acute coronary syndrome (HCC) 12/26/2016  . Hypertensive heart disease without heart failure 10/06/2015  . Insomnia with sleep apnea 10/06/2015  . OSA on CPAP 10/06/2015  . 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 CAD in native artery 10/06/2015  . Chest pain at rest 07/18/2015    Class: Acute  . Weakness 07/18/2015    Class: Acute  . Insomnia 05/05/2015  . Primary snoring 05/05/2015   Recent Relevant Labs: Lab Results  Component Value Date/Time   HGBA1C 5.7 (H) 09/30/2019 10:48 AM   HGBA1C 5.6 06/02/2019 09:18 AM   MICROALBUR 10 05/27/2019 11:30 AM   MICROALBUR 30 05/21/2018 10:56 AM    Kidney Function Lab Results  Component Value Date/Time   CREATININE 1.14 (H) 09/30/2019 10:48 AM   CREATININE 1.03 (H) 07/14/2019 04:33 AM   GFRNONAA 47 (L) 09/30/2019 10:48 AM   GFRAA 55 (L) 09/30/2019 10:48 AM    . Current antihyperglycemic regimen:  o Ozempic 0.25 mg SQ every Thursday. . What recent interventions/DTPs have been made to improve glycemic control:  o Patient states she takes her medication as directed by provider. Have there been any recent hospitalizations or ED visits since last visit with CPP? No . Patient denies hypoglycemic symptoms, including Pale, Sweaty, Shaky, Hungry,  and Vision changes . Patient denies hyperglycemic symptoms, including blurry vision, excessive thirst, fatigue, polyuria and weakness   . How often are you checking your blood sugar? Patient states she takes her blood sugar once a day.  . What are your blood sugars ranging?  o Fasting: 01/20/2020- 209 - 1/2 piece a cake the night before. o Fasting :01/21/2020 - 102 o Fasting :01/22/2020 - 112 o Fasting 01/23/2020 - 104  o Fasting 01/24/2020 - 102 o Fasting 01/25/2020 - 112 o Before meals: none o After meals: none o Bedtime: None  . During the week, how often does your blood glucose drop below 70? No  . Are you checking your feet daily/regularly? Yes, Patient  denies any open sores, numbness, no tingling or pain. Patient states she has a sore (callus) from shoe on 2nd toe, from great toe,on right foot.  Adherence Review: Is the patient currently on a STATIN medication? No Is the patient currently on ACE/ARB medication? Yes Does the patient have >5 day gap between last estimated fill dates?No   Reviewed chart prior to disease state call. Spoke with patient regarding BP  Recent Office Vitals: BP Readings from Last 3 Encounters:  01/04/20 110/72  09/30/19 118/74  09/23/19 120/72   Pulse Readings from Last 3 Encounters:  09/30/19 60  09/23/19 (!) 56  08/31/19 66    Wt Readings from Last 3 Encounters:  01/04/20 149 lb (67.6 kg)  09/30/19 151 lb 3.2 oz (68.6 kg)  09/23/19 153 lb 6.4 oz (69.6 kg)     Kidney Function Lab Results  Component Value Date/Time   CREATININE 1.14 (H) 09/30/2019 10:48 AM   CREATININE 1.03 (H) 07/14/2019 04:33 AM   GFRNONAA 47 (L) 09/30/2019 10:48 AM   GFRAA 55 (L) 09/30/2019 10:48 AM    BMP Latest Ref Rng & Units 09/30/2019 07/14/2019 07/13/2019  Glucose 65 - 99 mg/dL 98 161(W) 960(A)  BUN 8 - 27 mg/dL Creatinine 0.57 - 1.00 mg/dL 5.40(J) 8.11(B) 1.47(W)  BUN/Creat Ratio 12 - 28 11(L) - -  Sodium 134 - 144 mmol/L 139 141 142  Potassium 3.5 - 5.2 mmol/L 4.4 4.0 4.8  Chloride 96 - 106 mmol/L 103 108 109  CO2 20 - 29 mmol/L Calcium 8.7 - 10.3 mg/dL 9.9 9.7 29.5    . Current antihypertensive regimen:  o Amlodipine 5 mg one tablet at bedtime. o Furosemide 20 mg one tablet a day o Hydralazine 10 mg every eight(8) hours o Olmesartan 40 mg daily o  . How often are you checking your Blood Pressure? Patient denies taking blood pressures at home, states b/p has been good at doctors visit. Patient needs new cuff , will talk to provider about at visit next week.  . Current home BP readings: None  . What recent interventions/DTPs have been made by any provider to improve Blood Pressure control  since last CPP Visit: Patient states she has been taking her medications as directed by provider.  . Any recent hospitalizations or ED visits since last visit with CPP? No  . What diet changes have been made to improve Blood Pressure Control?  o Patient states she has been eating healthy , low sodium diet . Small portions. . What exercise is being done to improve your Blood Pressure Control?  o Patient has been exercising she has been monitoring her steps o 01/24/2020 - 7,219 steps o 01/23/2020 -14,022 steps o 01/22/2020 - 14,129 steps  States that she has been feeling better, since she has been walking.   Adherence Review: Is the patient currently on ACE/ARB medication? Yes,  Does the patient have >5 day gap between last estimated fill dates? No  Comprehensive medication review performed; Spoke to patient regarding cholesterol  Lipid Panel    Component Value Date/Time   CHOL 199 07/14/2019 0433   CHOL 199 06/02/2019 0918   TRIG 33 07/14/2019 0433   HDL 58 07/14/2019 0433   HDL 68 06/02/2019 0918   LDLCALC 134 (H) 07/14/2019 0433   LDLCALC 116 (H) 06/02/2019 0918    10-year ASCVD risk score: The 10-year ASCVD risk score Denman George DC Montez Hageman., et al., 2013) is: 26%   Values used to calculate the score:     Age: 65 years     Sex: Female     Is Non-Hispanic African American: Yes     Diabetic: Yes     Tobacco smoker: No     Systolic Blood Pressure: 110 mmHg     Is BP treated: Yes     HDL Cholesterol: 58 mg/dL     Total Cholesterol: 199 mg/dL  . Current antihyperlipidemic regimen:  Repatha 140 mg every 14 days . Previous antihyperlipidemic medications tried: None       ASCVD risk enhancing conditions:   . What recent interventions/DTPs have been made by any provider to improve Cholesterol control since last CPP Visit: Patient takes her medication as directed by provider.  . Any recent hospitalizations or ED visits since last visit with CPP? No  . What diet changes have been  made  to improve Cholesterol?              Patient states she has been eating healthy , low sodium diet . Small portions/ Patient states she does not eat red meats and fatty foods.  . What exercise is being done to improve Cholesterol?  o Patient has been exercising she has been monitoring her steps o 01/24/2020 - 7,219 steps o 01/23/2020 -14,022 steps o 01/22/2020 - 14,129 steps  States that she has been feeling better, since she has been walking  Adherence Review: Does the patient have >5 day gap between last estimated fill dates?          Goals Addressed            This Visit's Progress   . Pharmacy Care Plan   On track    .CARE PLAN ENTRY (see longitudinal plan of care for additional care plan information)  Current Barriers:  . Chronic Disease Management support, education, and care coordination needs related to Hypertension, Hyperlipidemia, and Diabetes   Hypertension BP Readings from Last 3 Encounters:  09/30/19 118/74  09/23/19 120/72  08/31/19 120/60   . Pharmacist Clinical Goal(s): o Over the next 180 days, patient will work with PharmD and providers to maintain BP goal <130/80 . Current regimen:   Amlodipine 5 mg at bedtime   Furosemide 20 mg daily  Hydralazine 10mg  every 8 hours  Olmesartan 40 mg daily . Interventions: o Counseled patient to take hydralazine doses 8 hours apart o Provided dietary and exercise recommendations . Patient self care activities - Over the next 180 days, patient will: o Check BP periodically and if symptomatic, document, and provide at future appointments o Ensure daily salt intake < 2300 mg/day o Exercise at least 30 minutes daily, 5 days per week  Hyperlipidemia Lab Results  Component Value Date/Time   LDLCALC 134 (H) 07/14/2019 04:33 AM   LDLCALC 116 (H) 06/02/2019 09:18 AM   . Pharmacist Clinical Goal(s): o Over the next 90 days, patient will work with PharmD and providers to achieve LDL goal < 70 . Current regimen:    o Repatha 140mg  every 14 days . Interventions: o Provided dietary and exercise recommendations o Determined patient has not started Repatha due to waiting on 08/02/2019 information in the mail o Located patient's information in [place] system and provided billing information to [employer]. Confirmed $0 copay for Repatha o Notified patient that Repatha is available for pickup . Patient self care activities - Over the next 90 days, patient will: o Exercise at least 30 minutes daily, 5 days per week o Continue to follow cholesterol lowering diet as provided by [facility]  Diabetes Lab Results  Component Value Date/Time   HGBA1C 5.7 (H) 09/30/2019 10:48 AM   HGBA1C 5.6 06/02/2019 09:18 AM   . Pharmacist Clinical Goal(s): o Over the next 180 days, patient will work with PharmD and providers to maintain A1c goal <7% . Current regimen:  o Ozempic 0.25 mg SQ Thursdays . Interventions: o Provided dietary and exercise recommendations o Confirmed patient received Ozempic patient assistance shipment in July . Patient self care activities - Over the next 180 days, patient will: o Check blood sugar once daily, document, and provide at future appointments o Contact provider with any episodes of hypoglycemia o Exercise at least 30 minutes daily, 5 days per week  Medication management . Pharmacist Clinical Goal(s): o Over the next 90 days, patient will work with PharmD and providers to achieve optimal  medication adherence . Current pharmacy: [facility] . Interventions o Comprehensive medication review performed. o Continue current medication management strategy o Determined [facility] does NOT offer pill packaging at this time o Discussed with patient that [facility] does offer medication synchronization, adherence packaging and delivery, but it is more expensive than [facility] medications . Patient self care activities - Over the next 90 days, patient will: o Focus on  medication adherence by continued use of pill box o Take medications as prescribed o Report any questions or concerns to PharmD and/or provider(s)  Please see past updates related to this goal by clicking on the "Past Updates" button in the selected goal         Follow-Up:  Pharmacist Review - Patient states on 01/20/2020 she did have some irritable/ nervous, she did check her blood sugar at that time and was normal . She states that she was up set over a phone call. Patient is wanting to get the booster for  Covid.   Christian Davis,CPP Notified  [name], [facility] Clinical Pharmacist Assistant [phone]

## 2020-01-27 ENCOUNTER — Encounter: Payer: Self-pay | Admitting: Internal Medicine

## 2020-01-29 ENCOUNTER — Telehealth: Payer: Self-pay

## 2020-01-29 NOTE — Telephone Encounter (Signed)
The pt left a message that she was returning a call to the office about an appt that she missed and that she didn't have down any appt in Oct on her calender.  I was calling the pt back to let her know it was the pharmacist calling because it says that pt no showed a face to face appt.

## 2020-01-30 ENCOUNTER — Encounter: Payer: Self-pay | Admitting: Internal Medicine

## 2020-01-31 ENCOUNTER — Encounter (HOSPITAL_COMMUNITY): Payer: Self-pay | Admitting: Emergency Medicine

## 2020-01-31 ENCOUNTER — Other Ambulatory Visit: Payer: Self-pay

## 2020-01-31 ENCOUNTER — Emergency Department (HOSPITAL_COMMUNITY)
Admission: EM | Admit: 2020-01-31 | Discharge: 2020-01-31 | Disposition: A | Discharge status: Home / Self Care | Payer: Medicare PPO | Attending: Emergency Medicine | Admitting: Emergency Medicine

## 2020-01-31 DIAGNOSIS — I129 Hypertensive chronic kidney disease with stage 1 through stage 4 chronic kidney disease, or unspecified chronic kidney disease: Secondary | ICD-10-CM | POA: Diagnosis not present

## 2020-01-31 DIAGNOSIS — E039 Hypothyroidism, unspecified: Secondary | ICD-10-CM | POA: Diagnosis not present

## 2020-01-31 DIAGNOSIS — Z79899 Other long term (current) drug therapy: Secondary | ICD-10-CM | POA: Diagnosis not present

## 2020-01-31 DIAGNOSIS — I2510 Atherosclerotic heart disease of native coronary artery without angina pectoris: Secondary | ICD-10-CM | POA: Diagnosis not present

## 2020-01-31 DIAGNOSIS — E1122 Type 2 diabetes mellitus with diabetic chronic kidney disease: Secondary | ICD-10-CM | POA: Insufficient documentation

## 2020-01-31 DIAGNOSIS — R002 Palpitations: Secondary | ICD-10-CM | POA: Diagnosis not present

## 2020-01-31 DIAGNOSIS — Z7982 Long term (current) use of aspirin: Secondary | ICD-10-CM | POA: Insufficient documentation

## 2020-01-31 DIAGNOSIS — N182 Chronic kidney disease, stage 2 (mild): Secondary | ICD-10-CM | POA: Diagnosis not present

## 2020-01-31 LAB — CBC WITH DIFFERENTIAL/PLATELET
Abs Immature Granulocytes: 0 10*3/uL (ref 0.00–0.07)
Basophils Absolute: 0 10*3/uL (ref 0.0–0.1)
Basophils Relative: 1 %
Eosinophils Absolute: 0 10*3/uL (ref 0.0–0.5)
Eosinophils Relative: 1 %
HCT: 36.3 % (ref 36.0–46.0)
Hemoglobin: 12.1 g/dL (ref 12.0–15.0)
Immature Granulocytes: 0 %
Lymphocytes Relative: 46 %
Lymphs Abs: 1.5 10*3/uL (ref 0.7–4.0)
MCH: 31.7 pg (ref 26.0–34.0)
MCHC: 33.3 g/dL (ref 30.0–36.0)
MCV: 95 fL (ref 80.0–100.0)
Monocytes Absolute: 0.3 10*3/uL (ref 0.1–1.0)
Monocytes Relative: 8 %
Neutro Abs: 1.4 10*3/uL — ABNORMAL LOW (ref 1.7–7.7)
Neutrophils Relative %: 44 %
Platelets: 242 10*3/uL (ref 150–400)
RBC: 3.82 MIL/uL — ABNORMAL LOW (ref 3.87–5.11)
RDW: 12.1 % (ref 11.5–15.5)
WBC: 3.2 10*3/uL — ABNORMAL LOW (ref 4.0–10.5)
nRBC: 0 % (ref 0.0–0.2)

## 2020-01-31 LAB — BASIC METABOLIC PANEL
Anion gap: 10 (ref 5–15)
BUN: 10 mg/dL (ref 8–23)
CO2: 24 mmol/L (ref 22–32)
Calcium: 10.2 mg/dL (ref 8.9–10.3)
Chloride: 105 mmol/L (ref 98–111)
Creatinine, Ser: 1.11 mg/dL — ABNORMAL HIGH (ref 0.44–1.00)
GFR, Estimated: 52 mL/min — ABNORMAL LOW (ref >60)
Glucose, Bld: 116 mg/dL — ABNORMAL HIGH (ref 70–99)
Potassium: 4.4 mmol/L (ref 3.5–5.1)
Sodium: 139 mmol/L (ref 135–145)

## 2020-01-31 LAB — MAGNESIUM: Magnesium: 2.1 mg/dL (ref 1.7–2.4)

## 2020-01-31 NOTE — ED Notes (Addendum)
Pt was discharged at 1330 moved chart OTF , all VS up to date,

## 2020-01-31 NOTE — ED Notes (Signed)
Pt given update regarding Dr. Sharyn Lull covering for Dr. Algie Coffer- pt wants to know why Dr. Algie Coffer is not coming. Yelling- "he doesn't know anything about me! Can someone tell me what is going on?" this nurse explained that I was giving her an update, pt yelled "I want to go home, No one is telling me anything! I can die at home!" Husband remains at bedside, attempting to calm pt.

## 2020-01-31 NOTE — ED Provider Notes (Signed)
[facility]   CSN: [id number] Arrival date & time: 01/31/20  0945     History Chief Complaint  Patient presents with  . FAST HR  . Palpitations    [name] is a 74 y.o. female.  74 year old female with prior medical history as detailed below presents for evaluation of reported palpitations.  Patient reports intermittent palpitations and sensation of fast heart rate at home over the last 2 days.  Patient reports intermittent episodes.  Patient's apple watch records heart rates up into the 1 teens.  Patient is currently asymptomatic.  She reports no sensation of palpitations at this time.  She denies associated chest pain, shortness of breath, nausea, vomiting, lightheadedness, diaphoresis, or other complaint.  She reports prior history of fast heart rate.  She had been on metoprolol previously.  She is currently not taking metoprolol given significant bradycardia associated with same.  Patient is known to Dr. Algie CofferKadakia.  She reports routine appointment with him approximately 2 weeks prior without recent change in medications.   The history is provided by the patient and medical records.  Palpitations Palpitations quality:  Regular Onset quality:  Sudden Duration:  2 days Timing:  Intermittent Progression:  Waxing and waning Chronicity:  New Relieved by:  Nothing Worsened by:  Nothing      Past Medical History:  Diagnosis Date  . Benign hypertensive renal disease   . CKD stage 2 due to type 2 diabetes mellitus (HCC)   . Hypertension   . Hypothyroidism   . Obesity   . OSA on CPAP   . Pure hypercholesterolemia   . PVD (peripheral vascular disease) (HCC)   . Vitamin D deficiency     Patient Active Problem List   Diagnosis Date Noted  . Hyperlipidemia 10/30/2019  . Statin myopathy 10/30/2019  . Acute bronchitis 06/06/2018  . Type 2 diabetes mellitus with stage 2 chronic kidney disease, without long-term  current use of insulin (HCC) 02/17/2018  . Chronic renal disease, stage II 02/17/2018  . Benign hypertensive heart and renal disease 02/17/2018  . Primary hypothyroidism 02/17/2018  . Localized osteoarthritis of right knee 02/17/2018  . Essential hypertension 06/12/2017  . Acute coronary syndrome (HCC) 12/26/2016  . Hypertensive heart disease without heart failure 10/06/2015  . Insomnia with sleep apnea 10/06/2015  . OSA on CPAP 10/06/2015  . 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 CAD in native artery 10/06/2015  . Chest pain at rest 07/18/2015    Class: Acute  . Weakness 07/18/2015    Class: Acute  . Insomnia 05/05/2015  . Primary snoring 05/05/2015    Past Surgical History:  Procedure Laterality Date  . ABDOMINAL HYSTERECTOMY    . BREAST CYST EXCISION Right over 20 yrs  . broken leg and displaced ankle    . DILATION AND CURETTAGE OF UTERUS    . LEFT HEART CATH AND CORONARY ANGIOGRAPHY N/A 06/13/2017   Procedure: LEFT HEART CATH AND CORONARY ANGIOGRAPHY;  Surgeon: Orpah CobbKadakia, Ajay, MD;  Location: [facility] INVASIVE CV LAB;  Service: Cardiovascular;  Laterality: N/A;  . right and left rotator cuff    . THYROIDECTOMY       OB History   No obstetric history on file.     Family History  Problem Relation Age of Onset  . Alzheimer's disease Mother   . Aneurysm Father   . Cancer Brother     Social History   Tobacco Use  . Smoking status: Never Smoker  .  Smokeless tobacco: Never Used  Vaping Use  . Vaping Use: Never used  Substance Use Topics  . Alcohol use: No    Alcohol/week: 0.0 standard drinks    Comment: occasional wine  . Drug use: No    Home Medications Prior to Admission medications   Medication Sig Start Date End Date Taking? Authorizing Provider  acidophilus (RISAQUAD) CAPS capsule Take 1 capsule by mouth daily. [facility] 24/7 Probiotic    [provider]  albuterol (PROVENTIL HFA;VENTOLIN HFA) 108 (90 Base) MCG/ACT inhaler Inhale 2 puffs into the  lungs every 6 (six) hours as needed for wheezing or shortness of breath. Patient not taking: Reported on 12/01/2019 06/06/18   Arnette Felts, FNP  amLODipine (NORVASC) 5 MG tablet Take 5 mg by mouth at bedtime.  07/25/18   [provider]  aspirin EC 81 MG tablet Take 81 mg by mouth daily.    [provider]  b complex vitamins tablet Take 1 tablet by mouth daily with lunch.     [provider]  bisacodyl (DULCOLAX) 5 MG EC tablet Take 5 mg by mouth daily as needed for moderate constipation. Patient not taking: Reported on 12/01/2019    [provider]  Calcium Carb-Cholecalciferol (CALCIUM + D3 PO) Take 1 tablet by mouth at bedtime.    [provider]  cholecalciferol (VITAMIN D) 25 MCG (1000 UNIT) tablet Take 1 tablet by mouth daily.     [provider]  cilostazol (PLETAL) 100 MG tablet Take 1 tablet (100 mg total) by mouth daily. 09/23/19   O'NealRonnald Ramp, MD  Evolocumab (REPATHA SURECLICK) 140 MG/ML SOAJ Inject 140 mg into the skin every 14 (fourteen) days. Patient not taking: Reported on 12/01/2019 11/04/19   Sande Rives, MD  furosemide (LASIX) 20 MG tablet Take 1 tablet (20 mg total) by mouth daily. 04/20/19   [name], MD  hydrALAZINE (APRESOLINE) 10 MG tablet Take 1 tablet (10 mg total) by mouth every 8 (eight) hours. Patient taking differently: Take 10 mg by mouth 3 (three) times daily with meals.  06/13/17   Orpah Cobb, MD  isosorbide mononitrate (IMDUR) 60 MG 24 hr tablet Take 1 tablet (60 mg total) by mouth at bedtime. 07/14/19   Orpah Cobb, MD  levothyroxine (SYNTHROID) 125 MCG tablet TAKE 1 TABLET DAILY ON MONDAY THRU FRIDAY 09/18/19   [name], MD  Magnesium 250 MG TABS Take 1 tablet (250 mg total) by mouth at bedtime. 04/20/19   [name], MD  Multiple Vitamins-Minerals (EMERGEN-C IMMUNE PO) Take 1 tablet by mouth daily.    [provider]  Multiple Vitamins-Minerals (MULTIVITAMIN WITH MINERALS)  tablet Take 1 tablet by mouth daily with lunch.     [provider]  nitroGLYCERIN (NITROSTAT) 0.4 MG SL tablet Place 1 tablet (0.4 mg total) under the tongue every 5 (five) minutes x 3 doses as needed for chest pain. Patient not taking: Reported on 10/29/2019 12/27/16   Orpah Cobb, MD  olmesartan (BENICAR) 40 MG tablet Take 1 tablet (40 mg total) by mouth daily. 07/09/19   [name], MD  potassium chloride (K-DUR) 10 MEQ tablet Take 10 mEq by mouth 2 (two) times daily with breakfast and lunch.  10/30/17   [provider]  psyllium (METAMUCIL) 58.6 % packet Take 1 packet by mouth daily. Patient not taking: Reported on 12/01/2019    [provider]  Semaglutide (OZEMPIC, 0.25 OR 0.5 MG/DOSE, [place]) Inject 0.25 mg into the skin every Thursday.  [provider]    Allergies    Atorvastatin, Clopidogrel, Metoprolol, Metoprolol tartrate, Pregabalin, and Rosuvastatin calcium  Review of Systems   Review of Systems  Cardiovascular: Positive for palpitations.  All other systems reviewed and are negative.   Physical Exam Updated Vital Signs BP (!) 154/88   Pulse 70   Temp 98.6 F (37 C) (Oral)   Resp 18   SpO2 100%   Physical Exam Vitals and nursing note reviewed.  Constitutional:      General: She is not in acute distress.    Appearance: Normal appearance. She is well-developed.  HENT:     Head: Normocephalic and atraumatic.  Eyes:     Conjunctiva/sclera: Conjunctivae normal.     Pupils: Pupils are equal, round, and reactive to light.  Cardiovascular:     Rate and Rhythm: Normal rate and regular rhythm.     Heart sounds: Normal heart sounds.  Pulmonary:     Effort: Pulmonary effort is normal. No respiratory distress.     Breath sounds: Normal breath sounds.  Abdominal:     General: There is no distension.     Palpations: Abdomen is soft.     Tenderness: There is no abdominal tenderness.  Musculoskeletal:        General: No deformity. Normal  range of motion.     Cervical back: Normal range of motion and neck supple.  Skin:    General: Skin is warm and dry.  Neurological:     Mental Status: She is alert and oriented to person, place, and time.     ED Results / Procedures / Treatments   Labs (all labs ordered are listed, but only abnormal results are displayed) Labs Reviewed  BASIC METABOLIC PANEL - Abnormal; Notable for the following components:      Result Value   Glucose, Bld 116 (*)    Creatinine, Ser 1.11 (*)    GFR, Estimated 52 (*)    All other components within normal limits  CBC WITH DIFFERENTIAL/PLATELET - Abnormal; Notable for the following components:   WBC 3.2 (*)    RBC 3.82 (*)    Neutro Abs 1.4 (*)    All other components within normal limits  MAGNESIUM    EKG EKG Interpretation  Date/Time:  Sunday January 31 2020 09:55:37 EST Ventricular Rate:  67 PR Interval:  120 QRS Duration: 110 QT Interval:  390 QTC Calculation: 412 R Axis:   30 Text Interpretation: Normal sinus rhythm Incomplete right bundle branch block Nonspecific ST abnormality Abnormal ECG Confirmed by Kristine Royal [id number]) on 01/31/2020 10:09:22 AM   Radiology No results found.  Procedures Procedures (including critical care time)  Medications Ordered in ED Medications - No data to display  ED Course  I have reviewed the triage vital signs and the nursing notes.  Pertinent labs & imaging results that were available during my care of the patient were reviewed by me and considered in my medical decision making (see chart for details).    MDM Rules/Calculators/A&P                          MDM  Screen complete  [name] was evaluated in Emergency Department on 01/31/2020 for the symptoms described in the history of present illness. She was evaluated in the context of the global COVID-19 pandemic, which necessitated consideration that the patient might be at risk for infection with the SARS-CoV-2 virus that  causes COVID-19. Institutional  protocols and algorithms that pertain to the evaluation of patients at risk for COVID-19 are in a state of rapid change based on information released by regulatory bodies including the CDC and federal and state organizations. These policies and algorithms were followed during the patient's care in the ED.  Patient presented with complaint of palpitations.  Patient's palpitations are without evidence of more concerning symptoms such as syncope, lightheadedness, chest pain.  Work-up today is without significant abnormality.  Dr. Sharyn Lull, who is covering for Dr. Algie Coffer, is aware of case.  He agrees with plan for outpatient follow-up with Dr. Algie Coffer.  Patient understands need for close follow-up.  She plans on contacting Dr. Algie Coffer office tomorrow morning.  Patient is very interested in leaving the ED.  She declines further observation and/or admission at this time.  Final Clinical Impression(s) / ED Diagnoses Final diagnoses:  Palpitations    Rx / DC Orders ED Discharge Orders    None       Wynetta Fines, MD 01/31/20 1259

## 2020-01-31 NOTE — ED Triage Notes (Signed)
Pt reports fast HR and palpitations x 2 days.  Denies pain or SOB.

## 2020-01-31 NOTE — Discharge Instructions (Addendum)
Please contact Dr. Algie Coffer tomorrow for close follow-up as an outpatient.  Please return to the ED for any problem.

## 2020-01-31 NOTE — ED Notes (Signed)
Pt states that her heart rate has been as high as 120 in the past few days-- palpitations- currently pulse is 74- no ectopy noted on monitor- denies chest pain. Husband at bedside.

## 2020-01-31 NOTE — ED Notes (Signed)
Pt requested this nurse's last name- given to pt, pt also apologized for behavior stating" my husband can tell you that I am never like this"

## 2020-01-31 NOTE — ED Notes (Signed)
Pt would like her private MD and Cardiologist notified.

## 2020-02-01 ENCOUNTER — Other Ambulatory Visit: Payer: Self-pay | Admitting: Cardiovascular Disease

## 2020-02-01 DIAGNOSIS — R002 Palpitations: Secondary | ICD-10-CM | POA: Diagnosis not present

## 2020-02-01 DIAGNOSIS — R109 Unspecified abdominal pain: Secondary | ICD-10-CM

## 2020-02-01 DIAGNOSIS — R1084 Generalized abdominal pain: Secondary | ICD-10-CM

## 2020-02-01 DIAGNOSIS — I2510 Atherosclerotic heart disease of native coronary artery without angina pectoris: Secondary | ICD-10-CM | POA: Diagnosis not present

## 2020-02-01 DIAGNOSIS — R072 Precordial pain: Secondary | ICD-10-CM | POA: Diagnosis not present

## 2020-02-01 DIAGNOSIS — E034 Atrophy of thyroid (acquired): Secondary | ICD-10-CM | POA: Diagnosis not present

## 2020-02-02 ENCOUNTER — Encounter: Payer: Self-pay | Admitting: Internal Medicine

## 2020-02-02 ENCOUNTER — Ambulatory Visit: Payer: Medicare PPO | Admitting: Internal Medicine

## 2020-02-02 ENCOUNTER — Other Ambulatory Visit: Payer: Self-pay

## 2020-02-02 VITALS — BP 112/70 | HR 80 | Temp 98.6°F | Ht 62.0 in | Wt 140.4 lb

## 2020-02-02 DIAGNOSIS — N182 Chronic kidney disease, stage 2 (mild): Secondary | ICD-10-CM

## 2020-02-02 DIAGNOSIS — I1310 Hypertensive heart and chronic kidney disease without heart failure, with stage 1 through stage 4 chronic kidney disease, or unspecified chronic kidney disease: Secondary | ICD-10-CM | POA: Diagnosis not present

## 2020-02-02 DIAGNOSIS — E1122 Type 2 diabetes mellitus with diabetic chronic kidney disease: Secondary | ICD-10-CM

## 2020-02-02 DIAGNOSIS — E039 Hypothyroidism, unspecified: Secondary | ICD-10-CM | POA: Diagnosis not present

## 2020-02-02 DIAGNOSIS — I2510 Atherosclerotic heart disease of native coronary artery without angina pectoris: Secondary | ICD-10-CM

## 2020-02-02 DIAGNOSIS — Z6825 Body mass index (BMI) 25.0-25.9, adult: Secondary | ICD-10-CM

## 2020-02-02 NOTE — Progress Notes (Signed)
I,[name],acting as a Neurosurgeon for [name], MD.,have documented all relevant documentation on the behalf of [name], MD,as directed by  [name], MD while in the presence of [name], MD.  This visit occurred during the SARS-CoV-2 public health emergency.  Safety protocols were in place, including screening questions prior to the visit, additional usage of staff PPE, and extensive cleaning of exam room while observing appropriate contact time as indicated for disinfecting solutions.  Subjective:     Patient ID: [name] , female    DOB: 1945-07-27 , 74 y.o.   MRN: [id number]   Chief Complaint  Patient presents with  . Diabetes  . Hyperlipidemia    HPI  The patient is here today for a follow-up on her diabetes and blood pressure. Since her last visit, she was seen in ER on 11/7 for further evaluation of palpitations. She reports her Apple watch revealed heat rates in the 110s. She denies associated chest pain and shortness of breath. Her sx have persisted intermittently.   Diabetes She presents for her follow-up diabetic visit. She has type 2 diabetes mellitus. Pertinent negatives for hypoglycemia include no headaches. Pertinent negatives for diabetes include no blurred vision. There are no hypoglycemic complications. Diabetic complications include heart disease and nephropathy. Risk factors for coronary artery disease include diabetes mellitus, dyslipidemia, hypertension and post-menopausal. She is following a diabetic diet. She participates in exercise intermittently. An ACE inhibitor/angiotensin II receptor blocker is being taken. Eye exam is current.  Hypertension This is a chronic problem. The current episode started more than 1 year ago. The problem has been gradually improving since onset. The problem is controlled. Associated symptoms include palpitations. Pertinent negatives include no blurred vision or headaches. The current treatment  provides moderate improvement. 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     Past Medical History:  Diagnosis Date  . Benign hypertensive renal disease   . CKD stage 2 due to type 2 diabetes mellitus (HCC)   . Hypertension   . Hypothyroidism   . Obesity   . OSA on CPAP   . Pure hypercholesterolemia   . PVD (peripheral vascular disease) (HCC)   . Vitamin D deficiency      Family History  Problem Relation Age of Onset  . Alzheimer's disease Mother   . Aneurysm Father   . Cancer Brother      Current Outpatient Medications:  .  amLODipine (NORVASC) 5 MG tablet, Take 5 mg by mouth at bedtime. , Disp: , Rfl:  .  aspirin EC 81 MG tablet, Take 81 mg by mouth daily., Disp: , Rfl:  .  b complex vitamins tablet, Take 1 tablet by mouth daily with lunch. , Disp: , Rfl:  .  bisacodyl (DULCOLAX) 5 MG EC tablet, Take 5 mg by mouth daily as needed for moderate constipation. , Disp: , Rfl:  .  Calcium Carb-Cholecalciferol (CALCIUM + D3 PO), Take 1 tablet by mouth at bedtime., Disp: , Rfl:  .  cholecalciferol (VITAMIN D) 25 MCG (1000 UNIT) tablet, Take 1 tablet by mouth daily. , Disp: , Rfl:  .  cilostazol (PLETAL) 100 MG tablet, Take 1 tablet (100 mg total) by mouth daily., Disp: 90 tablet, Rfl: 2 .  Evolocumab (REPATHA SURECLICK) 140 MG/ML SOAJ, Inject 140 mg into the skin every 14 (fourteen) days., Disp: 2 mL, Rfl: 11 .  furosemide (LASIX) 20 MG tablet, Take 1 tablet (20 mg total) by mouth daily., Disp: 90  tablet, Rfl: 2 .  hydrALAZINE (APRESOLINE) 10 MG tablet, Take 1 tablet (10 mg total) by mouth every 8 (eight) hours. (Patient taking differently: Take 10 mg by mouth 3 (three) times daily with meals. ), Disp: 90 tablet, Rfl: 3 .  isosorbide mononitrate (IMDUR) 60 MG 24 hr tablet, Take 1 tablet (60 mg total) by mouth at bedtime., Disp: , Rfl:  .  levothyroxine (SYNTHROID) 125 MCG tablet, TAKE 1 TABLET DAILY ON MONDAY THRU FRIDAY (Patient taking differently: Take 125 mcg  by mouth See admin instructions. HOLD THYROID MED FOR 2 WEEKS Take 125 mcg by mouth in the morning on Mon/Tues/Wed/Thurs/Fri or as otherwise directed), Disp: 65 tablet, Rfl: 3 .  Magnesium 250 MG TABS, Take 1 tablet (250 mg total) by mouth at bedtime., Disp: 90 tablet, Rfl: 2 .  Multiple Vitamins-Minerals (MULTIVITAMIN WITH MINERALS) tablet, Take 1 tablet by mouth daily with lunch. , Disp: , Rfl:  .  nitroGLYCERIN (NITROSTAT) 0.4 MG SL tablet, Place 1 tablet (0.4 mg total) under the tongue every 5 (five) minutes x 3 doses as needed for chest pain., Disp: 25 tablet, Rfl: 1 .  olmesartan (BENICAR) 40 MG tablet, Take 1 tablet (40 mg total) by mouth daily., Disp: 90 tablet, Rfl: 2 .  potassium chloride (K-DUR) 10 MEQ tablet, Take 10 mEq by mouth 2 (two) times daily with breakfast and lunch. , Disp: , Rfl: 3 .  Semaglutide (OZEMPIC, 0.25 OR 0.5 MG/DOSE, [place]), Inject 0.25 mg into the skin every Thursday. Every other week (Dr. Algie Coffer), Disp: , Rfl:  .  albuterol (PROVENTIL HFA;VENTOLIN HFA) 108 (90 Base) MCG/ACT inhaler, Inhale 2 puffs into the lungs every 6 (six) hours as needed for wheezing or shortness of breath. (Patient not taking: Reported on 02/02/2020), Disp: 1 Inhaler, Rfl: 2 .  Multiple Vitamins-Minerals (EMERGEN-C IMMUNE PO), Take 1 packet by mouth daily as needed (TO BOOST THE IMMUNE SYSTEM).  (Patient not taking: Reported on 02/02/2020), Disp: , Rfl:    Allergies  Allergen Reactions  . Atorvastatin Swelling and Other (See Comments)    Limbs swell  . Clopidogrel Other (See Comments)    Caused bruising  . Metoprolol Other (See Comments)    Slows heart rate too low  . Metoprolol Tartrate Swelling and Other (See Comments)    Site of swelling not noted  . Pregabalin Swelling and Other (See Comments)    Site of swelling not noted  . Rosuvastatin Calcium Other (See Comments)    Unknown reaction     Review of Systems  Constitutional: Negative.   Eyes: Negative for blurred vision.   Respiratory: Negative.   Cardiovascular: Positive for palpitations.  Gastrointestinal: Negative.   Neurological: Negative for headaches.  Psychiatric/Behavioral: Negative.   All other systems reviewed and are negative.    Today's Vitals   02/02/20 1056  BP: 112/70  Pulse: 80  Temp: 98.6 F (37 C)  TempSrc: Oral  Weight: 140 lb 6.4 oz (63.7 kg)  Height: 5\' 2"  (1.575 m)  PainSc: 0-No pain   Body mass index is 25.68 kg/m.  Wt Readings from Last 3 Encounters:  02/02/20 140 lb 6.4 oz (63.7 kg)  01/04/20 149 lb (67.6 kg)  09/30/19 151 lb 3.2 oz (68.6 kg)   Objective:  Physical Exam Vitals and nursing note reviewed.  Constitutional:      Appearance: Normal appearance.  HENT:     Head: Normocephalic and atraumatic.  Cardiovascular:     Rate and Rhythm: Normal rate and regular rhythm.  Heart sounds: Normal heart sounds.  Pulmonary:     Breath sounds: Normal breath sounds.  Musculoskeletal:     Cervical back: Normal range of motion.  Skin:    General: Skin is warm.  Neurological:     General: No focal deficit present.     Mental Status: She is alert and oriented to person, place, and time.         Assessment And Plan:     1. Type 2 diabetes mellitus with stage 2 chronic kidney disease, without long-term current use of insulin (HCC) Comments: I will check an a1c today. I reviewed BMP from recent ER visit. She will change Ozempic dosing to every other week due to continued weight loss. - Hemoglobin A1c  2. Benign hypertensive heart and renal disease Comments: Chronic, well controlled. She will continue with current meds.  - Magnesium  3. Primary hypothyroidism Comments: I will check thyroid panel. Pt advised that this could contribute to her weight loss. I will adjust meds as needed.  - TSH - T4, Free  4. CAD in native artery Comments: Chronic, yet stable. Now on Repatha for hyperlipidemia. She has not had any issues with the medication thus far.  - Lipid  panel  5. Body mass index (BMI) of 25.0-25.9 in adult   She has lost 9 pounds since your last visit. I will check thyroid function today and adjust meds accordingly.  Wt Readings from Last 3 Encounters:  02/02/20 140 lb 6.4 oz (63.7 kg)  01/04/20 149 lb (67.6 kg)  09/30/19 151 lb 3.2 oz (68.6 kg)    Patient was given opportunity to ask questions. Patient verbalized understanding of the plan and was able to repeat key elements of the plan. All questions were answered to their satisfaction.  [name], MD   I, [name], MD, have reviewed all documentation for this visit. The documentation on 02/21/20 for the exam, diagnosis, procedures, and orders are all accurate and complete.  THE PATIENT IS ENCOURAGED TO PRACTICE SOCIAL DISTANCING DUE TO THE COVID-19 PANDEMIC.

## 2020-02-02 NOTE — Patient Instructions (Signed)
Diabetes Mellitus and Foot Care Foot care is an important part of your health, especially when you have diabetes. Diabetes may cause you to have problems because of poor blood flow (circulation) to your feet and legs, which can cause your skin to:  Become thinner and drier.  Break more easily.  Heal more slowly.  Peel and crack. You may also have nerve damage (neuropathy) in your legs and feet, causing decreased feeling in them. This means that you may not notice minor injuries to your feet that could lead to more serious problems. Noticing and addressing any potential problems early is the best way to prevent future foot problems. How to care for your feet Foot hygiene  Wash your feet daily with warm water and mild soap. Do not use hot water. Then, pat your feet and the areas between your toes until they are completely dry. Do not soak your feet as this can dry your skin.  Trim your toenails straight across. Do not dig under them or around the cuticle. File the edges of your nails with an emery board or nail file.  Apply a moisturizing lotion or petroleum jelly to the skin on your feet and to dry, brittle toenails. Use lotion that does not contain alcohol and is unscented. Do not apply lotion between your toes. Shoes and socks  Wear clean socks or stockings every day. Make sure they are not too tight. Do not wear knee-high stockings since they may decrease blood flow to your legs.  Wear shoes that fit properly and have enough cushioning. Always look in your shoes before you put them on to be sure there are no objects inside.  To break in new shoes, wear them for just a few hours a day. This prevents injuries on your feet. Wounds, scrapes, corns, and calluses  Check your feet daily for blisters, cuts, bruises, sores, and redness. If you cannot see the bottom of your feet, use a mirror or ask someone for help.  Do not cut corns or calluses or try to remove them with medicine.  If you  find a minor scrape, cut, or break in the skin on your feet, keep it and the skin around it clean and dry. You may clean these areas with mild soap and water. Do not clean the area with peroxide, alcohol, or iodine.  If you have a wound, scrape, corn, or callus on your foot, look at it several times a day to make sure it is healing and not infected. Check for: ? Redness, swelling, or pain. ? Fluid or blood. ? Warmth. ? Pus or a bad smell. General instructions  Do not cross your legs. This may decrease blood flow to your feet.  Do not use heating pads or hot water bottles on your feet. They may burn your skin. If you have lost feeling in your feet or legs, you may not know this is happening until it is too late.  Protect your feet from hot and cold by wearing shoes, such as at the beach or on hot pavement.  Schedule a complete foot exam at least once a year (annually) or more often if you have foot problems. If you have foot problems, report any cuts, sores, or bruises to your health care provider immediately. Contact a health care provider if:  You have a medical condition that increases your risk of infection and you have any cuts, sores, or bruises on your feet.  You have an injury that is not   healing.  You have redness on your legs or feet.  You feel burning or tingling in your legs or feet.  You have pain or cramps in your legs and feet.  Your legs or feet are numb.  Your feet always feel cold.  You have pain around a toenail. Get help right away if:  You have a wound, scrape, corn, or callus on your foot and: ? You have pain, swelling, or redness that gets worse. ? You have fluid or blood coming from the wound, scrape, corn, or callus. ? Your wound, scrape, corn, or callus feels warm to the touch. ? You have pus or a bad smell coming from the wound, scrape, corn, or callus. ? You have a fever. ? You have a red line going up your leg. Summary  Check your feet every day  for cuts, sores, red spots, swelling, and blisters.  Moisturize feet and legs daily.  Wear shoes that fit properly and have enough cushioning.  If you have foot problems, report any cuts, sores, or bruises to your health care provider immediately.  Schedule a complete foot exam at least once a year (annually) or more often if you have foot problems. This information is not intended to replace advice given to you by your health care provider. Make sure you discuss any questions you have with your health care provider. Document Revised: 12/03/2018 Document Reviewed: 04/13/2016 Elsevier Patient Education  2020 Elsevier Inc.  

## 2020-02-03 LAB — TSH: TSH: 0.074 u[IU]/mL — ABNORMAL LOW (ref 0.450–4.500)

## 2020-02-03 LAB — LIPID PANEL
Chol/HDL Ratio: 2.1 ratio (ref 0.0–4.4)
Cholesterol, Total: 141 mg/dL (ref 100–199)
HDL: 67 mg/dL (ref >39)
LDL Chol Calc (NIH): 62 mg/dL (ref 0–99)
Triglycerides: 54 mg/dL (ref 0–149)
VLDL Cholesterol Cal: 12 mg/dL (ref 5–40)

## 2020-02-03 LAB — HEMOGLOBIN A1C
Est. average glucose Bld gHb Est-mCnc: 117 mg/dL
Hgb A1c MFr Bld: 5.7 % — ABNORMAL HIGH (ref 4.8–5.6)

## 2020-02-03 LAB — T4, FREE: Free T4: 2.01 ng/dL — ABNORMAL HIGH (ref 0.82–1.77)

## 2020-02-03 LAB — MAGNESIUM: Magnesium: 2.1 mg/dL (ref 1.6–2.3)

## 2020-02-04 ENCOUNTER — Telehealth: Payer: Self-pay

## 2020-02-04 ENCOUNTER — Telehealth: Payer: Medicare PPO

## 2020-02-04 NOTE — Telephone Encounter (Cosign Needed)
  Chronic Care Management   Outreach Note  02/04/2020 Name: Journie Howson MRN: [id number] DOB: 1945-06-04  Referred by: [name], MD Reason for referral : Chronic Care Management (CCM RNCM FU Call )   An unsuccessful telephone outreach was attempted today. The patient was referred to the case management team for assistance with care management and care coordination.   Follow Up Plan: Telephone follow up appointment with care management team member scheduled for: 02/29/20  Delsa Sale, RN, BSN, CCM Care Management Coordinator [facility] Care Management/[facility]  Direct Phone: [phone]

## 2020-02-05 ENCOUNTER — Ambulatory Visit
Admission: RE | Admit: 2020-02-05 | Discharge: 2020-02-05 | Disposition: A | Discharge status: Home / Self Care | Payer: Medicare PPO | Source: Ambulatory Visit | Attending: Internal Medicine | Admitting: Internal Medicine

## 2020-02-05 ENCOUNTER — Telehealth: Payer: Self-pay

## 2020-02-05 ENCOUNTER — Other Ambulatory Visit: Payer: Self-pay

## 2020-02-05 ENCOUNTER — Ambulatory Visit (INDEPENDENT_AMBULATORY_CARE_PROVIDER_SITE_OTHER): Payer: Medicare PPO

## 2020-02-05 DIAGNOSIS — Z23 Encounter for immunization: Secondary | ICD-10-CM | POA: Diagnosis not present

## 2020-02-05 DIAGNOSIS — Z0000 Encounter for general adult medical examination without abnormal findings: Secondary | ICD-10-CM

## 2020-02-05 NOTE — Progress Notes (Signed)
   Covid-19 Vaccination Clinic  Name:  [name]    MRN: [id number] DOB: Sep 21, 1945  02/05/2020  [name] was observed post Covid-19 immunization for 15 minutes without incident. She was provided with Vaccine Information Sheet and instruction to access the V-Safe system.   [name] was instructed to call 911 with any severe reactions post vaccine: [name] Difficulty breathing  . Swelling of face and throat  . A fast heartbeat  . A bad rash all over body  . Dizziness and weakness

## 2020-02-05 NOTE — Telephone Encounter (Signed)
The pt wanted to know if she can take a fleet enema because she is constipated.   The pt was told that Ramen the NP said that the pt shouldn't use the fleet enema because of her kidney function and that the pt should try a suppository.

## 2020-02-08 ENCOUNTER — Ambulatory Visit: Payer: Self-pay

## 2020-02-08 ENCOUNTER — Other Ambulatory Visit: Payer: Self-pay

## 2020-02-08 ENCOUNTER — Telehealth: Payer: Medicare PPO

## 2020-02-08 ENCOUNTER — Telehealth: Payer: Self-pay | Admitting: *Deleted

## 2020-02-08 ENCOUNTER — Telehealth: Payer: Self-pay

## 2020-02-08 DIAGNOSIS — N182 Chronic kidney disease, stage 2 (mild): Secondary | ICD-10-CM

## 2020-02-08 DIAGNOSIS — E1122 Type 2 diabetes mellitus with diabetic chronic kidney disease: Secondary | ICD-10-CM

## 2020-02-08 DIAGNOSIS — I2510 Atherosclerotic heart disease of native coronary artery without angina pectoris: Secondary | ICD-10-CM

## 2020-02-08 DIAGNOSIS — I10 Essential (primary) hypertension: Secondary | ICD-10-CM

## 2020-02-08 DIAGNOSIS — E7800 Pure hypercholesterolemia, unspecified: Secondary | ICD-10-CM

## 2020-02-08 DIAGNOSIS — E039 Hypothyroidism, unspecified: Secondary | ICD-10-CM

## 2020-02-08 NOTE — Telephone Encounter (Signed)
The pt called and said that she needed to have an appt with Dr Allyne Gee, that she didn't want to communicate over the phone or over mychart that she wanted to come in.  The next available appt is Wednesday. The pt said she hasn't been able to sleep and wanted an appt that was sooner.  The pt was told to check back for cancellations.

## 2020-02-08 NOTE — Chronic Care Management (AMB) (Signed)
°  Care Management   Note  02/08/2020 Name: [name] MRN: [id number] DOB: 25-Dec-1945  [name] is a 74 y.o. year old female who is a primary care patient of [name], MD and is actively engaged with the care management team. I reached out to Karle Plumber by phone today to assist with re-scheduling a follow up visit with the Pharmacist.  Follow up plan: A telephone outreach attempt made patient wants an in office appointment now. Had patient call practice to see if she can be seen sooner will call back to reschedule follow call with Pharmacist. The care management team will reach out to the patient again over the next 7 days. If patient returns call to provider office, please advise to call Embedded Care Management Care Guide Gwenevere Ghazi at [phone].  Gwenevere Ghazi  Care Guide, Embedded Care Coordination [facility] Management

## 2020-02-08 NOTE — Telephone Encounter (Signed)
The pt was notified that Dr. Allyne Gee wants the pt to hold her thyroid med for 2 wks and come for a lab visit.

## 2020-02-09 ENCOUNTER — Telehealth: Payer: Self-pay

## 2020-02-09 NOTE — Telephone Encounter (Signed)
I checked with the pt to make sure that she didn't take her thyorid med this morning because she was instructed to hole the thyroid med for 2 wks yesterday and a lab visit was scheduled.  The pt said that she thought she was to take one every other day.  Mr. Burich got on the phone and wanted to check because he was told that he was supposed to come and pickup new thyroid med this morning from someone else.  Mr. Tackett was told that he was talking to dr sanders and that Dr. Allyne Gee changed it and had me call back and I spoke with Mrs. Patman yesterday and told her that Dr. Allyne Gee said to hold the med for 2 wks and I scheduled the lab appt to because dr sanders wants to  recheck her levels before she starts new thyroid med.  Mr. Parsell said that he understands and that the pt is acting better today.

## 2020-02-10 ENCOUNTER — Ambulatory Visit: Payer: Self-pay | Admitting: Internal Medicine

## 2020-02-10 NOTE — Chronic Care Management (AMB) (Signed)
Chronic Care Management   Follow Up Note   02/08/2020 Name: Jakyiah Briones MRN: [id number] DOB: 1946/02/21  Referred by: [name], MD Reason for referral : Chronic Care Management (CCM RNCM Case Collaboration )   Kalle Bernath is a 74 y.o. year old female who is a primary care patient of [name], MD. The CCM team was consulted for assistance with chronic disease management and care coordination needs.    Review of patient status, including review of consultants reports, relevant laboratory and other test results, and collaboration with appropriate care team members and the patient's provider was performed as part of comprehensive patient evaluation and provision of chronic care management services.    SDOH (Social Determinants of Health) assessments performed: Yes - no acute challenges identified  See Care Plan activities for detailed interventions related to SDOH)   CCM RN CM Case Collaboration with embedded BSW concerning patient's bizarre behavior and request to f/u with PCP.     Outpatient Encounter Medications as of 02/08/2020  Medication Sig Note  . albuterol (PROVENTIL HFA;VENTOLIN HFA) 108 (90 Base) MCG/ACT inhaler Inhale 2 puffs into the lungs every 6 (six) hours as needed for wheezing or shortness of breath. (Patient not taking: Reported on 02/02/2020)   . amLODipine (NORVASC) 5 MG tablet Take 5 mg by mouth at bedtime.    [name] aspirin EC 81 MG tablet Take 81 mg by mouth daily. 01/31/2020: Patient would not disclose  . b complex vitamins tablet Take 1 tablet by mouth daily with lunch.  01/31/2020: Patient would not disclose  . bisacodyl (DULCOLAX) 5 MG EC tablet Take 5 mg by mouth daily as needed for moderate constipation.  01/31/2020: Patient would not disclose  . Calcium Carb-Cholecalciferol (CALCIUM + D3 PO) Take 1 tablet by mouth at bedtime. 01/31/2020: Patient would not disclose  . cholecalciferol (VITAMIN D) 25 MCG (1000 UNIT) tablet Take 1 tablet by  mouth daily.  01/31/2020: Patient would not disclose  . cilostazol (PLETAL) 100 MG tablet Take 1 tablet (100 mg total) by mouth daily.   . Evolocumab (REPATHA SURECLICK) 140 MG/ML SOAJ Inject 140 mg into the skin every 14 (fourteen) days.   . furosemide (LASIX) 20 MG tablet Take 1 tablet (20 mg total) by mouth daily.   . hydrALAZINE (APRESOLINE) 10 MG tablet Take 1 tablet (10 mg total) by mouth every 8 (eight) hours. (Patient taking differently: Take 10 mg by mouth 3 (three) times daily with meals. )   . isosorbide mononitrate (IMDUR) 60 MG 24 hr tablet Take 1 tablet (60 mg total) by mouth at bedtime.   [name] levothyroxine (SYNTHROID) 125 MCG tablet TAKE 1 TABLET DAILY ON MONDAY THRU FRIDAY (Patient taking differently: Take 125 mcg by mouth See admin instructions. HOLD THYROID MED FOR 2 WEEKS Take 125 mcg by mouth in the morning on Mon/Tues/Wed/Thurs/Fri or as otherwise directed)   . Magnesium 250 MG TABS Take 1 tablet (250 mg total) by mouth at bedtime.   . Multiple Vitamins-Minerals (EMERGEN-C IMMUNE PO) Take 1 packet by mouth daily as needed (TO BOOST THE IMMUNE SYSTEM).  (Patient not taking: Reported on 02/02/2020)   . Multiple Vitamins-Minerals (MULTIVITAMIN WITH MINERALS) tablet Take 1 tablet by mouth daily with lunch.    . nitroGLYCERIN (NITROSTAT) 0.4 MG SL tablet Place 1 tablet (0.4 mg total) under the tongue every 5 (five) minutes x 3 doses as needed for chest pain.   [name] olmesartan (BENICAR) 40 MG tablet Take 1 tablet (40 mg total) by  mouth daily.   . potassium chloride (K-DUR) 10 MEQ tablet Take 10 mEq by mouth 2 (two) times daily with breakfast and lunch.    . Semaglutide (OZEMPIC, 0.25 OR 0.5 MG/DOSE, [place]) Inject 0.25 mg into the skin every Thursday. Every other week (Dr. Algie Coffer)    No facility-administered encounter medications on file as of 02/08/2020.     Objective:  Lab Results  Component Value Date   HGBA1C 5.7 (H) 02/02/2020   HGBA1C 5.7 (H) 09/30/2019   HGBA1C 5.6 06/02/2019    Lab Results  Component Value Date   MICROALBUR 10 05/27/2019   LDLCALC 62 02/02/2020   CREATININE 1.11 (H) 01/31/2020   BP Readings from Last 3 Encounters:  02/02/20 112/70  01/31/20 (!) 156/69  01/04/20 110/72    Goals Addressed    . Evaluate and treat hyper/hypothyroidism       CARE PLAN ENTRY (see longitudinal plan of care for additional care plan information)  Current Barriers:  [name] Knowledge Deficits related to evaluate and treat thyroid dysfunction  . 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 Inter-disciplinary care team collaboration (see longitudinal plan of care) . Collaborated with embedded BSW [name] regarding reported concerns by the [facility] Care Guide regarding patient's bizarre behavior during a call . Discussed Mrs. Tarver stated to the Care Guide she needs to be seen by the PCP and she will hold all of her medications until she is able to f/u with a PCP provider, she also voiced feeling her phone lines are tapped  . Determined Mrs. Breese spoke with the PCP CMA regarding her symptoms and scheduled a f/u appointment with Dr. Allyne Gee to be seen on Wednesday this week . Collaborated with PCP Dr. [name] regarding patient's bizarre behaviors as reported by the [facility] Care Guide and concerns she may need to be seen in the office today or as soon as possible  . Determined Dr. Allyne Gee spoke with Mrs. Balik and provided instructions regarding her prescribed Synthroid with recommendations for a lab check which has been scheduled  . Attempted a CCM RN CM follow up call to patient without success, left a HIPAA approved voice message requesting  a return call  Patient Self Care Activities:  . Self administers medications as prescribed . Attends all scheduled provider appointments . Calls pharmacy for medication refills . Calls provider office for new concerns or questions  Initial goal documentation       Plan:   Telephone follow up appointment with care management team member scheduled for: 02/23/20  Delsa Sale, RN, BSN, CCM Care Management Coordinator [facility] Care Management/[facility]  Direct Phone: [phone]

## 2020-02-12 ENCOUNTER — Telehealth: Payer: Medicare PPO

## 2020-02-16 ENCOUNTER — Telehealth: Payer: Self-pay

## 2020-02-16 ENCOUNTER — Ambulatory Visit: Payer: Self-pay | Admitting: Pharmacist

## 2020-02-16 NOTE — Telephone Encounter (Signed)
lvm to check on patient  °

## 2020-02-16 NOTE — Chronic Care Management (AMB) (Signed)
Chronic Care Management   Follow Up Note   02/16/2020 Name: [name] MRN: [id number] DOB: [birth year]  Referred by: [name], MD Reason for referral : No chief complaint on file.   [name] is a 74 y.o. year old female who is a primary care patient of [name], MD. The CCM team was consulted for assistance with chronic disease management and care coordination needs.    Review of patient status, including review of consultants reports, relevant laboratory and other test results, and collaboration with appropriate care team members and the patient's provider was performed as part of comprehensive patient evaluation and provision of chronic care management services.    SDOH (Social Determinants of Health) assessments performed: No See Care Plan activities for detailed interventions related to [facility])     Outpatient Encounter Medications as of 02/16/2020  Medication Sig Note  . albuterol (PROVENTIL HFA;VENTOLIN HFA) 108 (90 Base) MCG/ACT inhaler Inhale 2 puffs into the lungs every 6 (six) hours as needed for wheezing or shortness of breath. (Patient not taking: Reported on 02/02/2020)   . amLODipine (NORVASC) 5 MG tablet Take 5 mg by mouth at bedtime.    [name] aspirin EC 81 MG tablet Take 81 mg by mouth daily. 01/31/2020: Patient would not disclose  . b complex vitamins tablet Take 1 tablet by mouth daily with lunch.  01/31/2020: Patient would not disclose  . bisacodyl (DULCOLAX) 5 MG EC tablet Take 5 mg by mouth daily as needed for moderate constipation.  01/31/2020: Patient would not disclose  . Calcium Carb-Cholecalciferol (CALCIUM + D3 PO) Take 1 tablet by mouth at bedtime. 01/31/2020: Patient would not disclose  . cholecalciferol (VITAMIN D) 25 MCG (1000 UNIT) tablet Take 1 tablet by mouth daily.  01/31/2020: Patient would not disclose  . cilostazol (PLETAL) 100 MG tablet Take 1 tablet (100 mg total) by mouth daily.   . Evolocumab (REPATHA SURECLICK) 140 MG/ML  SOAJ Inject 140 mg into the skin every 14 (fourteen) days.   . furosemide (LASIX) 20 MG tablet Take 1 tablet (20 mg total) by mouth daily.   . hydrALAZINE (APRESOLINE) 10 MG tablet Take 1 tablet (10 mg total) by mouth every 8 (eight) hours. (Patient taking differently: Take 10 mg by mouth 3 (three) times daily with meals. )   . isosorbide mononitrate (IMDUR) 60 MG 24 hr tablet Take 1 tablet (60 mg total) by mouth at bedtime.   [name] levothyroxine (SYNTHROID) 125 MCG tablet TAKE 1 TABLET DAILY ON MONDAY THRU FRIDAY (Patient taking differently: Take 125 mcg by mouth See admin instructions. HOLD THYROID MED FOR 2 WEEKS Take 125 mcg by mouth in the morning on Mon/Tues/Wed/Thurs/Fri or as otherwise directed)   . Magnesium 250 MG TABS Take 1 tablet (250 mg total) by mouth at bedtime.   . Multiple Vitamins-Minerals (EMERGEN-C IMMUNE PO) Take 1 packet by mouth daily as needed (TO BOOST THE IMMUNE SYSTEM).  (Patient not taking: Reported on 02/02/2020)   . Multiple Vitamins-Minerals (MULTIVITAMIN WITH MINERALS) tablet Take 1 tablet by mouth daily with lunch.    . nitroGLYCERIN (NITROSTAT) 0.4 MG SL tablet Place 1 tablet (0.4 mg total) under the tongue every 5 (five) minutes x 3 doses as needed for chest pain.   [name] olmesartan (BENICAR) 40 MG tablet Take 1 tablet (40 mg total) by mouth daily.   . potassium chloride (K-DUR) 10 MEQ tablet Take 10 mEq by mouth 2 (two) times daily with breakfast and lunch.    . Semaglutide (  OZEMPIC, 0.25 OR 0.5 MG/DOSE, ) Inject 0.25 mg into the skin every Thursday. Every other week (Dr. Algie Coffer)    No facility-administered encounter medications on file as of 02/16/2020.     Objective:  Analyzed care gaps and adherence for STAR measures. Goals Addressed   None     There are no care plans to display for this patient. Care Gaps - 1  Need MD and result documented from diabetic eye exam  Adherence  Patient not under any of the adherence criteria.  She appears adherent from  fill history, will continue to follow her adherence.   Plan:   The patient has been provided with contact information for the care management team and has been advised to call with any health related questions or concerns.    Willa Frater, PharmD Clinical Pharmacist [facility] [phone]

## 2020-02-23 ENCOUNTER — Other Ambulatory Visit: Payer: Self-pay

## 2020-02-23 ENCOUNTER — Ambulatory Visit: Payer: Medicare PPO

## 2020-02-23 ENCOUNTER — Other Ambulatory Visit: Payer: Medicare PPO

## 2020-02-23 DIAGNOSIS — E039 Hypothyroidism, unspecified: Secondary | ICD-10-CM | POA: Diagnosis not present

## 2020-02-23 DIAGNOSIS — N182 Chronic kidney disease, stage 2 (mild): Secondary | ICD-10-CM

## 2020-02-23 DIAGNOSIS — E1122 Type 2 diabetes mellitus with diabetic chronic kidney disease: Secondary | ICD-10-CM

## 2020-02-23 DIAGNOSIS — I10 Essential (primary) hypertension: Secondary | ICD-10-CM

## 2020-02-23 MED ORDER — LEVOTHYROXINE SODIUM 112 MCG PO TABS: 112.0000 ug | ORAL_TABLET | Freq: Every day | ORAL | 11 refills | Status: DC

## 2020-02-23 NOTE — Chronic Care Management (AMB) (Signed)
Chronic Care Management    Social Work Follow Up Note  02/23/2020 Name: [name] MRN: [id number] DOB: [birth year]  [name] is a 74 y.o. year old female who is a primary care patient of [name], MD. The CCM team was consulted for assistance with care coordination.   Review of patient status, including review of consultants reports, other relevant assessments, and collaboration with appropriate care team members and the patient's provider was performed as part of comprehensive patient evaluation and provision of chronic care management services.    SW placed a successful outbound call to the patient to assist with care coordination needs. The patient denies SW needs at this time. SW encouraged the patient to contact care management team as needed.    Outpatient Encounter Medications as of 02/23/2020  Medication Sig Note  . albuterol (PROVENTIL HFA;VENTOLIN HFA) 108 (90 Base) MCG/ACT inhaler Inhale 2 puffs into the lungs every 6 (six) hours as needed for wheezing or shortness of breath. (Patient not taking: Reported on 02/02/2020)   . amLODipine (NORVASC) 5 MG tablet Take 5 mg by mouth at bedtime.    [name] aspirin EC 81 MG tablet Take 81 mg by mouth daily. 01/31/2020: Patient would not disclose  . b complex vitamins tablet Take 1 tablet by mouth daily with lunch.  01/31/2020: Patient would not disclose  . bisacodyl (DULCOLAX) 5 MG EC tablet Take 5 mg by mouth daily as needed for moderate constipation.  01/31/2020: Patient would not disclose  . Calcium Carb-Cholecalciferol (CALCIUM + D3 PO) Take 1 tablet by mouth at bedtime. 01/31/2020: Patient would not disclose  . cholecalciferol (VITAMIN D) 25 MCG (1000 UNIT) tablet Take 1 tablet by mouth daily.  01/31/2020: Patient would not disclose  . cilostazol (PLETAL) 100 MG tablet Take 1 tablet (100 mg total) by mouth daily.   . Evolocumab (REPATHA SURECLICK) 140 MG/ML SOAJ Inject 140 mg into the skin every 14 (fourteen) days.   .  furosemide (LASIX) 20 MG tablet Take 1 tablet (20 mg total) by mouth daily.   . hydrALAZINE (APRESOLINE) 10 MG tablet Take 1 tablet (10 mg total) by mouth every 8 (eight) hours. (Patient taking differently: Take 10 mg by mouth 3 (three) times daily with meals. )   . isosorbide mononitrate (IMDUR) 60 MG 24 hr tablet Take 1 tablet (60 mg total) by mouth at bedtime.   [name] levothyroxine (SYNTHROID) 112 MCG tablet Take 1 tablet (112 mcg total) by mouth daily.   [name] levothyroxine (SYNTHROID) 125 MCG tablet TAKE 1 TABLET DAILY ON MONDAY THRU FRIDAY (Patient taking differently: Take 125 mcg by mouth See admin instructions. HOLD THYROID MED FOR 2 WEEKS Take 125 mcg by mouth in the morning on Mon/Tues/Wed/Thurs/Fri or as otherwise directed)   . Magnesium 250 MG TABS Take 1 tablet (250 mg total) by mouth at bedtime.   . Multiple Vitamins-Minerals (EMERGEN-C IMMUNE PO) Take 1 packet by mouth daily as needed (TO BOOST THE IMMUNE SYSTEM).  (Patient not taking: Reported on 02/02/2020)   . Multiple Vitamins-Minerals (MULTIVITAMIN WITH MINERALS) tablet Take 1 tablet by mouth daily with lunch.    . nitroGLYCERIN (NITROSTAT) 0.4 MG SL tablet Place 1 tablet (0.4 mg total) under the tongue every 5 (five) minutes x 3 doses as needed for chest pain.   [name] olmesartan (BENICAR) 40 MG tablet Take 1 tablet (40 mg total) by mouth daily.   . potassium chloride (K-DUR) 10 MEQ tablet Take 10 mEq by mouth 2 (two) times daily  with breakfast and lunch.    . Semaglutide (OZEMPIC, 0.25 OR 0.5 MG/DOSE, [place]) Inject 0.25 mg into the skin every Thursday. Every other week (Dr. Algie Coffer)    No facility-administered encounter medications on file as of 02/23/2020.     Follow Up Plan: SW will follow up with patient by phone over the next 90 days.  [name], BSW, CDP Social Worker, Certified Dementia Practitioner [facility] Care Management [phone]

## 2020-02-24 LAB — TSH: TSH: 7.47 u[IU]/mL — ABNORMAL HIGH (ref 0.450–4.500)

## 2020-02-24 NOTE — Chronic Care Management (AMB) (Signed)
Willa Frater [facility] called patient on 02/16/2020 will close encounter patient needs to be scheduled for 3 month follow up.  Gwenevere Ghazi  Care Guide, Embedded Care Coordination [facility] Management  Direct Dial [phone]

## 2020-02-25 ENCOUNTER — Ambulatory Visit
Admission: RE | Admit: 2020-02-25 | Discharge: 2020-02-25 | Disposition: A | Discharge status: Home / Self Care | Payer: Medicare PPO | Source: Ambulatory Visit | Attending: Cardiovascular Disease | Admitting: Cardiovascular Disease

## 2020-02-25 DIAGNOSIS — R109 Unspecified abdominal pain: Secondary | ICD-10-CM

## 2020-02-25 DIAGNOSIS — K8020 Calculus of gallbladder without cholecystitis without obstruction: Secondary | ICD-10-CM | POA: Diagnosis not present

## 2020-02-25 DIAGNOSIS — R634 Abnormal weight loss: Secondary | ICD-10-CM | POA: Diagnosis not present

## 2020-02-25 DIAGNOSIS — Z90710 Acquired absence of both cervix and uterus: Secondary | ICD-10-CM | POA: Diagnosis not present

## 2020-02-25 DIAGNOSIS — I700 Atherosclerosis of aorta: Secondary | ICD-10-CM | POA: Diagnosis not present

## 2020-02-25 DIAGNOSIS — R1084 Generalized abdominal pain: Secondary | ICD-10-CM

## 2020-02-25 MED ORDER — IOPAMIDOL (ISOVUE-300) INJECTION 61%
100.0000 mL | Freq: Once | INTRAVENOUS | Status: AC | PRN
  Administered 2020-02-25: 100 mL via INTRAVENOUS

## 2020-02-26 ENCOUNTER — Other Ambulatory Visit: Payer: Self-pay

## 2020-02-26 ENCOUNTER — Ambulatory Visit
Admission: RE | Admit: 2020-02-26 | Discharge: 2020-02-26 | Disposition: A | Discharge status: Home / Self Care | Payer: Medicare PPO | Source: Ambulatory Visit | Attending: Internal Medicine | Admitting: Internal Medicine

## 2020-02-26 ENCOUNTER — Telehealth: Payer: Self-pay

## 2020-02-26 DIAGNOSIS — Z1231 Encounter for screening mammogram for malignant neoplasm of breast: Secondary | ICD-10-CM | POA: Diagnosis not present

## 2020-02-29 ENCOUNTER — Telehealth: Payer: Medicare PPO

## 2020-02-29 ENCOUNTER — Telehealth: Payer: Self-pay

## 2020-02-29 NOTE — Telephone Encounter (Cosign Needed)
  Chronic Care Management   Outreach Note  02/29/2020 Name: [name] MRN: [id number] DOB: [birth year]  Referred by: [name], MD Reason for referral : Chronic Care Management (RNCM FU Call )   Aubrie Lucien is enrolled in a Managed Medicaid Health Plan: No  A second unsuccessful telephone outreach was attempted today. The patient was referred to the case management team for assistance with care management and care coordination.   Follow Up Plan: Telephone follow up appointment with care management team member scheduled for: 04/07/20  Delsa Sale, RN, BSN, CCM Care Management Coordinator [facility] Care Management/[facility]  Direct Phone: [phone]

## 2020-03-02 DIAGNOSIS — R634 Abnormal weight loss: Secondary | ICD-10-CM | POA: Diagnosis not present

## 2020-03-02 DIAGNOSIS — E034 Atrophy of thyroid (acquired): Secondary | ICD-10-CM | POA: Diagnosis not present

## 2020-03-02 DIAGNOSIS — I2510 Atherosclerotic heart disease of native coronary artery without angina pectoris: Secondary | ICD-10-CM | POA: Diagnosis not present

## 2020-03-02 DIAGNOSIS — R072 Precordial pain: Secondary | ICD-10-CM | POA: Diagnosis not present

## 2020-03-03 ENCOUNTER — Other Ambulatory Visit: Payer: Self-pay

## 2020-03-03 MED ORDER — NITROGLYCERIN 0.4 MG SL SUBL: 0.4000 mg | SUBLINGUAL_TABLET | SUBLINGUAL | 1 refills | Status: DC | PRN

## 2020-03-06 ENCOUNTER — Encounter: Payer: Self-pay | Admitting: Internal Medicine

## 2020-03-07 ENCOUNTER — Telehealth: Payer: Self-pay

## 2020-03-07 NOTE — Telephone Encounter (Signed)
LVM regarding pt MYCHART message

## 2020-03-11 ENCOUNTER — Telehealth: Payer: Self-pay

## 2020-03-11 MED ORDER — LEVOTHYROXINE SODIUM 112 MCG PO TABS: 112.0000 ug | ORAL_TABLET | Freq: Every day | ORAL | 1 refills | Status: DC

## 2020-03-11 NOTE — Telephone Encounter (Signed)
Levothyroxine refill

## 2020-03-16 ENCOUNTER — Telehealth: Payer: Self-pay

## 2020-03-16 NOTE — Progress Notes (Signed)
Chronic Care Management Pharmacy Assistant   Name: [name]  MRN: [id number] DOB: December 16, 1945  Reason for Encounter: Medication Review    PCP : Catherine Peng, MD  Allergies:   Allergies  Allergen Reactions  . Atorvastatin Swelling and Other (See Comments)    Limbs swell  . Clopidogrel Other (See Comments)    Caused bruising  . Metoprolol Other (See Comments)    Slows heart rate too low  . Metoprolol Tartrate Swelling and Other (See Comments)    Site of swelling not noted  . Pregabalin Swelling and Other (See Comments)    Site of swelling not noted  . Rosuvastatin Calcium Other (See Comments)    Unknown reaction    Medications: Outpatient Encounter Medications as of 03/16/2020  Medication Sig Note  . albuterol (PROVENTIL HFA;VENTOLIN HFA) 108 (90 Base) MCG/ACT inhaler Inhale 2 puffs into the lungs every 6 (six) hours as needed for wheezing or shortness of breath. (Patient not taking: Reported on 02/02/2020)   . amLODipine (NORVASC) 5 MG tablet Take 5 mg by mouth at bedtime. 1/2 tab (Dr. Algie Coffer)   . aspirin EC 81 MG tablet Take 81 mg by mouth daily. 01/31/2020: Patient would not disclose  . b complex vitamins tablet Take 1 tablet by mouth daily with lunch.  01/31/2020: Patient would not disclose  . bisacodyl (DULCOLAX) 5 MG EC tablet Take 5 mg by mouth daily as needed for moderate constipation.  01/31/2020: Patient would not disclose  . Calcium Carb-Cholecalciferol (CALCIUM + D3 PO) Take 1 tablet by mouth at bedtime. 01/31/2020: Patient would not disclose  . cholecalciferol (VITAMIN D) 25 MCG (1000 UNIT) tablet Take 1 tablet by mouth daily.  01/31/2020: Patient would not disclose  . cilostazol (PLETAL) 100 MG tablet Take 1 tablet (100 mg total) by mouth daily.   . Evolocumab (REPATHA SURECLICK) 140 MG/ML SOAJ Inject 140 mg into the skin every 14 (fourteen) days.   . furosemide (LASIX) 20 MG tablet Take 1 tablet (20 mg total) by mouth daily.   . hydrALAZINE  (APRESOLINE) 10 MG tablet Take 1 tablet (10 mg total) by mouth every 8 (eight) hours. (Patient taking differently: Take 10 mg by mouth 3 (three) times daily with meals. )   . isosorbide mononitrate (IMDUR) 60 MG 24 hr tablet Take 1 tablet (60 mg total) by mouth at bedtime.   [name] levothyroxine (SYNTHROID) 112 MCG tablet Take 1 tablet (112 mcg total) by mouth daily. Monday - Saturday   . Magnesium 250 MG TABS Take 1 tablet (250 mg total) by mouth at bedtime.   . Multiple Vitamins-Minerals (EMERGEN-C IMMUNE PO) Take 1 packet by mouth daily as needed (TO BOOST THE IMMUNE SYSTEM).  (Patient not taking: Reported on 02/02/2020)   . Multiple Vitamins-Minerals (MULTIVITAMIN WITH MINERALS) tablet Take 1 tablet by mouth daily with lunch.    . nitroGLYCERIN (NITROSTAT) 0.4 MG SL tablet Place 1 tablet (0.4 mg total) under the tongue every 5 (five) minutes x 3 doses as needed for chest pain.   [name] olmesartan (BENICAR) 40 MG tablet Take 1 tablet (40 mg total) by mouth daily.   . potassium chloride (K-DUR) 10 MEQ tablet Take 10 mEq by mouth 2 (two) times daily with breakfast and lunch.    . Semaglutide (OZEMPIC, 0.25 OR 0.5 MG/DOSE, [place]) Inject 0.25 mg into the skin every Thursday. Every other week (Dr. Algie Coffer)    No facility-administered encounter medications on file as of 03/16/2020.    Current Diagnosis: Patient  Active Problem List   Diagnosis Date Noted  . Hyperlipidemia 10/30/2019  . Statin myopathy 10/30/2019  . Acute bronchitis 06/06/2018  . Type 2 diabetes mellitus with stage 2 chronic kidney disease, without long-term current use of insulin (HCC) 02/17/2018  . Chronic renal disease, stage II 02/17/2018  . Benign hypertensive heart and renal disease 02/17/2018  . Primary hypothyroidism 02/17/2018  . Localized osteoarthritis of right knee 02/17/2018  . Essential hypertension 06/12/2017  . Acute coronary syndrome (HCC) 12/26/2016  . Hypertensive heart disease without heart failure 10/06/2015  . Insomnia  with sleep apnea 10/06/2015  . OSA on CPAP 10/06/2015  . 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 CAD in native artery 10/06/2015  . Chest pain at rest 07/18/2015    Class: Acute  . Weakness 07/18/2015    Class: Acute  . Insomnia 05/05/2015  . Primary snoring 05/05/2015      Follow-Up:  Patient Assistance Coordination  New patient assistance form filled out to [employer] for [employer]. Waiting for provider and patient's signature.  Called patient to have her come in to PCP office to bring proof of income and sign application . Per patient she will be in on 03/22/2020.  [name],CCP Notified  [name], [facility] Clinical Pharmacist Assistant [phone]

## 2020-03-17 ENCOUNTER — Telehealth: Payer: Self-pay

## 2020-03-17 NOTE — Telephone Encounter (Signed)
I returned the pt's call and notified her that her Ozempic hasn't been shipped from the pt assistant program and that the office would call her once it's been delivered.

## 2020-03-31 ENCOUNTER — Telehealth: Payer: Self-pay

## 2020-03-31 NOTE — Telephone Encounter (Signed)
The pt confirmed her lab appointment for 04/04/2020.

## 2020-04-04 ENCOUNTER — Other Ambulatory Visit: Payer: Self-pay | Admitting: Internal Medicine

## 2020-04-04 ENCOUNTER — Other Ambulatory Visit: Payer: Self-pay

## 2020-04-04 ENCOUNTER — Other Ambulatory Visit: Payer: Medicare PPO

## 2020-04-04 DIAGNOSIS — E119 Type 2 diabetes mellitus without complications: Secondary | ICD-10-CM | POA: Diagnosis not present

## 2020-04-04 DIAGNOSIS — E1165 Type 2 diabetes mellitus with hyperglycemia: Secondary | ICD-10-CM | POA: Diagnosis not present

## 2020-04-04 DIAGNOSIS — E039 Hypothyroidism, unspecified: Secondary | ICD-10-CM

## 2020-04-04 DIAGNOSIS — Z794 Long term (current) use of insulin: Secondary | ICD-10-CM | POA: Diagnosis not present

## 2020-04-05 ENCOUNTER — Telehealth: Payer: Self-pay

## 2020-04-05 DIAGNOSIS — E034 Atrophy of thyroid (acquired): Secondary | ICD-10-CM | POA: Diagnosis not present

## 2020-04-05 DIAGNOSIS — I2510 Atherosclerotic heart disease of native coronary artery without angina pectoris: Secondary | ICD-10-CM | POA: Diagnosis not present

## 2020-04-05 DIAGNOSIS — E7849 Other hyperlipidemia: Secondary | ICD-10-CM | POA: Diagnosis not present

## 2020-04-05 DIAGNOSIS — R072 Precordial pain: Secondary | ICD-10-CM | POA: Diagnosis not present

## 2020-04-05 LAB — T4, FREE: Free T4: 2.13 ng/dL — ABNORMAL HIGH (ref 0.82–1.77)

## 2020-04-05 LAB — TSH: TSH: 0.205 u[IU]/mL — ABNORMAL LOW (ref 0.450–4.500)

## 2020-04-05 NOTE — Telephone Encounter (Signed)
The pt was notified that Dr Allyne Gee said to skip tomorrow's dose of 100 mcg synthroid, take Thursday - Saturday and then new dose will be 100 mcg Monday - Saturday.

## 2020-04-06 ENCOUNTER — Encounter: Payer: Self-pay | Admitting: Internal Medicine

## 2020-04-07 ENCOUNTER — Ambulatory Visit: Payer: Self-pay

## 2020-04-07 ENCOUNTER — Other Ambulatory Visit: Payer: Self-pay

## 2020-04-07 ENCOUNTER — Telehealth: Payer: Medicare PPO

## 2020-04-07 DIAGNOSIS — E1122 Type 2 diabetes mellitus with diabetic chronic kidney disease: Secondary | ICD-10-CM

## 2020-04-07 DIAGNOSIS — E7800 Pure hypercholesterolemia, unspecified: Secondary | ICD-10-CM

## 2020-04-07 DIAGNOSIS — N182 Chronic kidney disease, stage 2 (mild): Secondary | ICD-10-CM

## 2020-04-07 DIAGNOSIS — E039 Hypothyroidism, unspecified: Secondary | ICD-10-CM

## 2020-04-07 DIAGNOSIS — I10 Essential (primary) hypertension: Secondary | ICD-10-CM

## 2020-04-12 NOTE — Patient Instructions (Signed)
Visit Information Goals    .  "to manage my thyroid disorder" (pt-stated)      Timeframe:  Long-Range Goal Priority:  High Start Date:  04/07/20                           Expected End Date:  07/06/20  Follow up date: 05/30/20  Over the next 90 days, patient will: -take Synthroid exactly as prescribed w/o missed doses -Call PCP and or CCM team for questions or concerns -Keep all scheduled MD follow up appointments as recommended                         Patient Care Plan: Thyroid Disorder    Problem Identified: Thyroid Disorder   Priority: High    Long-Range Goal: Manage thyroid dysfuction   Start Date: 04/07/2020  Expected End Date: 07/06/2020  This Visit's Progress: On track  Priority: High  Note:   Current Barriers:   Ineffective Self Health Maintenance  Currently UNABLE TO independently self manage needs related to chronic health conditions.   Knowledge Deficits related to short term plan for care coordination needs and long term plans for chronic disease management needs Case Manager Clinical Goal(s):  [name] Collaboration with [name], MD regarding development and update of comprehensive plan of care as evidenced by provider attestation and co-signature . Inter-disciplinary care team collaboration (see longitudinal plan of care)  Over the next 90 days, patient will work with care management team to address care coordination and chronic disease management needs related to Disease Management  Educational Needs  Care Coordination  Medication Management and Education  Psychosocial Support   Interventions:   Reviewed and discussed current lab results for thyroid function   Determined PCP recommended a Synthroid dosage change/frequency for treatment of abnormal thyroid levels  Reviewed and discussed the following medication dosage change per PCP;  Levothyroxine (Synthroid) take 100 mcg daily before breakfast, Monday-Saturday  Determined patient verbalizes  understanding of the new dosage/frequency, confirmed she picked up the new Rx from the pharmacy as directed   Re-educated on how to take Synthroid first thing in the morning 30 minutes before eating/drinking or taking other medications, instructed to wait at least 4 hours prior to taking antiacids or vitamins   Reviewed and discussed next PCP follow up appointment scheduled for 05/26/20 @10 :30 am   Discussed plans with patient for ongoing care management follow up and provided patient with direct contact information for care management team Patient Goals/Self-Care Activities:  Over the next 90 days, patient will: -take Synthroid exactly as prescribed w/o missed doses -Call PCP and or CCM team for questions or concerns -Keep all scheduled MD follow up appointments as recommended  Follow Up Plan: Telephone follow up appointment with care management team member scheduled for: 05/30/20     The patient verbalized understanding of instructions, educational materials, and care plan provided today and declined offer to receive copy of patient instructions, educational materials, and care plan.   Telephone follow up appointment with care management team member scheduled for: 05/30/20  07/30/20, RN

## 2020-04-12 NOTE — Chronic Care Management (AMB) (Signed)
Chronic Care Management   CCM RN Visit Note  04/07/2020 Name: [name] MRN: [id number] DOB: Apr 06, 1945  Subjective: [name] is a 75 y.o. year old female who is a primary care patient of [name], MD. The care management team was consulted for assistance with disease management and care coordination needs.    Engaged with patient by telephone for follow up visit in response to provider referral for case management and/or care coordination services.   Consent to Services:  The patient was given information about Chronic Care Management services, agreed to services, and gave verbal consent prior to initiation of services.  Please see initial visit note for detailed documentation.   Patient agreed to services and verbal consent obtained.   Assessment: Review of patient past medical history, allergies, medications, health status, including review of consultants reports, laboratory and other test data, was performed as part of comprehensive evaluation and provision of chronic care management services.   SDOH (Social Determinants of Health) assessments and interventions performed:  Yes, no acute needs   CCM Care Plan  Allergies  Allergen Reactions  . Atorvastatin Swelling and Other (See Comments)    Limbs swell  . Clopidogrel Other (See Comments)    Caused bruising  . Metoprolol Other (See Comments)    Slows heart rate too low  . Metoprolol Tartrate Swelling and Other (See Comments)    Site of swelling not noted  . Pregabalin Swelling and Other (See Comments)    Site of swelling not noted  . Rosuvastatin Calcium Other (See Comments)    Unknown reaction    Outpatient Encounter Medications as of 04/07/2020  Medication Sig Note  . albuterol (PROVENTIL HFA;VENTOLIN HFA) 108 (90 Base) MCG/ACT inhaler Inhale 2 puffs into the lungs every 6 (six) hours as needed for wheezing or shortness of breath. (Patient not taking: Reported on 02/02/2020)   . amLODipine  (NORVASC) 5 MG tablet Take 5 mg by mouth at bedtime. 1/2 tab (Dr. Algie Coffer)   . aspirin EC 81 MG tablet Take 81 mg by mouth daily. 01/31/2020: Patient would not disclose  . b complex vitamins tablet Take 1 tablet by mouth daily with lunch.  01/31/2020: Patient would not disclose  . bisacodyl (DULCOLAX) 5 MG EC tablet Take 5 mg by mouth daily as needed for moderate constipation.  01/31/2020: Patient would not disclose  . Calcium Carb-Cholecalciferol (CALCIUM + D3 PO) Take 1 tablet by mouth at bedtime. 01/31/2020: Patient would not disclose  . cholecalciferol (VITAMIN D) 25 MCG (1000 UNIT) tablet Take 1 tablet by mouth daily.  01/31/2020: Patient would not disclose  . cilostazol (PLETAL) 100 MG tablet Take 1 tablet (100 mg total) by mouth daily.   . Evolocumab (REPATHA SURECLICK) 140 MG/ML SOAJ Inject 140 mg into the skin every 14 (fourteen) days.   . furosemide (LASIX) 20 MG tablet Take 1 tablet (20 mg total) by mouth daily.   . hydrALAZINE (APRESOLINE) 10 MG tablet Take 1 tablet (10 mg total) by mouth every 8 (eight) hours. (Patient taking differently: Take 10 mg by mouth 3 (three) times daily with meals. )   . isosorbide mononitrate (IMDUR) 60 MG 24 hr tablet Take 1 tablet (60 mg total) by mouth at bedtime.   [name] levothyroxine (SYNTHROID) 100 MCG tablet Take 100 mcg by mouth daily before breakfast. Monday - Saturday   . levothyroxine (SYNTHROID) 112 MCG tablet Take 1 tablet (112 mcg total) by mouth daily. Monday - Saturday   . Magnesium 250 MG  TABS Take 1 tablet (250 mg total) by mouth at bedtime.   . Multiple Vitamins-Minerals (EMERGEN-C IMMUNE PO) Take 1 packet by mouth daily as needed (TO BOOST THE IMMUNE SYSTEM).  (Patient not taking: Reported on 02/02/2020)   . Multiple Vitamins-Minerals (MULTIVITAMIN WITH MINERALS) tablet Take 1 tablet by mouth daily with lunch.    . nitroGLYCERIN (NITROSTAT) 0.4 MG SL tablet Place 1 tablet (0.4 mg total) under the tongue every 5 (five) minutes x 3 doses as needed for  chest pain.   [name] olmesartan (BENICAR) 40 MG tablet Take 1 tablet (40 mg total) by mouth daily.   . potassium chloride (K-DUR) 10 MEQ tablet Take 10 mEq by mouth 2 (two) times daily with breakfast and lunch.    . Semaglutide (OZEMPIC, 0.25 OR 0.5 MG/DOSE, [place]) Inject 0.25 mg into the skin every Thursday. Every other week (Dr. Algie Coffer)    No facility-administered encounter medications on file as of 04/07/2020.    Patient Active Problem List   Diagnosis Date Noted  . Hyperlipidemia 10/30/2019  . Statin myopathy 10/30/2019  . Acute bronchitis 06/06/2018  . Type 2 diabetes mellitus with stage 2 chronic kidney disease, without long-term current use of insulin (HCC) 02/17/2018  . Chronic renal disease, stage II 02/17/2018  . Benign hypertensive heart and renal disease 02/17/2018  . Primary hypothyroidism 02/17/2018  . Localized osteoarthritis of right knee 02/17/2018  . Essential hypertension 06/12/2017  . Acute coronary syndrome (HCC) 12/26/2016  . Hypertensive heart disease without heart failure 10/06/2015  . Insomnia with sleep apnea 10/06/2015  . OSA on CPAP 10/06/2015  . 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 CAD in native artery 10/06/2015  . Chest pain at rest 07/18/2015    Class: Acute  . Weakness 07/18/2015    Class: Acute  . Insomnia 05/05/2015  . Primary snoring 05/05/2015    Conditions to be addressed/monitored:HTN, DMII, CKD Stage III and Hypothyroidism, Hypercholesterolemia, CAD  Care Plan : Thyroid Disorder  Updates made by Riley Churches, RN since 04/12/2020 12:00 AM    Problem: Thyroid Disorder   Priority: High    Long-Range Goal: Manage thyroid dysfuction   Start Date: 04/07/2020  Expected End Date: 07/06/2020  This Visit's Progress: On track  Priority: High  Note:   Current Barriers:   Ineffective Self Health Maintenance  Currently UNABLE TO independently self manage needs related to chronic health conditions.   Knowledge Deficits related to short  term plan for care coordination needs and long term plans for chronic disease management needs Case Manager Clinical Goal(s):  [name] Collaboration with [name], MD regarding development and update of comprehensive plan of care as evidenced by provider attestation and co-signature . Inter-disciplinary care team collaboration (see longitudinal plan of care)  Over the next 90 days, patient will work with care management team to address care coordination and chronic disease management needs related to Disease Management  Educational Needs  Care Coordination  Medication Management and Education  Psychosocial Support   Interventions:   Reviewed and discussed current lab results for thyroid function   Determined PCP recommended a Synthroid dosage change/frequency for treatment of abnormal thyroid levels  Reviewed and discussed the following medication dosage change per PCP;  Levothyroxine (Synthroid) take 100 mcg daily before breakfast, Monday-Saturday  Determined patient verbalizes understanding of the new dosage/frequency, confirmed she picked up the new Rx from the pharmacy as directed   Re-educated on how to take Synthroid first thing in the morning 30 minutes before eating/drinking  or taking other medications, instructed to wait at least 4 hours prior to taking antiacids or vitamins   Reviewed and discussed next PCP follow up appointment scheduled for 05/26/20 @10 :30 am   Discussed plans with patient for ongoing care management follow up and provided patient with direct contact information for care management team Patient Goals/Self-Care Activities:  Over the next 90 days, patient will: -take Synthroid exactly as prescribed w/o missed doses -Call PCP and or CCM team for questions or concerns -Keep all scheduled MD follow up appointments as recommended  Follow Up Plan: Telephone follow up appointment with care management team member scheduled for: 05/30/20     Plan:Telephone  follow up appointment with care management team member scheduled for:  05/30/20  07/30/20, RN, BSN, CCM Care Management Coordinator [facility] Care Management/[facility]  Direct Phone: [phone]

## 2020-04-18 ENCOUNTER — Telehealth: Payer: Self-pay | Admitting: *Deleted

## 2020-04-18 NOTE — Chronic Care Management (AMB) (Signed)
  Care Management   Note  04/18/2020 Name: [name] MRN: [id number] DOB: Feb 15, 1946  [name] is a 75 y.o. year old female who is a primary care patient of [name], MD and is actively engaged with the care management team. I reached out to Karle Plumber by phone today to assist with scheduling a follow up visit with the Pharmacist  Follow up plan: Unsuccessful telephone outreach attempt made. A HIPAA compliant phone message was left for the patient providing contact information and requesting a return call. The care management team will reach out to the patient again over the next 7 days. If patient returns call to provider office, please advise to call Embedded Care Management Care Guide Gwenevere Ghazi at [phone].  Gwenevere Ghazi  Care Guide, Embedded Care Coordination [facility] Management

## 2020-04-25 NOTE — Chronic Care Management (AMB) (Signed)
  Care Management   Note  04/25/2020 Name: [name] MRN: [id number] DOB: January 28, 1946  [name] is a 75 y.o. year old female who is a primary care patient of [name], MD and is actively engaged with the care management team. I reached out to Karle Plumber by phone today to assist with scheduling a follow up visit with the Pharmacist.  Follow up plan: Telephone appointment with care management team member scheduled for: 05/12/2020  [facility] Guide, Embedded Care Coordination [facility]  Care Management

## 2020-04-26 ENCOUNTER — Telehealth: Payer: Self-pay

## 2020-04-26 ENCOUNTER — Telehealth: Payer: Medicare PPO

## 2020-04-26 ENCOUNTER — Other Ambulatory Visit: Payer: Self-pay | Admitting: Internal Medicine

## 2020-04-26 NOTE — Telephone Encounter (Cosign Needed)
   04/26/2020  [name] 10/16/1945 [id number]    Voice message received from patient requesting a return call. An unsuccessful telephone outreach was attempted today. The patient was referred to the case management team for assistance with care management and care coordination.   Follow Up Plan: A HIPAA compliant phone message was left for the patient providing contact information and requesting a return call. Telephone follow up appointment with care management team member scheduled for: 05/30/20  Delsa Sale, RN, BSN, CCM Care Management Coordinator [facility] Care Management/[facility]  Direct Phone: [phone]

## 2020-05-02 ENCOUNTER — Other Ambulatory Visit: Payer: Self-pay

## 2020-05-02 ENCOUNTER — Encounter: Payer: Self-pay | Admitting: Internal Medicine

## 2020-05-02 ENCOUNTER — Telehealth: Payer: Self-pay

## 2020-05-02 ENCOUNTER — Telehealth: Payer: Medicare PPO

## 2020-05-02 MED ORDER — LEVOTHYROXINE SODIUM 100 MCG PO TABS
100.0000 ug | ORAL_TABLET | Freq: Every day | ORAL | 1 refills | Status: DC
Start: 2020-05-02 — End: 2020-05-10

## 2020-05-02 NOTE — Telephone Encounter (Cosign Needed)
   05/02/2020  [name] [birth year] [id number]   Voice message received from patient stating she is returning my phone call. The patient was referred to the case management team for assistance with care management and care coordination.   Follow Up Plan: Telephone follow up appointment with care management team member scheduled for: 05/03/20  Delsa Sale, RN, BSN, CCM Care Management Coordinator [facility] Care Management/[facility]  Direct Phone: [phone]

## 2020-05-03 ENCOUNTER — Ambulatory Visit (INDEPENDENT_AMBULATORY_CARE_PROVIDER_SITE_OTHER): Payer: Medicare PPO

## 2020-05-03 DIAGNOSIS — I10 Essential (primary) hypertension: Secondary | ICD-10-CM

## 2020-05-03 DIAGNOSIS — E1122 Type 2 diabetes mellitus with diabetic chronic kidney disease: Secondary | ICD-10-CM

## 2020-05-03 DIAGNOSIS — N182 Chronic kidney disease, stage 2 (mild): Secondary | ICD-10-CM

## 2020-05-03 NOTE — Patient Instructions (Signed)
Goals we discussed today:  Goals Addressed            This Visit's Progress   . Work with SW to manage care coordination needs       Timeframe:  Long-Range Goal Priority:  Low Start Date:  2.8.22                           Expected End Date:   6.8.22                    Next planned outreach: 4.8.22  Patient Goals/Self-Care Activities . Over the next 90 days, patient will:   - Patient will self administer medications as prescribed Patient will attend all scheduled provider appointments Patient will call pharmacy for medication refills Patient will call provider office for new concerns or questions Contact SW as needed prior to next scheduled call

## 2020-05-03 NOTE — Chronic Care Management (AMB) (Signed)
Chronic Care Management    Social Work Note  05/03/2020 Name: [name] MRN: [id number] DOB: 07-18-1945  [name] is a 75 y.o. year old female who is a primary care patient of [name], MD. The CCM team was consulted to assist the patient with chronic disease management and/or care coordination needs related to: [employer] .   Engaged with patient by telephone for follow up visit in response to provider referral for social work chronic care management and care coordination services.   Consent to Services:  The patient was given information about Chronic Care Management services, agreed to services, and gave verbal consent prior to initiation of services.  Please see initial visit note for detailed documentation.   Patient agreed to services and consent obtained.   Assessment: Review of patient past medical history, allergies, medications, and health status, including review of relevant consultants reports was performed today as part of a comprehensive evaluation and provision of chronic care management and care coordination services.     SDOH (Social Determinants of Health) assessments and interventions performed:    Advanced Directives Status: Not addressed in this encounter.  CCM Care Plan  Allergies  Allergen Reactions  . Atorvastatin Swelling and Other (See Comments)    Limbs swell  . Clopidogrel Other (See Comments)    Caused bruising  . Metoprolol Other (See Comments)    Slows heart rate too low  . Metoprolol Tartrate Swelling and Other (See Comments)    Site of swelling not noted  . Pregabalin Swelling and Other (See Comments)    Site of swelling not noted  . Rosuvastatin Calcium Other (See Comments)    Unknown reaction    Outpatient Encounter Medications as of 05/03/2020  Medication Sig Note  . albuterol (PROVENTIL HFA;VENTOLIN HFA) 108 (90 Base) MCG/ACT inhaler Inhale 2 puffs into the lungs every 6 (six) hours as needed for wheezing  or shortness of breath. (Patient not taking: Reported on 02/02/2020)   . amLODipine (NORVASC) 5 MG tablet Take 5 mg by mouth at bedtime. 1/2 tab (Dr. Algie Coffer)   . aspirin EC 81 MG tablet Take 81 mg by mouth daily. 01/31/2020: Patient would not disclose  . b complex vitamins tablet Take 1 tablet by mouth daily with lunch.  01/31/2020: Patient would not disclose  . bisacodyl (DULCOLAX) 5 MG EC tablet Take 5 mg by mouth daily as needed for moderate constipation.  01/31/2020: Patient would not disclose  . Calcium Carb-Cholecalciferol (CALCIUM + D3 PO) Take 1 tablet by mouth at bedtime. 01/31/2020: Patient would not disclose  . cholecalciferol (VITAMIN D) 25 MCG (1000 UNIT) tablet Take 1 tablet by mouth daily.  01/31/2020: Patient would not disclose  . cilostazol (PLETAL) 100 MG tablet Take 1 tablet (100 mg total) by mouth daily.   . Evolocumab (REPATHA SURECLICK) 140 MG/ML SOAJ Inject 140 mg into the skin every 14 (fourteen) days.   . furosemide (LASIX) 20 MG tablet Take 1 tablet (20 mg total) by mouth daily.   . hydrALAZINE (APRESOLINE) 10 MG tablet Take 1 tablet (10 mg total) by mouth every 8 (eight) hours. (Patient taking differently: Take 10 mg by mouth 3 (three) times daily with meals. )   . isosorbide mononitrate (IMDUR) 60 MG 24 hr tablet TAKE 1 TABLET EVERY DAY   . levothyroxine (SYNTHROID) 100 MCG tablet Take 1 tablet (100 mcg total) by mouth daily before breakfast. Monday - Saturday   . levothyroxine (SYNTHROID) 112 MCG tablet Take 1 tablet (112  mcg total) by mouth daily. Monday - Saturday   . Magnesium 250 MG TABS Take 1 tablet (250 mg total) by mouth at bedtime.   . Multiple Vitamins-Minerals (EMERGEN-C IMMUNE PO) Take 1 packet by mouth daily as needed (TO BOOST THE IMMUNE SYSTEM).  (Patient not taking: Reported on 02/02/2020)   . Multiple Vitamins-Minerals (MULTIVITAMIN WITH MINERALS) tablet Take 1 tablet by mouth daily with lunch.    . nitroGLYCERIN (NITROSTAT) 0.4 MG SL tablet Place 1 tablet  (0.4 mg total) under the tongue every 5 (five) minutes x 3 doses as needed for chest pain.   [name] olmesartan (BENICAR) 40 MG tablet Take 1 tablet (40 mg total) by mouth daily.   . potassium chloride (K-DUR) 10 MEQ tablet Take 10 mEq by mouth 2 (two) times daily with breakfast and lunch.    . Semaglutide (OZEMPIC, 0.25 OR 0.5 MG/DOSE, [place]) Inject 0.25 mg into the skin every Thursday. Every other week (Dr. Algie Coffer)    No facility-administered encounter medications on file as of 05/03/2020.    Patient Active Problem List   Diagnosis Date Noted  . Hyperlipidemia 10/30/2019  . Statin myopathy 10/30/2019  . Acute bronchitis 06/06/2018  . Type 2 diabetes mellitus with stage 2 chronic kidney disease, without long-term current use of insulin (HCC) 02/17/2018  . Chronic renal disease, stage II 02/17/2018  . Benign hypertensive heart and renal disease 02/17/2018  . Primary hypothyroidism 02/17/2018  . Localized osteoarthritis of right knee 02/17/2018  . Essential hypertension 06/12/2017  . Acute coronary syndrome (HCC) 12/26/2016  . Hypertensive heart disease without heart failure 10/06/2015  . Insomnia with sleep apnea 10/06/2015  . OSA on CPAP 10/06/2015  . 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 CAD in native artery 10/06/2015  . Chest pain at rest 07/18/2015    Class: Acute  . Weakness 07/18/2015    Class: Acute  . Insomnia 05/05/2015  . Primary snoring 05/05/2015    Conditions to be addressed/monitored: HTN, DMII and CKD Stage II; Care Coordination  Care Plan : Social Work [facility] Care Plan  Updates made by [name] since 05/03/2020 12:00 AM    Problem: Barriers to Treatment     Long-Range Goal: Barriers to Treatment Identified and Managed   Start Date: 05/03/2020  Expected End Date: 08/31/2020  Priority: Low  Note:   Current Barriers:  . Chronic disease management support and education needs related to HTN, DM, and CKD Stage II    Social Worker Clinical Goal(s):  [name] Over the  next 120 days, patient will work with SW to identify and address any acute and/or chronic care coordination needs related to the self health management of HTN, DM, and CKD Stage II    CCM SW Interventions:  . Inter-disciplinary care team collaboration (see longitudinal plan of care) . Collaboration with [name], MD regarding development and update of comprehensive plan of care as evidenced by provider attestation and co-signature . Successful outbound call placed to the patient to assist with care coordination needs . Determined the patient is currently out of Repatha and was supposed to administer on 2/7 . Discussed the patient has been in contact with Dr. Marylene Buerger office and was informed by [facility] the prescription would be ready for pick up on 2.9.22 . Encouraged the patient to contact SW if barriers remain - provided patient with SW contact number . Discussed long term follow up with SW while patient remains actively involved with  RN Case Manager  to address care management  needs . Scheduled follow up call over the next 90 days Patient Goals/Self-Care Activities . Over the next 90 days, patient will:   - Patient will self administer medications as prescribed Patient will attend all scheduled provider appointments Patient will call pharmacy for medication refills Patient will call provider office for new concerns or questions Contact SW as needed prior to next scheduled call Follow Up Plan:  SW will follow up with the patient over the next 90 days       Follow Up Plan: SW will follow up with patient by phone over the next 90 days      [name], BSW, CDP Social Worker, Certified Dementia Practitioner [facility] Care Management [phone]  Total time spent performing care coordination and/or care management activities with the patient by phone or face to face = 21 minutes.

## 2020-05-04 ENCOUNTER — Telehealth: Payer: Self-pay

## 2020-05-04 NOTE — Telephone Encounter (Signed)
Pt notified that ozempic is ready for pick up

## 2020-05-10 ENCOUNTER — Other Ambulatory Visit: Payer: Self-pay

## 2020-05-10 MED ORDER — LEVOTHYROXINE SODIUM 100 MCG PO TABS: 100.0000 ug | ORAL_TABLET | Freq: Every day | ORAL | 1 refills | Status: DC

## 2020-05-11 ENCOUNTER — Telehealth: Payer: Self-pay

## 2020-05-11 NOTE — Progress Notes (Signed)
05/11/20-Patient informed of CCM telephone appointment 05/12/20 at 1:00 pm with [name], CPP. Made the patient aware to have medications and supplements near during phone call. The patient voiced understanding.   [name], CPP notified.  Suezanne Cheshire, CMA Clinical Pharmacist Assistant [phone] CCM Total Time: 5 minutes

## 2020-05-12 ENCOUNTER — Ambulatory Visit: Payer: Medicare PPO

## 2020-05-12 DIAGNOSIS — E7800 Pure hypercholesterolemia, unspecified: Secondary | ICD-10-CM

## 2020-05-12 DIAGNOSIS — I10 Essential (primary) hypertension: Secondary | ICD-10-CM

## 2020-05-12 DIAGNOSIS — N182 Chronic kidney disease, stage 2 (mild): Secondary | ICD-10-CM

## 2020-05-12 DIAGNOSIS — E1122 Type 2 diabetes mellitus with diabetic chronic kidney disease: Secondary | ICD-10-CM | POA: Diagnosis not present

## 2020-05-12 NOTE — Progress Notes (Signed)
Chronic Care Management Pharmacy Note  05/22/2020 Name:  [name] MRN:  [id number] DOB:  [birth year]  Subjective: [name] is an 75 y.o. year old female who is a primary patient of Glendale Chard, MD.  The CCM team was consulted for assistance with disease management and care coordination needs.    Engaged with patient by telephone for follow up visit in response to provider referral for pharmacy case management and/or care coordination services.   Consent to Services:  The patient was given information about Chronic Care Management services, agreed to services, and gave verbal consent prior to initiation of services.  Please see initial visit note for detailed documentation.   Patient Care Team: Glendale Chard, MD as PCP - General (Internal Medicine) Marylynn Ximenna Fonseca, MD as Consulting Physician (Ophthalmology) Daneen Schick as Social Worker [place], Claudette Stapler, RN as Case Manager Mayford Knife, [facility] (Pharmacist)  Recent office visits: 02/02/2020  Recent consult visits: 04/05/2020 Cardiology OV: Changed to 1/2 tablet daily   Hospital visits:   Objective:  Lab Results  Component Value Date   CREATININE 1.11 (H) 01/31/2020   BUN 10 01/31/2020   GFRNONAA 52 (L) 01/31/2020   GFRAA 55 (L) 09/30/2019   NA 139 01/31/2020   K 4.4 01/31/2020   CALCIUM 10.2 01/31/2020   CO2 24 01/31/2020    Lab Results  Component Value Date/Time   HGBA1C 5.7 (H) 02/02/2020 11:55 AM   HGBA1C 5.7 (H) 09/30/2019 10:48 AM   MICROALBUR 10 05/27/2019 11:30 AM   MICROALBUR 30 05/21/2018 10:56 AM    Last diabetic Eye exam:  Lab Results  Component Value Date/Time   HMDIABEYEEXA No Retinopathy 02/27/2019 12:00 AM    Last diabetic Foot exam: No results found for: HMDIABFOOTEX   Lab Results  Component Value Date   CHOL 141 02/02/2020   HDL 67 02/02/2020   LDLCALC 62 02/02/2020   TRIG 54 02/02/2020   CHOLHDL 2.1 02/02/2020    Hepatic Function Latest Ref Rng & Units  09/30/2019 07/13/2019 06/02/2019  Total Protein 6.0 - 8.5 g/dL 6.4 6.3(L) 6.6  Albumin 3.7 - 4.7 g/dL 4.6 4.1 4.5  AST 0 - 40 IU/L 37 43(H) 38  ALT 0 - 32 IU/L 14 22 17   Alk Phosphatase 48 - 121 IU/L 48 45 58  Total Bilirubin 0.0 - 1.2 mg/dL 0.4 0.5 0.3    Lab Results  Component Value Date/Time   TSH 0.205 (L) 04/04/2020 10:41 AM   TSH 7.470 (H) 02/23/2020 10:02 AM   FREET4 2.13 (H) 04/04/2020 10:41 AM   FREET4 2.01 (H) 02/02/2020 11:55 AM    CBC Latest Ref Rng & Units 01/31/2020 07/14/2019 07/13/2019  WBC 4.0 - 10.5 K/uL 3.2(L) 4.3 3.9(L)  Hemoglobin 12.0 - 15.0 g/dL 12.1 11.4(L) 12.3  Hematocrit 36.0 - 46.0 % 36.3 34.9(L) 37.8  Platelets 150 - 400 K/uL 242 226 273    Lab Results  Component Value Date/Time   VD25OH 44.9 06/02/2019 09:18 AM    Clinical ASCVD: Yes  The 10-year ASCVD risk score Mikey Bussing DC Jr., et al., 2013) is: 22.4%   Values used to calculate the score:     Age: 82 years     Sex: Female     Is Non-Hispanic African American: Yes     Diabetic: Yes     Tobacco smoker: No     Systolic Blood Pressure: 549 mmHg     Is BP treated: Yes     HDL Cholesterol: 67 mg/dL  Total Cholesterol: 141 mg/dL    Depression screen [facility] 2/9 09/30/2019 05/27/2019 02/12/2019  Decreased Interest 0 0 0  Down, Depressed, Hopeless 0 0 0  PHQ - 2 Score 0 0 0  Altered sleeping 0 1 1  Tired, decreased energy 0 0 0  Change in appetite 0 0 3  Feeling bad or failure about yourself  0 0 0  Trouble concentrating 0 0 0  Moving slowly or fidgety/restless 0 0 0  Suicidal thoughts 0 0 0  PHQ-9 Score 0 1 4  Difficult doing work/chores - Not difficult at all Not difficult at all      Social History   Tobacco Use  Smoking Status Never Smoker  Smokeless Tobacco Never Used   BP Readings from Last 3 Encounters:  02/02/20 112/70  01/31/20 (!) 156/69  01/04/20 110/72   Pulse Readings from Last 3 Encounters:  02/02/20 80  01/31/20 65  09/30/19 60   Wt Readings from Last 3 Encounters:   02/02/20 140 lb 6.4 oz (63.7 kg)  01/04/20 149 lb (67.6 kg)  09/30/19 151 lb 3.2 oz (68.6 kg)    Assessment/Interventions: Review of patient past medical history, allergies, medications, health status, including review of consultants reports, laboratory and other test data, was performed as part of comprehensive evaluation and provision of chronic care management services.   SDOH:  (Social Determinants of Health) assessments and interventions performed: No   CCM Care Plan  Allergies  Allergen Reactions  . Atorvastatin Swelling and Other (See Comments)    Limbs swell  . Clopidogrel Other (See Comments)    Caused bruising  . Metoprolol Other (See Comments)    Slows heart rate too low  . Metoprolol Tartrate Swelling and Other (See Comments)    Site of swelling not noted  . Pregabalin Swelling and Other (See Comments)    Site of swelling not noted  . Rosuvastatin Calcium Other (See Comments)    Unknown reaction    Medications Reviewed Today    Reviewed by Mayford Knife, RPH (Pharmacist) on 05/12/20 at 1453  Med List Status: <None>  Medication Order Taking? Sig Documenting Provider Last Dose Status Informant  albuterol (PROVENTIL HFA;VENTOLIN HFA) 108 (90 Base) MCG/ACT inhaler [id number] No Inhale 2 puffs into the lungs every 6 (six) hours as needed for wheezing or shortness of breath.  Patient not taking: No sig reported   Minette Brine, FNP Not Taking Active            Med Note Barbie Banner, [place]   Tue Feb 02, 2020 10:52 AM)    amLODipine (NORVASC) 5 MG tablet [id number] Yes Take 5 mg by mouth at bedtime. 1/2 tab (Dr. Doylene Canard) [provider] Taking Active Self  aspirin EC 81 MG tablet [id number] Yes Take 81 mg by mouth daily. [provider] Taking Active            Med Note Duffy Bruce, Sherrie Mustache Jan 31, 2020 12:41 PM) Patient would not disclose  b complex vitamins tablet [id number] Yes Take 1 tablet by mouth daily with lunch.  [provider] Taking  Active            Med Note Duffy Bruce, Sherrie Mustache Jan 31, 2020 12:42 PM) Patient would not disclose  bisacodyl (DULCOLAX) 5 MG EC tablet [id number] Yes Take 5 mg by mouth daily as needed for moderate constipation.  [provider] Taking Active  Med Note Duffy Bruce, Sherrie Mustache Jan 31, 2020 12:43 PM) Patient would not disclose  Calcium Carb-Cholecalciferol (CALCIUM + D3 PO) [id number] Yes Take 1 tablet by mouth at bedtime. [provider] Taking Active            Med Note Duffy Bruce, Sherrie Mustache Jan 31, 2020 12:42 PM) Patient would not disclose  cholecalciferol (VITAMIN D) 25 MCG (1000 UNIT) tablet [id number] Yes Take 1 tablet by mouth daily.  [provider] Taking Active            Med Note Duffy Bruce, Sherrie Mustache Jan 31, 2020 12:42 PM) Patient would not disclose  cilostazol (PLETAL) 100 MG tablet [id number] Yes Take 1 tablet (100 mg total) by mouth daily. Geralynn Rile, MD Taking Active Self  Evolocumab [facility] SURECLICK) 433 MG/ML Darden Palmer [id number] Yes Inject 140 mg into the skin every 14 (fourteen) days. Geralynn Rile, MD Taking Active Self  furosemide (LASIX) 20 MG tablet [id number] Yes Take 1 tablet (20 mg total) by mouth daily. Glendale Chard, MD Taking Active Self  hydrALAZINE (APRESOLINE) 10 MG tablet [id number] Yes Take 1 tablet (10 mg total) by mouth every 8 (eight) hours.  Patient taking differently: Take 10 mg by mouth 3 (three) times daily with meals.   Dixie Dials, MD Taking Active Self  isosorbide mononitrate (IMDUR) 60 MG 24 hr tablet [id number] Yes TAKE 1 TABLET EVERY DAY Glendale Chard, MD Taking Active   levothyroxine (SYNTHROID) 100 MCG tablet [id number] Yes Take 1 tablet (100 mcg total) by mouth daily before breakfast. Monday - Saturday Glendale Chard, MD Taking Active   levothyroxine (SYNTHROID) 112 MCG tablet [id number] No Take 1 tablet (112 mcg total) by mouth daily. Monday - Saturday  Patient not taking: Reported on 05/12/2020   Glendale Chard, MD Not Taking Active   Magnesium 250 MG TABS [id number] Yes Take 1 tablet (250 mg total) by mouth at bedtime. Glendale Chard, MD Taking Active Self  Multiple Vitamins-Minerals (EMERGEN-C IMMUNE PO) [id number] Yes Take 1 packet by mouth daily as needed (TO BOOST THE IMMUNE SYSTEM). [provider] Taking Active Self  Multiple Vitamins-Minerals (MULTIVITAMIN WITH MINERALS) tablet [id number] Yes Take 1 tablet by mouth daily with lunch.  [provider] Taking Active Self  nitroGLYCERIN (NITROSTAT) 0.4 MG SL tablet [id number] Yes Place 1 tablet (0.4 mg total) under the tongue every 5 (five) minutes x 3 doses as needed for chest pain. Glendale Chard, MD Taking Active   olmesartan [facility]) 40 MG tablet [id number] Yes Take 1 tablet (40 mg total) by mouth daily. Glendale Chard, MD Taking Active Self  potassium chloride (K-DUR) 10 MEQ tablet [id number] Yes Take 10 mEq by mouth 2 (two) times daily with breakfast and lunch.  [provider] Taking Active Self  Semaglutide (OZEMPIC, 0.25 OR 0.5 MG/DOSE, [place]) [id number] Yes Inject 0.25 mg into the skin every Thursday. Every other week (Dr. Doylene Canard) [provider] Taking Active Self          Patient Active Problem List   Diagnosis Date Noted  . Hyperlipidemia 10/30/2019  . Statin myopathy 10/30/2019  . Acute bronchitis 06/06/2018  . Type 2 diabetes mellitus with stage 2 chronic kidney disease, without long-term current use of insulin ([place]) 02/17/2018  . Chronic renal disease, stage II 02/17/2018  . Benign hypertensive heart and renal disease 02/17/2018  . Primary hypothyroidism 02/17/2018  . Localized osteoarthritis of right knee 02/17/2018  . Essential hypertension 06/12/2017  .  Acute coronary syndrome ([place]) 12/26/2016  . Hypertensive heart disease without heart failure 10/06/2015  . Insomnia with sleep apnea 10/06/2015  . OSA on CPAP 10/06/2015  . 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 CAD in native artery  10/06/2015  . Chest pain at rest 07/18/2015    Class: Acute  . Weakness 07/18/2015    Class: Acute  . Insomnia 05/05/2015  . Primary snoring 05/05/2015    Immunization History  Administered Date(s) Administered  . Influenza, High Dose Seasonal PF 03/30/2014, 01/31/2015, 04/04/2016, 12/18/2016, 11/13/2018, 01/04/2020  . Influenza-Unspecified 01/24/2013, 12/21/2017  . Moderna SARS-COV2 Booster Vaccination 02/05/2020  . Moderna Sars-Covid-2 Vaccination 05/13/2019, 06/10/2019  . Pneumococcal Polysaccharide-23 12/27/2016  . Zoster 02/03/2013    Conditions to be addressed/monitored:  Hypertension, Hyperlipidemia and Angina  Care Plan : [place]  Updates made by Mayford Knife, RPH since 05/22/2020 12:00 AM    Problem: HTN, Angina, HLD, Diabetes   Priority: High  Onset Date: 05/12/2020    Long-Range Goal: Disease Management   Start Date: 05/12/2020  This Visit's Progress: On track  Priority: High  Note:   Current Barriers:  . Unable to maintain control of Hypertension  Pharmacist Clinical Goal(s):  [name] Over the next 90 days, patient will maintain control of hypertension as evidenced by BP readings  through collaboration with PharmD and provider.    Interventions: . 1:1 collaboration with Glendale Chard, MD regarding development and update of comprehensive plan of care as evidenced by provider attestation and co-signature . Inter-disciplinary care team collaboration (see longitudinal plan of care) . Comprehensive medication review performed; medication list updated in electronic medical record  Hypertension (BP goal <130/80) -controlled -Current treatment: . Amlodipine 5 mg - take 1/2 tablet daily  . Hydralazine 10 mg tablet take 1 tablet by mouth every 8 hours  . Isosorbide mononitrate 60 mg taking 1 tablet by mouth daily . Olmesartan 40 mg tablet daily -Current home readings: 148-102/74-62, patient reports BP has been mostly high and she is uncertain of what  the readings should be.  -Current dietary habits: will discuss at next visit -Denies hypotensive/hypertensive symptoms -Educated on BP goals and benefits of medications for prevention of heart attack, stroke and kidney damage; Daily salt intake goal < 2300 mg; Exercise goal of 150 minutes per week; Importance of home blood pressure monitoring; Proper BP monitoring technique; -Counseled to monitor BP at home, document, and provide log at future appointments -Counseled on diet and exercise extensively Recommended to continue current medication  * once in the past month, and Dr. Doylene Canard was made aware.   Angina  -controlled -Current treatment  . Isosorbide Mononitrate 60 mg at bedtime . Nitroglycerin 0.4 mg prn  -Recommended to continue current medication    Hyperlipidemia: (LDL goal < 70) -controlled -Current treatment: . Cilostazol 100 mg tablet once daily  . Repatha Sureclick 514 mg/ mL - inject 140 mg into the skin every 14 days.    -Educated on Cholesterol goals;  Benefits of statin for ASCVD risk reduction; Importance of limiting foods high in cholesterol; Exercise goal of 150 minutes per week; -Counseled on diet and exercise extensively Recommended to continue current medication  Diabetes (A1c goal <7%) -controlled -Current medications: . Ozempic 0.25 mg every other week   -Current home glucose readings . fasting glucose: 98, 87,116,111 99,87,103,101,105,96,94,97, 100,103, 112, 89,87  -Denies hypoglycemic/hyperglycemic symptoms -Current meal patterns:  . breakfast: cup of coffee, or a small container of yogurt, scrambled egg, 1/2 cup of grits  with margarine, 2 slices of bacon  . lunch: nothing  . dinner: saute spinach, cabbage or broccoli - patient reports that she does not eat them often, she has tri  -Educated onA1c and blood sugar goals; Exercise goal of 150 minutes per week; -Counseled to check feet daily and get yearly eye exams -Recommended that patient  be onboarded to the pharmacy.    Hypothyroidism  -controlled -Current treatment  . Levothyroxine 100 mcg taking 1 tablet by Monday through Saturday  -Recommended to continue current medication  Patient Goals/Self-Care Activities . Over the next 90 days, patient will:  - take medications as prescribed  Follow Up Plan: The patient has been provided with contact information for the care management team and has been advised to call with any health related questions or concerns.       Medication Assistance: None required.  Patient affirms current coverage meets needs.  Patient's preferred pharmacy is:  [facility], [place] [address] Phone: [phone] Fax: [phone]  [facility] [address], [address] [address] Phone: [phone] Fax: [phone]  Uses pill box? Yes Pt endorses 100% compliance  We discussed: Current pharmacy is preferred with insurance plan and patient is satisfied with pharmacy services Patient decided to: Continue current medication management strategy  Care Plan and Follow Up Patient Decision:  Patient agrees to Care Plan and Follow-up.  Plan: The patient has been provided with contact information for the care management team and has been advised to call with any health related questions or concerns.    Orlando Penner, PharmD Clinical Pharmacist [facility] [phone]

## 2020-05-17 IMAGING — CT CT L SPINE W/O CM
3 of 4 series · 12 of 35 positions shown, 14 images · non-contrast
Comparison: 01/13/2009

CLINICAL DATA: Lumbar disc disease. Difficulty raising the right
leg

EXAM:
CT LUMBAR SPINE WITHOUT CONTRAST
TECHNIQUE: Multidetector CT imaging of the lumbar spine was performed without
intravenous contrast administration. Multiplanar CT image
reconstructions were also generated.

[Series 3: l-spine 2.00 br40 s3 lspine st · axial · 0.33mm/px · z∈[+1385,+1529]mm · 4 of 109 slices shown, 5 images]
[im 19/109  soft-tissue]
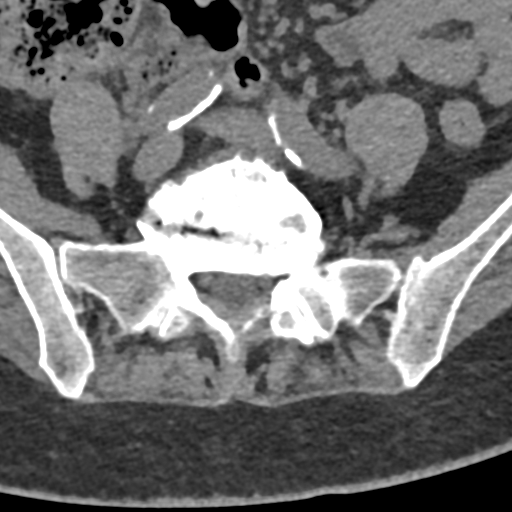
[im 19/109  bone]
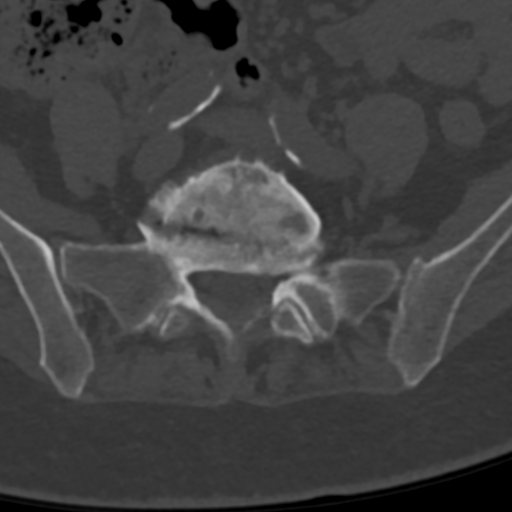
[im 37/109  bone]
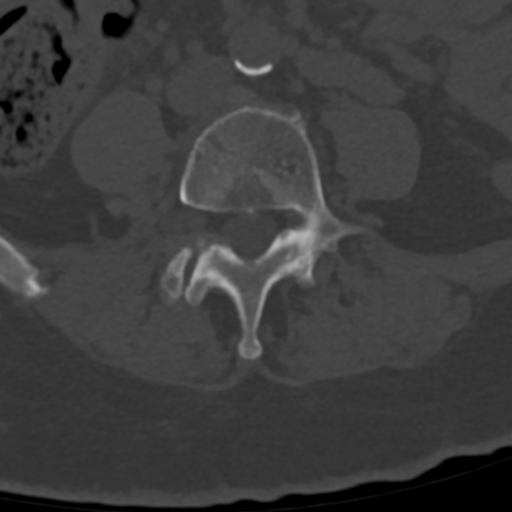
[im 73/109  bone]
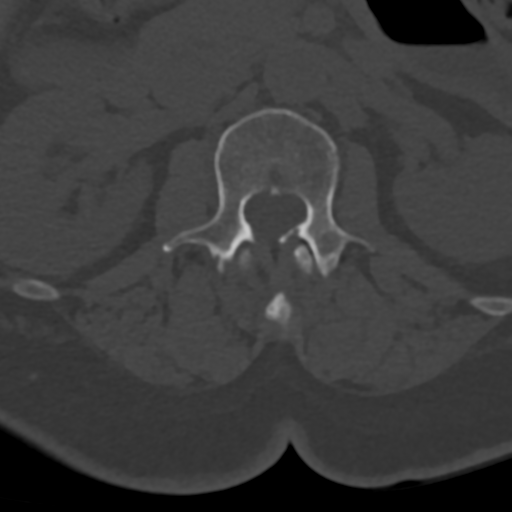
[im 91/109  bone]
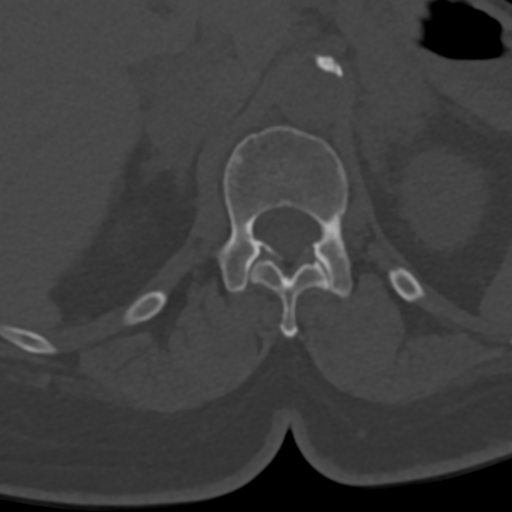

[Series 5: l-spine 2.00 br60 s3 sag sag bone · sagittal · 0.29mm/px · 5 of 75 slices shown, 6 images]
[im 25/75  bone]
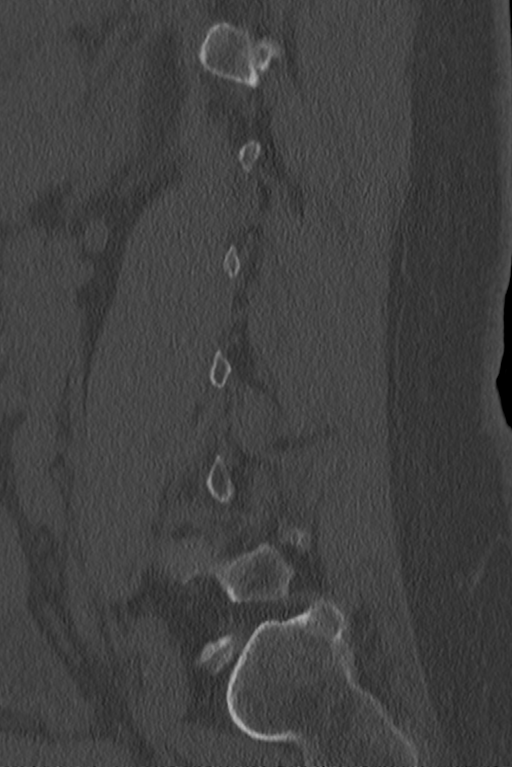
[im 31/75  bone]
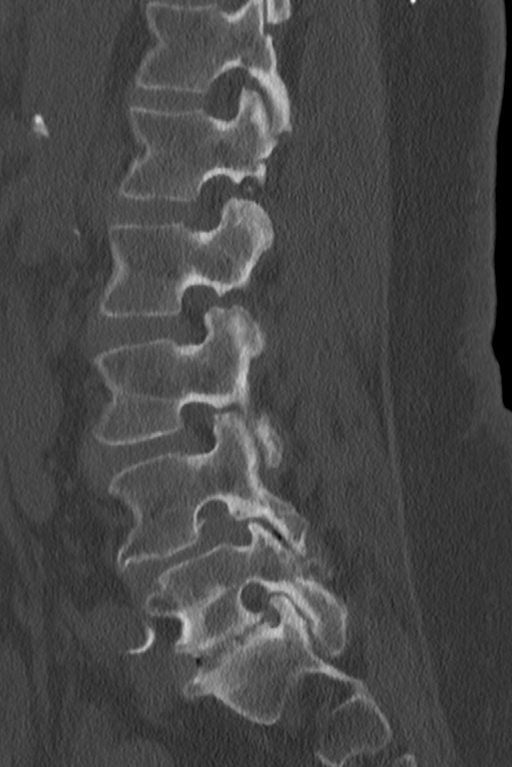
[im 38/75  soft-tissue]
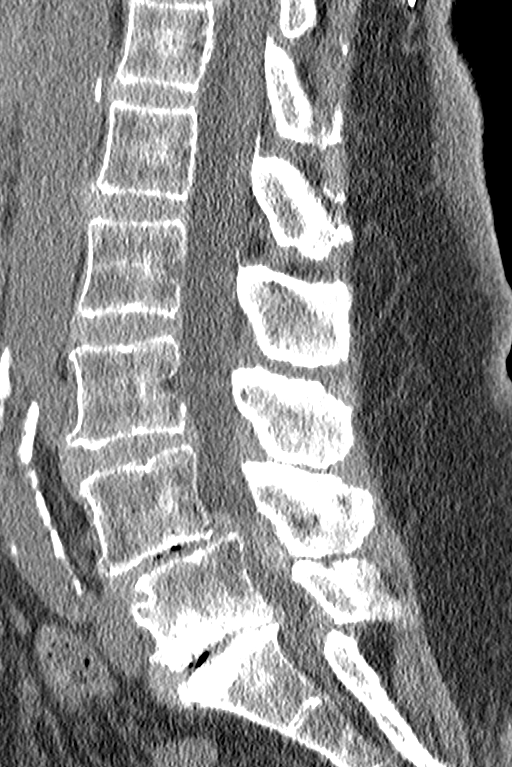
[im 38/75  bone]
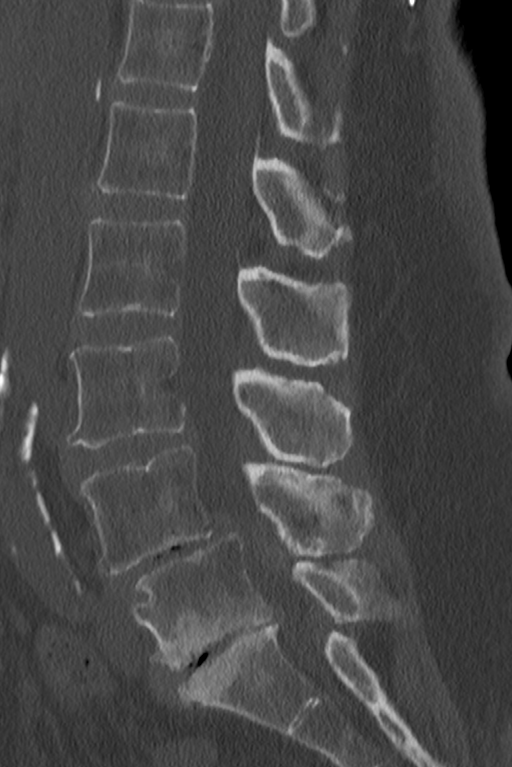
[im 44/75  bone]
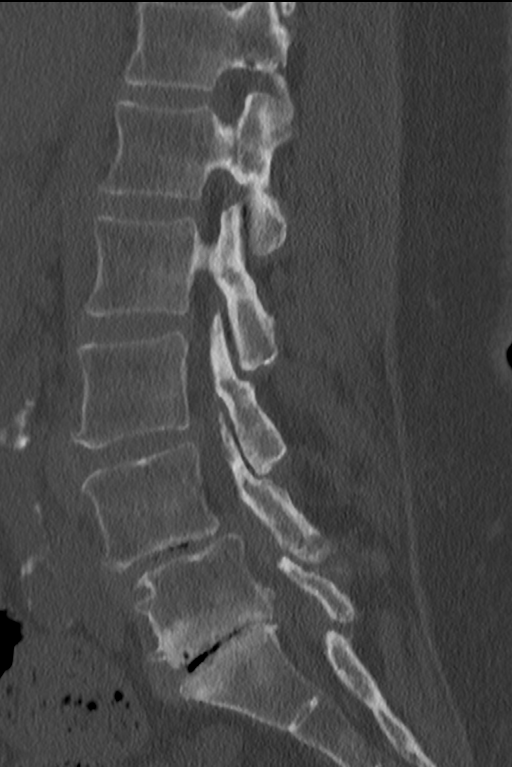
[im 50/75  bone]
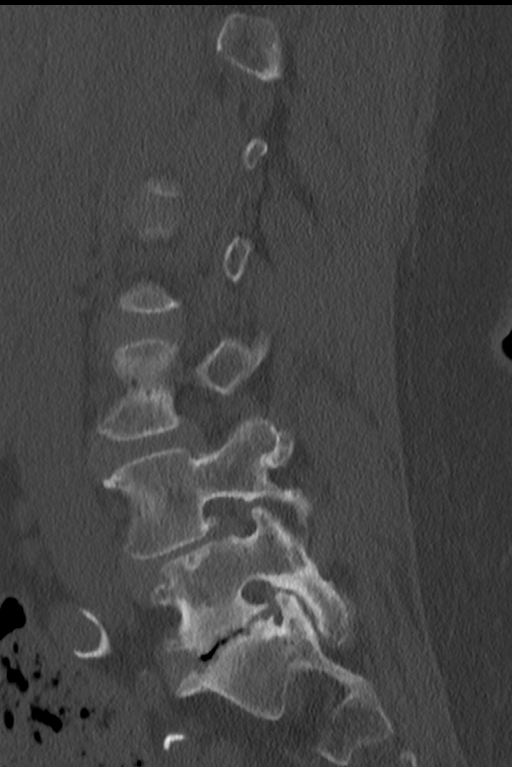

[Series 7: l-spine 2.00 br60 s3 cor cor bone · coronal · 0.28mm/px · 3 of 83 slices shown]
[im 17/83  bone]
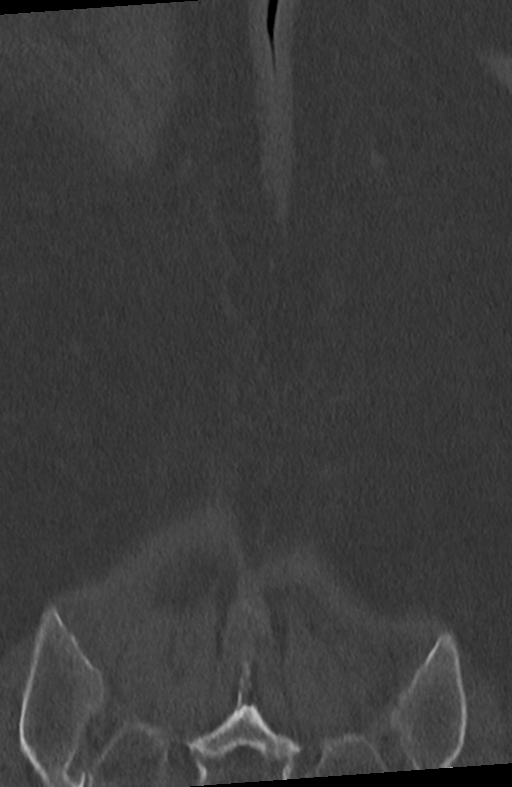
[im 33/83  bone]
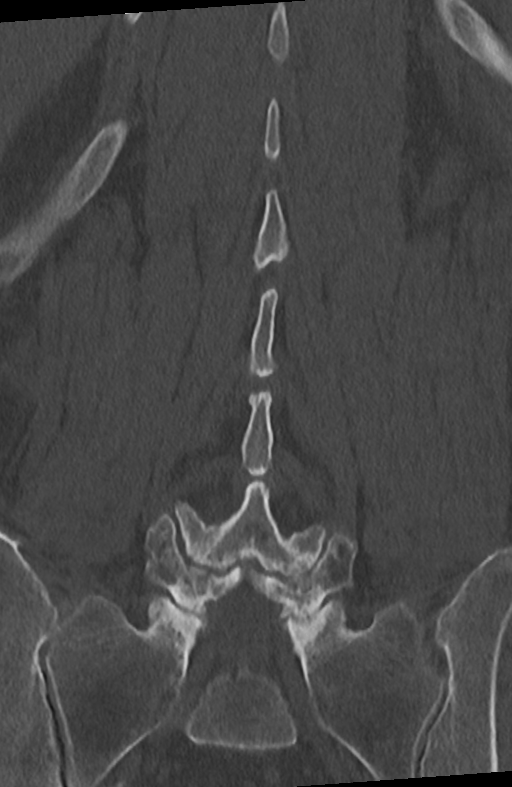
[im 50/83  bone]
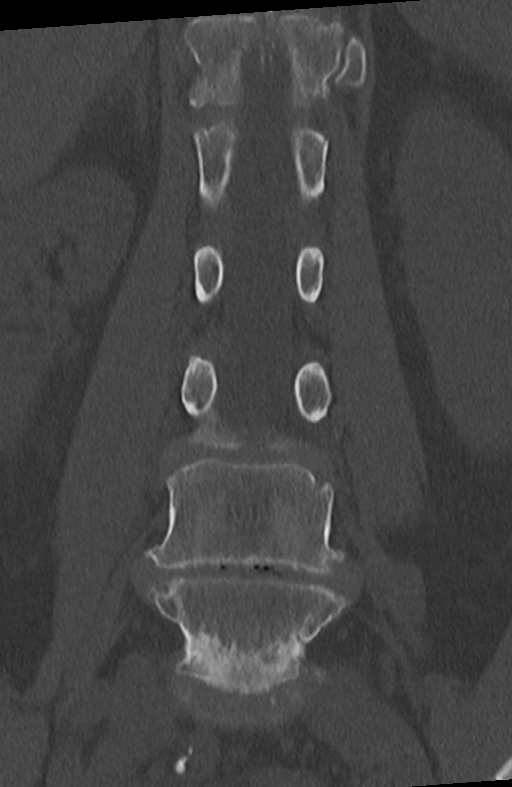

[12 of 35 positions shown; findings below may reference images not displayed]

FINDINGS: Segmentation: 5 lumbar type vertebrae.

Alignment: Grade 1 anterolisthesis of L4 on L5 secondary to facet
disease.

Vertebrae: No acute fracture or focal pathologic process.

Other: Mild osteoarthritis of bilateral sacroiliac joints.

Paraspinal and other soft tissues: No acute paraspinal abnormality.
Abdominal aortic atherosclerosis.

Disc levels: Severe degenerative disc disease with disc height loss
at L5-S1 with endplate sclerosis. Degenerative disc disease with
disc height loss at L4-5.

T12-L1: No disc protrusion, foraminal stenosis or central canal
stenosis.

L1-L2: No disc protrusion, foraminal stenosis or central canal
stenosis. Mild bilateral facet arthropathy.

L2-L3: Broad-based disc bulge. Mild bilateral facet arthropathy. No
foraminal or central canal stenosis.

L3-L4: Broad-based disc bulge. Moderate bilateral facet arthropathy.
Mild right foraminal stenosis. No left foraminal stenosis. Mild
spinal stenosis. Bilateral lateral recess stenosis.

L4-L5: Broad-based disc bulge. Severe bilateral facet arthropathy.
Severe spinal stenosis and bilateral lateral recess stenosis. Severe
left and moderate right foraminal stenosis.

L5-S1: Broad-based disc osteophyte complex. Moderate bilateral facet
arthropathy. Mild bilateral foraminal stenosis.
IMPRESSION: 1. Diffuse lumbar spine spondylosis as described above.
2.  No acute osseous injury of the lumbar spine.
3.  Aortic Atherosclerosis ([id number]).

## 2020-05-22 NOTE — Patient Instructions (Addendum)
Visit Information It was great speaking with you today!  Please let me know if you have any questions about our visit.  Goals Addressed            This Visit's Progress   . Manage My Medicine       Timeframe:  Long-Range Goal Priority:  High Start Date:                             Expected End Date:                       Follow Up Date 08/09/2020   - call for medicine refill 2 or 3 days before it runs out - call if I am sick and can't take my medicine - use a pillbox to sort medicine    Why is this important?   . These steps will help you keep on track with your medicines.          Patient Care Plan: Thyroid Disorder    Problem Identified: Thyroid Disorder   Priority: High    Long-Range Goal: Manage thyroid dysfuction   Start Date: 04/07/2020  Expected End Date: 07/06/2020  This Visit's Progress: On track  Priority: High  Note:   Current Barriers:   Ineffective Self Health Maintenance  Currently UNABLE TO independently self manage needs related to chronic health conditions.   Knowledge Deficits related to short term plan for care coordination needs and long term plans for chronic disease management needs Case Manager Clinical Goal(s):  [name] Collaboration with [name], MD regarding development and update of comprehensive plan of care as evidenced by provider attestation and co-signature . Inter-disciplinary care team collaboration (see longitudinal plan of care)  Over the next 90 days, patient will work with care management team to address care coordination and chronic disease management needs related to Disease Management  Educational Needs  Care Coordination  Medication Management and Education  Psychosocial Support   Interventions:   Reviewed and discussed current lab results for thyroid function   Determined PCP recommended a Synthroid dosage change/frequency for treatment of abnormal thyroid levels  Reviewed and discussed the following medication  dosage change per PCP;  Levothyroxine (Synthroid) take 100 mcg daily before breakfast, Monday-Saturday  Determined patient verbalizes understanding of the new dosage/frequency, confirmed she picked up the new Rx from the pharmacy as directed   Re-educated on how to take Synthroid first thing in the morning 30 minutes before eating/drinking or taking other medications, instructed to wait at least 4 hours prior to taking antiacids or vitamins   Reviewed and discussed next PCP follow up appointment scheduled for 05/26/20 @10 :30 am   Discussed plans with patient for ongoing care management follow up and provided patient with direct contact information for care management team Patient Goals/Self-Care Activities:  Over the next 90 days, patient will: -take Synthroid exactly as prescribed w/o missed doses -Call PCP and or CCM team for questions or concerns -Keep all scheduled MD follow up appointments as recommended  Follow Up Plan: Telephone follow up appointment with care management team member scheduled for: 05/30/20   Patient Care Plan: Social Work [facility] Care Plan    Problem Identified: Barriers to Treatment     Long-Range Goal: Barriers to Treatment Identified and Managed   Start Date: 05/03/2020  Expected End Date: 08/31/2020  Priority: Low  Note:   Current Barriers:  . Chronic disease management support  and education needs related to HTN, DM, and CKD Stage II    Social Worker Clinical Goal(s):  [name] Over the next 120 days, patient will work with SW to identify and address any acute and/or chronic care coordination needs related to the self health management of HTN, DM, and CKD Stage II    CCM SW Interventions:  . Inter-disciplinary care team collaboration (see longitudinal plan of care) . Collaboration with [name], MD regarding development and update of comprehensive plan of care as evidenced by provider attestation and co-signature . Successful outbound call placed to the patient to  assist with care coordination needs . Determined the patient is currently out of Repatha and was supposed to administer on 2/7 . Discussed the patient has been in contact with Dr. Marylene Buerger office and was informed by [facility] the prescription would be ready for pick up on 2.9.22 . Encouraged the patient to contact SW if barriers remain - provided patient with SW contact number . Discussed long term follow up with SW while patient remains actively involved with  RN Case Manager  to address care management needs . Scheduled follow up call over the next 90 days Patient Goals/Self-Care Activities . Over the next 90 days, patient will:   - Patient will self administer medications as prescribed Patient will attend all scheduled provider appointments Patient will call pharmacy for medication refills Patient will call provider office for new concerns or questions Contact SW as needed prior to next scheduled call Follow Up Plan:  SW will follow up with the patient over the next 90 days    Patient Care Plan: CCM Pharmacy Care Plan    Problem Identified: HTN, Angina, HLD, Diabetes   Priority: High  Onset Date: 05/12/2020    Long-Range Goal: Disease Management   Start Date: 05/12/2020  This Visit's Progress: On track  Priority: High  Note:   Current Barriers:  . Unable to maintain control of Hypertension  Pharmacist Clinical Goal(s):  [name] Over the next 90 days, patient will maintain control of hypertension as evidenced by BP readings  through collaboration with PharmD and provider.    Interventions: . 1:1 collaboration with [name], MD regarding development and update of comprehensive plan of care as evidenced by provider attestation and co-signature . Inter-disciplinary care team collaboration (see longitudinal plan of care) . Comprehensive medication review performed; medication list updated in electronic medical record  Hypertension (BP goal <130/80) -controlled -Current  treatment: . Amlodipine 5 mg - take 1/2 tablet daily  . Hydralazine 10 mg tablet take 1 tablet by mouth every 8 hours  . Isosorbide mononitrate 60 mg taking 1 tablet by mouth daily . Olmesartan 40 mg tablet daily -Current home readings: 148-102/74-62, patient reports BP has been mostly high and she is uncertain of what the readings should be.  -Current dietary habits: will discuss at next visit -Denies hypotensive/hypertensive symptoms -Educated on BP goals and benefits of medications for prevention of heart attack, stroke and kidney damage; Daily salt intake goal < 2300 mg; Exercise goal of 150 minutes per week; Importance of home blood pressure monitoring; Proper BP monitoring technique; -Counseled to monitor BP at home, document, and provide log at future appointments -Counseled on diet and exercise extensively Recommended to continue current medication  * once in the past month, and Dr. Algie Coffer was made aware.   Angina  -controlled -Current treatment  . Isosorbide Mononitrate 60 mg at bedtime . Nitroglycerin 0.4 mg prn  -Recommended to continue current medication  Hyperlipidemia: (LDL goal < 70) -controlled -Current treatment: . Cilostazol 100 mg tablet once daily  . Repatha Sureclick 140 mg/ mL - inject 140 mg into the skin every 14 days.    -Educated on Cholesterol goals;  Benefits of statin for ASCVD risk reduction; Importance of limiting foods high in cholesterol; Exercise goal of 150 minutes per week; -Counseled on diet and exercise extensively Recommended to continue current medication  Diabetes (A1c goal <7%) -controlled -Current medications: . Ozempic 0.25 mg every other week   -Current home glucose readings . fasting glucose: 98, 87,116,111 99,87,103,101,105,96,94,97, 100,103, 112, 89,87  -Denies hypoglycemic/hyperglycemic symptoms -Current meal patterns:  . breakfast: cup of coffee, or a small container of yogurt, scrambled egg, 1/2 cup of grits with  margarine, 2 slices of bacon  . lunch: nothing  . dinner: saute spinach, cabbage or broccoli - patient reports that she does not eat them often, she has tri  -Educated onA1c and blood sugar goals; Exercise goal of 150 minutes per week; -Counseled to check feet daily and get yearly eye exams -Recommended that patient be onboarded to the pharmacy.    Hypothyroidism  -controlled -Current treatment  . Levothyroxine 100 mcg taking 1 tablet by Monday through Saturday  -Recommended to continue current medication  Patient Goals/Self-Care Activities . Over the next 90 days, patient will:  - take medications as prescribed  Follow Up Plan: The patient has been provided with contact information for the care management team and has been advised to call with any health related questions or concerns.       Patient agreed to services and verbal consent obtained.   The patient verbalized understanding of instructions, educational materials, and care plan provided today and agreed to receive a mailed copy of patient instructions, educational materials, and care plan.   [name], PharmD Clinical Pharmacist [facility] [phone]

## 2020-05-26 ENCOUNTER — Ambulatory Visit (INDEPENDENT_AMBULATORY_CARE_PROVIDER_SITE_OTHER): Payer: Medicare PPO

## 2020-05-26 ENCOUNTER — Other Ambulatory Visit: Payer: Self-pay

## 2020-05-26 ENCOUNTER — Ambulatory Visit: Payer: Medicare PPO | Admitting: Internal Medicine

## 2020-05-26 ENCOUNTER — Encounter: Payer: Self-pay | Admitting: Internal Medicine

## 2020-05-26 VITALS — BP 120/60 | HR 53 | Temp 98.1°F | Ht 61.8 in | Wt 137.4 lb

## 2020-05-26 DIAGNOSIS — I739 Peripheral vascular disease, unspecified: Secondary | ICD-10-CM

## 2020-05-26 DIAGNOSIS — N182 Chronic kidney disease, stage 2 (mild): Secondary | ICD-10-CM | POA: Diagnosis not present

## 2020-05-26 DIAGNOSIS — I2510 Atherosclerotic heart disease of native coronary artery without angina pectoris: Secondary | ICD-10-CM | POA: Diagnosis not present

## 2020-05-26 DIAGNOSIS — Z0000 Encounter for general adult medical examination without abnormal findings: Secondary | ICD-10-CM

## 2020-05-26 DIAGNOSIS — I10 Essential (primary) hypertension: Secondary | ICD-10-CM

## 2020-05-26 DIAGNOSIS — E1122 Type 2 diabetes mellitus with diabetic chronic kidney disease: Secondary | ICD-10-CM

## 2020-05-26 DIAGNOSIS — I119 Hypertensive heart disease without heart failure: Secondary | ICD-10-CM

## 2020-05-26 DIAGNOSIS — E039 Hypothyroidism, unspecified: Secondary | ICD-10-CM | POA: Diagnosis not present

## 2020-05-26 DIAGNOSIS — Z6825 Body mass index (BMI) 25.0-25.9, adult: Secondary | ICD-10-CM

## 2020-05-26 DIAGNOSIS — E7800 Pure hypercholesterolemia, unspecified: Secondary | ICD-10-CM

## 2020-05-26 LAB — POCT UA - MICROALBUMIN
Albumin/Creatinine Ratio, Urine, POC: <30
Creatinine, POC: 50 mg/dL
Microalbumin Ur, POC: 10 mg/L

## 2020-05-26 LAB — POCT URINALYSIS DIPSTICK
Bilirubin, UA: NEGATIVE
Blood, UA: NEGATIVE
Glucose, UA: NEGATIVE
Ketones, UA: NEGATIVE
Leukocytes, UA: NEGATIVE
Nitrite, UA: NEGATIVE
Protein, UA: NEGATIVE
Spec Grav, UA: 1.01 (ref 1.010–1.025)
Urobilinogen, UA: 0.2 E.U./dL
pH, UA: 6 (ref 5.0–8.0)

## 2020-05-26 NOTE — Progress Notes (Signed)
I,[name],acting as a Education administrator for Catherine Greenland, MD.,have documented all relevant documentation on the behalf of Catherine Greenland, MD,as directed by  Catherine Greenland, MD while in the presence of Catherine Greenland, MD.  This visit occurred during the SARS-CoV-2 public health emergency.  Safety protocols were in place, including screening questions prior to the visit, additional usage of staff PPE, and extensive cleaning of exam room while observing appropriate contact time as indicated for disinfecting solutions.  Subjective:     Patient ID: [name] , female    DOB: 03/17/1946 , 75 y.o.   MRN: [id number]   Chief Complaint  Patient presents with  . Diabetes  . Hypertension    HPI  The patient is here today for a follow-up on her diabetes and blood pressure. She reports compliance with meds. She denies headaches, chest pain and palpitations.   Diabetes She presents for her follow-up diabetic visit. She has type 2 diabetes mellitus. Pertinent negatives for hypoglycemia include no headaches. Pertinent negatives for diabetes include no blurred vision. There are no hypoglycemic complications. Diabetic complications include heart disease and nephropathy. Risk factors for coronary artery disease include diabetes mellitus, dyslipidemia, hypertension and post-menopausal. She is following a diabetic diet. She participates in exercise intermittently. An ACE inhibitor/angiotensin II receptor blocker is being taken. Eye exam is current.  Hypertension This is a chronic problem. The current episode started more than 1 year ago. The problem has been gradually improving since onset. The problem is controlled. Pertinent negatives include no blurred vision or headaches. The current treatment provides moderate improvement. 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     Past Medical History:  Diagnosis Date  . Benign hypertensive renal disease   . CKD stage 2 due to  type 2 diabetes mellitus ([place])   . Hypertension   . Hypothyroidism   . Obesity   . OSA on CPAP   . Pure hypercholesterolemia   . PVD (peripheral vascular disease) ([place])   . Vitamin D deficiency      Family History  Problem Relation Age of Onset  . Alzheimer's disease Mother   . Aneurysm Father   . Cancer Brother      Current Outpatient Medications:  .  albuterol (PROVENTIL HFA;VENTOLIN HFA) 108 (90 Base) MCG/ACT inhaler, Inhale 2 puffs into the lungs every 6 (six) hours as needed for wheezing or shortness of breath. (Patient not taking: No sig reported), Disp: 1 Inhaler, Rfl: 2 .  amLODipine (NORVASC) 5 MG tablet, Take 5 mg by mouth at bedtime. 1/2 tab (Dr. Doylene Canard), Disp: , Rfl:  .  aspirin EC 81 MG tablet, Take 81 mg by mouth daily., Disp: , Rfl:  .  b complex vitamins tablet, Take 1 tablet by mouth daily with lunch. , Disp: , Rfl:  .  bisacodyl (DULCOLAX) 5 MG EC tablet, Take 5 mg by mouth daily as needed for moderate constipation. , Disp: , Rfl:  .  Calcium Carb-Cholecalciferol (CALCIUM + D3 PO), Take 1 tablet by mouth at bedtime., Disp: , Rfl:  .  cholecalciferol (VITAMIN D) 25 MCG (1000 UNIT) tablet, Take 1 tablet by mouth daily. , Disp: , Rfl:  .  cilostazol (PLETAL) 100 MG tablet, Take 1 tablet (100 mg total) by mouth daily., Disp: 90 tablet, Rfl: 2 .  Evolocumab (REPATHA SURECLICK) 409 MG/ML SOAJ, Inject 140 mg into the skin every 14 (fourteen) days., Disp: 2 mL, Rfl: 11 .  furosemide (LASIX) 20 MG tablet, Take  1 tablet (20 mg total) by mouth daily., Disp: 90 tablet, Rfl: 2 .  hydrALAZINE (APRESOLINE) 10 MG tablet, Take 1 tablet (10 mg total) by mouth every 8 (eight) hours. (Patient taking differently: Take 10 mg by mouth 3 (three) times daily with meals.), Disp: 90 tablet, Rfl: 3 .  isosorbide mononitrate (IMDUR) 60 MG 24 hr tablet, TAKE 1 TABLET EVERY DAY, Disp: 90 tablet, Rfl: 0 .  levothyroxine (SYNTHROID) 100 MCG tablet, Take 1 tablet (100 mcg total) by mouth daily before  breakfast. Monday - Saturday, Disp: 90 tablet, Rfl: 1 .  levothyroxine (SYNTHROID) 88 MCG tablet, Take 88 mcg by mouth daily before breakfast. 1 tab daily, Disp: , Rfl:  .  Magnesium 250 MG TABS, Take 1 tablet (250 mg total) by mouth at bedtime., Disp: 90 tablet, Rfl: 2 .  Multiple Vitamins-Minerals (EMERGEN-C IMMUNE PO), Take 1 packet by mouth daily as needed (TO BOOST THE IMMUNE SYSTEM)., Disp: , Rfl:  .  Multiple Vitamins-Minerals (MULTIVITAMIN WITH MINERALS) tablet, Take 1 tablet by mouth daily with lunch. , Disp: , Rfl:  .  nitroGLYCERIN (NITROSTAT) 0.4 MG SL tablet, Place 1 tablet (0.4 mg total) under the tongue every 5 (five) minutes x 3 doses as needed for chest pain., Disp: 90 tablet, Rfl: 1 .  olmesartan (BENICAR) 40 MG tablet, Take 1 tablet (40 mg total) by mouth daily., Disp: 90 tablet, Rfl: 2 .  potassium chloride (K-DUR) 10 MEQ tablet, Take 10 mEq by mouth 2 (two) times daily with breakfast and lunch. , Disp: , Rfl: 3 .  Semaglutide (OZEMPIC, 0.25 OR 0.5 MG/DOSE, [place]), Inject 0.25 mg into the skin every Thursday. Every other week (Dr. Doylene Canard), Disp: , Rfl:    Allergies  Allergen Reactions  . Atorvastatin Swelling and Other (See Comments)    Limbs swell  . Clopidogrel Other (See Comments)    Caused bruising  . Metoprolol Other (See Comments)    Slows heart rate too low  . Metoprolol Tartrate Swelling and Other (See Comments)    Site of swelling not noted  . Pregabalin Swelling and Other (See Comments)    Site of swelling not noted  . Rosuvastatin Calcium Other (See Comments)    Unknown reaction     Review of Systems  Constitutional: Negative.   Eyes: Negative for blurred vision.  Respiratory: Negative.   Cardiovascular: Negative.   Gastrointestinal: Negative.   Neurological: Negative.  Negative for headaches.  Psychiatric/Behavioral: Negative.      Today's Vitals   05/26/20 1103  BP: 120/60  Pulse: (!) 53  Temp: 98.1 F (36.7 C)  TempSrc: Oral  Weight: 137 lb  6.4 oz (62.3 kg)  Height: 5' 1.8" (1.57 m)   Body mass index is 25.29 kg/m.  Wt Readings from Last 3 Encounters:  05/26/20 137 lb 6.4 oz (62.3 kg)  05/26/20 137 lb 6.4 oz (62.3 kg)  02/02/20 140 lb 6.4 oz (63.7 kg)   Objective:  Physical Exam Vitals and nursing note reviewed.  Constitutional:      Appearance: Normal appearance.  HENT:     Head: Normocephalic and atraumatic.     Nose:     Comments: Masked     Mouth/Throat:     Comments: Masked  Cardiovascular:     Rate and Rhythm: Normal rate and regular rhythm.     Heart sounds: Murmur heard.    Pulmonary:     Effort: Pulmonary effort is normal.     Breath sounds: Normal breath sounds.  Skin:    General: Skin is warm.  Neurological:     General: No focal deficit present.     Mental Status: She is alert.  Psychiatric:        Mood and Affect: Mood normal.        Behavior: Behavior normal.         Assessment And Plan:     1. Type 2 diabetes mellitus with stage 2 chronic kidney disease, without long-term current use of insulin (HCC) Comments: Chronic, I will check labs as listed below. She is encouraged to follow dietary guidelines and to limit intake of sugary drinks, including diet. F/u in 4 months - Hemoglobin A1c - CMP14+EGFR  2. Hypertensive heart disease without heart failure Comments: Chronic, well controlled. She is encouraged to avoid adding salt to her foods.   3. CAD in native artery Comments: Chronic, encouraged to follow heart healthy lifestyle. Advised to comply with statin use and exercise regularly.   4. Primary hypothyroidism Comments: I will check thyroid and adjust meds as needed.  - TSH  5. Peripheral vascular disease, unspecified ([place]) Comments: Chronic, again, importance of regular exercise was discussed with the patient. She will c/w Pletal as per Cardiology.   6. Body mass index (BMI) of 25.0-25.9 in adult Comments: Her BMI is acceptable for her demographic. She is encouraged to aim  for at least 150 minutes of exercise per week.    Patient was given opportunity to ask questions. Patient verbalized understanding of the plan and was able to repeat key elements of the plan. All questions were answered to their satisfaction.   I, Catherine Greenland, MD, have reviewed all documentation for this visit. The documentation on 06/04/20 for the exam, diagnosis, procedures, and orders are all accurate and complete.  THE PATIENT IS ENCOURAGED TO PRACTICE SOCIAL DISTANCING DUE TO THE COVID-19 PANDEMIC.

## 2020-05-26 NOTE — Progress Notes (Signed)
This visit occurred during the SARS-CoV-2 public health emergency.  Safety protocols were in place, including screening questions prior to the visit, additional usage of staff PPE, and extensive cleaning of exam room while observing appropriate contact time as indicated for disinfecting solutions.  Subjective:   [name] is a 75 y.o. female who presents for Medicare Annual (Subsequent) preventive examination.  Review of Systems     Cardiac Risk Factors include: advanced age (>29men, >28 women);diabetes mellitus;dyslipidemia;hypertension     Objective:    Today's Vitals   05/26/20 1039  BP: 120/60  Pulse: (!) 53  Temp: 98.1 F (36.7 C)  TempSrc: Oral  SpO2: 96%  Weight: 137 lb 6.4 oz (62.3 kg)  Height: 5' 1.8" (1.57 m)   Body mass index is 25.29 kg/m.  Advanced Directives 05/26/2020 05/27/2019 05/21/2018 12/24/2017 12/15/2017 06/12/2017 12/26/2016  Does Patient Have a Medical Advance Directive? No No No No No No No  Would patient like information on creating a medical advance directive? No - Patient declined Yes (MAU/Ambulatory/Procedural Areas - Information given) Yes (MAU/Ambulatory/Procedural Areas - Information given) No - Patient declined No - Patient declined No - Patient declined No - Patient declined    Current Medications (verified) Outpatient Encounter Medications as of 05/26/2020  Medication Sig  . amLODipine (NORVASC) 5 MG tablet Take 5 mg by mouth at bedtime. 1/2 tab (Dr. Algie Coffer)  . aspirin EC 81 MG tablet Take 81 mg by mouth daily.  [name] b complex vitamins tablet Take 1 tablet by mouth daily with lunch.   . bisacodyl (DULCOLAX) 5 MG EC tablet Take 5 mg by mouth daily as needed for moderate constipation.   . Calcium Carb-Cholecalciferol (CALCIUM + D3 PO) Take 1 tablet by mouth at bedtime.  . cholecalciferol (VITAMIN D) 25 MCG (1000 UNIT) tablet Take 1 tablet by mouth daily.   . cilostazol (PLETAL) 100 MG tablet Take 1 tablet (100 mg total) by mouth daily.  .  Evolocumab (REPATHA SURECLICK) 140 MG/ML SOAJ Inject 140 mg into the skin every 14 (fourteen) days.  . furosemide (LASIX) 20 MG tablet Take 1 tablet (20 mg total) by mouth daily.  . hydrALAZINE (APRESOLINE) 10 MG tablet Take 1 tablet (10 mg total) by mouth every 8 (eight) hours. (Patient taking differently: Take 10 mg by mouth 3 (three) times daily with meals.)  . isosorbide mononitrate (IMDUR) 60 MG 24 hr tablet TAKE 1 TABLET EVERY DAY  . levothyroxine (SYNTHROID) 100 MCG tablet Take 1 tablet (100 mcg total) by mouth daily before breakfast. Monday - Saturday  . Magnesium 250 MG TABS Take 1 tablet (250 mg total) by mouth at bedtime.  . Multiple Vitamins-Minerals (EMERGEN-C IMMUNE PO) Take 1 packet by mouth daily as needed (TO BOOST THE IMMUNE SYSTEM).  . Multiple Vitamins-Minerals (MULTIVITAMIN WITH MINERALS) tablet Take 1 tablet by mouth daily with lunch.   . nitroGLYCERIN (NITROSTAT) 0.4 MG SL tablet Place 1 tablet (0.4 mg total) under the tongue every 5 (five) minutes x 3 doses as needed for chest pain.  [name] olmesartan (BENICAR) 40 MG tablet Take 1 tablet (40 mg total) by mouth daily.  . potassium chloride (K-DUR) 10 MEQ tablet Take 10 mEq by mouth 2 (two) times daily with breakfast and lunch.   . Semaglutide (OZEMPIC, 0.25 OR 0.5 MG/DOSE, [place]) Inject 0.25 mg into the skin every Thursday. Every other week (Dr. Algie Coffer)  . albuterol (PROVENTIL HFA;VENTOLIN HFA) 108 (90 Base) MCG/ACT inhaler Inhale 2 puffs into the lungs every 6 (six) hours  as needed for wheezing or shortness of breath. (Patient not taking: No sig reported)  . levothyroxine (SYNTHROID) 112 MCG tablet Take 1 tablet (112 mcg total) by mouth daily. Monday - Saturday (Patient not taking: No sig reported)   No facility-administered encounter medications on file as of 05/26/2020.    Allergies (verified) Atorvastatin, Clopidogrel, Metoprolol, Metoprolol tartrate, Pregabalin, and Rosuvastatin calcium   History: Past Medical History:   Diagnosis Date  . Benign hypertensive renal disease   . CKD stage 2 due to type 2 diabetes mellitus (HCC)   . Hypertension   . Hypothyroidism   . Obesity   . OSA on CPAP   . Pure hypercholesterolemia   . PVD (peripheral vascular disease) (HCC)   . Vitamin D deficiency    Past Surgical History:  Procedure Laterality Date  . ABDOMINAL HYSTERECTOMY    . BREAST CYST EXCISION Right over 20 yrs  . broken leg and displaced ankle    . DILATION AND CURETTAGE OF UTERUS    . LEFT HEART CATH AND CORONARY ANGIOGRAPHY N/A 06/13/2017   Procedure: LEFT HEART CATH AND CORONARY ANGIOGRAPHY;  Surgeon: Orpah Cobb, MD;  Location: [facility];  Service: Cardiovascular;  Laterality: N/A;  . right and left rotator cuff    . THYROIDECTOMY     Family History  Problem Relation Age of Onset  . Alzheimer's disease Mother   . Aneurysm Father   . Cancer Brother    Social History   Socioeconomic History  . Marital status: Married    Spouse name: Not on file  . Number of children: 3  . Years of education: Not on file  . Highest education level: Not on file  Occupational History  . Occupation: retired  Tobacco Use  . Smoking status: Never Smoker  . Smokeless tobacco: Never Used  Vaping Use  . Vaping Use: Never used  Substance and Sexual Activity  . Alcohol use: No    Alcohol/week: 0.0 standard drinks    Comment: occasional wine  . Drug use: No  . Sexual activity: Yes  Other Topics Concern  . Not on file  Social History Narrative   Drinks 1-2 cups daily of coffee daily.   Social Determinants of Health   Financial Resource Strain: Low Risk   . Difficulty of Paying Living Expenses: Not hard at all  Food Insecurity: No Food Insecurity  . Worried About Programme researcher, broadcasting/film/video in the Last Year: Never true  . Ran Out of Food in the Last Year: Never true  Transportation Needs: No Transportation Needs  . Lack of Transportation (Medical): No  . Lack of Transportation (Non-Medical): No   Physical Activity: Sufficiently Active  . Days of Exercise per Week: 7 days  . Minutes of Exercise per Session: 30 min  Stress: No Stress Concern Present  . Feeling of Stress : Not at all  Social Connections: Not on file    Tobacco Counseling Counseling given: Not Answered   Clinical Intake:  Pre-visit preparation completed: Yes  Pain : No/denies pain     Nutritional Status: BMI 25 -29 Overweight Nutritional Risks: None Diabetes: Yes  How often do you need to have someone help you when you read instructions, pamphlets, or other written materials from your doctor or pharmacy?: 1 - Never What is the last grade level you completed in school?: some college  Diabetic? Yes Nutrition Risk Assessment:  Has the patient had any N/V/D within the last 2 months?  No  Does  the patient have any non-healing wounds?  No  Has the patient had any unintentional weight loss or weight gain?  No   Diabetes:  Is the patient diabetic?  Yes  If diabetic, was a CBG obtained today?  No  Did the patient bring in their glucometer from home?  No  How often do you monitor your CBG's? daily.   Financial Strains and Diabetes Management:  Are you having any financial strains with the device, your supplies or your medication? No .  Does the patient want to be seen by Chronic Care Management for management of their diabetes?  No  Would the patient like to be referred to a Nutritionist or for Diabetic Management?  No   Diabetic Exams:  Diabetic Eye Exam: Overdue for diabetic eye exam. Pt has been advised about the importance in completing this exam. Patient advised to call and schedule an eye exam. Diabetic Foot Exam: Overdue, Pt has been advised about the importance in completing this exam. Pt is scheduled for diabetic foot exam on next appointment.   Interpreter Needed?: No  Information entered by :: NAllen LPN   Activities of Daily Living In your present state of health, do you have any  difficulty performing the following activities: 05/26/2020 05/27/2019  Hearing? N N  Vision? N N  Difficulty concentrating or making decisions? N N  Walking or climbing stairs? N N  Dressing or bathing? N N  Doing errands, shopping? N N  Preparing Food and eating ? N N  Using the Toilet? N N  In the past six months, have you accidently leaked urine? Y Y  Comment - wears a pad  Do you have problems with loss of bowel control? N N  Managing your Medications? N N  Managing your Finances? N N  Housekeeping or managing your Housekeeping? N N  Some recent data might be hidden    Patient Care Team: [name], MD as PCP - General (Internal Medicine) Chalmers Guest, MD as Consulting Physician (Ophthalmology) [name] as Social Worker Little, [name], RN as Case Manager Harlan Stains, [facility] (Pharmacist)  Indicate any recent Medical Services you may have received from other than [facility] providers in the past year (date may be approximate).     Assessment:   This is a routine wellness examination for Brittlyn.  Hearing/Vision screen  Hearing Screening   125Hz  250Hz  500Hz  1000Hz  2000Hz  3000Hz  4000Hz  6000Hz  8000Hz   Right ear:           Left ear:           Vision Screening Comments: No regular eye exams, Dr.  Dietary issues and exercise activities discussed: Current Exercise Habits: Home exercise routine, Type of exercise: walking, Time (Minutes): 30, Frequency (Times/Week): 7, Weekly Exercise (Minutes/Week): 210  Goals    .  "I would like to keep my Diabetes under good conrol" (pt-stated)      CARE PLAN ENTRY (see longtitudinal plan of care for additional care plan information)  Current Barriers:  Knowledge Deficits related to disease process and Self Health management of DM . Chronic Disease Management support and education needs related to DM II with stage III CKD, Hypothyroidism, Hypercholesterolemia, HTN  Nurse Case Manager Clinical Goal(s):  New 12/22/19 Over  the next 180 days, patient will work with the CCM team and PCP to address needs related to disease education and support to improve Self Health management of DM.   CCM RN CM Interventions:  12/22/19  call completed with patient  . Evaluation of current treatment plan related to DM and patient's adherence to plan as established by provider. . Provided education to patient re: current A1c 5.7%; Re-educated on target A1c; Re-educated on dietary and exercise recommendations; Re-educated on daily glycemic control, FBS 80-130, <180 after meals  ' . Positive Reinforcement provided to patient for making efforts to lower her A1c and improve her overall health  . Reviewed medications with patient and discussed indication, dosage and frequency of prescribed medications; patient working with Pharm D for financial assistance of meds, denies having noted SE  . Provided patient with printed educational materials related to Diabetes Care Schedule; Preventing Complications from Diabetes; Grocery Shopping with Diabetes . Advised patient, providing education and rationale, to check cbg 1-2 daily before meals and record, calling CCM RN and or PCP for findings outside established parameters . Discussed plans with patient for ongoing care management follow up and provided patient with direct contact information for care management team   Patient Self Care Activities:  . Self administers medications as prescribed . Attends all scheduled provider appointments . Calls pharmacy for medication refills . Performs ADL's independently . Performs IADL's independently . Calls provider office for new concerns or questions  Please see past updates related to this goal by clicking on the "Past Updates" button in the selected goal      .  "I would like to keep my kidney's healthy" (pt-stated)      CARE PLAN ENTRY (see longtitudinal plan of care for additional care plan information)  Current Barriers:  [name] Knowledge Deficits  related to disease process and Self Health management of CKD . Chronic Disease Management support and education needs related to DMII with stage 3 CKD, Hypothyroidism, Hypercholesterolemia, HTN  Nurse Case Manager Clinical Goal(s):  . 12/22/19 New Over the next 180 days, patient will work with CCM RN and PCP to address needs related to disease education and support for improved Self Management of CKD  CCM RN CM Interventions:  12/22/19 call completed with patient . Evaluation of current treatment plan related to CKD and patient's adherence to plan as established by provider. [name] Re-educated patient re: stages of CKD providing rationale and discussed patient's current GFR of <55; Educated on ways to improve kidney function and Self Health management of this condition  . Provided patient with printed educational materials related to [employer] with Diabetes; Salt Substitutes for Kidney Disease . Discussed plans with patient for ongoing care management follow up and provided patient with direct contact information for care management team  Patient Self Care Activities:  . Self administers medications as prescribed . Attends all scheduled provider appointments . Calls pharmacy for medication refills . Performs ADL's independently . Performs IADL's independently . Calls provider office for new concerns or questions  Please see past updates related to this goal by clicking on the "Past Updates" button in the selected goal      .  "to manage my thyroid disorder" (pt-stated)      Timeframe:  Long-Range Goal Priority:  High Start Date:  04/07/20                           Expected End Date:  07/06/20  Follow up date: 05/30/20  Over the next 90 days, patient will: -take Synthroid exactly as prescribed w/o missed doses -Call PCP and or CCM team for questions or concerns -Keep all scheduled MD follow up appointments  as recommended                        .  LDL CALC < 70    .  Manage My  Medicine      Timeframe:  Long-Range Goal Priority:  High Start Date:                             Expected End Date:                       Follow Up Date 08/09/2020   - call for medicine refill 2 or 3 days before it runs out - call if I am sick and can't take my medicine - use a pillbox to sort medicine    Why is this important?   . These steps will help you keep on track with your medicines.       .  Patient Stated (pt-stated)      Wants to get off of medicines    .  Patient Stated      05/26/2020, get off medications    .  Pharmacy Care Plan      .CARE PLAN ENTRY (see longitudinal plan of care for additional care plan information)  Current Barriers:  . Chronic Disease Management support, education, and care coordination needs related to Hypertension, Hyperlipidemia, and Diabetes   Hypertension BP Readings from Last 3 Encounters:  09/30/19 118/74  09/23/19 120/72  08/31/19 120/60   . Pharmacist Clinical Goal(s): o Over the next 180 days, patient will work with PharmD and providers to maintain BP goal <130/80 . Current regimen:   Amlodipine 5 mg at bedtime   Furosemide 20 mg daily  Hydralazine  every 8 hours  Olmesartan 40 mg daily . Interventions: o Counseled patient to take hydralazine doses 8 hours apart o Provided dietary and exercise recommendations . Patient self care activities - Over the next 180 days, patient will: o Check BP periodically and if symptomatic, document, and provide at future appointments o Ensure daily salt intake < 2300 mg/day o Exercise at least 30 minutes daily, 5 days per week  Hyperlipidemia Lab Results  Component Value Date/Time   LDLCALC 134 (H) 07/14/2019 04:33 AM   LDLCALC 116 (H) 06/02/2019 09:18 AM   . Pharmacist Clinical Goal(s): o Over the next 90 days, patient will work with PharmD and providers to achieve LDL goal < 70 . Current regimen:  o Repatha  every 14 days . Interventions: o Provided dietary and  exercise recommendations o Determined patient has not started Repatha due to waiting on [employer] information in the mail o Located patient's information in [facility] system and provided billing information to [employer]. Confirmed $0 copay for Repatha o Notified patient that Repatha is available for pickup . Patient self care activities - Over the next 90 days, patient will: o Exercise at least 30 minutes daily, 5 days per week o Continue to follow cholesterol lowering diet as provided by [facility]  Diabetes Lab Results  Component Value Date/Time   HGBA1C 5.7 (H) 09/30/2019 10:48 AM   HGBA1C 5.6 06/02/2019 09:18 AM   . Pharmacist Clinical Goal(s): o Over the next 180 days, patient will work with PharmD and providers to maintain A1c goal <7% . Current regimen:  o Ozempic 0.25 mg SQ Thursdays . Interventions: o Provided dietary and exercise recommendations o Confirmed patient received Ozempic patient assistance  shipment in July . Patient self care activities - Over the next 180 days, patient will: o Check blood sugar once daily, document, and provide at future appointments o Contact provider with any episodes of hypoglycemia o Exercise at least 30 minutes daily, 5 days per week  Medication management . Pharmacist Clinical Goal(s): o Over the next 90 days, patient will work with PharmD and providers to achieve optimal medication adherence . Current pharmacy: [facility] . Interventions o Comprehensive medication review performed. o Continue current medication management strategy o Determined [facility] does NOT offer pill packaging at this time o Discussed with patient that [facility] does offer medication synchronization, adherence packaging and delivery, but it is more expensive than [facility] medications . Patient self care activities - Over the next 90 days, patient will: o Focus on medication adherence by continued use of pill box o Take medications as  prescribed o Report any questions or concerns to PharmD and/or provider(s)  Please see past updates related to this goal by clicking on the "Past Updates" button in the selected goal      .  Weight (lb) < 200 lb (90.7 kg)      05/27/2019, wants to get down to 150 pounds and get off some medications    .  Work with SW to manage care coordination needs      Timeframe:  Long-Range Goal Priority:  Low Start Date:  2.8.22                           Expected End Date:   6.8.22                    Next planned outreach: 4.8.22  Patient Goals/Self-Care Activities . Over the next 90 days, patient will:   - Patient will self administer medications as prescribed Patient will attend all scheduled provider appointments Patient will call pharmacy for medication refills Patient will call provider office for new concerns or questions Contact SW as needed prior to next scheduled call      Depression Screen PHQ 2/9 Scores 05/26/2020 09/30/2019 05/27/2019 02/12/2019 11/13/2018 09/23/2018 05/21/2018  PHQ - 2 Score 0 0 0 0 0 0 2  PHQ- 9 Score - 0 1 4 4  - 7    Fall Risk Fall Risk  05/26/2020 05/27/2019 09/23/2018 05/21/2018 02/17/2018  Falls in the past year? 0 0 0 1 0  Number falls in past yr: - - - 0 -  Comment - - - slipped in bath tub -  Injury with Fall? - - - 0 -  Risk for fall due to : Medication side effect Medication side effect - Medication side effect -  Follow up Falls evaluation completed;Education provided;Falls prevention discussed Falls evaluation completed;Education provided;Falls prevention discussed - Education provided;Falls prevention discussed -    FALL RISK PREVENTION PERTAINING TO THE HOME:  Any stairs in or around the home? Yes  If so, are there any without handrails? Yes  Home free of loose throw rugs in walkways, pet beds, electrical cords, etc? Yes  Adequate lighting in your home to reduce risk of falls? Yes   ASSISTIVE DEVICES UTILIZED TO PREVENT FALLS:  Life alert? No  Use of  a cane, walker or w/c? No  Grab bars in the bathroom? No  Shower chair or bench in shower? No  Elevated toilet seat or a handicapped toilet? Yes   TIMED UP AND GO:  Was the  test performed? No . .   Gait steady and fast without use of assistive device  Cognitive Function:     6CIT Screen 05/26/2020 05/27/2019 05/21/2018  What Year? 0 points 0 points 0 points  What month? 0 points 0 points 0 points  What time? 0 points 0 points 3 points  Count back from 20 0 points 0 points 0 points  Months in reverse 0 points 0 points 0 points  Repeat phrase 2 points 0 points 0 points  Total Score 2 0 3    Immunizations Immunization History  Administered Date(s) Administered  . Influenza, High Dose Seasonal PF 03/30/2014, 01/31/2015, 04/04/2016, 12/18/2016, 11/13/2018, 01/04/2020  . Influenza-Unspecified 01/24/2013, 12/21/2017  . Moderna SARS-COV2 Booster Vaccination 02/05/2020  . Moderna Sars-Covid-2 Vaccination 05/13/2019, 06/10/2019  . Pneumococcal Polysaccharide-23 12/27/2016  . Zoster 02/03/2013    TDAP status: Up to date  Flu Vaccine status: Up to date  Pneumococcal vaccine status: Up to date  Covid-19 vaccine status: Completed vaccines  Qualifies for Shingles Vaccine? Yes   Zostavax completed Yes   Shingrix Completed?: No.    Education has been provided regarding the importance of this vaccine. Patient has been advised to call insurance company to determine out of pocket expense if they have not yet received this vaccine. Advised may also receive vaccine at local pharmacy or Health Dept. Verbalized acceptance and understanding.  Screening Tests Health Maintenance  Topic Date Due  . FOOT EXAM  11/13/2019  . OPHTHALMOLOGY EXAM  02/27/2020  . HEMOGLOBIN A1C  08/01/2020  . TETANUS/TDAP  11/11/2022  . COLONOSCOPY (Pts 45-59yrs Insurance coverage will need to be confirmed)  11/27/2022  . INFLUENZA VACCINE  Completed  . DEXA SCAN  Completed  . COVID-19 Vaccine  Completed  .  Hepatitis C Screening  Completed  . PNA vac Low Risk Adult  Completed  . HPV VACCINES  Aged Out    Health Maintenance  Health Maintenance Due  Topic Date Due  . FOOT EXAM  11/13/2019  . OPHTHALMOLOGY EXAM  02/27/2020    Colorectal cancer screening: No longer required.   Mammogram status: Completed 02/26/2020. Repeat every year  Bone Density status: Completed 08/02/2014.   Lung Cancer Screening: (Low Dose CT Chest recommended if Age 67-80 years, 30 pack-year currently smoking OR have quit w/in 15years.) does not qualify.   Lung Cancer Screening Referral: no  Additional Screening:  Hepatitis C Screening: does qualify; Completed 04/03/2012  Vision Screening: Recommended annual ophthalmology exams for early detection of glaucoma and other disorders of the eye. Is the patient up to date with their annual eye exam?  No  Who is the provider or what is the name of the office in which the patient attends annual eye exams? Dr. Harlon Flor If pt is not established with a provider, would they like to be referred to a provider to establish care? No .   Dental Screening: Recommended annual dental exams for proper oral hygiene  Community Resource Referral / Chronic Care Management: CRR required this visit?  No   CCM required this visit?  No      Plan:     I have personally reviewed and noted the following in the patient's chart:   . Medical and social history . Use of alcohol, tobacco or illicit drugs  . Current medications and supplements . Functional ability and status . Nutritional status . Physical activity . Advanced directives . List of other physicians . Hospitalizations, surgeries, and ER visits in previous 12 months .  Vitals . Screenings to include cognitive, depression, and falls . Referrals and appointments  In addition, I have reviewed and discussed with patient certain preventive protocols, quality metrics, and best practice recommendations. A written personalized  care plan for preventive services as well as general preventive health recommendations were provided to patient.     Barb Merino, LPN   08/29/4401   Nurse Notes:

## 2020-05-26 NOTE — Addendum Note (Signed)
Addended by: Barb Merino on: 05/26/2020 02:41 PM   Modules accepted: Orders

## 2020-05-26 NOTE — Patient Instructions (Addendum)
[name] , Thank you for taking time to come for your Medicare Wellness Visit. I appreciate your ongoing commitment to your health goals. Please review the following plan we discussed and let me know if I can assist you in the future.   Screening recommendations/referrals: Colonoscopy: not required Mammogram: completed 02/26/2020 Bone Density: completed 08/02/2014 Recommended yearly ophthalmology/optometry visit for glaucoma screening and checkup Recommended yearly dental visit for hygiene and checkup  Vaccinations: Influenza vaccine: completed 01/04/2020, due 10/24/2020 Pneumococcal vaccine: completed 12/27/2016 Tdap vaccine: completed 11/10/2012, due 11/11/2022 Shingles vaccine: discussed   Covid-19: 02/05/2020, 06/10/2019, 05/13/2019  Advanced directives: Advance directive discussed with you today.   Conditions/risks identified: none  Next appointment: Follow up in one year for your annual wellness visit    Preventive Care 65 Years and Older, Female Preventive care refers to lifestyle choices and visits with your health care provider that can promote health and wellness. What does preventive care include?  A yearly physical exam. This is also called an annual well check.  Dental exams once or twice a year.  Routine eye exams. Ask your health care provider how often you should have your eyes checked.  Personal lifestyle choices, including:  Daily care of your teeth and gums.  Regular physical activity.  Eating a healthy diet.  Avoiding tobacco and drug use.  Limiting alcohol use.  Practicing safe sex.  Taking low-dose aspirin every day.  Taking vitamin and mineral supplements as recommended by your health care provider. What happens during an annual well check? The services and screenings done by your health care provider during your annual well check will depend on your age, overall health, lifestyle risk factors, and family history of disease. Counseling  Your health  care provider may ask you questions about your:  Alcohol use.  Tobacco use.  Drug use.  Emotional well-being.  Home and relationship well-being.  Sexual activity.  Eating habits.  History of falls.  Memory and ability to understand (cognition).  Work and work Astronomer.  Reproductive health. Screening  You may have the following tests or measurements:  Height, weight, and BMI.  Blood pressure.  Lipid and cholesterol levels. These may be checked every 5 years, or more frequently if you are over 60 years old.  Skin check.  Lung cancer screening. You may have this screening every year starting at age 42 if you have a 30-pack-year history of smoking and currently smoke or have quit within the past 15 years.  Fecal occult blood test (FOBT) of the stool. You may have this test every year starting at age 103.  Flexible sigmoidoscopy or colonoscopy. You may have a sigmoidoscopy every 5 years or a colonoscopy every 10 years starting at age 24.  Hepatitis C blood test.  Hepatitis B blood test.  Sexually transmitted disease (STD) testing.  Diabetes screening. This is done by checking your blood sugar (glucose) after you have not eaten for a while (fasting). You may have this done every 1-3 years.  Bone density scan. This is done to screen for osteoporosis. You may have this done starting at age 60.  Mammogram. This may be done every 1-2 years. Talk to your health care provider about how often you should have regular mammograms. Talk with your health care provider about your test results, treatment options, and if necessary, the need for more tests. Vaccines  Your health care provider may recommend certain vaccines, such as:  Influenza vaccine. This is recommended every year.  Tetanus, diphtheria, and acellular  pertussis (Tdap, Td) vaccine. You may need a Td booster every 10 years.  Zoster vaccine. You may need this after age 38.  Pneumococcal 13-valent conjugate  (PCV13) vaccine. One dose is recommended after age 37.  Pneumococcal polysaccharide (PPSV23) vaccine. One dose is recommended after age 42. Talk to your health care provider about which screenings and vaccines you need and how often you need them. This information is not intended to replace advice given to you by your health care provider. Make sure you discuss any questions you have with your health care provider. Document Released: 04/08/2015 Document Revised: 11/30/2015 Document Reviewed: 01/11/2015 Elsevier Interactive Patient Education  2017 [place] Prevention in the Home Falls can cause injuries. They can happen to people of all ages. There are many things you can do to make your home safe and to help prevent falls. What can I do on the outside of my home?  Regularly fix the edges of walkways and driveways and fix any cracks.  Remove anything that might make you trip as you walk through a door, such as a raised step or threshold.  Trim any bushes or trees on the path to your home.  Use bright outdoor lighting.  Clear any walking paths of anything that might make someone trip, such as rocks or tools.  Regularly check to see if handrails are loose or broken. Make sure that both sides of any steps have handrails.  Any raised decks and porches should have guardrails on the edges.  Have any leaves, snow, or ice cleared regularly.  Use sand or salt on walking paths during winter.  Clean up any spills in your garage right away. This includes oil or grease spills. What can I do in the bathroom?  Use night lights.  Install grab bars by the toilet and in the tub and shower. Do not use towel bars as grab bars.  Use non-skid mats or decals in the tub or shower.  If you need to sit down in the shower, use a plastic, non-slip stool.  Keep the floor dry. Clean up any water that spills on the floor as soon as it happens.  Remove soap buildup in the tub or shower  regularly.  Attach bath mats securely with double-sided non-slip rug tape.  Do not have throw rugs and other things on the floor that can make you trip. What can I do in the bedroom?  Use night lights.  Make sure that you have a light by your bed that is easy to reach.  Do not use any sheets or blankets that are too big for your bed. They should not hang down onto the floor.  Have a firm chair that has side arms. You can use this for support while you get dressed.  Do not have throw rugs and other things on the floor that can make you trip. What can I do in the kitchen?  Clean up any spills right away.  Avoid walking on wet floors.  Keep items that you use a lot in easy-to-reach places.  If you need to reach something above you, use a strong step stool that has a grab bar.  Keep electrical cords out of the way.  Do not use floor polish or wax that makes floors slippery. If you must use wax, use non-skid floor wax.  Do not have throw rugs and other things on the floor that can make you trip. What can I do with my stairs?  Do not leave any items on the stairs.  Make sure that there are handrails on both sides of the stairs and use them. Fix handrails that are broken or loose. Make sure that handrails are as long as the stairways.  Check any carpeting to make sure that it is firmly attached to the stairs. Fix any carpet that is loose or worn.  Avoid having throw rugs at the top or bottom of the stairs. If you do have throw rugs, attach them to the floor with carpet tape.  Make sure that you have a light switch at the top of the stairs and the bottom of the stairs. If you do not have them, ask someone to add them for you. What else can I do to help prevent falls?  Wear shoes that:  Do not have high heels.  Have rubber bottoms.  Are comfortable and fit you well.  Are closed at the toe. Do not wear sandals.  If you use a stepladder:  Make sure that it is fully  opened. Do not climb a closed stepladder.  Make sure that both sides of the stepladder are locked into place.  Ask someone to hold it for you, if possible.  Clearly mark and make sure that you can see:  Any grab bars or handrails.  First and last steps.  Where the edge of each step is.  Use tools that help you move around (mobility aids) if they are needed. These include:  Canes.  Walkers.  Scooters.  Crutches.  Turn on the lights when you go into a dark area. Replace any light bulbs as soon as they burn out.  Set up your furniture so you have a clear path. Avoid moving your furniture around.  If any of your floors are uneven, fix them.  If there are any pets around you, be aware of where they are.  Review your medicines with your doctor. Some medicines can make you feel dizzy. This can increase your chance of falling. Ask your doctor what other things that you can do to help prevent falls. This information is not intended to replace advice given to you by your health care provider. Make sure you discuss any questions you have with your health care provider. Document Released: 01/06/2009 Document Revised: 08/18/2015 Document Reviewed: 04/16/2014 Elsevier Interactive Patient Education  2017 [employer].

## 2020-05-26 NOTE — Patient Instructions (Signed)
Diabetes Mellitus and Foot Care Foot care is an important part of your health, especially when you have diabetes. Diabetes may cause you to have problems because of poor blood flow (circulation) to your feet and legs, which can cause your skin to:  Become thinner and drier.  Break more easily.  Heal more slowly.  Peel and crack. You may also have nerve damage (neuropathy) in your legs and feet, causing decreased feeling in them. This means that you may not notice minor injuries to your feet that could lead to more serious problems. Noticing and addressing any potential problems early is the best way to prevent future foot problems. How to care for your feet Foot hygiene  Wash your feet daily with warm water and mild soap. Do not use hot water. Then, pat your feet and the areas between your toes until they are completely dry. Do not soak your feet as this can dry your skin.  Trim your toenails straight across. Do not dig under them or around the cuticle. File the edges of your nails with an emery board or nail file.  Apply a moisturizing lotion or petroleum jelly to the skin on your feet and to dry, brittle toenails. Use lotion that does not contain alcohol and is unscented. Do not apply lotion between your toes.   Shoes and socks  Wear clean socks or stockings every day. Make sure they are not too tight. Do not wear knee-high stockings since they may decrease blood flow to your legs.  Wear shoes that fit properly and have enough cushioning. Always look in your shoes before you put them on to be sure there are no objects inside.  To break in new shoes, wear them for just a few hours a day. This prevents injuries on your feet. Wounds, scrapes, corns, and calluses  Check your feet daily for blisters, cuts, bruises, sores, and redness. If you cannot see the bottom of your feet, use a mirror or ask someone for help.  Do not cut corns or calluses or try to remove them with medicine.  If you  find a minor scrape, cut, or break in the skin on your feet, keep it and the skin around it clean and dry. You may clean these areas with mild soap and water. Do not clean the area with peroxide, alcohol, or iodine.  If you have a wound, scrape, corn, or callus on your foot, look at it several times a day to make sure it is healing and not infected. Check for: ? Redness, swelling, or pain. ? Fluid or blood. ? Warmth. ? Pus or a bad smell.   General tips  Do not cross your legs. This may decrease blood flow to your feet.  Do not use heating pads or hot water bottles on your feet. They may burn your skin. If you have lost feeling in your feet or legs, you may not know this is happening until it is too late.  Protect your feet from hot and cold by wearing shoes, such as at the beach or on hot pavement.  Schedule a complete foot exam at least once a year (annually) or more often if you have foot problems. Report any cuts, sores, or bruises to your health care provider immediately. Where to find more information  American Diabetes Association: www.diabetes.org  Association of Diabetes Care & Education Specialists: www.diabeteseducator.org Contact a health care provider if:  You have a medical condition that increases your risk of infection and   you have any cuts, sores, or bruises on your feet.  You have an injury that is not healing.  You have redness on your legs or feet.  You feel burning or tingling in your legs or feet.  You have pain or cramps in your legs and feet.  Your legs or feet are numb.  Your feet always feel cold.  You have pain around any toenails. Get help right away if:  You have a wound, scrape, corn, or callus on your foot and: ? You have pain, swelling, or redness that gets worse. ? You have fluid or blood coming from the wound, scrape, corn, or callus. ? Your wound, scrape, corn, or callus feels warm to the touch. ? You have pus or a bad smell coming from  the wound, scrape, corn, or callus. ? You have a fever. ? You have a red line going up your leg. Summary  Check your feet every day for blisters, cuts, bruises, sores, and redness.  Apply a moisturizing lotion or petroleum jelly to the skin on your feet and to dry, brittle toenails.  Wear shoes that fit properly and have enough cushioning.  If you have foot problems, report any cuts, sores, or bruises to your health care provider immediately.  Schedule a complete foot exam at least once a year (annually) or more often if you have foot problems. This information is not intended to replace advice given to you by your health care provider. Make sure you discuss any questions you have with your health care provider. Document Revised: 10/01/2019 Document Reviewed: 10/01/2019 Elsevier Patient Education  2021 Elsevier Inc.  

## 2020-05-27 ENCOUNTER — Telehealth: Payer: Self-pay

## 2020-05-27 LAB — CMP14+EGFR
ALT: 36 IU/L — ABNORMAL HIGH (ref 0–32)
AST: 45 IU/L — ABNORMAL HIGH (ref 0–40)
Albumin/Globulin Ratio: 2 (ref 1.2–2.2)
Albumin: 4.3 g/dL (ref 3.7–4.7)
Alkaline Phosphatase: 54 IU/L (ref 44–121)
BUN/Creatinine Ratio: 9 — ABNORMAL LOW (ref 12–28)
BUN: 10 mg/dL (ref 8–27)
Bilirubin Total: 0.4 mg/dL (ref 0.0–1.2)
CO2: 22 mmol/L (ref 20–29)
Calcium: 10.3 mg/dL (ref 8.7–10.3)
Chloride: 104 mmol/L (ref 96–106)
Creatinine, Ser: 1.06 mg/dL — ABNORMAL HIGH (ref 0.57–1.00)
Globulin, Total: 2.1 g/dL (ref 1.5–4.5)
Glucose: 100 mg/dL — ABNORMAL HIGH (ref 65–99)
Potassium: 4.4 mmol/L (ref 3.5–5.2)
Sodium: 140 mmol/L (ref 134–144)
Total Protein: 6.4 g/dL (ref 6.0–8.5)
eGFR: 55 mL/min/{1.73_m2} — ABNORMAL LOW (ref >59)

## 2020-05-27 LAB — HEMOGLOBIN A1C
Est. average glucose Bld gHb Est-mCnc: 126 mg/dL
Hgb A1c MFr Bld: 6 % — ABNORMAL HIGH (ref 4.8–5.6)

## 2020-05-27 LAB — TSH: TSH: 0.402 u[IU]/mL — ABNORMAL LOW (ref 0.450–4.500)

## 2020-05-27 NOTE — Chronic Care Management (AMB) (Signed)
Chronic Care Management Pharmacy Assistant   Name: [name]  MRN: [id number] DOB: [birth year]   Reason for Encounter: Medication Review-Hypertension Adherence Call.    Recent office visits:  05/26/20-Sanders, Catherine Schools, MD (OV).  Recent consult visits:  None  Hospital visits:  None in previous 6 months  Medications: Outpatient Encounter Medications as of 05/27/2020  Medication Sig Note  . albuterol (PROVENTIL HFA;VENTOLIN HFA) 108 (90 Base) MCG/ACT inhaler Inhale 2 puffs into the lungs every 6 (six) hours as needed for wheezing or shortness of breath. (Patient not taking: No sig reported)   . amLODipine (NORVASC) 5 MG tablet Take 5 mg by mouth at bedtime. 1/2 tab (Dr. Algie [name])   . aspirin EC 81 MG tablet Take 81 mg by mouth daily. 01/31/2020: Patient would not disclose  . b complex vitamins tablet Take 1 tablet by mouth daily with lunch.  01/31/2020: Patient would not disclose  . bisacodyl (DULCOLAX) 5 MG EC tablet Take 5 mg by mouth daily as needed for moderate constipation.  01/31/2020: Patient would not disclose  . Calcium Carb-Cholecalciferol (CALCIUM + D3 PO) Take 1 tablet by mouth at bedtime. 01/31/2020: Patient would not disclose  . cholecalciferol (VITAMIN D) 25 MCG (1000 UNIT) tablet Take 1 tablet by mouth daily.  01/31/2020: Patient would not disclose  . cilostazol (PLETAL) 100 MG tablet Take 1 tablet (100 mg total) by mouth daily.   . Evolocumab (REPATHA SURECLICK) 140 MG/ML SOAJ Inject 140 mg into the skin every 14 (fourteen) days.   . furosemide (LASIX) 20 MG tablet Take 1 tablet (20 mg total) by mouth daily.   . hydrALAZINE (APRESOLINE) 10 MG tablet Take 1 tablet (10 mg total) by mouth every 8 (eight) hours. (Patient taking differently: Take 10 mg by mouth 3 (three) times daily with meals.)   . isosorbide mononitrate (IMDUR) 60 MG 24 hr tablet TAKE 1 TABLET EVERY DAY   . levothyroxine (SYNTHROID) 100 MCG tablet Take 1 tablet (100 mcg total) by mouth daily before  breakfast. Monday - Saturday   . Magnesium 250 MG TABS Take 1 tablet (250 mg total) by mouth at bedtime.   . Multiple Vitamins-Minerals (EMERGEN-C IMMUNE PO) Take 1 packet by mouth daily as needed (TO BOOST THE IMMUNE SYSTEM).   . Multiple Vitamins-Minerals (MULTIVITAMIN WITH MINERALS) tablet Take 1 tablet by mouth daily with lunch.    . nitroGLYCERIN (NITROSTAT) 0.4 MG SL tablet Place 1 tablet (0.4 mg total) under the tongue every 5 (five) minutes x 3 doses as needed for chest pain.   [name] olmesartan (BENICAR) 40 MG tablet Take 1 tablet (40 mg total) by mouth daily.   . potassium chloride (K-DUR) 10 MEQ tablet Take 10 mEq by mouth 2 (two) times daily with breakfast and lunch.    . Semaglutide (OZEMPIC, 0.25 OR 0.5 MG/DOSE, [place]) Inject 0.25 mg into the skin every Thursday. Every other week (Dr. Algie [name])   . [DISCONTINUED] levothyroxine (SYNTHROID) 112 MCG tablet Take 1 tablet (112 mcg total) by mouth daily. Monday - Saturday (Patient not taking: No sig reported)    No facility-administered encounter medications on file as of 05/27/2020.    Reviewed chart prior to disease state call. Spoke with patient regarding BP  Recent Office Vitals: BP Readings from Last 3 Encounters:  05/26/20 120/60  05/26/20 120/60  02/02/20 112/70   Pulse Readings from Last 3 Encounters:  05/26/20 (!) 53  05/26/20 (!) 53  02/02/20 80    Wt Readings from Last 3 Encounters:  05/26/20 137 lb 6.4 oz (62.3 kg)  05/26/20 137 lb 6.4 oz (62.3 kg)  02/02/20 140 lb 6.4 oz (63.7 kg)     Kidney Function Lab Results  Component Value Date/Time   CREATININE 1.06 (H) 05/26/2020 11:51 AM   CREATININE 1.11 (H) 01/31/2020 10:44 AM   GFRNONAA 52 (L) 01/31/2020 10:44 AM   GFRAA 55 (L) 09/30/2019 10:48 AM    BMP Latest Ref Rng & Units 05/26/2020 01/31/2020 09/30/2019  Glucose 65 - 99 mg/dL 829(F) 621(H) 98  BUN 8 - 27 mg/dL 10 10 13   Creatinine 0.57 - 1.00 mg/dL ) 0.86(V) 7.84(O)  BUN/Creat Ratio 12 - 28 9(L) - 11(L)   Sodium 134 - 144 mmol/L 140 139 139  Potassium 3.5 - 5.2 mmol/L 4.4 4.4 4.4  Chloride 96 - 106 mmol/L 104 105 103  CO2 20 - 29 mmol/L 22 24 22   Calcium 8.7 - 10.3 mg/dL 9.62(X 9.9    . Current antihypertensive regimen:   Amlodipine 5 mg - take 1/2 tablet daily   Hydralazine 10 mg tablet take 1 tablet by mouth every 8 hours   Isosorbide mononitrate 60 mg taking 1 tablet by mouth daily  Olmesartan 40 mg tablet daily  . How often are you checking your Blood Pressure? daily   . Current home BP readings: 113/56, HR:48.  . What recent interventions/DTPs have been made by any provider to improve Blood Pressure control since last CPP Visit:  - Patient reports she is taking her medications as directed.   . Any recent hospitalizations or ED visits since last visit with CPP? No   . What diet changes have been made to improve Blood Pressure Control?  o Patient reports she eats when is hungry, small portion food. Chicken, chicken pot pie, want to eat veggies but husband doesn't eat it due to his health but usually eats broccoli, sometimes spinach, peas, cabbage.  . What exercise is being done to improve your Blood Pressure Control?  o Patient walks on the treadmill for over 30 minutes daily. Patient reports her daughter suffered a ruptured disc so she sits with her daughter time to time.   Adherence Review: Is the patient currently on ACE/ARB medication? Yes   Does the patient have >5 day gap between last estimated fill dates? No   Star Rating Drugs: Olmesartan 40 MG tablet: #90 DS, last filled on 01/22/20 at [facility]. Ozempic 0.25MG : Received 2 boxes in 05/2020 from Dr. [facility] [name].   05/27/20- I called and spoke with the patient regarding her Repatha Medication. Patient voiced she is experiencing some unexpected delays with receiving her Repatha shots on time due to Pharmacy low stock inventory. The patient is seeking assistance through [employer] to continue medication  compliance without being inconvenienced from her [name]. Notified to the patient I will need to look into this matter and follow up with her. The patient verbalized understanding.  Note:Patient verbalized she missed two days in the last week to check her blood pressure. Patient voiced she is has enough on hand supply of medications but will reach out to me when she has any refill requests.  07/27/20, CPP Notified.  [employer], CMA Clinical Pharmacist Assistant [phone] CCM Total Time: 31 minutes

## 2020-05-28 ENCOUNTER — Other Ambulatory Visit: Payer: Self-pay | Admitting: Internal Medicine

## 2020-05-29 NOTE — Chronic Care Management (AMB) (Signed)
Chronic Care Management Pharmacy Assistant   Name: [name]  MRN: [id number] DOB: [birth year]  Reason for Encounter: Medication Review   05/27/2020- Called patient back to check on status of Repatha and if [facility] has supply for patient to pick up to receive her shot that is due on 05/30/2020. Patient states that [facility] order did not come in, patient plans to call [facility] back on Saturday and Sunday about Repatha. Patient aware I will call her on Monday morning to see if we need to arrange delivery of Repatha to her on 05/30/2020. Onboard form completed.   05/30/2020- Called patient this morning to see if [facility] were able to get her Repatha supply. Patient stated she called [facility] on Saturday and they informed her that Repatha was in process and she could pick up on Sunday, but when she called Sunday prior to going to the pharmacy they told her it was not available and will not be there until Tuesday. Patient aware I am checking with [facility] to see if they have Repatha supply on hand and will call provider to have refill sent, would like to try and get delivery today.   05/31/2020- [facility] has Repatha on supply, requested transfer of refills from [facility] pharmacy of Repatha to [facility]. Patient notified that she will be able to get her medication today, patient stated she had a funeral today at 1 pm but should be back on between 3pm and 4 pm. Patient aware I will relay the time of delivery to the pharmacy. Received message from pharmacy that patient's medication will be $47 and they were aware of delivery time but unable to update patient on price.  Patient called back around 3 pm stating she received message from pharmacy but questioned price of Repatha, she states she does not pay anything at [facility] for this medications. Patient will receive delivery from [facility] this time, informed patient I will inquire about a copay card to alleviate copay on  medication.  Spoke with [name], CPP the copay card are good for those patient's without Medicare and if patient is on an Low Income Subsidy program, we will not be able to waive that copay.  06/01/2020- Called patient to update on this information, no answer, left message to return call.     Medications: Outpatient Encounter Medications as of 05/27/2020  Medication Sig Note   albuterol (PROVENTIL HFA;VENTOLIN HFA) 108 (90 Base) MCG/ACT inhaler Inhale 2 puffs into the lungs every 6 (six) hours as needed for wheezing or shortness of breath. (Patient not taking: No sig reported)    amLODipine (NORVASC) 5 MG tablet Take 5 mg by mouth at bedtime. 1/2 tab (Dr. Algie Coffer)    aspirin EC 81 MG tablet Take 81 mg by mouth daily. 01/31/2020: Patient would not disclose   b complex vitamins tablet Take 1 tablet by mouth daily with lunch.  01/31/2020: Patient would not disclose   bisacodyl (DULCOLAX) 5 MG EC tablet Take 5 mg by mouth daily as needed for moderate constipation.  01/31/2020: Patient would not disclose   Calcium Carb-Cholecalciferol (CALCIUM + D3 PO) Take 1 tablet by mouth at bedtime. 01/31/2020: Patient would not disclose   cholecalciferol (VITAMIN D) 25 MCG (1000 UNIT) tablet Take 1 tablet by mouth daily.  01/31/2020: Patient would not disclose   cilostazol (PLETAL) 100 MG tablet Take 1 tablet (100 mg total) by mouth daily.    Evolocumab (REPATHA SURECLICK) 140 MG/ML SOAJ Inject 140 mg into the skin  every 14 (fourteen) days.    furosemide (LASIX) 20 MG tablet Take 1 tablet (20 mg total) by mouth daily.    hydrALAZINE (APRESOLINE) 10 MG tablet Take 1 tablet (10 mg total) by mouth every 8 (eight) hours. (Patient taking differently: Take 10 mg by mouth 3 (three) times daily with meals.)    isosorbide mononitrate (IMDUR) 60 MG 24 hr tablet TAKE 1 TABLET EVERY DAY    levothyroxine (SYNTHROID) 100 MCG tablet Take 1 tablet (100 mcg total) by mouth daily before breakfast. Monday - Saturday     Magnesium 250 MG TABS Take 1 tablet (250 mg total) by mouth at bedtime.    Multiple Vitamins-Minerals (EMERGEN-C IMMUNE PO) Take 1 packet by mouth daily as needed (TO BOOST THE IMMUNE SYSTEM).    Multiple Vitamins-Minerals (MULTIVITAMIN WITH MINERALS) tablet Take 1 tablet by mouth daily with lunch.     nitroGLYCERIN (NITROSTAT) 0.4 MG SL tablet Place 1 tablet (0.4 mg total) under the tongue every 5 (five) minutes x 3 doses as needed for chest pain.    olmesartan (BENICAR) 40 MG tablet Take 1 tablet (40 mg total) by mouth daily.    potassium chloride (K-DUR) 10 MEQ tablet Take 10 mEq by mouth 2 (two) times daily with breakfast and lunch.     Semaglutide (OZEMPIC, 0.25 OR 0.5 MG/DOSE, [place]) Inject 0.25 mg into the skin every Thursday. Every other week (Dr. Algie Coffer)    [DISCONTINUED] levothyroxine (SYNTHROID) 112 MCG tablet Take 1 tablet (112 mcg total) by mouth daily. Monday - Saturday (Patient not taking: No sig reported)    No facility-administered encounter medications on file as of 05/27/2020.    Star Rating Drugs:Olmesartan 40 mg- last filled 01/22/20 90 day supply with [facility].   SIG: Billee Cashing, CMA Clinical Pharmacist Assistant [phone]

## 2020-05-30 ENCOUNTER — Telehealth: Payer: Medicare PPO

## 2020-05-30 ENCOUNTER — Telehealth: Payer: Self-pay

## 2020-05-30 ENCOUNTER — Other Ambulatory Visit: Payer: Self-pay

## 2020-05-30 NOTE — Telephone Encounter (Signed)
Your hba1c is 6.0. This is stable.   Your TSH is still too low. We need to decrease your thyroid medication to daily. You may come get samples on Monday. Please give [place] a call and we will see if they are available. I would like to recheck your labs 7 weeks AFTER you start the new medication. I suggest you skip Sunday's dose of thyroid meds.   Your kidney function is in stage 3 chronic kidney disease range. Be sure to stay well hydrated. Your liver enzymes are elevated too. How often are you taking over the counter meds like Tylenol, Advil, ibuprofen etc? Some of these meds can also affect your kidney function.   Please let me know if you have any questions.  THE PATIENT SAID THAT SHE HASN'T TAKEN ANY TYLENOL, ADVIL, ETC SINCE LAST YEAR AND SHE WOLD ONLY TAKE IT IF SHE HAS A HEADACHE, SHE WAS RARELY TAKING ANY.

## 2020-05-30 NOTE — Telephone Encounter (Signed)
  Chronic Care Management   Outreach Note  05/30/2020 Name: [name] MRN: [id number] DOB: 1945/10/31  Referred by: [name], MD Reason for referral : Chronic Care Management (RN CM FU Call Attempt )   An unsuccessful telephone outreach was attempted today. The patient was referred to the case management team for assistance with care management and care coordination.   Follow Up Plan: Telephone follow up appointment with care management team member scheduled for: 06/22/20  Delsa Sale, RN, BSN, CCM Care Management Coordinator [facility] Care Management/[facility]  Direct Phone: [phone]

## 2020-06-01 DIAGNOSIS — E1165 Type 2 diabetes mellitus with hyperglycemia: Secondary | ICD-10-CM | POA: Diagnosis not present

## 2020-06-01 DIAGNOSIS — Z794 Long term (current) use of insulin: Secondary | ICD-10-CM | POA: Diagnosis not present

## 2020-06-01 DIAGNOSIS — E119 Type 2 diabetes mellitus without complications: Secondary | ICD-10-CM | POA: Diagnosis not present

## 2020-06-11 ENCOUNTER — Other Ambulatory Visit: Payer: Self-pay | Admitting: Internal Medicine

## 2020-06-14 ENCOUNTER — Encounter: Payer: Self-pay | Admitting: Internal Medicine

## 2020-06-15 ENCOUNTER — Encounter: Payer: Self-pay | Admitting: Internal Medicine

## 2020-06-16 ENCOUNTER — Other Ambulatory Visit: Payer: Self-pay | Admitting: Internal Medicine

## 2020-06-22 ENCOUNTER — Telehealth: Payer: Self-pay

## 2020-06-22 ENCOUNTER — Telehealth: Payer: Medicare PPO

## 2020-06-22 NOTE — Telephone Encounter (Signed)
  Chronic Care Management   Outreach Note  06/22/2020 Name: [name] MRN: [id number] DOB: 04-Aug-1945  Referred by: [name], MD Reason for referral : Chronic Care Management (RN CM FU Call Attempt)   A second unsuccessful telephone outreach was attempted today. The patient was referred to the case management team for assistance with care management and care coordination.   Follow Up Plan: A HIPAA compliant phone message was left for the patient providing contact information and requesting a return call. Telephone follow up appointment with care management team member scheduled for: 07/27/20  Delsa Sale, RN, BSN, CCM Care Management Coordinator [facility] Care Management/[facility]  Direct Phone: [phone]

## 2020-06-28 ENCOUNTER — Telehealth: Payer: Self-pay

## 2020-06-28 NOTE — Chronic Care Management (AMB) (Signed)
Chronic Care Management Pharmacy Assistant   Name: Barbarita Hutmacher  MRN: [id number] DOB: 19-May-1945  Reason for Encounter: Medication Review/ Medication Coordination Call   Recent office visits:  None  Recent consult visits:  None  Hospital visits:  None in previous 6 months   Reviewed chart for medication changes ahead of medication coordination call.  No OVs, Consults, or hospital visits since last care coordination call/Pharmacist visit.  Medication changes indicated: 05/28/2020- Levothyroxine 112 mcg discontinued (orders only) per telephone call 05/30/2020 patient to start Levothyroxine 88 mcg- 1 tablet before breakfast daily.    BP Readings from Last 3 Encounters:  05/26/20 120/60  05/26/20 120/60  02/02/20 112/70    Lab Results  Component Value Date   HGBA1C 6.0 (H) 05/26/2020     Patient obtains medications through [facility]  90 Days   Last adherence delivery included:  Repatha 140 mg/ml- Inject 140 mg into skin every 14 days.  Patient did not decline any medications last month, she was onboarded for one medication only.    Patient is due for next adherence delivery on: 06/26/2020. Called patient and reviewed medications and coordinated delivery.  This delivery to include: None- Patient would like Repatha [facility] back to [facility] due to copay price. Patient does not pay a copay at [facility] and [facility] copay is $47. [facility] to transfer remaining refills back to [facility]. Called [facility] to see if they have Repatha in stock, spoke with Mathis, they do not have in stock, will have to order once prescription is received and they will notify patient when medication arrives which could take 1 day.   No short fill or acute fill needed.  Patient declined the following medications: Repatha 140 mg/ml- Inject 140 mg into skin every 14 days, due to cost.   Patient needs refills for: None.  Patient aware that she contact me if she would like to use  [facility] again or if she has any problems or concerns.   Medications: Outpatient Encounter Medications as of 06/28/2020  Medication Sig Note  . albuterol (PROVENTIL HFA;VENTOLIN HFA) 108 (90 Base) MCG/ACT inhaler Inhale 2 puffs into the lungs every 6 (six) hours as needed for wheezing or shortness of breath. (Patient not taking: No sig reported)   . amLODipine (NORVASC) 5 MG tablet Take 5 mg by mouth at bedtime. 1/2 tab (Dr. Algie Coffer)   . aspirin EC 81 MG tablet Take 81 mg by mouth daily. 01/31/2020: Patient would not disclose  . b complex vitamins tablet Take 1 tablet by mouth daily with lunch.  01/31/2020: Patient would not disclose  . bisacodyl (DULCOLAX) 5 MG EC tablet Take 5 mg by mouth daily as needed for moderate constipation.  01/31/2020: Patient would not disclose  . Calcium Carb-Cholecalciferol (CALCIUM + D3 PO) Take 1 tablet by mouth at bedtime. 01/31/2020: Patient would not disclose  . cholecalciferol (VITAMIN D) 25 MCG (1000 UNIT) tablet Take 1 tablet by mouth daily.  01/31/2020: Patient would not disclose  . cilostazol (PLETAL) 100 MG tablet Take 1 tablet (100 mg total) by mouth daily.   . Evolocumab (REPATHA SURECLICK) 140 MG/ML SOAJ Inject 140 mg into the skin every 14 (fourteen) days.   . furosemide (LASIX) 20 MG tablet TAKE 1 TABLET (20 MG TOTAL) BY MOUTH DAILY.   . hydrALAZINE (APRESOLINE) 10 MG tablet Take 1 tablet (10 mg total) by mouth every 8 (eight) hours. (Patient taking differently: Take 10 mg by mouth 3 (three) times daily with meals.)   .  isosorbide mononitrate (IMDUR) 60 MG 24 hr tablet TAKE 1 TABLET EVERY DAY   . levothyroxine (SYNTHROID) 100 MCG tablet Take 1 tablet (100 mcg total) by mouth daily before breakfast. Monday - Saturday   . levothyroxine (SYNTHROID) 88 MCG tablet Take 88 mcg by mouth daily before breakfast. 1 tab daily   . Magnesium 250 MG TABS Take 1 tablet (250 mg total) by mouth at bedtime.   . Multiple Vitamins-Minerals (EMERGEN-C IMMUNE PO) Take 1 packet by  mouth daily as needed (TO BOOST THE IMMUNE SYSTEM).   . Multiple Vitamins-Minerals (MULTIVITAMIN WITH MINERALS) tablet Take 1 tablet by mouth daily with lunch.    . nitroGLYCERIN (NITROSTAT) 0.4 MG SL tablet Place 1 tablet (0.4 mg total) under the tongue every 5 (five) minutes x 3 doses as needed for chest pain.   [name] olmesartan (BENICAR) 40 MG tablet TAKE 1 TABLET EVERY DAY   . potassium chloride (K-DUR) 10 MEQ tablet Take 10 mEq by mouth 2 (two) times daily with breakfast and lunch.    . Semaglutide (OZEMPIC, 0.25 OR 0.5 MG/DOSE, [place]) Inject 0.25 mg into the skin every Thursday. Every other week (Dr. Algie Coffer)    No facility-administered encounter medications on file as of 06/28/2020.    Star Rating Drugs: Olmesartan 40 mg- Last filled 01/22/2020 for 90 day supply at [facility]. Rosuvastatin 20 mg- Last filled 12/05/2018 for 84 day supply at [facility]. Ozempic- Last filled 06/19/2018 for 84 day supply at [facility].    Notes: DTPs: Patient missed Repatha dosage on Monday, she contacted [facility] on Sunday, but they no longer had a prescription.   SIG: Billee Cashing, CMA Clinical Pharmacist Assistant [phone]

## 2020-06-30 ENCOUNTER — Telehealth: Payer: Self-pay | Admitting: Cardiovascular Disease

## 2020-06-30 ENCOUNTER — Ambulatory Visit (INDEPENDENT_AMBULATORY_CARE_PROVIDER_SITE_OTHER): Payer: Medicare PPO

## 2020-06-30 DIAGNOSIS — E1122 Type 2 diabetes mellitus with diabetic chronic kidney disease: Secondary | ICD-10-CM | POA: Diagnosis not present

## 2020-06-30 DIAGNOSIS — N182 Chronic kidney disease, stage 2 (mild): Secondary | ICD-10-CM | POA: Diagnosis not present

## 2020-06-30 DIAGNOSIS — I10 Essential (primary) hypertension: Secondary | ICD-10-CM

## 2020-06-30 MED ORDER — REPATHA SURECLICK 140 MG/ML ~~LOC~~ SOAJ: 140.0000 mg | SUBCUTANEOUS | 3 refills | Status: DC

## 2020-06-30 NOTE — Chronic Care Management (AMB) (Signed)
Chronic Care Management    Social Work Note  06/30/2020 Name: [name] MRN: [id number] DOB: 1946/03/02  [name] is a 75 y.o. year old female who is a primary care patient of [name], MD. The CCM team was consulted to assist the patient with chronic disease management and/or care coordination needs related to: [facility] .   Engaged with patient by telephone for follow up visit in response to provider referral for social work chronic care management and care coordination services.   Consent to Services:  The patient was given information about Chronic Care Management services, agreed to services, and gave verbal consent prior to initiation of services.  Please see initial visit note for detailed documentation.   Patient agreed to services and consent obtained.   Assessment: Review of patient past medical history, allergies, medications, and health status, including review of relevant consultants reports was performed today as part of a comprehensive evaluation and provision of chronic care management and care coordination services.     SDOH (Social Determinants of Health) assessments and interventions performed:    Advanced Directives Status: Not addressed in this encounter.  CCM Care Plan  Allergies  Allergen Reactions  . Atorvastatin Swelling and Other (See Comments)    Limbs swell  . Clopidogrel Other (See Comments)    Caused bruising  . Metoprolol Other (See Comments)    Slows heart rate too low  . Metoprolol Tartrate Swelling and Other (See Comments)    Site of swelling not noted  . Pregabalin Swelling and Other (See Comments)    Site of swelling not noted  . Rosuvastatin Calcium Other (See Comments)    Unknown reaction    Outpatient Encounter Medications as of 06/30/2020  Medication Sig Note  . albuterol (PROVENTIL HFA;VENTOLIN HFA) 108 (90 Base) MCG/ACT inhaler Inhale 2 puffs into the lungs every 6 (six) hours as needed for wheezing  or shortness of breath. (Patient not taking: No sig reported)   . amLODipine (NORVASC) 5 MG tablet Take 5 mg by mouth at bedtime. 1/2 tab (Dr. Algie Coffer)   . aspirin EC 81 MG tablet Take 81 mg by mouth daily. 01/31/2020: Patient would not disclose  . b complex vitamins tablet Take 1 tablet by mouth daily with lunch.  01/31/2020: Patient would not disclose  . bisacodyl (DULCOLAX) 5 MG EC tablet Take 5 mg by mouth daily as needed for moderate constipation.  01/31/2020: Patient would not disclose  . Calcium Carb-Cholecalciferol (CALCIUM + D3 PO) Take 1 tablet by mouth at bedtime. 01/31/2020: Patient would not disclose  . cholecalciferol (VITAMIN D) 25 MCG (1000 UNIT) tablet Take 1 tablet by mouth daily.  01/31/2020: Patient would not disclose  . cilostazol (PLETAL) 100 MG tablet Take 1 tablet (100 mg total) by mouth daily.   . furosemide (LASIX) 20 MG tablet TAKE 1 TABLET (20 MG TOTAL) BY MOUTH DAILY.   . hydrALAZINE (APRESOLINE) 10 MG tablet Take 1 tablet (10 mg total) by mouth every 8 (eight) hours. (Patient taking differently: Take 10 mg by mouth 3 (three) times daily with meals.)   . isosorbide mononitrate (IMDUR) 60 MG 24 hr tablet TAKE 1 TABLET EVERY DAY   . levothyroxine (SYNTHROID) 100 MCG tablet Take 1 tablet (100 mcg total) by mouth daily before breakfast. Monday - Saturday   . levothyroxine (SYNTHROID) 88 MCG tablet Take 88 mcg by mouth daily before breakfast. 1 tab daily   . Magnesium 250 MG TABS Take 1 tablet (250 mg total)  by mouth at bedtime.   . Multiple Vitamins-Minerals (EMERGEN-C IMMUNE PO) Take 1 packet by mouth daily as needed (TO BOOST THE IMMUNE SYSTEM).   . Multiple Vitamins-Minerals (MULTIVITAMIN WITH MINERALS) tablet Take 1 tablet by mouth daily with lunch.    . nitroGLYCERIN (NITROSTAT) 0.4 MG SL tablet Place 1 tablet (0.4 mg total) under the tongue every 5 (five) minutes x 3 doses as needed for chest pain.   [name] olmesartan (BENICAR) 40 MG tablet TAKE 1 TABLET EVERY DAY   . potassium  chloride (K-DUR) 10 MEQ tablet Take 10 mEq by mouth 2 (two) times daily with breakfast and lunch.    . Semaglutide (OZEMPIC, 0.25 OR 0.5 MG/DOSE, [place]) Inject 0.25 mg into the skin every Thursday. Every other week (Dr. Algie Coffer)    No facility-administered encounter medications on file as of 06/30/2020.    Patient Active Problem List   Diagnosis Date Noted  . Hyperlipidemia 10/30/2019  . Statin myopathy 10/30/2019  . Acute bronchitis 06/06/2018  . Type 2 diabetes mellitus with stage 2 chronic kidney disease, without long-term current use of insulin (HCC) 02/17/2018  . Chronic renal disease, stage II 02/17/2018  . Benign hypertensive heart and renal disease 02/17/2018  . Primary hypothyroidism 02/17/2018  . Localized osteoarthritis of right knee 02/17/2018  . Essential hypertension 06/12/2017  . Acute coronary syndrome (HCC) 12/26/2016  . Hypertensive heart disease without heart failure 10/06/2015  . Insomnia with sleep apnea 10/06/2015  . OSA on CPAP 10/06/2015  . 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 CAD in native artery 10/06/2015  . Chest pain at rest 07/18/2015    Class: Acute  . Weakness 07/18/2015    Class: Acute  . Insomnia 05/05/2015  . Primary snoring 05/05/2015    Conditions to be addressed/monitored: HTN, DMII and CKD Stage II  Care Plan : Social Work [facility] Care Plan  Updates made by [name] since 06/30/2020 12:00 AM    Problem: Barriers to Treatment     Long-Range Goal: Barriers to Treatment Identified and Managed   Start Date: 05/03/2020  Expected End Date: 08/31/2020  Priority: Low  Note:   Current Barriers:  . Chronic disease management support and education needs related to HTN, DM, and CKD Stage II    Social Worker Clinical Goal(s):  [name] Over the next 120 days, patient will work with SW to identify and address any acute and/or chronic care coordination needs related to the self health management of HTN, DM, and CKD Stage II    CCM SW Interventions:   . Inter-disciplinary care team collaboration (see longitudinal plan of care) . Collaboration with [name], MD regarding development and update of comprehensive plan of care as evidenced by provider attestation and co-signature . Successful outbound call placed to the patient to assist with care coordination needs . Discussed the patient did get her Repatha last month from the [facility] - unfortunately this pharmacy is out of the patients network and she had to pay a co-pay . Determined the patient is currently out of Repatha with last dose due this past Monday and was informed [facility] needed a new prescription . Discussed the patient does not intend to pay the co-pay for her medication from [facility] . Advised the patient to contact the prescribing physician to request a refill - patient stated understanding . Scheduled follow up call over the next 14 days  Patient Goals/Self-Care Activities . Over the next 14 days, patient will:   - Patient will self  administer medications as prescribed Patient will attend all scheduled provider appointments Patient will call pharmacy for medication refills Patient will call provider office for new concerns or questions Contact SW as needed prior to next scheduled call Follow Up Plan:  SW will follow up with the patient over the next 14 days       Follow Up Plan: SW will follow up with patient by phone over the next 14 days      [name], BSW, CDP Social Worker, Certified Dementia Practitioner [facility] Care Management [phone]  Total time spent performing care coordination and/or care management activities with the patient by phone or face to face = 24 minutes.

## 2020-06-30 NOTE — Progress Notes (Addendum)
06/30/2020- Patient requested a return call through PCP office, called patient she stated she still did not have her Repatha medication and did not hear from [facility]. Patient was placed on hold and I called [facility], spoke with pharmacist and they have Repatha ready for patient to pick up. Patient informed, she is aware to take her Repatha injection today and another in 14 days. Patient notified that her Cardiologist sent a refill into [facility] of Repatha again but we can just keep on hold for a later time. Patient agrees with this plan. Patient very thankful for the help in getting her medication. [name], CPP notified.   Billee Cashing, CMA Clinical Pharmacist Assistant [phone]  I have reviewed the care management and care coordination activities outlined in this encounter and I am certifying that I agree with the content of this note. No further action required.  Harlan Stains, [facility] 07/21/20 10:56 AM

## 2020-06-30 NOTE — Telephone Encounter (Signed)
*  STAT* If patient is at the pharmacy, call can be transferred to refill team.   1. Which medications need to be refilled? (please list name of each medication and dose if known)   Evolocumab (REPATHA SURECLICK) 140 MG/ML SOAJ     2. Which pharmacy/location (including street and city if local pharmacy) is medication to be sent to? [facility]  3. Do they need a 30 day or 90 day supply? 90 day supply  Patient states she is completely out of medication. She states she missed her injection on Monday, 06/27/20.

## 2020-06-30 NOTE — Patient Instructions (Signed)
Social Worker Visit Information  Goals we discussed today:  Goals Addressed            This Visit's Progress   . Work with SW to manage care coordination needs       Timeframe:  Long-Range Goal Priority:  Low Start Date:  2.8.22                           Expected End Date:   6.8.22                    Next planned outreach: 4.13.22  Patient Goals/Self-Care Activities . Over the next 14 days, patient will:   - Patient will self administer medications as prescribed Patient will attend all scheduled provider appointments Patient will call pharmacy for medication refills Patient will call provider office for new concerns or questions Contact SW as needed prior to next scheduled call        Follow Up Plan: SW will follow up with patient by phone over the next 14 days   [name], BSW, CDP Social Worker, Certified Dementia Practitioner [facility] Care Management [phone]

## 2020-06-30 NOTE — Telephone Encounter (Signed)
repatha rx [facility]

## 2020-07-01 ENCOUNTER — Telehealth: Payer: Medicare PPO

## 2020-07-04 ENCOUNTER — Other Ambulatory Visit: Payer: Self-pay | Admitting: Internal Medicine

## 2020-07-04 ENCOUNTER — Other Ambulatory Visit: Payer: Self-pay

## 2020-07-04 ENCOUNTER — Other Ambulatory Visit: Payer: Self-pay | Admitting: Cardiovascular Disease

## 2020-07-04 MED ORDER — LEVOTHYROXINE SODIUM 88 MCG PO TABS: 88.0000 ug | ORAL_TABLET | Freq: Every day | ORAL | 1 refills | Status: DC

## 2020-07-06 ENCOUNTER — Telehealth: Payer: Self-pay

## 2020-07-06 ENCOUNTER — Telehealth: Payer: Medicare PPO

## 2020-07-06 NOTE — Telephone Encounter (Signed)
  Chronic Care Management   Outreach Note  07/06/2020 Name: [name] MRN: [id number] DOB: [birth year]  Referred by: [name], MD Reason for referral : Chronic Care Management (Unsuccessful call)   An unsuccessful telephone outreach was attempted today. The patient was referred to the case management team for assistance with care management and care coordination.   Follow Up Plan: A HIPAA compliant phone message was left for the patient providing contact information and requesting a return call.  The care management team will reach out to the patient again over the next 14 days.   [name], BSW, CDP Social Worker, Certified Dementia Practitioner [facility] Care Management [phone]

## 2020-07-15 ENCOUNTER — Ambulatory Visit: Payer: Medicare PPO

## 2020-07-15 DIAGNOSIS — E1122 Type 2 diabetes mellitus with diabetic chronic kidney disease: Secondary | ICD-10-CM

## 2020-07-15 DIAGNOSIS — I10 Essential (primary) hypertension: Secondary | ICD-10-CM

## 2020-07-15 DIAGNOSIS — N182 Chronic kidney disease, stage 2 (mild): Secondary | ICD-10-CM

## 2020-07-15 NOTE — Chronic Care Management (AMB) (Signed)
Chronic Care Management    Social Work Note  07/15/2020 Name: [name] MRN: [id number] DOB: 04/23/1945  [name] is a 75 y.o. year old female who is a primary care patient of [name], MD. The CCM team was consulted to assist the patient with chronic disease management and/or care coordination needs related to: [facility] .   Engaged with patient by telephone for follow up visit in response to provider referral for social work chronic care management and care coordination services.   Consent to Services:  The patient was given information about Chronic Care Management services, agreed to services, and gave verbal consent prior to initiation of services.  Please see initial visit note for detailed documentation.   Patient agreed to services and consent obtained.   Assessment: Review of patient past medical history, allergies, medications, and health status, including review of relevant consultants reports was performed today as part of a comprehensive evaluation and provision of chronic care management and care coordination services.     SDOH (Social Determinants of Health) assessments and interventions performed:    Advanced Directives Status: Not addressed in this encounter.  CCM Care Plan  Allergies  Allergen Reactions  . Atorvastatin Swelling and Other (See Comments)    Limbs swell  . Clopidogrel Other (See Comments)    Caused bruising  . Metoprolol Other (See Comments)    Slows heart rate too low  . Metoprolol Tartrate Swelling and Other (See Comments)    Site of swelling not noted  . Pregabalin Swelling and Other (See Comments)    Site of swelling not noted  . Rosuvastatin Calcium Other (See Comments)    Unknown reaction    Outpatient Encounter Medications as of 07/15/2020  Medication Sig Note  . albuterol (PROVENTIL HFA;VENTOLIN HFA) 108 (90 Base) MCG/ACT inhaler Inhale 2 puffs into the lungs every 6 (six) hours as needed for  wheezing or shortness of breath. (Patient not taking: No sig reported)   . amLODipine (NORVASC) 5 MG tablet Take 5 mg by mouth at bedtime. 1/2 tab (Dr. Algie Coffer)   . aspirin EC 81 MG tablet Take 81 mg by mouth daily. 01/31/2020: Patient would not disclose  . b complex vitamins tablet Take 1 tablet by mouth daily with lunch.  01/31/2020: Patient would not disclose  . bisacodyl (DULCOLAX) 5 MG EC tablet Take 5 mg by mouth daily as needed for moderate constipation.  01/31/2020: Patient would not disclose  . Calcium Carb-Cholecalciferol (CALCIUM + D3 PO) Take 1 tablet by mouth at bedtime. 01/31/2020: Patient would not disclose  . cholecalciferol (VITAMIN D) 25 MCG (1000 UNIT) tablet Take 1 tablet by mouth daily.  01/31/2020: Patient would not disclose  . cilostazol (PLETAL) 100 MG tablet TAKE 1 TABLET EVERY DAY   . Evolocumab (REPATHA SURECLICK) 140 MG/ML SOAJ Inject 140 mg into the skin every 14 (fourteen) days.   . furosemide (LASIX) 20 MG tablet TAKE 1 TABLET (20 MG TOTAL) BY MOUTH DAILY.   . hydrALAZINE (APRESOLINE) 10 MG tablet Take 1 tablet (10 mg total) by mouth every 8 (eight) hours. (Patient taking differently: Take 10 mg by mouth 3 (three) times daily with meals.)   . isosorbide mononitrate (IMDUR) 60 MG 24 hr tablet TAKE 1 TABLET EVERY DAY   . levothyroxine (SYNTHROID) 88 MCG tablet Take 1 tablet (88 mcg total) by mouth daily before breakfast. 1 tab daily   . Magnesium 250 MG TABS Take 1 tablet (250 mg total) by mouth at bedtime.   [name]  Multiple Vitamins-Minerals (EMERGEN-C IMMUNE PO) Take 1 packet by mouth daily as needed (TO BOOST THE IMMUNE SYSTEM).   . Multiple Vitamins-Minerals (MULTIVITAMIN WITH MINERALS) tablet Take 1 tablet by mouth daily with lunch.    . nitroGLYCERIN (NITROSTAT) 0.4 MG SL tablet Place 1 tablet (0.4 mg total) under the tongue every 5 (five) minutes x 3 doses as needed for chest pain.   [name] olmesartan (BENICAR) 40 MG tablet TAKE 1 TABLET EVERY DAY   . potassium chloride (K-DUR)  10 MEQ tablet Take 10 mEq by mouth 2 (two) times daily with breakfast and lunch.    . Semaglutide (OZEMPIC, 0.25 OR 0.5 MG/DOSE, Davy) Inject 0.25 mg into the skin every Thursday. Every other week (Dr. Algie Coffer)    No facility-administered encounter medications on file as of 07/15/2020.    Patient Active Problem List   Diagnosis Date Noted  . Hyperlipidemia 10/30/2019  . Statin myopathy 10/30/2019  . Acute bronchitis 06/06/2018  . Type 2 diabetes mellitus with stage 2 chronic kidney disease, without long-term current use of insulin (HCC) 02/17/2018  . Chronic renal disease, stage II 02/17/2018  . Benign hypertensive heart and renal disease 02/17/2018  . Primary hypothyroidism 02/17/2018  . Localized osteoarthritis of right knee 02/17/2018  . Essential hypertension 06/12/2017  . Acute coronary syndrome (HCC) 12/26/2016  . Hypertensive heart disease without heart failure 10/06/2015  . Insomnia with sleep apnea 10/06/2015  . OSA on CPAP 10/06/2015  . 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 CAD in native artery 10/06/2015  . Chest pain at rest 07/18/2015    Class: Acute  . Weakness 07/18/2015    Class: Acute  . Insomnia 05/05/2015  . Primary snoring 05/05/2015    Conditions to be addressed/monitored: HTN, DMII and CKD Stage II; Care Coordination  Care Plan : Social Work [facility] Care Plan  Updates made by [name] since 07/15/2020 12:00 AM    Problem: Barriers to Treatment     Long-Range Goal: Barriers to Treatment Identified and Managed   Start Date: 05/03/2020  Expected End Date: 08/31/2020  This Visit's Progress: On track  Priority: Low  Note:   Current Barriers:  . Chronic disease management support and education needs related to HTN, DM, and CKD Stage II    Social Worker Clinical Goal(s):  [name] Over the next 120 days, patient will work with SW to identify and address any acute and/or chronic care coordination needs related to the self health management of HTN, DM, and CKD  Stage II    CCM SW Interventions:  . Inter-disciplinary care team collaboration (see longitudinal plan of care) . Collaboration with [name], MD regarding development and update of comprehensive plan of care as evidenced by provider attestation and co-signature . Successful outbound call placed to the patient to assess for care coordination needs . Discussed the patient did obtain her Repatha from [employer] . Patient plans to continue using [facility] for this medication . Assessed for SW resources needs - none identified at this time . Scheduled follow up call over the 90 days  Patient Goals/Self-Care Activities . Over the next 90 days, patient will:   - Patient will self administer medications as prescribed Patient will attend all scheduled provider appointments Patient will call pharmacy for medication refills Patient will call provider office for new concerns or questions Contact SW as needed prior to next scheduled call Follow Up Plan:  SW will follow up with the patient over the next 90 days  Follow Up Plan: SW will follow up with patient by phone over the next 90 days.      [name], BSW, CDP Social Worker, Certified Dementia Practitioner [facility] Care Management [phone]  Total time spent performing care coordination and/or care management activities with the patient by phone or face to face = 15 minutes.

## 2020-07-15 NOTE — Patient Instructions (Signed)
Social Worker Visit Information  Goals we discussed today:  Goals Addressed            This Visit's Progress   . Work with SW to manage care coordination needs       Timeframe:  Long-Range Goal Priority:  Low Start Date:  2.8.22                           Expected End Date:   6.8.22                    Next planned outreach: 7.13.22  Patient Goals/Self-Care Activities . Over the next 90 days, patient will:   - Patient will self administer medications as prescribed Patient will attend all scheduled provider appointments Patient will call pharmacy for medication refills Patient will call provider office for new concerns or questions Contact SW as needed prior to next scheduled call        Follow Up Plan: SW will follow up with patient by phone over the next 90 days.   [name], BSW, CDP Social Worker, Certified Dementia Practitioner [facility] Care Management [phone]

## 2020-07-18 ENCOUNTER — Other Ambulatory Visit: Payer: Self-pay

## 2020-07-18 ENCOUNTER — Other Ambulatory Visit: Payer: Medicare PPO

## 2020-07-27 ENCOUNTER — Telehealth: Payer: Medicare PPO

## 2020-07-27 DIAGNOSIS — R634 Abnormal weight loss: Secondary | ICD-10-CM | POA: Diagnosis not present

## 2020-07-27 DIAGNOSIS — E119 Type 2 diabetes mellitus without complications: Secondary | ICD-10-CM | POA: Diagnosis not present

## 2020-07-27 DIAGNOSIS — E7849 Other hyperlipidemia: Secondary | ICD-10-CM | POA: Diagnosis not present

## 2020-07-27 DIAGNOSIS — E034 Atrophy of thyroid (acquired): Secondary | ICD-10-CM | POA: Diagnosis not present

## 2020-07-27 DIAGNOSIS — R42 Dizziness and giddiness: Secondary | ICD-10-CM | POA: Diagnosis not present

## 2020-07-27 DIAGNOSIS — M25561 Pain in right knee: Secondary | ICD-10-CM | POA: Diagnosis not present

## 2020-07-27 DIAGNOSIS — I2510 Atherosclerotic heart disease of native coronary artery without angina pectoris: Secondary | ICD-10-CM | POA: Diagnosis not present

## 2020-07-27 DIAGNOSIS — R072 Precordial pain: Secondary | ICD-10-CM | POA: Diagnosis not present

## 2020-08-02 DIAGNOSIS — Z794 Long term (current) use of insulin: Secondary | ICD-10-CM | POA: Diagnosis not present

## 2020-08-02 DIAGNOSIS — E1165 Type 2 diabetes mellitus with hyperglycemia: Secondary | ICD-10-CM | POA: Diagnosis not present

## 2020-08-02 DIAGNOSIS — E119 Type 2 diabetes mellitus without complications: Secondary | ICD-10-CM | POA: Diagnosis not present

## 2020-08-08 ENCOUNTER — Telehealth: Payer: Self-pay

## 2020-08-08 ENCOUNTER — Telehealth: Payer: Medicare PPO

## 2020-08-08 NOTE — Chronic Care Management (AMB) (Signed)
Called patient for an appointment reminder with [name], RPH on 08-09-2020 at 11:00. Instructed patient to have all meds/supplements and logs available for review. Patient voiced understanding.  Malecca Willa Rough [facility] Concierge  [phone]

## 2020-08-09 ENCOUNTER — Ambulatory Visit (INDEPENDENT_AMBULATORY_CARE_PROVIDER_SITE_OTHER): Payer: Medicare PPO

## 2020-08-09 DIAGNOSIS — I10 Essential (primary) hypertension: Secondary | ICD-10-CM | POA: Diagnosis not present

## 2020-08-09 DIAGNOSIS — E782 Mixed hyperlipidemia: Secondary | ICD-10-CM

## 2020-08-09 DIAGNOSIS — R0789 Other chest pain: Secondary | ICD-10-CM

## 2020-08-09 NOTE — Progress Notes (Signed)
Chronic Care Management Pharmacy Note  08/18/2020 Name:  [name] MRN:  [id number] DOB:  Jun 21, 1945  Subjective: [name] is an 75 y.o. year old female who is a primary patient of Glendale Chard, MD.  The CCM team was consulted for assistance with disease management and care coordination needs.    Engaged with patient by telephone for follow up visit in response to provider referral for pharmacy case management and/or care coordination services.   Consent to Services:  The patient was given information about Chronic Care Management services, agreed to services, and gave verbal consent prior to initiation of services.  Please see initial visit note for detailed documentation.   Patient Care Team: Glendale Chard, MD as PCP - General (Internal Medicine) Marylynn Elias Dennington, MD as Consulting Physician (Ophthalmology) Daneen Schick as Social Worker [place], Claudette Stapler, RN as Case Manager Mayford Knife, [facility] (Pharmacist)  Recent office visits: 05/26/2020 PCP OV   Recent consult visits: 07/27/2020 - [place]  Hospital visits: None in previous 6 months  Objective:  Lab Results  Component Value Date   CREATININE 1.06 (H) 05/26/2020   BUN 10 05/26/2020   GFRNONAA 52 (L) 01/31/2020   GFRAA 55 (L) 09/30/2019   NA 140 05/26/2020   K 4.4 05/26/2020   CALCIUM 10.3 05/26/2020   CO2 22 05/26/2020   GLUCOSE 100 (H) 05/26/2020    Lab Results  Component Value Date/Time   HGBA1C 6.0 (H) 05/26/2020 11:51 AM   HGBA1C 5.7 (H) 02/02/2020 11:55 AM   MICROALBUR 10 05/26/2020 02:40 PM   MICROALBUR 10 05/27/2019 11:30 AM    Last diabetic Eye exam:  Lab Results  Component Value Date/Time   HMDIABEYEEXA No Retinopathy 02/27/2019 12:00 AM    Last diabetic Foot exam: No results found for: HMDIABFOOTEX   Lab Results  Component Value Date   CHOL 141 02/02/2020   HDL 67 02/02/2020   LDLCALC 62 02/02/2020   TRIG 54 02/02/2020   CHOLHDL 2.1 02/02/2020    Hepatic Function  Latest Ref Rng & Units 05/26/2020 09/30/2019 07/13/2019  Total Protein 6.0 - 8.5 g/dL 6.4 6.4 6.3(L)  Albumin 3.7 - 4.7 g/dL 4.3 4.6 4.1  AST 0 - 40 IU/L 45(H) 37 43(H)  ALT 0 - 32 IU/L 36(H) 14 22  Alk Phosphatase 44 - 121 IU/L 54 48 45  Total Bilirubin 0.0 - 1.2 mg/dL 0.4 0.4 0.5    Lab Results  Component Value Date/Time   TSH 0.402 (L) 05/26/2020 11:51 AM   TSH 0.205 (L) 04/04/2020 10:41 AM   FREET4 2.13 (H) 04/04/2020 10:41 AM   FREET4 2.01 (H) 02/02/2020 11:55 AM    CBC Latest Ref Rng & Units 01/31/2020 07/14/2019 07/13/2019  WBC 4.0 - 10.5 K/uL 3.2(L) 4.3 3.9(L)  Hemoglobin 12.0 - 15.0 g/dL 12.1 11.4(L) 12.3  Hematocrit 36.0 - 46.0 % 36.3 34.9(L) 37.8  Platelets 150 - 400 K/uL 242 226 273    Lab Results  Component Value Date/Time   VD25OH 44.9 06/02/2019 09:18 AM    Clinical ASCVD: Yes  The 10-year ASCVD risk score Mikey Bussing DC Jr., et al., 2013) is: 24.5%   Values used to calculate the score:     Age: 89 years     Sex: Female     Is Non-Hispanic African American: Yes     Diabetic: Yes     Tobacco smoker: No     Systolic Blood Pressure: 045 mmHg     Is BP treated: Yes  HDL Cholesterol: 67 mg/dL     Total Cholesterol: 141 mg/dL    Depression screen [facility] 2/9 05/26/2020 09/30/2019 05/27/2019  Decreased Interest 0 0 0  Down, Depressed, Hopeless 0 0 0  PHQ - 2 Score 0 0 0  Altered sleeping - 0 1  Tired, decreased energy - 0 0  Change in appetite - 0 0  Feeling bad or failure about yourself  - 0 0  Trouble concentrating - 0 0  Moving slowly or fidgety/restless - 0 0  Suicidal thoughts - 0 0  PHQ-9 Score - 0 1  Difficult doing work/chores - - Not difficult at all     Social History   Tobacco Use  Smoking Status Never Smoker  Smokeless Tobacco Never Used   BP Readings from Last 3 Encounters:  05/26/20 120/60  05/26/20 120/60  02/02/20 112/70   Pulse Readings from Last 3 Encounters:  05/26/20 (!) 53  05/26/20 (!) 53  02/02/20 80   Wt Readings from Last 3  Encounters:  05/26/20 137 lb 6.4 oz (62.3 kg)  05/26/20 137 lb 6.4 oz (62.3 kg)  02/02/20 140 lb 6.4 oz (63.7 kg)   BMI Readings from Last 3 Encounters:  05/26/20 25.29 kg/m  05/26/20 25.29 kg/m  02/02/20 25.68 kg/m    Assessment/Interventions: Review of patient past medical history, allergies, medications, health status, including review of consultants reports, laboratory and other test data, was performed as part of comprehensive evaluation and provision of chronic care management services.   SDOH:  (Social Determinants of Health) assessments and interventions performed: Yes  SDOH Screenings   Alcohol Screen: Not on file  Depression (PHQ2-9): Low Risk   . PHQ-2 Score: 0  Financial Resource Strain: Low Risk   . Difficulty of Paying Living Expenses: Not hard at all  Food Insecurity: No Food Insecurity  . Worried About Charity fundraiser in the Last Year: Never true  . Ran Out of Food in the Last Year: Never true  Housing: Not on file  Physical Activity: Sufficiently Active  . Days of Exercise per Week: 7 days  . Minutes of Exercise per Session: 30 min  Social Connections: Not on file  Stress: No Stress Concern Present  . Feeling of Stress : Not at all  Tobacco Use: Low Risk   . Smoking Tobacco Use: Never Smoker  . Smokeless Tobacco Use: Never Used  Transportation Needs: No Transportation Needs  . Lack of Transportation (Medical): No  . Lack of Transportation (Non-Medical): No    CCM Care Plan  Allergies  Allergen Reactions  . Atorvastatin Swelling and Other (See Comments)    Limbs swell  . Clopidogrel Other (See Comments)    Caused bruising  . Metoprolol Other (See Comments)    Slows heart rate too low  . Metoprolol Tartrate Swelling and Other (See Comments)    Site of swelling not noted  . Pregabalin Swelling and Other (See Comments)    Site of swelling not noted  . Rosuvastatin Calcium Other (See Comments)    Unknown reaction    Medications Reviewed  Today    Reviewed by Mayford Knife, RPH (Pharmacist) on 08/09/20 at 1216  Med List Status: <None>  Medication Order Taking? Sig Documenting Provider Last Dose Status Informant  albuterol (PROVENTIL HFA;VENTOLIN HFA) 108 (90 Base) MCG/ACT inhaler [id number] Yes Inhale 2 puffs into the lungs every 6 (six) hours as needed for wheezing or shortness of breath. Minette Brine, FNP Taking Active  Med Note Michelle Nasuti   Tue Feb 02, 2020 10:52 AM)    amLODipine (NORVASC) 5 MG tablet [id number] Yes Take 5 mg by mouth at bedtime. 1/2 tab (Dr. Doylene Canard) [provider] Taking Active Self  aspirin EC 81 MG tablet [id number] Yes Take 81 mg by mouth daily. [provider] Taking Active            Med Note Duffy Bruce, Sherrie Mustache Jan 31, 2020 12:41 PM) Patient would not disclose  b complex vitamins tablet [id number] Yes Take 1 tablet by mouth daily with lunch.  [provider] Taking Active            Med Note Duffy Bruce, Sherrie Mustache Jan 31, 2020 12:42 PM) Patient would not disclose  bisacodyl (DULCOLAX) 5 MG EC tablet [id number] No Take 5 mg by mouth daily as needed for moderate constipation.   Patient not taking: Reported on 08/09/2020   [provider] Not Taking Active            Med Note Duffy Bruce, Sherrie Mustache Jan 31, 2020 12:43 PM) Patient would not disclose  Calcium Carb-Cholecalciferol (CALCIUM + D3 PO) [id number] Yes Take 1 tablet by mouth at bedtime. [provider] Taking Active            Med Note Duffy Bruce, Sherrie Mustache Jan 31, 2020 12:42 PM) Patient would not disclose  cholecalciferol (VITAMIN D) 25 MCG (1000 UNIT) tablet [id number] Yes Take 1 tablet by mouth daily.  [provider] Taking Active            Med Note Duffy Bruce, Sherrie Mustache Jan 31, 2020 12:42 PM) Patient would not disclose  cilostazol (PLETAL) 100 MG tablet [id number] Yes TAKE 1 TABLET EVERY DAY O'Neal, Cassie Freer, MD Taking Active   Evolocumab [facility] SURECLICK) 102 MG/ML Darden Palmer  [id number] Yes Inject 140 mg into the skin every 14 (fourteen) days. Geralynn Rile, MD Taking Active   furosemide (LASIX) 20 MG tablet [id number] Yes TAKE 1 TABLET (20 MG TOTAL) BY MOUTH DAILY. Glendale Chard, MD Taking Active   hydrALAZINE (APRESOLINE) 10 MG tablet [id number] Yes Take 1 tablet (10 mg total) by mouth every 8 (eight) hours.  Patient taking differently: Take 10 mg by mouth 3 (three) times daily with meals.   Dixie Dials, MD Taking Active            Med Note Simona Huh Aug 09, 2020 11:58 AM) Per last visit with Dr. Doylene Canard take two tablets twice per day.   isosorbide mononitrate (IMDUR) 60 MG 24 hr tablet [id number] Yes TAKE 1 TABLET EVERY DAY Glendale Chard, MD Taking Active   levothyroxine (SYNTHROID) 88 MCG tablet [id number] Yes Take 1 tablet (88 mcg total) by mouth daily before breakfast. 1 tab daily Glendale Chard, MD Taking Active   Magnesium 250 MG TABS [id number] Yes Take 1 tablet (250 mg total) by mouth at bedtime. Glendale Chard, MD Taking Active Self  Multiple Vitamins-Minerals (EMERGEN-C IMMUNE PO) [id number] Yes Take 1 packet by mouth daily as needed (TO BOOST THE IMMUNE SYSTEM). [provider] Taking Active Self  Multiple Vitamins-Minerals (MULTIVITAMIN WITH MINERALS) tablet [id number] Yes Take 1 tablet by mouth daily with lunch.  [provider] Taking Active Self  nitroGLYCERIN (NITROSTAT) 0.4 MG SL tablet [id number] Yes Place 1 tablet (0.4 mg total) under the tongue every 5 (five) minutes x 3 doses as needed for  chest pain. Glendale Chard, MD Taking Active   olmesartan [facility]) 40 MG tablet [id number] Yes TAKE 1 TABLET EVERY DAY Glendale Chard, MD Taking Active            Med Note Simona Huh Aug 09, 2020 12:15 PM) Patient reports taking Olmesartan 20 mg tablet daily per Dr. Doylene Canard.   potassium chloride (K-DUR) 10 MEQ tablet [id number] Yes Take 10 mEq by mouth 2 (two) times daily with breakfast and lunch.  [provider] Taking Active Self  Semaglutide (OZEMPIC, 0.25 OR 0.5 MG/DOSE, [place]) [id number] Yes Inject 0.25 mg into the skin every Thursday. Every other week (Dr. Doylene Canard) [provider] Taking Active Self          Patient Active Problem List   Diagnosis Date Noted  . Hyperlipidemia 10/30/2019  . Statin myopathy 10/30/2019  . Acute bronchitis 06/06/2018  . Type 2 diabetes mellitus with stage 2 chronic kidney disease, without long-term current use of insulin ([place]) 02/17/2018  . Chronic renal disease, stage II 02/17/2018  . Benign hypertensive heart and renal disease 02/17/2018  . Primary hypothyroidism 02/17/2018  . Localized osteoarthritis of right knee 02/17/2018  . Essential hypertension 06/12/2017  . Acute coronary syndrome ([place]) 12/26/2016  . Hypertensive heart disease without heart failure 10/06/2015  . Insomnia with sleep apnea 10/06/2015  . OSA on CPAP 10/06/2015  . 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 CAD in native artery 10/06/2015  . Chest pain at rest 07/18/2015    Class: Acute  . Weakness 07/18/2015    Class: Acute  . Insomnia 05/05/2015  . Primary snoring 05/05/2015    Immunization History  Administered Date(s) Administered  . Influenza, High Dose Seasonal PF 03/30/2014, 01/31/2015, 04/04/2016, 12/18/2016, 11/13/2018, 01/04/2020  . Influenza-Unspecified 01/24/2013, 12/21/2017  . Moderna SARS-COV2 Booster Vaccination 02/05/2020  . Moderna Sars-Covid-2 Vaccination 05/13/2019, 06/10/2019  . Pneumococcal Polysaccharide-23 12/27/2016  . Zoster, Live 02/03/2013    Conditions to be addressed/monitored:  Hypertension and Hyperlipidemia  Care Plan : [place]  Updates made by Mayford Knife, RPH since 08/18/2020 12:00 AM    Problem: HTN, Angina, HLD, Diabetes   Priority: High  Onset Date: 05/12/2020    Long-Range Goal: Disease Management   Start Date: 05/12/2020  Recent Progress: On track  Priority: High  Note:   Current  Barriers:  . Unable to maintain control of Hypertension  Pharmacist Clinical Goal(s):  [name] Over the next 90 days, patient will maintain control of hypertension as evidenced by BP readings  through collaboration with PharmD and provider.    Interventions: . 1:1 collaboration with Glendale Chard, MD regarding development and update of comprehensive plan of care as evidenced by provider attestation and co-signature . Inter-disciplinary care team collaboration (see longitudinal plan of care) . Comprehensive medication review performed; medication list updated in electronic medical record  Hypertension (BP goal <130/80) -Controlled -Current treatment: . Isosorbide Mononitrate 60 mg tablet once per day . Olmesartan 40 mg tablet taking 1 tablet every day o Changed by Dr. Doylene Canard- reduced to taking 20 mg tablet daily  - Last filled 06/18/2020- 90 day supply, and extra medication left over . Hydralazine 10 mg tablet -  o Take 2 tablets twice per day (Dr. Doylene Canard)  - Patient will need a new prescription for this medication  - Scheduled to go back this Thursday  -Current home readings: 115/60 hr: 43, 91/53 hr:51, 123/62 hr: 53, 110/59 hr:46, 107/47 hr: 45, 113/48 hr: 58, 118/58  hr:52, 104/48 hr:48, 115/66 hr: 45, 108/612 hr: 47, 111/50 hr:45, 104/62 hr: 62, 119/57 hr:47  -Current dietary habits: patient has cut back on the salt.  -Current exercise habits: patient is walking at least 5000 steps per day or more. Sometimes she is unable to spend time exercising but she still commits to making time.  -Patient reports that she was seen by Dr. Doylene Canard and it was 106/58 with a heart rate of 40  -Denies hypotensive/hypertensive symptoms -Educated on Daily salt intake goal < 2300 mg; Exercise goal of 150 minutes per week; Symptoms of hypotension and importance of maintaining adequate hydration; -Counseled to monitor BP at home once a day in the morning, document, and provide log at future appointments -Per  patient Dr. Doylene Canard advised her to keep her cane with her in case she feels she will lose her balance.  -Recommended to continue current medication Recommended that patient follow up with PCP team here about her concerns with her blood pressure.     Hyperlipidemia: (LDL goal < 70) -Controlled -Current treatment: . Repatha 140 mg/ml - inject every 14 days o Patient reports having issues with leg cramps and foot cramps.  -Current dietary patterns: Patient reports drinking at least a gallon of water per day.  -Current exercise habits: Walking at least 5000 steps per day.  -Educated on Cholesterol goals;  Importance of limiting foods high in cholesterol; Exercise goal of 150 minutes per week; -Counseled on diet and exercise extensively Recommended to continue current medication   Angina  (Goal: Reduce chest pain ) -Uncontrolled -Current treatment  . Nitroglycerin 0.4 mg SL tablet - Place 1 tablet under the tongue every 5 minutes X 3 doses as needed for chest pain.  o Last filled 07/11/2020, EXP: 5/24  o Patient reports that it is sent to her every so often   -Counseled on the importance of keeping contact with PCP team and her cardiologist. Recommended patient be seen by the PCP team, patient reports she is okay with me discussing with the PCP team., or be seen by Dr. Doylene Canard prior to this Thursday  08/11/20 if possible.  * once in the past month, and Dr. Doylene Canard was made aware.  Collaborated with PCP team to schedule patient for an appointment with Dr. Baird Cancer due to her frequent use, two times in the past month, of nitroglycerin.  -Patient reports using Nitroglycerin twice in past month. She had one the first week in May.  Patient reports that she has had to take three pills twice in the past month to ease the chest pain.  -Contacted Dr. Merrilee Jansky office and spoke with Wendi Snipes who reported they tried to call her and she did not answer, I called Ms. Montijo and she said she ran an errand and  left her phone at home by accident she is going to call them back.  -Patient reports that she saw Dr. Doylene Canard on 08/11/2020 and she was instructed to continue taking the Nitroglycerin as needed and if it stopped the chest pain she would need to call 911.   Diabetes (A1c goal <7%) -controlled -Current medications: . Ozempic 0.25 mg every other week   -Current home glucose readings . fasting glucose: 98, 87,116,111 99,87,103,101,105,96,94,97, 100,103, 112, 89,87  -Denies hypoglycemic/hyperglycemic symptoms -Current meal patterns:  . breakfast: cup of coffee, or a small container of yogurt, scrambled egg, 1/2 cup of grits with margarine, 2 slices of bacon  . lunch: nothing  . dinner: saute spinach, cabbage or broccoli - patient  reports that she does not eat them often, she has tri -Educated on A1c and blood sugar goals; Exercise goal of 150 minutes per week; -Counseled to check feet daily and get yearly eye exams -Recommended that patient be onboarded to the pharmacy.   Hypothyroidism  -controlled -Current treatment  . Levothyroxine 100 mcg taking 1 tablet by Monday through Saturday  -Recommended to continue current medication  Health Maintenance -Vaccine gaps:  [name] COVID -19 Booster Vaccine: Patient is scheduled to receive the 2nd booster shot on 08/12/2020 @ 9:40 AM at [facility], [place] in [place] -Confirmation number:  [facility] with patient to identify date and time for COVID-19 booster and scheduled patient.   Patient Goals/Self-Care Activities . Patient will:  - take medications as prescribed  Follow Up Plan: Telephone follow up appointment with care management team member scheduled for:  09/20/2020 The patient has been provided with contact information for the care management team and has been advised to call with any health related questions or concerns.       Medication Assistance: Ozempic obtained through [employer] medication assistance program.   Enrollment ends 02/2021  Patient's preferred pharmacy is:  [facility], [place] [address] Phone: [phone] Fax: [place], [address] [place] [address] Phone: [phone] Fax: [phone]  Uses pill box? Yes Pt endorses 90% compliance  We discussed: Benefits of medication synchronization, packaging and delivery as well as enhanced pharmacist oversight with [employer]. Patient decided to: Continue current medication management strategy  Care Plan and Follow Up Patient Decision:  Patient agrees to Care Plan and Follow-up.  Plan: Telephone follow up appointment with care management team member scheduled for:  09/21/2020 and The patient has been provided with contact information for the care management team and has been advised to call with any health related questions or concerns.   Orlando Penner, PharmD Clinical Pharmacist [facility] [phone]

## 2020-08-11 DIAGNOSIS — R072 Precordial pain: Secondary | ICD-10-CM | POA: Diagnosis not present

## 2020-08-11 DIAGNOSIS — E119 Type 2 diabetes mellitus without complications: Secondary | ICD-10-CM | POA: Diagnosis not present

## 2020-08-11 DIAGNOSIS — R634 Abnormal weight loss: Secondary | ICD-10-CM | POA: Diagnosis not present

## 2020-08-11 DIAGNOSIS — E7849 Other hyperlipidemia: Secondary | ICD-10-CM | POA: Diagnosis not present

## 2020-08-11 DIAGNOSIS — E034 Atrophy of thyroid (acquired): Secondary | ICD-10-CM | POA: Diagnosis not present

## 2020-08-11 DIAGNOSIS — R42 Dizziness and giddiness: Secondary | ICD-10-CM | POA: Diagnosis not present

## 2020-08-11 DIAGNOSIS — M25561 Pain in right knee: Secondary | ICD-10-CM | POA: Diagnosis not present

## 2020-08-11 DIAGNOSIS — I2510 Atherosclerotic heart disease of native coronary artery without angina pectoris: Secondary | ICD-10-CM | POA: Diagnosis not present

## 2020-08-18 NOTE — Patient Instructions (Addendum)
Visit Information It was great speaking with you today!  Please let me know if you have any questions about our visit.  Goals Addressed   None     Patient Care Plan: Thyroid Disorder    Problem Identified: Thyroid Disorder   Priority: High    Long-Range Goal: Manage thyroid dysfuction   Start Date: 04/07/2020  Expected End Date: 07/06/2020  This Visit's Progress: On track  Priority: High  Note:   Current Barriers:   Ineffective Self Health Maintenance  Currently UNABLE TO independently self manage needs related to chronic health conditions.   Knowledge Deficits related to short term plan for care coordination needs and long term plans for chronic disease management needs Case Manager Clinical Goal(s):  [name] Collaboration with [name], MD regarding development and update of comprehensive plan of care as evidenced by provider attestation and co-signature . Inter-disciplinary care team collaboration (see longitudinal plan of care)  Over the next 90 days, patient will work with care management team to address care coordination and chronic disease management needs related to Disease Management  Educational Needs  Care Coordination  Medication Management and Education  Psychosocial Support   Interventions:   Reviewed and discussed current lab results for thyroid function   Determined PCP recommended a Synthroid dosage change/frequency for treatment of abnormal thyroid levels  Reviewed and discussed the following medication dosage change per PCP;  Levothyroxine (Synthroid) take 100 mcg daily before breakfast, Monday-Saturday  Determined patient verbalizes understanding of the new dosage/frequency, confirmed she picked up the new Rx from the pharmacy as directed   Re-educated on how to take Synthroid first thing in the morning 30 minutes before eating/drinking or taking other medications, instructed to wait at least 4 hours prior to taking antiacids or vitamins    Reviewed and discussed next PCP follow up appointment scheduled for 05/26/20 @10 :30 am   Discussed plans with patient for ongoing care management follow up and provided patient with direct contact information for care management team Patient Goals/Self-Care Activities:  Over the next 90 days, patient will: -take Synthroid exactly as prescribed w/o missed doses -Call PCP and or CCM team for questions or concerns -Keep all scheduled MD follow up appointments as recommended  Follow Up Plan: Telephone follow up appointment with care management team member scheduled for: 05/30/20   Patient Care Plan: Social Work [facility] Care Plan    Problem Identified: Barriers to Treatment     Long-Range Goal: Barriers to Treatment Identified and Managed   Start Date: 05/03/2020  Expected End Date: 08/31/2020  This Visit's Progress: On track  Priority: Low  Note:   Current Barriers:  . Chronic disease management support and education needs related to HTN, DM, and CKD Stage II    Social Worker Clinical Goal(s):  10/31/2020 Over the next 120 days, patient will work with SW to identify and address any acute and/or chronic care coordination needs related to the self health management of HTN, DM, and CKD Stage II    CCM SW Interventions:  . Inter-disciplinary care team collaboration (see longitudinal plan of care) . Collaboration with [name], MD regarding development and update of comprehensive plan of care as evidenced by provider attestation and co-signature . Successful outbound call placed to the patient to assess for care coordination needs . Discussed the patient did obtain her Repatha from [name] . Patient plans to continue using [facility] for this medication . Assessed for SW resources needs - none identified at this time . Scheduled follow  up call over the 90 days  Patient Goals/Self-Care Activities . Over the next 90 days, patient will:   - Patient will self administer medications as  prescribed Patient will attend all scheduled provider appointments Patient will call pharmacy for medication refills Patient will call provider office for new concerns or questions Contact SW as needed prior to next scheduled call Follow Up Plan:  SW will follow up with the patient over the next 90 days    Patient Care Plan: CCM Pharmacy Care Plan    Problem Identified: HTN, Angina, HLD, Diabetes   Priority: High  Onset Date: 05/12/2020    Long-Range Goal: Disease Management   Start Date: 05/12/2020  Recent Progress: On track  Priority: High  Note:   Current Barriers:  . Unable to maintain control of Hypertension  Pharmacist Clinical Goal(s):  [name] Over the next 90 days, patient will maintain control of hypertension as evidenced by BP readings  through collaboration with PharmD and provider.    Interventions: . 1:1 collaboration with [name], MD regarding development and update of comprehensive plan of care as evidenced by provider attestation and co-signature . Inter-disciplinary care team collaboration (see longitudinal plan of care) . Comprehensive medication review performed; medication list updated in electronic medical record  Hypertension (BP goal <130/80) -Controlled -Current treatment: . Isosorbide Mononitrate 60 mg tablet once per day . Olmesartan 40 mg tablet taking 1 tablet every day o Changed by Dr. Algie Coffer- reduced to taking 20 mg tablet daily  - Last filled 06/18/2020- 90 day supply, and extra medication left over . Hydralazine 10 mg tablet -  o Take 2 tablets twice per day (Dr. Algie Coffer)  - Patient will need a new prescription for this medication  - Scheduled to go back this Thursday  -Current home readings: 115/60 hr: 43, 91/53 hr:51, 123/62 hr: 53, 110/59 hr:46, 107/47 hr: 45, 113/48 hr: 58, 118/58 hr:52, 104/48 hr:48, 115/66 hr: 45, 108/612 hr: 47, 111/50 hr:45, 104/62 hr: 62, 119/57 hr:47  -Current dietary habits: patient has cut back on the salt.   -Current exercise habits: patient is walking at least 5000 steps per day or more. Sometimes she is unable to spend time exercising but she still commits to making time.  -Patient reports that she was seen by Dr. Algie Coffer and it was 106/58 with a heart rate of 40  -Denies hypotensive/hypertensive symptoms -Educated on Daily salt intake goal < 2300 mg; Exercise goal of 150 minutes per week; Symptoms of hypotension and importance of maintaining adequate hydration; -Counseled to monitor BP at home once a day in the morning, document, and provide log at future appointments -Per patient Dr. Algie Coffer advised her to keep her cane with her in case she feels she will lose her balance.  -Recommended to continue current medication Recommended that patient follow up with PCP team here about her concerns with her blood pressure.     Hyperlipidemia: (LDL goal < 70) -Controlled -Current treatment: . Repatha 140 mg/ml - inject every 14 days o Patient reports having issues with leg cramps and foot cramps.  -Current dietary patterns: Patient reports drinking at least a gallon of water per day.  -Current exercise habits: Walking at least 5000 steps per day.  -Educated on Cholesterol goals;  Importance of limiting foods high in cholesterol; Exercise goal of 150 minutes per week; -Counseled on diet and exercise extensively Recommended to continue current medication   Angina  (Goal: Reduce chest pain ) -Uncontrolled -Current treatment  . Nitroglycerin 0.4 mg  SL tablet - Place 1 tablet under the tongue every 5 minutes X 3 doses as needed for chest pain.  o Last filled 07/11/2020, EXP: 5/24  o Patient reports that it is sent to her every so often   -Counseled on the importance of keeping contact with PCP team and her cardiologist. Recommended patient be seen by the PCP team, patient reports she is okay with me discussing with the PCP team., or be seen by Dr. Algie Coffer prior to this Thursday  08/11/20 if  possible.  * once in the past month, and Dr. Algie Coffer was made aware.  Collaborated with PCP team to schedule patient for an appointment with Dr. Allyne Gee due to her frequent use, two times in the past month, of nitroglycerin.  -Patient reports using Nitroglycerin twice in past month. She had one the first week in May.  Patient reports that she has had to take three pills twice in the past month to ease the chest pain.  -Contacted Dr. Roseanne Kaufman office and spoke with Sharia Reeve who reported they tried to call her and she did not answer, I called Ms. Nielsen and she said she ran an errand and left her phone at home by accident she is going to call them back.  -Patient reports that she saw Dr. Algie Coffer on 08/11/2020 and she was instructed to continue taking the Nitroglycerin as needed and if it stopped the chest pain she would need to call 911.   Diabetes (A1c goal <7%) -controlled -Current medications: . Ozempic 0.25 mg every other week   -Current home glucose readings . fasting glucose: 98, 87,116,111 99,87,103,101,105,96,94,97, 100,103, 112, 89,87  -Denies hypoglycemic/hyperglycemic symptoms -Current meal patterns:  . breakfast: cup of coffee, or a small container of yogurt, scrambled egg, 1/2 cup of grits with margarine, 2 slices of bacon  . lunch: nothing  . dinner: saute spinach, cabbage or broccoli - patient reports that she does not eat them often, she has tri -Educated on A1c and blood sugar goals; Exercise goal of 150 minutes per week; -Counseled to check feet daily and get yearly eye exams -Recommended that patient be onboarded to the pharmacy.   Hypothyroidism  -controlled -Current treatment  . Levothyroxine 100 mcg taking 1 tablet by Monday through Saturday  -Recommended to continue current medication  Health Maintenance -Vaccine gaps:  [name] COVID -19 Booster Vaccine: Patient is scheduled to receive the 2nd booster shot on 08/12/2020 @ 9:40 AM at [facility] [place] in [place]  -Confirmation number:  [id number] -Collaborated with patient to identify date and time for COVID-19 booster and scheduled patient.   Patient Goals/Self-Care Activities . Patient will:  - take medications as prescribed  Follow Up Plan: Telephone follow up appointment with care management team member scheduled for:  09/20/2020 The patient has been provided with contact information for the care management team and has been advised to call with any health related questions or concerns.       Patient agreed to services and verbal consent obtained.   The patient verbalized understanding of instructions, educational materials, and care plan provided today and agreed to receive a mailed copy of patient instructions, educational materials, and care plan.   [name], PharmD Clinical Pharmacist [facility] [phone]

## 2020-09-19 ENCOUNTER — Telehealth: Payer: Self-pay

## 2020-09-19 NOTE — Chronic Care Management (AMB) (Signed)
Chronic Care Management Pharmacy Assistant   Name: Claude Swendsen  MRN: [id number] DOB: 11-02-1945   Reason for Encounter: Disease State/ Hypertension  Recent office visits:  None  Recent consult visits:  None  Hospital visits:  None in previous 6 months  Medications: Outpatient Encounter Medications as of 09/19/2020  Medication Sig Note   albuterol (PROVENTIL HFA;VENTOLIN HFA) 108 (90 Base) MCG/ACT inhaler Inhale 2 puffs into the lungs every 6 (six) hours as needed for wheezing or shortness of breath.    amLODipine (NORVASC) 5 MG tablet Take 5 mg by mouth at bedtime. 1/2 tab (Dr. Algie Coffer)    aspirin EC 81 MG tablet Take 81 mg by mouth daily. 01/31/2020: Patient would not disclose   b complex vitamins tablet Take 1 tablet by mouth daily with lunch.  01/31/2020: Patient would not disclose   bisacodyl (DULCOLAX) 5 MG EC tablet Take 5 mg by mouth daily as needed for moderate constipation.  (Patient not taking: Reported on 08/09/2020) 01/31/2020: Patient would not disclose   Calcium Carb-Cholecalciferol (CALCIUM + D3 PO) Take 1 tablet by mouth at bedtime. 01/31/2020: Patient would not disclose   cholecalciferol (VITAMIN D) 25 MCG (1000 UNIT) tablet Take 1 tablet by mouth daily.  01/31/2020: Patient would not disclose   cilostazol (PLETAL) 100 MG tablet TAKE 1 TABLET EVERY DAY    Evolocumab (REPATHA SURECLICK) 140 MG/ML SOAJ Inject 140 mg into the skin every 14 (fourteen) days.    furosemide (LASIX) 20 MG tablet TAKE 1 TABLET (20 MG TOTAL) BY MOUTH DAILY.    hydrALAZINE (APRESOLINE) 10 MG tablet Take 1 tablet (10 mg total) by mouth every 8 (eight) hours. (Patient taking differently: Take 10 mg by mouth 3 (three) times daily with meals.) 08/09/2020: Per last visit with Dr. Algie Coffer take two tablets twice per day.    isosorbide mononitrate (IMDUR) 60 MG 24 hr tablet TAKE 1 TABLET EVERY DAY    levothyroxine (SYNTHROID) 88 MCG tablet Take 1 tablet (88 mcg total) by mouth daily before  breakfast. 1 tab daily    Magnesium 250 MG TABS Take 1 tablet (250 mg total) by mouth at bedtime.    Multiple Vitamins-Minerals (EMERGEN-C IMMUNE PO) Take 1 packet by mouth daily as needed (TO BOOST THE IMMUNE SYSTEM).    Multiple Vitamins-Minerals (MULTIVITAMIN WITH MINERALS) tablet Take 1 tablet by mouth daily with lunch.     nitroGLYCERIN (NITROSTAT) 0.4 MG SL tablet Place 1 tablet (0.4 mg total) under the tongue every 5 (five) minutes x 3 doses as needed for chest pain.    olmesartan (BENICAR) 40 MG tablet TAKE 1 TABLET EVERY DAY 08/09/2020: Patient reports taking Olmesartan 20 mg tablet daily per Dr. Algie Coffer.    potassium chloride (K-DUR) 10 MEQ tablet Take 10 mEq by mouth 2 (two) times daily with breakfast and lunch.     Semaglutide (OZEMPIC, 0.25 OR 0.5 MG/DOSE, [place]) Inject 0.25 mg into the skin every Thursday. Every other week (Dr. Algie Coffer) 08/09/2020: Taking once a week on Friday.    No facility-administered encounter medications on file as of 09/19/2020.    Reviewed chart prior to disease state call. Spoke with patient regarding BP  Recent Office Vitals: BP Readings from Last 3 Encounters:  05/26/20 120/60  05/26/20 120/60  02/02/20 112/70   Pulse Readings from Last 3 Encounters:  05/26/20 (!) 53  05/26/20 (!) 53  02/02/20 80    Wt Readings from Last 3 Encounters:  05/26/20 137 lb 6.4 oz (62.3 kg)  05/26/20 137 lb 6.4 oz (62.3 kg)  02/02/20 140 lb 6.4 oz (63.7 kg)     Kidney Function Lab Results  Component Value Date/Time   CREATININE 1.06 (H) 05/26/2020 11:51 AM   CREATININE 1.11 (H) 01/31/2020 10:44 AM   GFRNONAA 52 (L) 01/31/2020 10:44 AM   GFRAA 55 (L) 09/30/2019 10:48 AM    BMP Latest Ref Rng & Units 05/26/2020 01/31/2020 09/30/2019  Glucose 65 - 99 mg/dL 354(S) 568(L) 98  BUN 8 - 27 mg/dL 10 10 13   Creatinine 0.57 - 1.00 mg/dL ) 2.75(T) 7.00(F)  BUN/Creat Ratio 12 - 28 9(L) - 11(L)  Sodium 134 - 144 mmol/L 140 139 139  Potassium 3.5 - 5.2 mmol/L 4.4 4.4 4.4   Chloride 96 - 106 mmol/L 104 105 103  CO2 20 - 29 mmol/L 22 24 22   Calcium 8.7 - 10.3 mg/dL 7.49(S 9.9    Current antihypertensive regimen:  Isosorbide Mononitrate 60 mg daily Olmesartan 20 mg daily Hydralazine 10 mg 2 tablets twice daily  How often are you checking your Blood Pressure? daily  Current home BP readings: 127/77, 124/58, 138/71.  What recent interventions/DTPs have been made by any provider to improve Blood Pressure control since last CPP Visit:  Educated on Daily salt intake goal < 2300 mg Exercise goal of 150 minutes per week  Any recent hospitalizations or ED visits since last visit with CPP? No  What diet changes have been made to improve Blood Pressure Control?  Patient states she has decreased her salt intake  What exercise is being done to improve your Blood Pressure Control?  Patient states she walks at least 5000 steps per day or more.  Adherence Review: Is the patient currently on ACE/ARB medication? Yes Does the patient have >5 day gap between last estimated fill dates? No  NOTES: Patient states her blood pressure is being managed. Sent scheduling a message to reschedule patient's appointment with 49.6 CPP on 09-21-2020 to 11-03-2020.  Star Rating Drugs: Ozempic 0.25 mg- Patient assistance Olmesartan 40 mg- Last filled 01-22-2020 90 DS [employer] (Patient is taking 20 mg and is up to date with [employer])  01-03-2021 [facility] Clinical Pharmacist Assistant [phone]

## 2020-09-21 ENCOUNTER — Telehealth: Payer: Self-pay

## 2020-09-30 DIAGNOSIS — E1165 Type 2 diabetes mellitus with hyperglycemia: Secondary | ICD-10-CM | POA: Diagnosis not present

## 2020-09-30 DIAGNOSIS — Z794 Long term (current) use of insulin: Secondary | ICD-10-CM | POA: Diagnosis not present

## 2020-09-30 DIAGNOSIS — E119 Type 2 diabetes mellitus without complications: Secondary | ICD-10-CM | POA: Diagnosis not present

## 2020-10-03 ENCOUNTER — Ambulatory Visit (INDEPENDENT_AMBULATORY_CARE_PROVIDER_SITE_OTHER): Payer: Medicare PPO | Admitting: Internal Medicine

## 2020-10-03 ENCOUNTER — Other Ambulatory Visit: Payer: Self-pay

## 2020-10-03 ENCOUNTER — Encounter: Payer: Self-pay | Admitting: Internal Medicine

## 2020-10-03 VITALS — BP 120/60 | HR 54 | Temp 98.1°F | Ht 61.8 in | Wt 144.4 lb

## 2020-10-03 DIAGNOSIS — E1122 Type 2 diabetes mellitus with diabetic chronic kidney disease: Secondary | ICD-10-CM

## 2020-10-03 DIAGNOSIS — N182 Chronic kidney disease, stage 2 (mild): Secondary | ICD-10-CM

## 2020-10-03 DIAGNOSIS — E782 Mixed hyperlipidemia: Secondary | ICD-10-CM | POA: Diagnosis not present

## 2020-10-03 DIAGNOSIS — R001 Bradycardia, unspecified: Secondary | ICD-10-CM

## 2020-10-03 DIAGNOSIS — E663 Overweight: Secondary | ICD-10-CM | POA: Diagnosis not present

## 2020-10-03 DIAGNOSIS — E039 Hypothyroidism, unspecified: Secondary | ICD-10-CM | POA: Diagnosis not present

## 2020-10-03 DIAGNOSIS — I119 Hypertensive heart disease without heart failure: Secondary | ICD-10-CM

## 2020-10-03 DIAGNOSIS — Z0000 Encounter for general adult medical examination without abnormal findings: Secondary | ICD-10-CM

## 2020-10-03 NOTE — Progress Notes (Signed)
I,Tianna Badgett,acting as a Education administrator for [name], MD.,have documented all relevant documentation on the behalf of [name], MD,as directed by  [name], MD while in the presence of [name], MD.  This visit occurred during the SARS-CoV-2 public health emergency.  Safety protocols were in place, including screening questions prior to the visit, additional usage of staff PPE, and extensive cleaning of exam room while observing appropriate contact time as indicated for disinfecting solutions.  Subjective:     Patient ID: [name] , female    DOB: April 20, 1945 , 75 y.o.   MRN: [id number]   Chief Complaint  Patient presents with   Annual Exam   Diabetes   Hypertension    HPI  The patient is here today for a physical exam. She reports compliance with meds. She denies headaches, chest pain and palpitations.   Diabetes She presents for her follow-up diabetic visit. She has type 2 diabetes mellitus. Pertinent negatives for hypoglycemia include no headaches. Pertinent negatives for diabetes include no blurred vision. There are no hypoglycemic complications. Diabetic complications include heart disease and nephropathy. Risk factors for coronary artery disease include diabetes mellitus, dyslipidemia, hypertension and post-menopausal. She is following a diabetic diet. She participates in exercise intermittently. An ACE inhibitor/angiotensin II receptor blocker is being taken. Eye exam is current.  Hypertension This is a chronic problem. The current episode started more than 1 year ago. The problem has been gradually improving since onset. The problem is controlled. Pertinent negatives include no blurred vision or headaches. The current treatment provides moderate improvement. 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    Past Medical History:  Diagnosis Date   Benign hypertensive renal disease    CKD stage 2 due to type 2 diabetes mellitus  (HCC)    Hypertension    Hypothyroidism    Obesity    OSA on CPAP    Pure hypercholesterolemia    PVD (peripheral vascular disease) (HCC)    Vitamin D deficiency      Family History  Problem Relation Age of Onset   Alzheimer's disease Mother    Aneurysm Father    Cancer Brother      Current Outpatient Medications:    albuterol (PROVENTIL HFA;VENTOLIN HFA) 108 (90 Base) MCG/ACT inhaler, Inhale 2 puffs into the lungs every 6 (six) hours as needed for wheezing or shortness of breath., Disp: 1 Inhaler, Rfl: 2   amLODipine (NORVASC) 5 MG tablet, Take 5 mg by mouth at bedtime. 1/2 tab (Dr. Doylene Canard), Disp: , Rfl:    aspirin EC 81 MG tablet, Take 81 mg by mouth daily., Disp: , Rfl:    b complex vitamins tablet, Take 1 tablet by mouth daily with lunch. , Disp: , Rfl:    Calcium Carb-Cholecalciferol (CALCIUM + D3 PO), Take 1 tablet by mouth at bedtime., Disp: , Rfl:    cholecalciferol (VITAMIN D) 25 MCG (1000 UNIT) tablet, Take 1 tablet by mouth daily. , Disp: , Rfl:    cilostazol (PLETAL) 100 MG tablet, TAKE 1 TABLET EVERY DAY, Disp: 90 tablet, Rfl: 1   Evolocumab (REPATHA SURECLICK) 017 MG/ML SOAJ, Inject 140 mg into the skin every 14 (fourteen) days., Disp: 6 mL, Rfl: 3   furosemide (LASIX) 20 MG tablet, TAKE 1 TABLET (20 MG TOTAL) BY MOUTH DAILY., Disp: 90 tablet, Rfl: 2   hydrALAZINE (APRESOLINE) 10 MG tablet, Take 1 tablet (10 mg total) by mouth every 8 (eight) hours. (Patient taking differently: Take  10 mg by mouth 3 (three) times daily with meals.), Disp: 90 tablet, Rfl: 3   isosorbide mononitrate (IMDUR) 60 MG 24 hr tablet, TAKE 1 TABLET EVERY DAY, Disp: 90 tablet, Rfl: 2   levothyroxine (SYNTHROID) 88 MCG tablet, Take 1 tablet (88 mcg total) by mouth daily before breakfast. 1 tab daily, Disp: 90 tablet, Rfl: 1   Magnesium 250 MG TABS, Take 1 tablet (250 mg total) by mouth at bedtime., Disp: 90 tablet, Rfl: 2   Multiple Vitamins-Minerals (MULTIVITAMIN WITH MINERALS) tablet, Take 1  tablet by mouth daily with lunch. , Disp: , Rfl:    nitroGLYCERIN (NITROSTAT) 0.4 MG SL tablet, Place 1 tablet (0.4 mg total) under the tongue every 5 (five) minutes x 3 doses as needed for chest pain., Disp: 90 tablet, Rfl: 1   olmesartan (BENICAR) 40 MG tablet, TAKE 1 TABLET EVERY DAY, Disp: 90 tablet, Rfl: 2   potassium chloride (K-DUR) 10 MEQ tablet, Take 10 mEq by mouth 2 (two) times daily with breakfast and lunch. , Disp: , Rfl: 3   Semaglutide (OZEMPIC, 0.25 OR 0.5 MG/DOSE, [place]), Inject 0.25 mg into the skin every Thursday. Every other week (Dr. Doylene Canard), Disp: , Rfl:    Allergies  Allergen Reactions   Atorvastatin Swelling and Other (See Comments)    Limbs swell   Clopidogrel Other (See Comments)    Caused bruising   Metoprolol Other (See Comments)    Slows heart rate too low   Metoprolol Tartrate Swelling and Other (See Comments)    Site of swelling not noted   Pregabalin Swelling and Other (See Comments)    Site of swelling not noted   Rosuvastatin Calcium Other (See Comments)    Unknown reaction      The patient states she uses none for birth control. Last LMP was No LMP recorded. Patient has had a hysterectomy.. Negative for Dysmenorrhea. Negative for: breast discharge, breast lump(s), breast pain and breast self exam. Associated symptoms include abnormal vaginal bleeding. Pertinent negatives include abnormal bleeding (hematology), anxiety, decreased libido, depression, difficulty falling sleep, dyspareunia, history of infertility, nocturia, sexual dysfunction, sleep disturbances, urinary incontinence, urinary urgency, vaginal discharge and vaginal itching. Diet regular.The patient states her exercise level is  intermittent.   . The patient's tobacco use is:  Social History   Tobacco Use  Smoking Status Never  Smokeless Tobacco Never  . She has been exposed to passive smoke. The patient's alcohol use is:  Social History   Substance and Sexual Activity  Alcohol Use No    Alcohol/week: 0.0 standard drinks   Comment: occasional wine    Review of Systems  Constitutional: Negative.   HENT: Negative.    Eyes: Negative.  Negative for blurred vision.  Respiratory: Negative.    Cardiovascular: Negative.   Gastrointestinal: Negative.   Endocrine: Negative.   Genitourinary: Negative.   Musculoskeletal: Negative.   Skin: Negative.   Allergic/Immunologic: Negative.   Neurological: Negative.  Negative for headaches.  Hematological: Negative.   Psychiatric/Behavioral: Negative.      Today's Vitals   10/03/20 1134  BP: 120/60  Pulse: (!) 54  Temp: 98.1 F (36.7 C)  TempSrc: Oral  Weight: 144 lb 6.4 oz (65.5 kg)  Height: 5' 1.8" (1.57 m)   Body mass index is 26.58 kg/m.  Wt Readings from Last 3 Encounters:  10/03/20 144 lb 6.4 oz (65.5 kg)  05/26/20 137 lb 6.4 oz (62.3 kg)  05/26/20 137 lb 6.4 oz (62.3 kg)    Objective:  Physical Exam Vitals and nursing note reviewed.  Constitutional:      Appearance: Normal appearance.  HENT:     Head: Normocephalic and atraumatic.     Right Ear: Tympanic membrane, ear canal and external ear normal.     Left Ear: Tympanic membrane, ear canal and external ear normal.     Nose:     Comments: Masked     Mouth/Throat:     Comments: Masked  Eyes:     Extraocular Movements: Extraocular movements intact.     Conjunctiva/sclera: Conjunctivae normal.     Pupils: Pupils are equal, round, and reactive to light.  Cardiovascular:     Rate and Rhythm: Normal rate and regular rhythm.     Pulses: Normal pulses.          Dorsalis pedis pulses are 2+ on the right side and 2+ on the left side.     Heart sounds: Normal heart sounds.  Pulmonary:     Effort: Pulmonary effort is normal.     Breath sounds: Normal breath sounds.  Abdominal:     General: Abdomen is flat. Bowel sounds are normal.     Palpations: Abdomen is soft.  Genitourinary:    Comments: deferred Musculoskeletal:        General: Normal range of motion.      Cervical back: Normal range of motion and neck supple.  Feet:     Right foot:     Protective Sensation: 5 sites tested.  5 sites sensed.     Skin integrity: Skin integrity normal.     Toenail Condition: Right toenails are normal.     Left foot:     Protective Sensation: 5 sites tested.  5 sites sensed.     Skin integrity: Skin integrity normal.     Toenail Condition: Left toenails are normal.  Skin:    General: Skin is warm and dry.  Neurological:     General: No focal deficit present.     Mental Status: She is alert and oriented to person, place, and time.  Psychiatric:        Mood and Affect: Mood normal.        Behavior: Behavior normal.        Assessment And Plan:     1. Routine general medical examination at health care facility Comments: A full exam was performed. Importance of monthly self breast exams was discussed with the patient. PATIENT IS ADVISED TO GET 30-45 MINUTES REGULAR EXERCISE NO LESS THAN FOUR TO FIVE DAYS PER WEEK - BOTH WEIGHTBEARING EXERCISES AND AEROBIC ARE RECOMMENDED.  PATIENT IS ADVISED TO FOLLOW A HEALTHY DIET WITH AT LEAST SIX FRUITS/VEGGIES PER DAY, DECREASE INTAKE OF RED MEAT, AND TO INCREASE FISH INTAKE TO TWO DAYS PER WEEK.  MEATS/FISH SHOULD NOT BE FRIED, BAKED OR BROILED IS PREFERABLE.  IT IS ALSO IMPORTANT TO CUT BACK ON YOUR SUGAR INTAKE. PLEASE AVOID ANYTHING WITH ADDED SUGAR, CORN SYRUP OR OTHER SWEETENERS. IF YOU MUST USE A SWEETENER, YOU CAN TRY STEVIA. IT IS ALSO IMPORTANT TO AVOID ARTIFICIALLY SWEETENERS AND DIET BEVERAGES. LASTLY, I SUGGEST WEARING SPF 50 SUNSCREEN ON EXPOSED PARTS AND ESPECIALLY WHEN IN THE DIRECT SUNLIGHT FOR AN EXTENDED PERIOD OF TIME.  PLEASE AVOID FAST FOOD RESTAURANTS AND INCREASE YOUR WATER INTAKE.   2. Hypertensive heart disease without heart failure Comments: Chronic, well controlled. She is encouraged to follow low sodium diet. EKG performed, marked SB w/ RBBB. - EKG 12-Lead - CMP14+EGFR  3. Type 2  diabetes  mellitus with stage 2 chronic kidney disease, without long-term current use of insulin (HCC) Comments: Diabetic foot exam was performed.  I DISCUSSED WITH THE PATIENT AT LENGTH REGARDING THE GOALS OF GLYCEMIC CONTROL AND POSSIBLE LONG-TERM COMPLICATIONS.  I  ALSO STRESSED THE IMPORTANCE OF COMPLIANCE WITH HOME GLUCOSE MONITORING, DIETARY RESTRICTIONS INCLUDING AVOIDANCE OF SUGARY DRINKS/PROCESSED FOODS,  ALONG WITH REGULAR EXERCISE.  I  ALSO STRESSED THE IMPORTANCE OF ANNUAL EYE EXAMS, SELF FOOT CARE AND COMPLIANCE WITH OFFICE VISITS.  - Hemoglobin A1c - CMP14+EGFR - Lipid panel - CBC  4. Mixed hyperlipidemia Comments: Chronic ,encouraged to take statin therapy as prescribed and live heart healthy lifestyle.   5. Primary hypothyroidism Comments: I will check a thyroid panel and adjust meds as needed.  - TSH - T4, Free  6. Bradycardia, sinus Comments: HR 39 on EKG. Likely exacerbated by hypothyroidism. I will check TSH today. She does not appear to be symptomatic. Will forward ekg to her cardiologist.   7. Overweight (BMI 25.0-29.9) Her BMI is acceptable for her demographic. She is encouraged to aim for at least 150 minutes of exercise per day.   Patient was given opportunity to ask questions. Patient verbalized understanding of the plan and was able to repeat key elements of the plan. All questions were answered to their satisfaction.   I, [name], MD, have reviewed all documentation for this visit. The documentation on 10/13/20 for the exam, diagnosis, procedures, and orders are all accurate and complete.  THE PATIENT IS ENCOURAGED TO PRACTICE SOCIAL DISTANCING DUE TO THE COVID-19 PANDEMIC.

## 2020-10-03 NOTE — Patient Instructions (Signed)
Health Maintenance, Female Adopting a healthy lifestyle and getting preventive care are important in promoting health and wellness. Ask your health care provider about: The right schedule for you to have regular tests and exams. Things you can do on your own to prevent diseases and keep yourself healthy. What should I know about diet, weight, and exercise? Eat a healthy diet  Eat a diet that includes plenty of vegetables, fruits, low-fat dairy products, and lean protein. Do not eat a lot of foods that are high in solid fats, added sugars, or sodium.  Maintain a healthy weight Body mass index (BMI) is used to identify weight problems. It estimates body fat based on height and weight. Your health care provider can help determineyour BMI and help you achieve or maintain a healthy weight. Get regular exercise Get regular exercise. This is one of the most important things you can do for your health. Most adults should: Exercise for at least 150 minutes each week. The exercise should increase your heart rate and make you sweat (moderate-intensity exercise). Do strengthening exercises at least twice a week. This is in addition to the moderate-intensity exercise. Spend less time sitting. Even light physical activity can be beneficial. Watch cholesterol and blood lipids Have your blood tested for lipids and cholesterol at 76 years of age, then havethis test every 5 years. Have your cholesterol levels checked more often if: Your lipid or cholesterol levels are high. You are older than 76 years of age. You are at high risk for heart disease. What should I know about cancer screening? Depending on your health history and family history, you may need to have cancer screening at various ages. This may include screening for: Breast cancer. Cervical cancer. Colorectal cancer. Skin cancer. Lung cancer. What should I know about heart disease, diabetes, and high blood pressure? Blood pressure and heart  disease High blood pressure causes heart disease and increases the risk of stroke. This is more likely to develop in people who have high blood pressure readings, are of African descent, or are overweight. Have your blood pressure checked: Every 3-5 years if you are 18-39 years of age. Every year if you are 40 years old or older. Diabetes Have regular diabetes screenings. This checks your fasting blood sugar level. Have the screening done: Once every three years after age 40 if you are at a normal weight and have a low risk for diabetes. More often and at a younger age if you are overweight or have a high risk for diabetes. What should I know about preventing infection? Hepatitis B If you have a higher risk for hepatitis B, you should be screened for this virus. Talk with your health care provider to find out if you are at risk forhepatitis B infection. Hepatitis C Testing is recommended for: Everyone born from 1945 through 1965. Anyone with known risk factors for hepatitis C. Sexually transmitted infections (STIs) Get screened for STIs, including gonorrhea and chlamydia, if: You are sexually active and are younger than 76 years of age. You are older than 76 years of age and your health care provider tells you that you are at risk for this type of infection. Your sexual activity has changed since you were last screened, and you are at increased risk for chlamydia or gonorrhea. Ask your health care provider if you are at risk. Ask your health care provider about whether you are at high risk for HIV. Your health care provider may recommend a prescription medicine to help   prevent HIV infection. If you choose to take medicine to prevent HIV, you should first get tested for HIV. You should then be tested every 3 months for as long as you are taking the medicine. Pregnancy If you are about to stop having your period (premenopausal) and you may become pregnant, seek counseling before you get  pregnant. Take 400 to 800 micrograms (mcg) of folic acid every day if you become pregnant. Ask for birth control (contraception) if you want to prevent pregnancy. Osteoporosis and menopause Osteoporosis is a disease in which the bones lose minerals and strength with aging. This can result in bone fractures. If you are 65 years old or older, or if you are at risk for osteoporosis and fractures, ask your health care provider if you should: Be screened for bone loss. Take a calcium or vitamin D supplement to lower your risk of fractures. Be given hormone replacement therapy (HRT) to treat symptoms of menopause. Follow these instructions at home: Lifestyle Do not use any products that contain nicotine or tobacco, such as cigarettes, e-cigarettes, and chewing tobacco. If you need help quitting, ask your health care provider. Do not use street drugs. Do not share needles. Ask your health care provider for help if you need support or information about quitting drugs. Alcohol use Do not drink alcohol if: Your health care provider tells you not to drink. You are pregnant, may be pregnant, or are planning to become pregnant. If you drink alcohol: Limit how much you use to 0-1 drink a day. Limit intake if you are breastfeeding. Be aware of how much alcohol is in your drink. In the [place] one drink equals one 12 oz bottle of beer (355 mL), one 5 oz glass of wine (148 mL), or one 1 oz glass of hard liquor (44 mL). General instructions Schedule regular health, dental, and eye exams. Stay current with your vaccines. Tell your health care provider if: You often feel depressed. You have ever been abused or do not feel safe at home. Summary Adopting a healthy lifestyle and getting preventive care are important in promoting health and wellness. Follow your health care provider's instructions about healthy diet, exercising, and getting tested or screened for diseases. Follow your health care provider's  instructions on monitoring your cholesterol and blood pressure. This information is not intended to replace advice given to you by your health care provider. Make sure you discuss any questions you have with your healthcare provider. Document Revised: 03/05/2018 Document Reviewed: 03/05/2018 Elsevier Patient Education  2022 [employer]  

## 2020-10-04 ENCOUNTER — Telehealth: Payer: Self-pay

## 2020-10-04 ENCOUNTER — Ambulatory Visit: Payer: Self-pay

## 2020-10-04 ENCOUNTER — Telehealth: Payer: Medicare PPO

## 2020-10-04 DIAGNOSIS — E7800 Pure hypercholesterolemia, unspecified: Secondary | ICD-10-CM

## 2020-10-04 DIAGNOSIS — E039 Hypothyroidism, unspecified: Secondary | ICD-10-CM

## 2020-10-04 DIAGNOSIS — I2510 Atherosclerotic heart disease of native coronary artery without angina pectoris: Secondary | ICD-10-CM

## 2020-10-04 DIAGNOSIS — N182 Chronic kidney disease, stage 2 (mild): Secondary | ICD-10-CM

## 2020-10-04 DIAGNOSIS — E1122 Type 2 diabetes mellitus with diabetic chronic kidney disease: Secondary | ICD-10-CM

## 2020-10-04 DIAGNOSIS — I10 Essential (primary) hypertension: Secondary | ICD-10-CM

## 2020-10-04 LAB — HEMOGLOBIN A1C
Est. average glucose Bld gHb Est-mCnc: 131 mg/dL
Hgb A1c MFr Bld: 6.2 % — ABNORMAL HIGH (ref 4.8–5.6)

## 2020-10-04 LAB — CMP14+EGFR
ALT: 19 IU/L (ref 0–32)
AST: 37 IU/L (ref 0–40)
Albumin/Globulin Ratio: 2.6 — ABNORMAL HIGH (ref 1.2–2.2)
Albumin: 4.6 g/dL (ref 3.7–4.7)
Alkaline Phosphatase: 56 IU/L (ref 44–121)
BUN/Creatinine Ratio: 15 (ref 12–28)
BUN: 18 mg/dL (ref 8–27)
Bilirubin Total: 0.3 mg/dL (ref 0.0–1.2)
CO2: 24 mmol/L (ref 20–29)
Calcium: 10.1 mg/dL (ref 8.7–10.3)
Chloride: 104 mmol/L (ref 96–106)
Creatinine, Ser: 1.17 mg/dL — ABNORMAL HIGH (ref 0.57–1.00)
Globulin, Total: 1.8 g/dL (ref 1.5–4.5)
Glucose: 95 mg/dL (ref 65–99)
Potassium: 4.7 mmol/L (ref 3.5–5.2)
Sodium: 141 mmol/L (ref 134–144)
Total Protein: 6.4 g/dL (ref 6.0–8.5)
eGFR: 49 mL/min/{1.73_m2} — ABNORMAL LOW (ref >59)

## 2020-10-04 LAB — LIPID PANEL
Chol/HDL Ratio: 1.9 ratio (ref 0.0–4.4)
Cholesterol, Total: 156 mg/dL (ref 100–199)
HDL: 83 mg/dL (ref >39)
LDL Chol Calc (NIH): 63 mg/dL (ref 0–99)
Triglycerides: 44 mg/dL (ref 0–149)
VLDL Cholesterol Cal: 10 mg/dL (ref 5–40)

## 2020-10-04 LAB — CBC
Hematocrit: 36.3 % (ref 34.0–46.6)
Hemoglobin: 12.2 g/dL (ref 11.1–15.9)
MCH: 30.9 pg (ref 26.6–33.0)
MCHC: 33.6 g/dL (ref 31.5–35.7)
MCV: 92 fL (ref 79–97)
Platelets: 258 10*3/uL (ref 150–450)
RBC: 3.95 x10E6/uL (ref 3.77–5.28)
RDW: 12.3 % (ref 11.7–15.4)
WBC: 4 10*3/uL (ref 3.4–10.8)

## 2020-10-04 LAB — TSH: TSH: 1.77 u[IU]/mL (ref 0.450–4.500)

## 2020-10-04 LAB — T4, FREE: Free T4: 1.62 ng/dL (ref 0.82–1.77)

## 2020-10-04 NOTE — Chronic Care Management (AMB) (Signed)
  Care Management   Follow Up Note   10/04/2020 Name: [name] MRN: [id number] DOB: 05-12-1945   Referred by: [name], MD Reason for referral : Chronic Care Management (RN CM Follow up call - 3rd attempt )   Third unsuccessful telephone outreach was attempted today. The patient was referred to the case management team for assistance with care management and care coordination. The patient's primary care provider has been notified of our unsuccessful attempts to make or maintain contact with the patient. The care management team is pleased to engage with this patient at any time in the future should he/she be interested in assistance from the care management team.   Follow Up Plan: We have been unable to make contact with the patient for follow up. The care management team is available to follow up with the patient after provider conversation with the patient regarding recommendation for care management engagement and subsequent re-referral to the care management team.   Delsa Sale, RN, BSN, CCM Care Management Coordinator [facility] Care Management/[facility]  Direct Phone: [phone]

## 2020-10-04 NOTE — Telephone Encounter (Signed)
Called Ms. Foulk to let her know that her medication came in today for Ozempic 0.25 mg taking it once every other week. Patient voiced understanding. Confirmed that all her medications are not expired 07/24/22 - 2 boxes, 05/15/22- 2 box 2 box 01/15/2021, 1-10/23  Total Time : 5 minutes   [name], PharmD Clinical Pharmacist [facility] [phone]

## 2020-10-05 ENCOUNTER — Telehealth: Payer: Medicare PPO

## 2020-10-05 ENCOUNTER — Telehealth: Payer: Self-pay

## 2020-10-05 NOTE — Telephone Encounter (Signed)
  Care Management   Follow Up Note   10/05/2020 Name: [name] MRN: [id number] DOB: 28-Nov-1945   Referred by: [name], MD Reason for referral : Chronic Care Management (Unsuccessful call)   An unsuccessful telephone outreach was attempted today. The patient was referred to the case management team for assistance with care management and care coordination. SW left a HIPAA compliant voice message requesting a return call.  Follow Up Plan: The care management team will reach out to the patient again over the next 30 days.   [name], BSW, CDP Social Worker, Certified Dementia Practitioner [facility] Care Management [phone]

## 2020-10-06 ENCOUNTER — Telehealth: Payer: Self-pay

## 2020-10-06 NOTE — Telephone Encounter (Signed)
Patient reports that she was waiting for a phone call at 3pm yesterday, but did not receive a call. I explained that her appointment yesterday was with Enrique Sack, SW, and that she has already rescheduled her appointment for 11/02/2020 at 9 AM. Ms. Schier voiced understanding and requested a call from [facility] [place] as well.   Total Time: 5 minutes   [name], PharmD Clinical Pharmacist [facility] [phone]

## 2020-10-07 ENCOUNTER — Ambulatory Visit (INDEPENDENT_AMBULATORY_CARE_PROVIDER_SITE_OTHER): Payer: Medicare PPO

## 2020-10-07 ENCOUNTER — Telehealth: Payer: Medicare PPO

## 2020-10-07 DIAGNOSIS — E039 Hypothyroidism, unspecified: Secondary | ICD-10-CM | POA: Diagnosis not present

## 2020-10-07 DIAGNOSIS — I2510 Atherosclerotic heart disease of native coronary artery without angina pectoris: Secondary | ICD-10-CM | POA: Diagnosis not present

## 2020-10-07 DIAGNOSIS — E7800 Pure hypercholesterolemia, unspecified: Secondary | ICD-10-CM | POA: Diagnosis not present

## 2020-10-07 DIAGNOSIS — E1122 Type 2 diabetes mellitus with diabetic chronic kidney disease: Secondary | ICD-10-CM | POA: Diagnosis not present

## 2020-10-07 DIAGNOSIS — N182 Chronic kidney disease, stage 2 (mild): Secondary | ICD-10-CM | POA: Diagnosis not present

## 2020-10-07 DIAGNOSIS — I10 Essential (primary) hypertension: Secondary | ICD-10-CM | POA: Diagnosis not present

## 2020-10-10 ENCOUNTER — Other Ambulatory Visit: Payer: Self-pay

## 2020-10-12 NOTE — Patient Instructions (Signed)
Goals Addressed      Chronic Kidney disease progression prevented or minimized   On track    Timeframe:  Long-Range Goal Priority:  High Start Date: 10/07/20                            Expected End Date:  10/06/21  Next Scheduled follow up: 02/10/21    Self Care Activities:  Self administers medications as prescribed Attends all scheduled provider appointments Calls pharmacy for medication refills Calls provider office for new concerns or questions Patient Goals: - increase water intake to 64 oz daily - adhere to dietary recommendations                        Monitor and Manage My Blood Sugar-Diabetes Type 2   On track    Timeframe:  Long-Range Goal Priority:  Medium Start Date:  10/07/20                          Expected End Date:  10/06/21                     Follow Up Date 02/10/21    - check blood sugar at prescribed times - check blood sugar before and after exercise - check blood sugar if I feel it is too high or too low - enter blood sugar readings and medication or insulin into daily log - take the blood sugar log to all doctor visits - take the blood sugar meter to all doctor visits    Why is this important?   Checking your blood sugar at home helps to keep it from getting very high or very low.  Writing the results in a diary or log helps the doctor know how to care for you.  Your blood sugar log should have the time, date and the results.  Also, write down the amount of insulin or other medicine that you take.  Other information, like what you ate, exercise done and how you were feeling, will also be helpful.     Notes:      Obtain Eye Exam-Diabetes Type 2   On track    Timeframe:  Long-Range Goal Priority:  Medium Start Date:  10/07/20                           Expected End Date:  10/06/21                      Follow Up Date 02/10/21    - keep appointment with eye doctor - schedule appointment with eye doctor    Why is this important?   Eye check-ups are  important when you have diabetes.  Vision loss can be prevented.    Notes:      Perform Foot Care-Diabetes Type 2   On track    Timeframe:  Long-Range Goal Priority:  Medium Start Date:  10/07/20                           Expected End Date:  10/06/21                     Follow Up Date 02/10/21    - check feet daily for cuts, sores or redness - do heel  pump exercise 2 to 3 times each day - keep feet up while sitting - trim toenails straight across - wash and dry feet carefully every day - wear comfortable, cotton socks - wear comfortable, well-fitting shoes    Why is this important?   Good foot care is very important when you have diabetes.  There are many things you can do to keep your feet healthy and catch a problem early.    Notes:

## 2020-10-12 NOTE — Progress Notes (Signed)
Chronic Care Management   CCM RN Visit Note  10/07/2020 Name: [name] MRN: [id number] DOB: [birth year]  Subjective: [name] is a 75 y.o. year old female who is a primary care patient of Glendale Chard, MD. The care management team was consulted for assistance with disease management and care coordination needs.    Engaged with patient by telephone for follow up visit in response to provider referral for case management and/or care coordination services.   Consent to Services:  The patient was given information about Chronic Care Management services, agreed to services, and gave verbal consent prior to initiation of services.  Please see initial visit note for detailed documentation.   Patient agreed to services and verbal consent obtained.   Assessment: Review of patient past medical history, allergies, medications, health status, including review of consultants reports, laboratory and other test data, was performed as part of comprehensive evaluation and provision of chronic care management services.   SDOH (Social Determinants of Health) assessments and interventions performed:  Yes, no acute challenges   CCM Care Plan  Allergies  Allergen Reactions   Atorvastatin Swelling and Other (See Comments)    Limbs swell   Clopidogrel Other (See Comments)    Caused bruising   Metoprolol Other (See Comments)    Slows heart rate too low   Metoprolol Tartrate Swelling and Other (See Comments)    Site of swelling not noted   Pregabalin Swelling and Other (See Comments)    Site of swelling not noted   Rosuvastatin Calcium Other (See Comments)    Unknown reaction    Outpatient Encounter Medications as of 10/07/2020  Medication Sig Note   albuterol (PROVENTIL HFA;VENTOLIN HFA) 108 (90 Base) MCG/ACT inhaler Inhale 2 puffs into the lungs every 6 (six) hours as needed for wheezing or shortness of breath.    amLODipine (NORVASC) 5 MG tablet Take 5 mg by mouth at bedtime.  1/2 tab (Dr. Doylene Canard)    aspirin EC 81 MG tablet Take 81 mg by mouth daily. 01/31/2020: Patient would not disclose   b complex vitamins tablet Take 1 tablet by mouth daily with lunch.  01/31/2020: Patient would not disclose   Calcium Carb-Cholecalciferol (CALCIUM + D3 PO) Take 1 tablet by mouth at bedtime. 01/31/2020: Patient would not disclose   cholecalciferol (VITAMIN D) 25 MCG (1000 UNIT) tablet Take 1 tablet by mouth daily.  01/31/2020: Patient would not disclose   cilostazol (PLETAL) 100 MG tablet TAKE 1 TABLET EVERY DAY    Evolocumab (REPATHA SURECLICK) 735 MG/ML SOAJ Inject 140 mg into the skin every 14 (fourteen) days.    furosemide (LASIX) 20 MG tablet TAKE 1 TABLET (20 MG TOTAL) BY MOUTH DAILY.    hydrALAZINE (APRESOLINE) 10 MG tablet Take 1 tablet (10 mg total) by mouth every 8 (eight) hours. (Patient taking differently: Take 10 mg by mouth 3 (three) times daily with meals.) 08/09/2020: Per last visit with Dr. Doylene Canard take two tablets twice per day.    isosorbide mononitrate (IMDUR) 60 MG 24 hr tablet TAKE 1 TABLET EVERY DAY    levothyroxine (SYNTHROID) 88 MCG tablet Take 1 tablet (88 mcg total) by mouth daily before breakfast. 1 tab daily    Magnesium 250 MG TABS Take 1 tablet (250 mg total) by mouth at bedtime.    Multiple Vitamins-Minerals (MULTIVITAMIN WITH MINERALS) tablet Take 1 tablet by mouth daily with lunch.     nitroGLYCERIN (NITROSTAT) 0.4 MG SL tablet Place 1 tablet (0.4 mg total) under  the tongue every 5 (five) minutes x 3 doses as needed for chest pain.    olmesartan (BENICAR) 40 MG tablet TAKE 1 TABLET EVERY DAY 08/09/2020: Patient reports taking Olmesartan 20 mg tablet daily per Dr. Doylene Canard.    potassium chloride (K-DUR) 10 MEQ tablet Take 10 mEq by mouth 2 (two) times daily with breakfast and lunch.     Semaglutide (OZEMPIC, 0.25 OR 0.5 MG/DOSE, [place]) Inject 0.25 mg into the skin every Thursday. Every other week (Dr. Doylene Canard) 08/09/2020: Taking once a week on Friday.    No  facility-administered encounter medications on file as of 10/07/2020.    Patient Active Problem List   Diagnosis Date Noted   Hyperlipidemia 10/30/2019   Statin myopathy 10/30/2019   Acute bronchitis 06/06/2018   Type 2 diabetes mellitus with stage 2 chronic kidney disease, without long-term current use of insulin ([place]) 02/17/2018   Chronic renal disease, stage II 02/17/2018   Benign hypertensive heart and renal disease 02/17/2018   Primary hypothyroidism 02/17/2018   Localized osteoarthritis of right knee 02/17/2018   Essential hypertension 06/12/2017   Acute coronary syndrome ([place]) 12/26/2016   Hypertensive heart disease without heart failure 10/06/2015   Insomnia with sleep apnea 10/06/2015   OSA on CPAP 10/06/2015   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 10/06/2015   CAD in native artery 10/06/2015   Chest pain at rest 07/18/2015    Class: Acute   Weakness 07/18/2015    Class: Acute   Insomnia 05/05/2015   Primary snoring 05/05/2015    Conditions to be addressed/monitored: Tpe 2 DM with stage II CKD, Hypothyroidism, Hypercholesterolemia, HTN, CAD  Care Plan : Chronic Kidney (Adult)  Updates made by Lynne Logan, RN since 10/07/2020 12:00 AM     Problem: Disease Progression   Priority: High     Long-Range Goal: Disease Progression Prevented or Minimized   Start Date: 10/07/2020  Expected End Date: 10/06/2021  Priority: High  Note:   Objective:   Lab Results  Component Value Date   CREATININE 1.17 (H) 10/03/2020   CREATININE 1.06 (H) 05/26/2020   CREATININE 1.11 (H) 01/31/2020   Lab Results  Component Value Date   EGFR 49 (L) 10/03/2020  Current Barriers:  Ineffective Self Health Maintenance in a patient with CKD Stage III Clinical Goal(s):  Collaboration with Glendale Chard, MD regarding development and update of comprehensive plan of care as evidenced by provider attestation and co-signature Inter-disciplinary care team collaboration (see longitudinal plan of  care) patient will work with care management team to address care coordination and chronic disease management needs related to Disease Management Educational Needs Care Coordination Medication Management and Education Psychosocial Support   Interventions:  10/07/20 completed successful outbound call with patient  Evaluation of current treatment plan related to CKD Stage III , self-management and patient's adherence to plan as established by provider. Collaboration with Glendale Chard, MD regarding development and update of comprehensive plan of care as evidenced by provider attestation       and co-signature Inter-disciplinary care team collaboration (see longitudinal plan of care) Provided education to patient about basic disease process related to Chronic Kidney disease  Review of patient status, including review of consultant's reports, relevant laboratory and other test results, and medications completed. Reviewed medications with patient and discussed importance of medication adherence Educated on dietary recommendations; Educated on importance to increase water intake to 64 oz per day unless otherwise directed  Mailed printed educational materials related to Chronic Kidney disease  Discussed plans with patient  for ongoing care management follow up and provided patient with direct contact information for care management team Self Care Activities:  Self administers medications as prescribed Attends all scheduled provider appointments Calls pharmacy for medication refills Calls provider office for new concerns or questions Patient Goals: - increase water intake to 64 oz daily - adhere to dietary recommendations   Follow Up Plan: Telephone follow up appointment with care management team member scheduled for: 02/10/21     Care Plan : Diabetes Type 2 (Adult)  Updates made by Lynne Logan, RN since 10/07/2020 12:00 AM     Problem: Glycemic Management (Diabetes, Type 2)   Priority:  Medium     Long-Range Goal: Glycemic Management Optimized   Start Date: 10/07/2020  Expected End Date: 10/06/2021  This Visit's Progress: On track  Priority: Medium  Note:   Objective:  Lab Results  Component Value Date   HGBA1C 6.2 (H) 10/03/2020   Lab Results  Component Value Date   CREATININE 1.17 (H) 10/03/2020   CREATININE 1.06 (H) 05/26/2020   CREATININE 1.11 (H) 01/31/2020   Lab Results  Component Value Date   EGFR 49 (L) 10/03/2020   Current Barriers:  Knowledge Deficits related to basic Diabetes pathophysiology and self care/management Knowledge Deficits related to medications used for management of diabetes Case Manager Clinical Goal(s):  Patient will demonstrate improved adherence to prescribed treatment plan for diabetes self care/management as evidenced by: daily monitoring and recording of CBG  adherence to ADA/ carb modified diet exercise 5 days/week adherence to prescribed medication regimen contacting provider for new or worsened symptoms or questions Interventions:  10/07/20 completed successful outbound call with patient  Collaboration with Glendale Chard, MD regarding development and update of comprehensive plan of care as evidenced by provider attestation and co-signature Inter-disciplinary care team collaboration (see longitudinal plan of care) Provided education to patient about basic DM disease process Review of patient status, including review of consultant's reports, relevant laboratory and other test results, and medications completed. Reviewed medications with patient and discussed importance of medication adherence Educated patient on dietary and exercise recommendations; daily glycemic control FBS 80-130, <180 after meals;15'15' rule Advised patient, providing education and rationale, to check cbg daily before meals and at bedtime and record, calling the CCM team and or PCP for findings outside established parameters Discussed plans with patient for  ongoing care management follow up and provided patient with direct contact information for care management team Self-Care Activities Self administers oral medications as prescribed Attends all scheduled provider appointments Checks blood sugars as prescribed and utilize hyper and hypoglycemia protocol as needed Adheres to prescribed ADA/carb modified Patient Goals: - check blood sugar at prescribed times - check blood sugar before and after exercise - check blood sugar if I feel it is too high or too low - enter blood sugar readings and medication or insulin into daily log - take the blood sugar log to all doctor visits - take the blood sugar meter to all doctor visits - manage portion size - keep appointment with eye doctor - schedule appointment with eye doctor - check feet daily for cuts, sores or redness - do heel pump exercise 2 to 3 times each day - keep feet up while sitting - trim toenails straight across - wash and dry feet carefully every day - wear comfortable, cotton socks - wear comfortable, well-fitting shoes  Follow Up Plan: Telephone follow up appointment with care management team member scheduled for: 02/10/21     Plan:Telephone  follow up appointment with care management team member scheduled for:  02/10/21  Barb Merino, RN, BSN, CCM Care Management Coordinator [facility]  Direct Phone: [phone]

## 2020-10-20 NOTE — Chronic Care Management (AMB) (Signed)
    Chronic Care Management Pharmacy Assistant   Name: [name]  MRN: [id number] DOB: 05/26/1945  Reason for Encounter: Patient Assistance Coordination   10/04/2020- Patient assistance reorder form filled out for Ozempic with [employer] patient assistance program. Form printed, awaiting provider signature to fax.  10/07/2020- Reorder form received, faxed to [employer] Patient assistance program.    Medications: Outpatient Encounter Medications as of 10/04/2020  Medication Sig Note   albuterol (PROVENTIL HFA;VENTOLIN HFA) 108 (90 Base) MCG/ACT inhaler Inhale 2 puffs into the lungs every 6 (six) hours as needed for wheezing or shortness of breath.    amLODipine (NORVASC) 5 MG tablet Take 5 mg by mouth at bedtime. 1/2 tab (Dr. Algie Coffer)    aspirin EC 81 MG tablet Take 81 mg by mouth daily. 01/31/2020: Patient would not disclose   b complex vitamins tablet Take 1 tablet by mouth daily with lunch.  01/31/2020: Patient would not disclose   Calcium Carb-Cholecalciferol (CALCIUM + D3 PO) Take 1 tablet by mouth at bedtime. 01/31/2020: Patient would not disclose   cholecalciferol (VITAMIN D) 25 MCG (1000 UNIT) tablet Take 1 tablet by mouth daily.  01/31/2020: Patient would not disclose   cilostazol (PLETAL) 100 MG tablet TAKE 1 TABLET EVERY DAY    Evolocumab (REPATHA SURECLICK) 140 MG/ML SOAJ Inject 140 mg into the skin every 14 (fourteen) days.    furosemide (LASIX) 20 MG tablet TAKE 1 TABLET (20 MG TOTAL) BY MOUTH DAILY.    hydrALAZINE (APRESOLINE) 10 MG tablet Take 1 tablet (10 mg total) by mouth every 8 (eight) hours. (Patient taking differently: Take 10 mg by mouth 3 (three) times daily with meals.) 08/09/2020: Per last visit with Dr. Algie Coffer take two tablets twice per day.    isosorbide mononitrate (IMDUR) 60 MG 24 hr tablet TAKE 1 TABLET EVERY DAY    levothyroxine (SYNTHROID) 88 MCG tablet Take 1 tablet (88 mcg total) by mouth daily before breakfast. 1 tab daily    Magnesium 250 MG  TABS Take 1 tablet (250 mg total) by mouth at bedtime.    Multiple Vitamins-Minerals (MULTIVITAMIN WITH MINERALS) tablet Take 1 tablet by mouth daily with lunch.     nitroGLYCERIN (NITROSTAT) 0.4 MG SL tablet Place 1 tablet (0.4 mg total) under the tongue every 5 (five) minutes x 3 doses as needed for chest pain.    olmesartan (BENICAR) 40 MG tablet TAKE 1 TABLET EVERY DAY 08/09/2020: Patient reports taking Olmesartan 20 mg tablet daily per Dr. Algie Coffer.    potassium chloride (K-DUR) 10 MEQ tablet Take 10 mEq by mouth 2 (two) times daily with breakfast and lunch.     Semaglutide (OZEMPIC, 0.25 OR 0.5 MG/DOSE, [place]) Inject 0.25 mg into the skin every Thursday. Every other week (Dr. Algie Coffer) 08/09/2020: Taking once a week on Friday.    No facility-administered encounter medications on file as of 10/04/2020.    Care Gaps: Zoster Vaccines- Shingrix- Overdue FOOT EXAM (Yearly)- Last completed: Nov 13, 2018  OPHTHALMOLOGY EXAM (Yearly)- Last completed: Feb 27, 2019 COVID-19 Vaccine (4 - Booster for [employer] series)  Annual Wellness Exam Completed 05/26/2020, next scheduled for 05/31/2021  Follow up visit with CCM Pharmacist [name] on 11/03/2020 @ 2pm.   Star Rating Drugs: Ozempic- Last filled 2020 (PAP) Olmesartan 40 mg- Last filled 01/22/2020- (GAP)  Billee Cashing, CMA Clinical Pharmacist Assistant [phone]

## 2020-10-31 ENCOUNTER — Telehealth: Payer: Self-pay

## 2020-10-31 NOTE — Chronic Care Management (AMB) (Signed)
Chronic Care Management Pharmacy Assistant   Name: [name]  MRN: [id number] DOB: 01/11/1946  Reason for Encounter: Disease State/ Diabetes, Hypertension  Recent office visits:  10-03-2020 Catherine Peng, MD. STOP Dulcolax 5 mg.  10-04-2020 [name], Catherine Lew, RN (CCM)  10-07-2020 [name], Catherine Lew, RN (CCM)  Recent consult visits:  None  Hospital visits:  None in previous 6 months  Medications: Outpatient Encounter Medications as of 10/31/2020  Medication Sig Note   albuterol (PROVENTIL HFA;VENTOLIN HFA) 108 (90 Base) MCG/ACT inhaler Inhale 2 puffs into the lungs every 6 (six) hours as needed for wheezing or shortness of breath.    amLODipine (NORVASC) 5 MG tablet Take 5 mg by mouth at bedtime. 1/2 tab (Dr. Algie Coffer)    aspirin EC 81 MG tablet Take 81 mg by mouth daily. 01/31/2020: Patient would not disclose   b complex vitamins tablet Take 1 tablet by mouth daily with lunch.  01/31/2020: Patient would not disclose   Calcium Carb-Cholecalciferol (CALCIUM + D3 PO) Take 1 tablet by mouth at bedtime. 01/31/2020: Patient would not disclose   cholecalciferol (VITAMIN D) 25 MCG (1000 UNIT) tablet Take 1 tablet by mouth daily.  01/31/2020: Patient would not disclose   cilostazol (PLETAL) 100 MG tablet TAKE 1 TABLET EVERY DAY    Evolocumab (REPATHA SURECLICK) 140 MG/ML SOAJ Inject 140 mg into the skin every 14 (fourteen) days.    furosemide (LASIX) 20 MG tablet TAKE 1 TABLET (20 MG TOTAL) BY MOUTH DAILY.    hydrALAZINE (APRESOLINE) 10 MG tablet Take 1 tablet (10 mg total) by mouth every 8 (eight) hours. (Patient taking differently: Take 10 mg by mouth 3 (three) times daily with meals.) 08/09/2020: Per last visit with Dr. Algie Coffer take two tablets twice per day.    isosorbide mononitrate (IMDUR) 60 MG 24 hr tablet TAKE 1 TABLET EVERY DAY    levothyroxine (SYNTHROID) 88 MCG tablet Take 1 tablet (88 mcg total) by mouth daily before breakfast. 1 tab daily    Magnesium 250 MG TABS Take 1  tablet (250 mg total) by mouth at bedtime.    Multiple Vitamins-Minerals (MULTIVITAMIN WITH MINERALS) tablet Take 1 tablet by mouth daily with lunch.     nitroGLYCERIN (NITROSTAT) 0.4 MG SL tablet Place 1 tablet (0.4 mg total) under the tongue every 5 (five) minutes x 3 doses as needed for chest pain.    olmesartan (BENICAR) 40 MG tablet TAKE 1 TABLET EVERY DAY 08/09/2020: Patient reports taking Olmesartan 20 mg tablet daily per Dr. Algie Coffer.    potassium chloride (K-DUR) 10 MEQ tablet Take 10 mEq by mouth 2 (two) times daily with breakfast and lunch.     Semaglutide (OZEMPIC, 0.25 OR 0.5 MG/DOSE, [place]) Inject 0.25 mg into the skin every Thursday. Every other week (Dr. Algie Coffer) 08/09/2020: Taking once a week on Friday.    No facility-administered encounter medications on file as of 10/31/2020.   Recent Relevant Labs: Lab Results  Component Value Date/Time   HGBA1C 6.2 (H) 10/03/2020 12:18 PM   HGBA1C 6.0 (H) 05/26/2020 11:51 AM   MICROALBUR 10 05/26/2020 02:40 PM   MICROALBUR 10 05/27/2019 11:30 AM    Kidney Function Lab Results  Component Value Date/Time   CREATININE 1.17 (H) 10/03/2020 12:18 PM   CREATININE 1.06 (H) 05/26/2020 11:51 AM   GFRNONAA 52 (L) 01/31/2020 10:44 AM   GFRAA 55 (L) 09/30/2019 10:48 AM    Current antihyperglycemic regimen:  Ozempic 0.25 mg every other week  What recent interventions/DTPs have  been made to improve glycemic control:  Educated on A1c and blood sugar goals Exercise goal of 150 minutes per week Counseled to check feet daily and get yearly eye exams  Have there been any recent hospitalizations or ED visits since last visit with CPP? No  Patient denies hypoglycemic symptoms  Patient denies hyperglycemic symptoms  How often are you checking your blood sugar? once daily What are your blood sugars ranging?  Fasting: 112, 125 Before meals: none After meals: none Bedtime: none During the week, how often does your blood glucose drop below 70?  Never  Are you checking your feet daily/regularly? Patient states daily  Adherence Review: Is the patient currently on a STATIN medication? No Is the patient currently on ACE/ARB medication? Yes Does the patient have >5 day gap between last estimated fill dates? No  Reviewed chart prior to disease state call. Spoke with patient regarding BP  Recent Office Vitals: BP Readings from Last 3 Encounters:  10/03/20 120/60  05/26/20 120/60  05/26/20 120/60   Pulse Readings from Last 3 Encounters:  10/03/20 (!) 54  05/26/20 (!) 53  05/26/20 (!) 53    Wt Readings from Last 3 Encounters:  10/03/20 144 lb 6.4 oz (65.5 kg)  05/26/20 137 lb 6.4 oz (62.3 kg)  05/26/20 137 lb 6.4 oz (62.3 kg)     Kidney Function Lab Results  Component Value Date/Time   CREATININE 1.17 (H) 10/03/2020 12:18 PM   CREATININE 1.06 (H) 05/26/2020 11:51 AM   GFRNONAA 52 (L) 01/31/2020 10:44 AM   GFRAA 55 (L) 09/30/2019 10:48 AM    BMP Latest Ref Rng & Units 10/03/2020 05/26/2020 01/31/2020  Glucose 65 - 99 mg/dL 95 161(W) 960(A)  BUN 8 - 27 mg/dL 18 10 10   Creatinine 0.57 - 1.00 mg/dL ) 5.40(J) 8.11(B)  BUN/Creat Ratio 12 - 28 15 9(L) -  Sodium 134 - 144 mmol/L 141 140 139  Potassium 3.5 - 5.2 mmol/L 4.7 4.4 4.4  Chloride 96 - 106 mmol/L 104 104 105  CO2 20 - 29 mmol/L 24 22 24   Calcium 8.7 - 10.3 mg/dL 1.47(W 29.5    Current antihypertensive regimen:  Isosorbide Mononitrate 60 mg daily Olmesartan 20 mg daily Hydralazine 10 mg two tablets twice daily  How often are you checking your Blood Pressure? daily  Current home BP readings: 105/50  What recent interventions/DTPs have been made by any provider to improve Blood Pressure control since last CPP Visit:  Educated on Daily salt intake goal < 2300 mg Exercise goal of 150 minutes per week Symptoms of hypotension and importance of maintaining adequate hydration  Any recent hospitalizations or ED visits since last visit with CPP? No  What  diet changes have been made to improve Blood Pressure Control?  Patient stated she has limited salt intake and drinks 6-7 bottles of water daily'  What exercise is being done to improve your Blood Pressure Control?  Patient states she walks on her treadmill at home.   Adherence Review: Is the patient currently on ACE/ARB medication? Yes Does the patient have >5 day gap between last estimated fill dates? No  NOTES: Rescheduled patients 11-03-2020 appointment to September per 01-03-2021 CPP.  Care Gaps: Medicare wellness 05-31-2021 Shingrix overdue Yearly Ophthalmology overdue Covid booster overdue  Star Rating Drugs: Ozempic 0.25 mg- Patient Assistance Olmesartan 40 mg- Last filled 06-18-2020 90 DS [employer] (Patient breaks pills in half to make 20 mg)  07-31-2021 [facility] Clinical Pharmacist Assistant [phone]

## 2020-11-02 ENCOUNTER — Ambulatory Visit (INDEPENDENT_AMBULATORY_CARE_PROVIDER_SITE_OTHER): Payer: Medicare PPO

## 2020-11-02 DIAGNOSIS — E1122 Type 2 diabetes mellitus with diabetic chronic kidney disease: Secondary | ICD-10-CM

## 2020-11-02 DIAGNOSIS — N182 Chronic kidney disease, stage 2 (mild): Secondary | ICD-10-CM

## 2020-11-02 DIAGNOSIS — I10 Essential (primary) hypertension: Secondary | ICD-10-CM

## 2020-11-02 NOTE — Chronic Care Management (AMB) (Signed)
Chronic Care Management    Social Work Note  11/02/2020 Name: [name] MRN: [id number] DOB: 1945/07/25  [name] is a 75 y.o. year old female who is a primary care patient of [name], MD. The CCM team was consulted to assist the patient with chronic disease management and/or care coordination needs related to:  DM II, HTN, and CKD II .   Engaged with patient by telephone for follow up visit in response to provider referral for social work chronic care management and care coordination services.   Consent to Services:  The patient was given information about Chronic Care Management services, agreed to services, and gave verbal consent prior to initiation of services.  Please see initial visit note for detailed documentation.   Patient agreed to services and consent obtained.   Assessment: Review of patient past medical history, allergies, medications, and health status, including review of relevant consultants reports was performed today as part of a comprehensive evaluation and provision of chronic care management and care coordination services.     SDOH (Social Determinants of Health) assessments and interventions performed:  SDOH Interventions    Flowsheet Row Most Recent Value  SDOH Interventions   Food Insecurity Interventions Intervention Not Indicated  Housing Interventions Intervention Not Indicated  Transportation Interventions Intervention Not Indicated        Advanced Directives Status: Not addressed in this encounter.  CCM Care Plan  Allergies  Allergen Reactions   Atorvastatin Swelling and Other (See Comments)    Limbs swell   Clopidogrel Other (See Comments)    Caused bruising   Metoprolol Other (See Comments)    Slows heart rate too low   Metoprolol Tartrate Swelling and Other (See Comments)    Site of swelling not noted   Pregabalin Swelling and Other (See Comments)    Site of swelling not noted   Rosuvastatin Calcium Other (See  Comments)    Unknown reaction    Outpatient Encounter Medications as of 11/02/2020  Medication Sig Note   albuterol (PROVENTIL HFA;VENTOLIN HFA) 108 (90 Base) MCG/ACT inhaler Inhale 2 puffs into the lungs every 6 (six) hours as needed for wheezing or shortness of breath.    amLODipine (NORVASC) 5 MG tablet Take 5 mg by mouth at bedtime. 1/2 tab (Dr. Algie Coffer)    aspirin EC 81 MG tablet Take 81 mg by mouth daily. 01/31/2020: Patient would not disclose   b complex vitamins tablet Take 1 tablet by mouth daily with lunch.  01/31/2020: Patient would not disclose   Calcium Carb-Cholecalciferol (CALCIUM + D3 PO) Take 1 tablet by mouth at bedtime. 01/31/2020: Patient would not disclose   cholecalciferol (VITAMIN D) 25 MCG (1000 UNIT) tablet Take 1 tablet by mouth daily.  01/31/2020: Patient would not disclose   cilostazol (PLETAL) 100 MG tablet TAKE 1 TABLET EVERY DAY    Evolocumab (REPATHA SURECLICK) 140 MG/ML SOAJ Inject 140 mg into the skin every 14 (fourteen) days.    furosemide (LASIX) 20 MG tablet TAKE 1 TABLET (20 MG TOTAL) BY MOUTH DAILY.    hydrALAZINE (APRESOLINE) 10 MG tablet Take 1 tablet (10 mg total) by mouth every 8 (eight) hours. (Patient taking differently: Take 10 mg by mouth 3 (three) times daily with meals.) 08/09/2020: Per last visit with Dr. Algie Coffer take two tablets twice per day.    isosorbide mononitrate (IMDUR) 60 MG 24 hr tablet TAKE 1 TABLET EVERY DAY    levothyroxine (SYNTHROID) 88 MCG tablet Take 1 tablet (88 mcg total)  by mouth daily before breakfast. 1 tab daily    Magnesium 250 MG TABS Take 1 tablet (250 mg total) by mouth at bedtime.    Multiple Vitamins-Minerals (MULTIVITAMIN WITH MINERALS) tablet Take 1 tablet by mouth daily with lunch.     nitroGLYCERIN (NITROSTAT) 0.4 MG SL tablet Place 1 tablet (0.4 mg total) under the tongue every 5 (five) minutes x 3 doses as needed for chest pain.    olmesartan (BENICAR) 40 MG tablet TAKE 1 TABLET EVERY DAY 08/09/2020: Patient reports  taking Olmesartan 20 mg tablet daily per Dr. Algie Coffer.    potassium chloride (K-DUR) 10 MEQ tablet Take 10 mEq by mouth 2 (two) times daily with breakfast and lunch.     Semaglutide (OZEMPIC, 0.25 OR 0.5 MG/DOSE, [place]) Inject 0.25 mg into the skin every Thursday. Every other week (Dr. Algie Coffer) 08/09/2020: Taking once a week on Friday.    No facility-administered encounter medications on file as of 11/02/2020.    Patient Active Problem List   Diagnosis Date Noted   Hyperlipidemia 10/30/2019   Statin myopathy 10/30/2019   Acute bronchitis 06/06/2018   Type 2 diabetes mellitus with stage 2 chronic kidney disease, without long-term current use of insulin (HCC) 02/17/2018   Chronic renal disease, stage II 02/17/2018   Benign hypertensive heart and renal disease 02/17/2018   Primary hypothyroidism 02/17/2018   Localized osteoarthritis of right knee 02/17/2018   Essential hypertension 06/12/2017   Acute coronary syndrome (HCC) 12/26/2016   Hypertensive heart disease without heart failure 10/06/2015   Insomnia with sleep apnea 10/06/2015   OSA on CPAP 10/06/2015   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 10/06/2015   CAD in native artery 10/06/2015   Chest pain at rest 07/18/2015    Class: Acute   Weakness 07/18/2015    Class: Acute   Insomnia 05/05/2015   Primary snoring 05/05/2015    Conditions to be addressed/monitored: HTN, DMII, and CKD Stage II  Care Plan : Social Work [facility] Care Plan  Updates made by [name] since 11/02/2020 12:00 AM  Completed 11/02/2020   Problem: Barriers to Treatment Resolved 11/02/2020     Long-Range Goal: Barriers to Treatment Identified and Managed Completed 11/02/2020  Start Date: 05/03/2020  Expected End Date: 08/31/2020  Recent Progress: On track  Priority: Low  Note:   Current Barriers:  Chronic disease management support and education needs related to HTN, DM, and CKD Stage II    Social Worker Clinical Goal(s):  Over the next 120 days, patient will work  with SW to identify and address any acute and/or chronic care coordination needs related to the self health management of HTN, DM, and CKD Stage II    CCM SW Interventions:  Inter-disciplinary care team collaboration (see longitudinal plan of care) Collaboration with [name], MD regarding development and update of comprehensive plan of care as evidenced by provider attestation and co-signature Successful outbound call placed to the patient to assess for care coordination needs Conducted an [facility] screen - no resource needs identified at this time Discussed plans for SW closure while the patient remains active with Medical illustrator and Pharmacist - patient agreeable to plan Encouraged the patient to contact SW as needed   Patient Goals/Self-Care Activities  patient will:   - Engage with Medical illustrator and Pharmacist to address care management needs -Contact SW as needed        Follow Up Plan:  No SW follow up planned at this time. The patient will remain engaged with  RN Care Manager and Pharmacist.      [name], BSW, CDP Social Worker, Certified Dementia Practitioner [facility] Care Management [phone]

## 2020-11-02 NOTE — Patient Instructions (Signed)
Social Worker Visit Information  Goals we discussed today:   Goals Addressed             This Visit's Progress    COMPLETED: Work with SW to manage care coordination needs       Timeframe:  Long-Range Goal Priority:  Low Start Date:  2.8.22                           Expected End Date:   6.8.22                    Patient Goals/Self-Care Activities  patient will:   - Engage with Medical illustrator and Pharmacist to address care management needs -Contact SW as needed         Follow Up Plan: No follow up planned at this time. Please contact me as needed.   [name], BSW, CDP Social Worker, Certified Dementia Practitioner [facility] Care Management [phone]

## 2020-11-03 ENCOUNTER — Telehealth: Payer: Self-pay

## 2020-11-08 DIAGNOSIS — E119 Type 2 diabetes mellitus without complications: Secondary | ICD-10-CM | POA: Diagnosis not present

## 2020-11-08 DIAGNOSIS — H2513 Age-related nuclear cataract, bilateral: Secondary | ICD-10-CM | POA: Diagnosis not present

## 2020-11-08 DIAGNOSIS — H16223 Keratoconjunctivitis sicca, not specified as Sjogren's, bilateral: Secondary | ICD-10-CM | POA: Diagnosis not present

## 2020-11-08 DIAGNOSIS — H401131 Primary open-angle glaucoma, bilateral, mild stage: Secondary | ICD-10-CM | POA: Diagnosis not present

## 2020-11-09 ENCOUNTER — Other Ambulatory Visit: Payer: Self-pay | Admitting: Internal Medicine

## 2020-11-10 DIAGNOSIS — E876 Hypokalemia: Secondary | ICD-10-CM | POA: Diagnosis not present

## 2020-11-14 ENCOUNTER — Telehealth: Payer: Medicare PPO

## 2020-11-14 ENCOUNTER — Other Ambulatory Visit: Payer: Self-pay

## 2020-11-14 ENCOUNTER — Ambulatory Visit: Payer: Self-pay

## 2020-11-14 DIAGNOSIS — E7800 Pure hypercholesterolemia, unspecified: Secondary | ICD-10-CM | POA: Diagnosis not present

## 2020-11-14 DIAGNOSIS — N182 Chronic kidney disease, stage 2 (mild): Secondary | ICD-10-CM

## 2020-11-14 DIAGNOSIS — I10 Essential (primary) hypertension: Secondary | ICD-10-CM

## 2020-11-14 DIAGNOSIS — E039 Hypothyroidism, unspecified: Secondary | ICD-10-CM | POA: Diagnosis not present

## 2020-11-14 DIAGNOSIS — I2510 Atherosclerotic heart disease of native coronary artery without angina pectoris: Secondary | ICD-10-CM

## 2020-11-14 DIAGNOSIS — E1122 Type 2 diabetes mellitus with diabetic chronic kidney disease: Secondary | ICD-10-CM | POA: Diagnosis not present

## 2020-11-14 MED ORDER — REPATHA SURECLICK 140 MG/ML ~~LOC~~ SOAJ: 140.0000 mg | SUBCUTANEOUS | 6 refills | Status: DC

## 2020-11-14 NOTE — Chronic Care Management (AMB) (Signed)
Chronic Care Management   CCM RN Visit Note  11/14/2020 Name: [name] MRN: [id number] DOB: [birth year]  Subjective: [name] is a 75 y.o. year old female who is a primary care patient of [name], MD. The care management team was consulted for assistance with disease management and care coordination needs.    Engaged with patient by telephone for follow up visit in response to provider referral for case management and/or care coordination services.   Consent to Services:  The patient was given information about Chronic Care Management services, agreed to services, and gave verbal consent prior to initiation of services.  Please see initial visit note for detailed documentation.   Patient agreed to services and verbal consent obtained.   Assessment: Review of patient past medical history, allergies, medications, health status, including review of consultants reports, laboratory and other test data, was performed as part of comprehensive evaluation and provision of chronic care management services.   SDOH (Social Determinants of Health) assessments and interventions performed:  Yes, no acute challenges   CCM Care Plan  Allergies  Allergen Reactions   Atorvastatin Swelling and Other (See Comments)    Limbs swell   Clopidogrel Other (See Comments)    Caused bruising   Metoprolol Other (See Comments)    Slows heart rate too low   Metoprolol Tartrate Swelling and Other (See Comments)    Site of swelling not noted   Pregabalin Swelling and Other (See Comments)    Site of swelling not noted   Rosuvastatin Calcium Other (See Comments)    Unknown reaction    Outpatient Encounter Medications as of 11/14/2020  Medication Sig Note   albuterol (PROVENTIL HFA;VENTOLIN HFA) 108 (90 Base) MCG/ACT inhaler Inhale 2 puffs into the lungs every 6 (six) hours as needed for wheezing or shortness of breath.    amLODipine (NORVASC) 5 MG tablet Take 5 mg by mouth at bedtime.  1/2 tab (Dr. Algie Coffer)    aspirin EC 81 MG tablet Take 81 mg by mouth daily. 01/31/2020: Patient would not disclose   b complex vitamins tablet Take 1 tablet by mouth daily with lunch.  01/31/2020: Patient would not disclose   Calcium Carb-Cholecalciferol (CALCIUM + D3 PO) Take 1 tablet by mouth at bedtime. 01/31/2020: Patient would not disclose   cholecalciferol (VITAMIN D) 25 MCG (1000 UNIT) tablet Take 1 tablet by mouth daily.  01/31/2020: Patient would not disclose   cilostazol (PLETAL) 100 MG tablet TAKE 1 TABLET EVERY DAY    Evolocumab (REPATHA SURECLICK) 140 MG/ML SOAJ Inject 140 mg into the skin every 14 (fourteen) days.    furosemide (LASIX) 20 MG tablet TAKE 1 TABLET (20 MG TOTAL) BY MOUTH DAILY.    hydrALAZINE (APRESOLINE) 10 MG tablet Take 1 tablet (10 mg total) by mouth every 8 (eight) hours. (Patient taking differently: Take 10 mg by mouth 3 (three) times daily with meals.) 08/09/2020: Per last visit with Dr. Algie Coffer take two tablets twice per day.    isosorbide mononitrate (IMDUR) 60 MG 24 hr tablet TAKE 1 TABLET EVERY DAY    levothyroxine (SYNTHROID) 88 MCG tablet Take 1 tablet (88 mcg total) by mouth daily before breakfast. 1 tab daily    Magnesium 250 MG TABS Take 1 tablet (250 mg total) by mouth at bedtime.    Multiple Vitamins-Minerals (MULTIVITAMIN WITH MINERALS) tablet Take 1 tablet by mouth daily with lunch.     nitroGLYCERIN (NITROSTAT) 0.4 MG SL tablet PLACE 1 TABLET UNDER THE TONGUE EVERY  5 MINUTES X 3 DOSES AS NEEDED FOR CHEST PAIN.    olmesartan (BENICAR) 40 MG tablet TAKE 1 TABLET EVERY DAY 08/09/2020: Patient reports taking Olmesartan 20 mg tablet daily per Dr. Algie Coffer.    potassium chloride (K-DUR) 10 MEQ tablet Take 10 mEq by mouth 2 (two) times daily with breakfast and lunch.     Semaglutide (OZEMPIC, 0.25 OR 0.5 MG/DOSE, [place]) Inject 0.25 mg into the skin every Thursday. Every other week (Dr. Algie Coffer) 08/09/2020: Taking once a week on Friday.    No facility-administered  encounter medications on file as of 11/14/2020.    Patient Active Problem List   Diagnosis Date Noted   Hyperlipidemia 10/30/2019   Statin myopathy 10/30/2019   Acute bronchitis 06/06/2018   Type 2 diabetes mellitus with stage 2 chronic kidney disease, without long-term current use of insulin (HCC) 02/17/2018   Chronic renal disease, stage II 02/17/2018   Benign hypertensive heart and renal disease 02/17/2018   Primary hypothyroidism 02/17/2018   Localized osteoarthritis of right knee 02/17/2018   Essential hypertension 06/12/2017   Acute coronary syndrome (HCC) 12/26/2016   Hypertensive heart disease without heart failure 10/06/2015   Insomnia with sleep apnea 10/06/2015   OSA on CPAP 10/06/2015   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 10/06/2015   CAD in native artery 10/06/2015   Chest pain at rest 07/18/2015    Class: Acute   Weakness 07/18/2015    Class: Acute   Insomnia 05/05/2015   Primary snoring 05/05/2015    Conditions to be addressed/monitored: Type 2 DM with stage II CKD, Hypothyroidism, Hypercholesterolemia, HTN, CAD  Care Plan : Wellness (Adult)  Updates made by Riley Churches, RN since 11/14/2020 12:00 AM     Problem: Medication Adherence (Wellness)   Priority: High     Goal: Medication Adherence Maintained   Start Date: 11/14/2020  Expected End Date: 02/14/2021  This Visit's Progress: On track  Priority: High  Note:   Current Barriers:  Ineffective Self Health Maintenance in a patient with Tpe 2 DM with stage II CKD, Hypothyroidism, Hypercholesterolemia, HTN, CAD Clinical Goal(s):  Collaboration with [name], MD regarding development and update of comprehensive plan of care as evidenced by provider attestation and co-signature Inter-disciplinary care team collaboration (see longitudinal plan of care) patient will work with care management team to address care coordination and chronic disease management needs related to Disease Management Educational  Needs Care Coordination Medication Management and Education Psychosocial Support   Interventions:  11/14/20 completed inbound call from patient  Evaluation of current treatment plan related to Tpe 2 DM with stage II CKD, Hypothyroidism, Hypercholesterolemia, HTN, CAD, self-management and patient's adherence to plan as established by provider. Collaboration with [name], MD regarding development and update of comprehensive plan of care as evidenced by provider attestation       and co-signature Inter-disciplinary care team collaboration (see longitudinal plan of care) Discussed patient has been unable to obtain her refills for Repatha Reviewed chart and noted last Rx for Repatha was sent by PCP to [facility] with 3 refills sent on 06/30/20 Completed joint call with patient and [facility] pharmacy team member to learn this Rx was transferred to [facility] on [address] in June 2022 Determined patient prefers her prescriptions be sent to this [facility] and will plan to use this pharmacy as her primary pharmacy moving forward Collaborated with PCP requesting a refill be sent to her preferred pharmacy, advised patient's next injection is due this Thursday, PCP replied with okay to  send Rx per CMA with 6 refills, patient advised  Discussed plans with patient for ongoing care management follow up and provided patient with direct contact information for care management team Self Care Activities:  Self administers medications as prescribed Attends all scheduled provider appointments Calls pharmacy for medication refills Calls provider office for new concerns or questions Patient Goals: - maintain medication adherence   Follow Up Plan: Telephone follow up appointment with care management team member scheduled for: 02/10/21     Care Plan : Chronic Constipation  Updates made by Riley Churches, RN since 11/14/2020 12:00 AM     Problem: Chronic Constipation   Priority: Medium      Long-Range Goal: Chronic Constipation complications prevented or minimized   Start Date: 11/14/2020  Expected End Date: 11/14/2021  This Visit's Progress: On track  Priority: Medium  Note:   Current Barriers:  Ineffective Self Health Maintenance in a patient with Tpe 2 DM with stage II CKD, Hypothyroidism, Hypercholesterolemia, HTN, CAD Clinical Goal(s):  Collaboration with [name], MD regarding development and update of comprehensive plan of care as evidenced by provider attestation and co-signature Inter-disciplinary care team collaboration (see longitudinal plan of care) patient will work with care management team to address care coordination and chronic disease management needs related to Disease Management Educational Needs Care Coordination Medication Management and Education Psychosocial Support   Interventions:  11/14/20 completed inbound call with patient  Evaluation of current treatment plan related to Tpe 2 DM with stage II CKD, Hypothyroidism, Hypercholesterolemia, HTN, CAD, self-management and patient's adherence to plan as established by provider. Collaboration with [name], MD regarding development and update of comprehensive plan of care as evidenced by provider attestation       and co-signature Inter-disciplinary care team collaboration (see longitudinal plan of care) Determined patient is experiencing chronic constipation with minimal relief from OTC Senokot, prunes and prunelax  Educated patient on way to prevent/treat constipation including; Include plenty of high-fiber foods in your diet, including beans, vegetables, fruits, whole grain cereals and bran. - Eat fewer foods with low amounts of fiber such as processed foods, and dairy and meat products. - Drink plenty of fluids. - Stay as active as possible and try to get regular exercise. - Try to manage stress. - Don't ignore the urge to pass stool. - Try to create a regular schedule for bowel  movements, especially after a meal. Discussed plans with patient for ongoing care management follow up and provided patient with direct contact information for care management team Self Care Activities:  Self administers medications as prescribed Attends all scheduled provider appointments Calls pharmacy for medication refills Calls provider office for new concerns or questions Patient Goals: - Prevent complications from constipation  - Include plenty of high-fiber foods in your diet, including beans, vegetables, fruits, whole grain cereals and bran. - Eat fewer foods with low amounts of fiber such as processed foods, and dairy and meat products. - Drink plenty of fluids. - Stay as active as possible and try to get regular exercise. - Try to manage stress. - Don't ignore the urge to pass stool. - Try to create a regular schedule for bowel movements, especially after a meal.  Follow Up Plan: Telephone follow up appointment with care management team member scheduled for: 02/10/21    Plan:Telephone follow up appointment with care management team member scheduled for:  02/10/21  Delsa Sale, RN, BSN, CCM Care Management Coordinator [facility] Care Management/[facility]  Direct Phone: [phone]

## 2020-11-16 ENCOUNTER — Ambulatory Visit: Payer: Self-pay

## 2020-11-16 ENCOUNTER — Telehealth: Payer: Medicare PPO

## 2020-11-16 ENCOUNTER — Telehealth: Payer: Self-pay | Admitting: Cardiovascular Disease

## 2020-11-16 DIAGNOSIS — I10 Essential (primary) hypertension: Secondary | ICD-10-CM

## 2020-11-16 DIAGNOSIS — N182 Chronic kidney disease, stage 2 (mild): Secondary | ICD-10-CM

## 2020-11-16 DIAGNOSIS — E039 Hypothyroidism, unspecified: Secondary | ICD-10-CM

## 2020-11-16 DIAGNOSIS — I2510 Atherosclerotic heart disease of native coronary artery without angina pectoris: Secondary | ICD-10-CM

## 2020-11-16 DIAGNOSIS — E7800 Pure hypercholesterolemia, unspecified: Secondary | ICD-10-CM

## 2020-11-16 DIAGNOSIS — E1122 Type 2 diabetes mellitus with diabetic chronic kidney disease: Secondary | ICD-10-CM

## 2020-11-16 MED ORDER — REPATHA SURECLICK 140 MG/ML ~~LOC~~ SOAJ: 140.0000 mg | SUBCUTANEOUS | 0 refills | Status: DC

## 2020-11-16 NOTE — Telephone Encounter (Signed)
If the pharmacy is saying it's a refill too soon, then why does she need a dose.  Price quoted looks like 3 month supply.  Find out when next refill due per pharmacy, then on that day we can determine 28 day supply cost.  Go ahead and get 1 sample pen ready for her, but let her know that we need to have Safety Net filled out, as there are only a few samples, and most patients are in this same position.

## 2020-11-16 NOTE — Telephone Encounter (Signed)
Called and spoke to pharmacy and they stated that they gave her the ash price as they cannot process a refill to soon. I need permission to give a sample so I will route to the pharmd pool

## 2020-11-16 NOTE — Telephone Encounter (Signed)
   Pt is returning call, she said she spoke with [facility] at [address] and the meds will cost $1,847.00

## 2020-11-16 NOTE — Telephone Encounter (Signed)
Called and spoke w/pt and was able to explain that the [employer] froundation grant will not renew so we will mail patient assistance forms. I asked the pt if she had any meds at home she didn't know what the new cost would be so I advised her to check w/pharmacy and then reach out if its unaffordable pt voiced understanding

## 2020-11-16 NOTE — Addendum Note (Signed)
Addended by: Eather Colas on: 11/16/2020 04:42 PM   Modules accepted: Orders

## 2020-11-16 NOTE — Telephone Encounter (Signed)
Patient calling the office for samples of medication:   1.  What medication and dosage are you requesting samples for? Evolocumab (REPATHA SURECLICK) 140 MG/ML SOAJ  2.  Are you currently out of this medication? Yes  Patient is due to give herself a shot tomorrow

## 2020-11-16 NOTE — Telephone Encounter (Signed)
Called and spoke to the pharmacy and they stated that they can fill next month. Also returned a call to the pt and offered 1 sample of the repatha sureclick which I will place in the fridge

## 2020-11-16 NOTE — Telephone Encounter (Signed)
[name], please call pt to discuss. She has active rx for Repatha on file at her pharmacy. Does look like she was in the [employer] but it expired on 10/03/20. Would encourage her to refill Repatha at her pharmacy as we cannot provide  her samples for 1/3 of the year. If copay is cost prohibitive, can try filling out Safety Net Application or add her to our list of patients to call whenever the grant reopens.

## 2020-11-17 ENCOUNTER — Telehealth: Payer: Self-pay | Admitting: Cardiovascular Disease

## 2020-11-17 NOTE — Telephone Encounter (Signed)
Pt c/o medication issue:  1. Name of Medication:  Evolocumab (REPATHA SURECLICK) 140 MG/ML SOAJ  2. How are you currently taking this medication (dosage and times per day)?   3. Are you having a reaction (difficulty breathing--STAT)?   4. What is your medication issue?   Patient states Francine [employer]ven is needing a PA to cover this medication.

## 2020-11-17 NOTE — Telephone Encounter (Signed)
[name] (Key: [id number]) Repatha SureClick 140MG /ML auto-injectors   Form Electronic PA Form Created 1 minute ago Sent to Plan less than a minute ago Plan Response less than a minute ago Submit Clinical Questions Determination Message from Plan Authorization already on file for this request.Authorization starting on 03/26/2020 and ending on 03/25/2021.

## 2020-11-21 NOTE — Telephone Encounter (Signed)
Patient was returning call 

## 2020-11-22 NOTE — Telephone Encounter (Signed)
Pt called and stated that they cannont get their cpa to answer and grant them their most recent tax return for the pt assistance form so they stated that they could bring what they have and if the program needs more information they can call them I replied that they are more to do that

## 2020-11-23 DIAGNOSIS — E039 Hypothyroidism, unspecified: Secondary | ICD-10-CM | POA: Diagnosis not present

## 2020-11-23 DIAGNOSIS — E1122 Type 2 diabetes mellitus with diabetic chronic kidney disease: Secondary | ICD-10-CM | POA: Diagnosis not present

## 2020-11-23 DIAGNOSIS — I10 Essential (primary) hypertension: Secondary | ICD-10-CM | POA: Diagnosis not present

## 2020-11-23 DIAGNOSIS — E7800 Pure hypercholesterolemia, unspecified: Secondary | ICD-10-CM | POA: Diagnosis not present

## 2020-11-25 ENCOUNTER — Ambulatory Visit (INDEPENDENT_AMBULATORY_CARE_PROVIDER_SITE_OTHER): Payer: Medicare PPO

## 2020-11-25 ENCOUNTER — Telehealth: Payer: Medicare PPO

## 2020-11-25 DIAGNOSIS — E039 Hypothyroidism, unspecified: Secondary | ICD-10-CM

## 2020-11-25 DIAGNOSIS — E1122 Type 2 diabetes mellitus with diabetic chronic kidney disease: Secondary | ICD-10-CM

## 2020-11-25 DIAGNOSIS — E7800 Pure hypercholesterolemia, unspecified: Secondary | ICD-10-CM

## 2020-11-25 DIAGNOSIS — I2510 Atherosclerotic heart disease of native coronary artery without angina pectoris: Secondary | ICD-10-CM

## 2020-11-25 DIAGNOSIS — N182 Chronic kidney disease, stage 2 (mild): Secondary | ICD-10-CM

## 2020-11-25 DIAGNOSIS — I10 Essential (primary) hypertension: Secondary | ICD-10-CM

## 2020-11-25 MED ORDER — REPATHA SURECLICK 140 MG/ML ~~LOC~~ SOAJ: 140.0000 mg | SUBCUTANEOUS | 0 refills | Status: DC

## 2020-11-25 NOTE — Addendum Note (Signed)
Addended by: Eather Colas on: 11/25/2020 11:03 AM   Modules accepted: Orders

## 2020-11-25 NOTE — Telephone Encounter (Signed)
Pt called in and reported that they needed a sample so I granted them one for repatha while we process pt assistance paperwork

## 2020-11-29 ENCOUNTER — Telehealth: Payer: Medicare PPO

## 2020-11-29 NOTE — Chronic Care Management (AMB) (Signed)
Chronic Care Management   CCM RN Visit Note  11/16/2020 Name: [name] MRN: [id number] DOB: [birth year]  Subjective: [name] is a 75 y.o. year old female who is a primary care patient of [name], MD. The care management team was consulted for assistance with disease management and care coordination needs.    Engaged with patient by telephone for follow up visit in response to provider referral for case management and/or care coordination services.   Consent to Services:  The patient was given information about Chronic Care Management services, agreed to services, and gave verbal consent prior to initiation of services.  Please see initial visit note for detailed documentation.   Patient agreed to services and verbal consent obtained.   Assessment: Review of patient past medical history, allergies, medications, health status, including review of consultants reports, laboratory and other test data, was performed as part of comprehensive evaluation and provision of chronic care management services.   SDOH (Social Determinants of Health) assessments and interventions performed:    CCM Care Plan  Allergies  Allergen Reactions   Atorvastatin Swelling and Other (See Comments)    Limbs swell   Clopidogrel Other (See Comments)    Caused bruising   Metoprolol Other (See Comments)    Slows heart rate too low   Metoprolol Tartrate Swelling and Other (See Comments)    Site of swelling not noted   Pregabalin Swelling and Other (See Comments)    Site of swelling not noted   Rosuvastatin Calcium Other (See Comments)    Unknown reaction    Outpatient Encounter Medications as of 11/16/2020  Medication Sig Note   albuterol (PROVENTIL HFA;VENTOLIN HFA) 108 (90 Base) MCG/ACT inhaler Inhale 2 puffs into the lungs every 6 (six) hours as needed for wheezing or shortness of breath.    amLODipine (NORVASC) 5 MG tablet Take 5 mg by mouth at bedtime. 1/2 tab (Dr. Algie Coffer)     aspirin EC 81 MG tablet Take 81 mg by mouth daily. 01/31/2020: Patient would not disclose   b complex vitamins tablet Take 1 tablet by mouth daily with lunch.  01/31/2020: Patient would not disclose   Calcium Carb-Cholecalciferol (CALCIUM + D3 PO) Take 1 tablet by mouth at bedtime. 01/31/2020: Patient would not disclose   cholecalciferol (VITAMIN D) 25 MCG (1000 UNIT) tablet Take 1 tablet by mouth daily.  01/31/2020: Patient would not disclose   cilostazol (PLETAL) 100 MG tablet TAKE 1 TABLET EVERY DAY    furosemide (LASIX) 20 MG tablet TAKE 1 TABLET (20 MG TOTAL) BY MOUTH DAILY.    hydrALAZINE (APRESOLINE) 10 MG tablet Take 1 tablet (10 mg total) by mouth every 8 (eight) hours. (Patient taking differently: Take 10 mg by mouth 3 (three) times daily with meals.) 08/09/2020: Per last visit with Dr. Algie Coffer take two tablets twice per day.    isosorbide mononitrate (IMDUR) 60 MG 24 hr tablet TAKE 1 TABLET EVERY DAY    levothyroxine (SYNTHROID) 88 MCG tablet Take 1 tablet (88 mcg total) by mouth daily before breakfast. 1 tab daily    Magnesium 250 MG TABS Take 1 tablet (250 mg total) by mouth at bedtime.    Multiple Vitamins-Minerals (MULTIVITAMIN WITH MINERALS) tablet Take 1 tablet by mouth daily with lunch.     nitroGLYCERIN (NITROSTAT) 0.4 MG SL tablet PLACE 1 TABLET UNDER THE TONGUE EVERY 5 MINUTES X 3 DOSES AS NEEDED FOR CHEST PAIN.    olmesartan (BENICAR) 40 MG tablet TAKE 1 TABLET EVERY DAY  08/09/2020: Patient reports taking Olmesartan 20 mg tablet daily per Dr. Algie Coffer.    potassium chloride (K-DUR) 10 MEQ tablet Take 10 mEq by mouth 2 (two) times daily with breakfast and lunch.     Semaglutide (OZEMPIC, 0.25 OR 0.5 MG/DOSE, [place]) Inject 0.25 mg into the skin every Thursday. Every other week (Dr. Algie Coffer) 08/09/2020: Taking once a week on Friday.    [DISCONTINUED] Evolocumab (REPATHA SURECLICK) 140 MG/ML SOAJ Inject 140 mg into the skin every 14 (fourteen) days.    No facility-administered encounter  medications on file as of 11/16/2020.    Patient Active Problem List   Diagnosis Date Noted   Hyperlipidemia 10/30/2019   Statin myopathy 10/30/2019   Acute bronchitis 06/06/2018   Type 2 diabetes mellitus with stage 2 chronic kidney disease, without long-term current use of insulin (HCC) 02/17/2018   Chronic renal disease, stage II 02/17/2018   Benign hypertensive heart and renal disease 02/17/2018   Primary hypothyroidism 02/17/2018   Localized osteoarthritis of right knee 02/17/2018   Essential hypertension 06/12/2017   Acute coronary syndrome (HCC) 12/26/2016   Hypertensive heart disease without heart failure 10/06/2015   Insomnia with sleep apnea 10/06/2015   OSA on CPAP 10/06/2015   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 10/06/2015   CAD in native artery 10/06/2015   Chest pain at rest 07/18/2015    Class: Acute   Weakness 07/18/2015    Class: Acute   Insomnia 05/05/2015   Primary snoring 05/05/2015    Conditions to be addressed/monitored: Tpe 2 DM with stage II CKD, Hypothyroidism, Hypercholesterolemia, HTN, CAD  Care Plan : Wellness (Adult)  Updates made by Riley Churches, RN since 11/16/2020 12:00 AM     Problem: Medication Adherence (Wellness)   Priority: High     Goal: Medication Adherence Maintained   Start Date: 11/14/2020  Expected End Date: 02/14/2021  Recent Progress: On track  Priority: High  Note:   Current Barriers:  Ineffective Self Health Maintenance in a patient with Tpe 2 DM with stage II CKD, Hypothyroidism, Hypercholesterolemia, HTN, CAD Clinical Goal(s):  Collaboration with [name], MD regarding development and update of comprehensive plan of care as evidenced by provider attestation and co-signature Inter-disciplinary care team collaboration (see longitudinal plan of care) patient will work with care management team to address care coordination and chronic disease management needs related to Disease Management Educational Needs Care  Coordination Medication Management and Education Psychosocial Support   Interventions:  11/16/20 inbound call from patient  Evaluation of current treatment plan related to Tpe 2 DM with stage II CKD, Hypothyroidism, Hypercholesterolemia, HTN, CAD, self-management and patient's adherence to plan as established by provider. Collaboration with [name], MD regarding development and update of comprehensive plan of care as evidenced by provider attestation       and co-signature Inter-disciplinary care team collaboration (see longitudinal plan of care) Discussed patient has been unable to obtain her refills for Repatha Reviewed chart and noted last Rx for Repatha was sent by PCP to [facility] with 3 refills sent on 06/30/20 Completed joint call with patient and [facility] pharmacy team member to learn this Rx was transferred to [facility] on [address] in June 2022 Determined patient prefers her prescriptions be sent to this [facility] and will plan to use this pharmacy as her primary pharmacy moving forward Collaborated with PCP requesting a refill be sent to her preferred pharmacy, advised patient's next injection is due this Thursday, PCP replied with okay to send Rx per CMA with  6 refills, patient advised  Discussed patient will call Cardiology for samples  Discussed plans with patient for ongoing care management follow up and provided patient with direct contact information for care management team Self Care Activities:  Self administers medications as prescribed Attends all scheduled provider appointments Calls pharmacy for medication refills Calls provider office for new concerns or questions Patient Goals: - maintain medication adherence   Follow Up Plan: Telephone follow up appointment with care management team member scheduled for: 02/10/21     Plan:Telephone follow up appointment with care management team member scheduled for:  02/10/21  Delsa Sale, RN, BSN,  CCM Care Management Coordinator [facility] Care Management/[facility]  Direct Phone: [phone]

## 2020-11-29 NOTE — Patient Instructions (Signed)
Goals Addressed      Medication Adherence Maintained   On track    Timeframe:  Short-Term Goal Priority:  High Start Date:  11/14/20                           Expected End Date:  02/14/21    Next Scheduled follow up: 02/10/21  Self Care Activities:  Self administers medications as prescribed Attends all scheduled provider appointments Calls pharmacy for medication refills Calls provider office for new concerns or questions Patient Goals: - maintain medication adherence                            

## 2020-11-30 NOTE — Patient Instructions (Signed)
Goals Addressed      Medication Adherence Maintained   On track    Timeframe:  Short-Term Goal Priority:  High Start Date:  11/14/20                           Expected End Date:  02/14/21    Next Scheduled follow up: 02/10/21  Self Care Activities:  Self administers medications as prescribed Attends all scheduled provider appointments Calls pharmacy for medication refills Calls provider office for new concerns or questions Patient Goals: - maintain medication adherence

## 2020-11-30 NOTE — Chronic Care Management (AMB) (Signed)
Chronic Care Management   CCM RN Visit Note  11/25/2020 Name: [name] MRN: [id number] DOB: 01-26-1946  Subjective: [name] is a 75 y.o. year old female who is a primary care patient of [name], MD. The care management team was consulted for assistance with disease management and care coordination needs.    Engaged with patient by telephone for follow up visit in response to provider referral for case management and/or care coordination services.   Consent to Services:  The patient was given information about Chronic Care Management services, agreed to services, and gave verbal consent prior to initiation of services.  Please see initial visit note for detailed documentation.   Patient agreed to services and verbal consent obtained.   Assessment: Review of patient past medical history, allergies, medications, health status, including review of consultants reports, laboratory and other test data, was performed as part of comprehensive evaluation and provision of chronic care management services.   SDOH (Social Determinants of Health) assessments and interventions performed:    CCM Care Plan  Allergies  Allergen Reactions   Atorvastatin Swelling and Other (See Comments)    Limbs swell   Clopidogrel Other (See Comments)    Caused bruising   Metoprolol Other (See Comments)    Slows heart rate too low   Metoprolol Tartrate Swelling and Other (See Comments)    Site of swelling not noted   Pregabalin Swelling and Other (See Comments)    Site of swelling not noted   Rosuvastatin Calcium Other (See Comments)    Unknown reaction    Outpatient Encounter Medications as of 11/25/2020  Medication Sig Note   albuterol (PROVENTIL HFA;VENTOLIN HFA) 108 (90 Base) MCG/ACT inhaler Inhale 2 puffs into the lungs every 6 (six) hours as needed for wheezing or shortness of breath.    amLODipine (NORVASC) 5 MG tablet Take 5 mg by mouth at bedtime. 1/2 tab (Dr. Algie Coffer)     aspirin EC 81 MG tablet Take 81 mg by mouth daily. 01/31/2020: Patient would not disclose   b complex vitamins tablet Take 1 tablet by mouth daily with lunch.  01/31/2020: Patient would not disclose   Calcium Carb-Cholecalciferol (CALCIUM + D3 PO) Take 1 tablet by mouth at bedtime. 01/31/2020: Patient would not disclose   cholecalciferol (VITAMIN D) 25 MCG (1000 UNIT) tablet Take 1 tablet by mouth daily.  01/31/2020: Patient would not disclose   cilostazol (PLETAL) 100 MG tablet TAKE 1 TABLET EVERY DAY    Evolocumab (REPATHA SURECLICK) 140 MG/ML SOAJ Inject 140 mg into the skin every 14 (fourteen) days.    furosemide (LASIX) 20 MG tablet TAKE 1 TABLET (20 MG TOTAL) BY MOUTH DAILY.    hydrALAZINE (APRESOLINE) 10 MG tablet Take 1 tablet (10 mg total) by mouth every 8 (eight) hours. (Patient taking differently: Take 10 mg by mouth 3 (three) times daily with meals.) 08/09/2020: Per last visit with Dr. Algie Coffer take two tablets twice per day.    isosorbide mononitrate (IMDUR) 60 MG 24 hr tablet TAKE 1 TABLET EVERY DAY    levothyroxine (SYNTHROID) 88 MCG tablet Take 1 tablet (88 mcg total) by mouth daily before breakfast. 1 tab daily    Magnesium 250 MG TABS Take 1 tablet (250 mg total) by mouth at bedtime.    Multiple Vitamins-Minerals (MULTIVITAMIN WITH MINERALS) tablet Take 1 tablet by mouth daily with lunch.     nitroGLYCERIN (NITROSTAT) 0.4 MG SL tablet PLACE 1 TABLET UNDER THE TONGUE EVERY 5 MINUTES X 3  DOSES AS NEEDED FOR CHEST PAIN.    olmesartan (BENICAR) 40 MG tablet TAKE 1 TABLET EVERY DAY 08/09/2020: Patient reports taking Olmesartan 20 mg tablet daily per Dr. Algie Coffer.    potassium chloride (K-DUR) 10 MEQ tablet Take 10 mEq by mouth 2 (two) times daily with breakfast and lunch.     Semaglutide (OZEMPIC, 0.25 OR 0.5 MG/DOSE, [place]) Inject 0.25 mg into the skin every Thursday. Every other week (Dr. Algie Coffer) 08/09/2020: Taking once a week on Friday.    No facility-administered encounter medications on file  as of 11/25/2020.    Patient Active Problem List   Diagnosis Date Noted   Hyperlipidemia 10/30/2019   Statin myopathy 10/30/2019   Acute bronchitis 06/06/2018   Type 2 diabetes mellitus with stage 2 chronic kidney disease, without long-term current use of insulin (HCC) 02/17/2018   Chronic renal disease, stage II 02/17/2018   Benign hypertensive heart and renal disease 02/17/2018   Primary hypothyroidism 02/17/2018   Localized osteoarthritis of right knee 02/17/2018   Essential hypertension 06/12/2017   Acute coronary syndrome (HCC) 12/26/2016   Hypertensive heart disease without heart failure 10/06/2015   Insomnia with sleep apnea 10/06/2015   OSA on CPAP 10/06/2015   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 10/06/2015   CAD in native artery 10/06/2015   Chest pain at rest 07/18/2015    Class: Acute   Weakness 07/18/2015    Class: Acute   Insomnia 05/05/2015   Primary snoring 05/05/2015    Conditions to be addressed/monitored: Tpe 2 DM with stage II CKD, Hypothyroidism, Hypercholesterolemia, HTN, CAD  Care Plan : Wellness (Adult)  Updates made by Riley Churches, RN since 11/25/2020 12:00 AM     Problem: Medication Adherence (Wellness)   Priority: High     Goal: Medication Adherence Maintained   Start Date: 11/14/2020  Expected End Date: 02/14/2021  Recent Progress: On track  Priority: High  Note:   Current Barriers:  Ineffective Self Health Maintenance in a patient with Tpe 2 DM with stage II CKD, Hypothyroidism, Hypercholesterolemia, HTN, CAD Clinical Goal(s):  Collaboration with [name], MD regarding development and update of comprehensive plan of care as evidenced by provider attestation and co-signature Inter-disciplinary care team collaboration (see longitudinal plan of care) patient will work with care management team to address care coordination and chronic disease management needs related to Disease Management Educational Needs Care Coordination Medication  Management and Education Psychosocial Support   Interventions:  11/16/20 inbound call from patient  Evaluation of current treatment plan related to Tpe 2 DM with stage II CKD, Hypothyroidism, Hypercholesterolemia, HTN, CAD, self-management and patient's adherence to plan as established by provider. Collaboration with [name], MD regarding development and update of comprehensive plan of care as evidenced by provider attestation       and co-signature Inter-disciplinary care team collaboration (see longitudinal plan of care) Discussed patient has been unable to obtain her refills for Repatha Reviewed chart and noted last Rx for Repatha was sent by PCP to [facility] with 3 refills sent on 06/30/20 Completed joint call with patient and [facility] pharmacy team member to learn this Rx was transferred to [facility] on [address] in June 2022 Determined patient prefers her prescriptions be sent to this [facility] and will plan to use this pharmacy as her primary pharmacy moving forward Collaborated with PCP requesting a refill be sent to her preferred pharmacy, advised patient's next injection is due this Thursday, PCP replied with okay to send Rx per CMA with 6  refills, patient advised  Discussed patient will call Cardiology for samples  Discussed plans with patient for ongoing care management follow up and provided patient with direct contact information for care management team 11/25/20 Inbound call from patient  Determined patient has been unable to refill her Rapatha, she has one sample remaining  Completed joint call with patients preferred pharmacy [facility], spoke with pharmacist to confirm patient's Rapatha has been authorized and is able to be filled on 11/30/20  Instructed patient to contact the pharmacy on 11/30/20 to request her Rapatha refill, she verbalizes understanding  Sent in basket message to embedded Pharm D [name] with an update regarding patient's  inbound call and concern about her Rapatha refill Discussed plans with patient for ongoing care management follow up and provided patient with direct contact information for care management team Self Care Activities:  Self administers medications as prescribed Attends all scheduled provider appointments Calls pharmacy for medication refills Calls provider office for new concerns or questions Patient Goals: - maintain medication adherence   Follow Up Plan: Telephone follow up appointment with care management team member scheduled for: 02/10/21    Plan:Telephone follow up appointment with care management team member scheduled for:  02/10/21  Delsa Sale, RN, BSN, CCM Care Management Coordinator [facility] Care Management/[facility]  Direct Phone: [phone]

## 2020-12-01 DIAGNOSIS — E119 Type 2 diabetes mellitus without complications: Secondary | ICD-10-CM | POA: Diagnosis not present

## 2020-12-01 DIAGNOSIS — E1165 Type 2 diabetes mellitus with hyperglycemia: Secondary | ICD-10-CM | POA: Diagnosis not present

## 2020-12-01 DIAGNOSIS — Z794 Long term (current) use of insulin: Secondary | ICD-10-CM | POA: Diagnosis not present

## 2020-12-05 NOTE — Telephone Encounter (Signed)
Called and spoke w/pt to let them know that the [employer]fen snf foundation approved them to get repatha free for the rest of the year

## 2020-12-07 ENCOUNTER — Telehealth: Payer: Self-pay

## 2020-12-07 NOTE — Chronic Care Management (AMB) (Signed)
  [name] was reminded to have all medications, supplements and any blood glucose and blood pressure readings available for review with [name], Pharm. D, at her telephone visit on 12-08-2020 at 3:45.   Questions: Have you had any recent office visit or specialist visit outside of [facility]? Patient states no  Are there any concerns you would like to discuss during your office visit? Patient states no  Are you having any problems obtaining your medications? (Whether it pharmacy issues or cost) Patient states no  If patient has any PAP medications ask if they are having any problems getting their PAP medication or refill? Patient stated no  Care Gaps: Medicare wellness 05-31-2021 Shingrix overdue Yearly Ophthalmology overdue Covid booster overdue  Star Rating Drug: Ozempic 0.25 mg- Patient Assistance Olmesartan 40 mg- Last filled 06-18-2020 90 DS [employer] (Patient breaks pills in half to make 20 mg)  Any gaps in medications fill history?  Huey Romans [facility] Clinical Pharmacist Assistant [phone]

## 2020-12-08 ENCOUNTER — Telehealth: Payer: Self-pay

## 2020-12-08 NOTE — Telephone Encounter (Signed)
  Care Management   Follow Up Note   12/08/2020 Name: [name] MRN: [id number] DOB: 03-Sep-1945   Referred by: [name], MD Reason for referral : Chronic Care Management (Inbound call from patient)  Voice message received from patient requesting a return call regarding her Rapatha refill. The patient was referred to the case management team for assistance with care management and care coordination.   Follow Up Plan: Telephone follow up appointment with care management team member scheduled for: 12/09/20  Delsa Sale, RN, BSN, CCM Care Management Coordinator [facility] Care Management/[facility]  Direct Phone: [phone]

## 2020-12-08 NOTE — Progress Notes (Signed)
Chronic Care Management Pharmacy Assistant   Name: [name]  MRN: [id number] DOB: 08/26/1945   Reason for Encounter: Disease State/ Diabetes  Recent office visits:  11-02-2020 [name] (CCM)  11-14-2020 Little, [name], RN (CCM)  11-16-2020 Little, [name], RN (CCM)  11-25-2020 Little, [name], RN (CCM)  Recent consult visits:  None  Hospital visits:  None in previous 6 months  Medications: Outpatient Encounter Medications as of 12/08/2020  Medication Sig Note   albuterol (PROVENTIL HFA;VENTOLIN HFA) 108 (90 Base) MCG/ACT inhaler Inhale 2 puffs into the lungs every 6 (six) hours as needed for wheezing or shortness of breath.    amLODipine (NORVASC) 5 MG tablet Take 5 mg by mouth at bedtime. 1/2 tab (Dr. Algie Coffer)    aspirin EC 81 MG tablet Take 81 mg by mouth daily. 01/31/2020: Patient would not disclose   b complex vitamins tablet Take 1 tablet by mouth daily with lunch.  01/31/2020: Patient would not disclose   Calcium Carb-Cholecalciferol (CALCIUM + D3 PO) Take 1 tablet by mouth at bedtime. 01/31/2020: Patient would not disclose   cholecalciferol (VITAMIN D) 25 MCG (1000 UNIT) tablet Take 1 tablet by mouth daily.  01/31/2020: Patient would not disclose   cilostazol (PLETAL) 100 MG tablet TAKE 1 TABLET EVERY DAY    Evolocumab (REPATHA SURECLICK) 140 MG/ML SOAJ Inject 140 mg into the skin every 14 (fourteen) days.    furosemide (LASIX) 20 MG tablet TAKE 1 TABLET (20 MG TOTAL) BY MOUTH DAILY.    hydrALAZINE (APRESOLINE) 10 MG tablet Take 1 tablet (10 mg total) by mouth every 8 (eight) hours. (Patient taking differently: Take 10 mg by mouth 3 (three) times daily with meals.) 08/09/2020: Per last visit with Dr. Algie Coffer take two tablets twice per day.    isosorbide mononitrate (IMDUR) 60 MG 24 hr tablet TAKE 1 TABLET EVERY DAY    levothyroxine (SYNTHROID) 88 MCG tablet Take 1 tablet (88 mcg total) by mouth daily before breakfast. 1 tab daily    Magnesium 250 MG TABS  Take 1 tablet (250 mg total) by mouth at bedtime.    Multiple Vitamins-Minerals (MULTIVITAMIN WITH MINERALS) tablet Take 1 tablet by mouth daily with lunch.     nitroGLYCERIN (NITROSTAT) 0.4 MG SL tablet PLACE 1 TABLET UNDER THE TONGUE EVERY 5 MINUTES X 3 DOSES AS NEEDED FOR CHEST PAIN.    olmesartan (BENICAR) 40 MG tablet TAKE 1 TABLET EVERY DAY 08/09/2020: Patient reports taking Olmesartan 20 mg tablet daily per Dr. Algie Coffer.    potassium chloride (K-DUR) 10 MEQ tablet Take 10 mEq by mouth 2 (two) times daily with breakfast and lunch.     Semaglutide (OZEMPIC, 0.25 OR 0.5 MG/DOSE, [place]) Inject 0.25 mg into the skin every Thursday. Every other week (Dr. Algie Coffer) 08/09/2020: Taking once a week on Friday.    No facility-administered encounter medications on file as of 12/08/2020.  Recent Relevant Labs: Lab Results  Component Value Date/Time   HGBA1C 6.2 (H) 10/03/2020 12:18 PM   HGBA1C 6.0 (H) 05/26/2020 11:51 AM   MICROALBUR 10 05/26/2020 02:40 PM   MICROALBUR 10 05/27/2019 11:30 AM    Kidney Function Lab Results  Component Value Date/Time   CREATININE 1.17 (H) 10/03/2020 12:18 PM   CREATININE 1.06 (H) 05/26/2020 11:51 AM   GFRNONAA 52 (L) 01/31/2020 10:44 AM   GFRAA 55 (L) 09/30/2019 10:48 AM    Current antihyperglycemic regimen:  Ozempic 0.25 mg weekly  What recent interventions/DTPs have been made to improve  glycemic control:  Educated on Daily salt intake goal < 2300 mg Exercise goal of 150 minutes per week Symptoms of hypotension and importance of maintaining adequate hydration  Have there been any recent hospitalizations or ED visits since last visit with CPP? No  Patient denies hypoglycemic symptoms  Patient denies hyperglycemic symptoms  How often are you checking your blood sugar? once daily  What are your blood sugars ranging?  Fasting: 132, 98, 97, 118 Before meals: None After meals: None Bedtime: None  During the week, how often does your blood glucose drop below  70? Never  Are you checking your feet daily/regularly? Patient states regularly  Adherence Review: Is the patient currently on a STATIN medication? Yes Is the patient currently on ACE/ARB medication? Yes Does the patient have >5 day gap between last estimated fill dates? No  NOTE: Rescheduled 12-08-2020 appointment to December. Patient will be out of towm most of October and has a office visit with Dr. Allyne Gee in November.  Care Gaps: Medicare wellness 05-31-2021 Shingrix overdue Yearly Ophthalmology overdue Covid booster overdue  Star Rating Drugs: Ozempic 0.25 mg- Patient Assistance Olmesartan 40 mg- Last filled 06-18-2020 90 DS [employer] (Patient breaks pills in half to make 20 mg)  Huey Romans [facility] Clinical Pharmacist Assistant [phone]

## 2020-12-09 ENCOUNTER — Telehealth: Payer: Medicare PPO

## 2020-12-09 ENCOUNTER — Ambulatory Visit: Payer: Self-pay

## 2020-12-09 DIAGNOSIS — E7800 Pure hypercholesterolemia, unspecified: Secondary | ICD-10-CM

## 2020-12-09 DIAGNOSIS — E039 Hypothyroidism, unspecified: Secondary | ICD-10-CM

## 2020-12-09 DIAGNOSIS — I10 Essential (primary) hypertension: Secondary | ICD-10-CM

## 2020-12-09 DIAGNOSIS — I2510 Atherosclerotic heart disease of native coronary artery without angina pectoris: Secondary | ICD-10-CM

## 2020-12-09 DIAGNOSIS — E1122 Type 2 diabetes mellitus with diabetic chronic kidney disease: Secondary | ICD-10-CM

## 2020-12-09 DIAGNOSIS — N182 Chronic kidney disease, stage 2 (mild): Secondary | ICD-10-CM

## 2020-12-13 NOTE — Chronic Care Management (AMB) (Signed)
Chronic Care Management   CCM RN Visit Note  12/09/2020 Name: [name] MRN: [id number] DOB: [birth year]  Subjective: [name] is a 75 y.o. year old female who is a primary care patient of [name], MD. The care management team was consulted for assistance with disease management and care coordination needs.    Engaged with patient by telephone for follow up visit in response to provider referral for case management and/or care coordination services.   Consent to Services:  The patient was given information about Chronic Care Management services, agreed to services, and gave verbal consent prior to initiation of services.  Please see initial visit note for detailed documentation.   Patient agreed to services and verbal consent obtained.   Assessment: Review of patient past medical history, allergies, medications, health status, including review of consultants reports, laboratory and other test data, was performed as part of comprehensive evaluation and provision of chronic care management services.   SDOH (Social Determinants of Health) assessments and interventions performed: yes, no acute challenges    CCM Care Plan  Allergies  Allergen Reactions   Atorvastatin Swelling and Other (See Comments)    Limbs swell   Clopidogrel Other (See Comments)    Caused bruising   Metoprolol Other (See Comments)    Slows heart rate too low   Metoprolol Tartrate Swelling and Other (See Comments)    Site of swelling not noted   Pregabalin Swelling and Other (See Comments)    Site of swelling not noted   Rosuvastatin Calcium Other (See Comments)    Unknown reaction    Outpatient Encounter Medications as of 12/09/2020  Medication Sig Note   albuterol (PROVENTIL HFA;VENTOLIN HFA) 108 (90 Base) MCG/ACT inhaler Inhale 2 puffs into the lungs every 6 (six) hours as needed for wheezing or shortness of breath.    amLODipine (NORVASC) 5 MG tablet Take 5 mg by mouth at bedtime.  1/2 tab (Dr. Algie Coffer)    aspirin EC 81 MG tablet Take 81 mg by mouth daily. 01/31/2020: Patient would not disclose   b complex vitamins tablet Take 1 tablet by mouth daily with lunch.  01/31/2020: Patient would not disclose   Calcium Carb-Cholecalciferol (CALCIUM + D3 PO) Take 1 tablet by mouth at bedtime. 01/31/2020: Patient would not disclose   cholecalciferol (VITAMIN D) 25 MCG (1000 UNIT) tablet Take 1 tablet by mouth daily.  01/31/2020: Patient would not disclose   cilostazol (PLETAL) 100 MG tablet TAKE 1 TABLET EVERY DAY    Evolocumab (REPATHA SURECLICK) 140 MG/ML SOAJ Inject 140 mg into the skin every 14 (fourteen) days.    furosemide (LASIX) 20 MG tablet TAKE 1 TABLET (20 MG TOTAL) BY MOUTH DAILY.    hydrALAZINE (APRESOLINE) 10 MG tablet Take 1 tablet (10 mg total) by mouth every 8 (eight) hours. (Patient taking differently: Take 10 mg by mouth 3 (three) times daily with meals.) 08/09/2020: Per last visit with Dr. Algie Coffer take two tablets twice per day.    isosorbide mononitrate (IMDUR) 60 MG 24 hr tablet TAKE 1 TABLET EVERY DAY    levothyroxine (SYNTHROID) 88 MCG tablet Take 1 tablet (88 mcg total) by mouth daily before breakfast. 1 tab daily    Magnesium 250 MG TABS Take 1 tablet (250 mg total) by mouth at bedtime.    Multiple Vitamins-Minerals (MULTIVITAMIN WITH MINERALS) tablet Take 1 tablet by mouth daily with lunch.     nitroGLYCERIN (NITROSTAT) 0.4 MG SL tablet PLACE 1 TABLET UNDER THE TONGUE EVERY  5 MINUTES X 3 DOSES AS NEEDED FOR CHEST PAIN.    olmesartan (BENICAR) 40 MG tablet TAKE 1 TABLET EVERY DAY 08/09/2020: Patient reports taking Olmesartan 20 mg tablet daily per Dr. Algie Coffer.    potassium chloride (K-DUR) 10 MEQ tablet Take 10 mEq by mouth 2 (two) times daily with breakfast and lunch.     Semaglutide (OZEMPIC, 0.25 OR 0.5 MG/DOSE, [place]) Inject 0.25 mg into the skin every Thursday. Every other week (Dr. Algie Coffer) 08/09/2020: Taking once a week on Friday.    No facility-administered  encounter medications on file as of 12/09/2020.    Patient Active Problem List   Diagnosis Date Noted   Hyperlipidemia 10/30/2019   Statin myopathy 10/30/2019   Acute bronchitis 06/06/2018   Type 2 diabetes mellitus with stage 2 chronic kidney disease, without long-term current use of insulin (HCC) 02/17/2018   Chronic renal disease, stage II 02/17/2018   Benign hypertensive heart and renal disease 02/17/2018   Primary hypothyroidism 02/17/2018   Localized osteoarthritis of right knee 02/17/2018   Essential hypertension 06/12/2017   Acute coronary syndrome (HCC) 12/26/2016   Hypertensive heart disease without heart failure 10/06/2015   Insomnia with sleep apnea 10/06/2015   OSA on CPAP 10/06/2015   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 10/06/2015   CAD in native artery 10/06/2015   Chest pain at rest 07/18/2015    Class: Acute   Weakness 07/18/2015    Class: Acute   Insomnia 05/05/2015   Primary snoring 05/05/2015    Conditions to be addressed/monitored: Tpe 2 DM with stage II CKD, Hypothyroidism, Hypercholesterolemia, HTN, CAD  Care Plan : Wellness (Adult)  Updates made by Riley Churches, RN since 12/09/2020 12:00 AM     Problem: Medication Adherence (Wellness)   Priority: High     Goal: Medication Adherence Maintained   Start Date: 11/14/2020  Expected End Date: 02/14/2021  Recent Progress: On track  Priority: High  Note:   Current Barriers:  Ineffective Self Health Maintenance in a patient with Tpe 2 DM with stage II CKD, Hypothyroidism, Hypercholesterolemia, HTN, CAD Clinical Goal(s):  Collaboration with [name], MD regarding development and update of comprehensive plan of care as evidenced by provider attestation and co-signature Inter-disciplinary care team collaboration (see longitudinal plan of care) patient will work with care management team to address care coordination and chronic disease management needs related to Disease Management Educational Needs Care  Coordination Medication Management and Education Psychosocial Support   Interventions:  11/16/20 inbound call from patient  Evaluation of current treatment plan related to Tpe 2 DM with stage II CKD, Hypothyroidism, Hypercholesterolemia, HTN, CAD, self-management and patient's adherence to plan as established by provider. Collaboration with [name], MD regarding development and update of comprehensive plan of care as evidenced by provider attestation       and co-signature Inter-disciplinary care team collaboration (see longitudinal plan of care) Discussed patient has been unable to obtain her refills for Repatha Reviewed chart and noted last Rx for Repatha was sent by PCP to [facility] with 3 refills sent on 06/30/20 Completed joint call with patient and [facility] pharmacy team member to learn this Rx was transferred to [facility] on [address] in June 2022 Determined patient prefers her prescriptions be sent to this [facility] and will plan to use this pharmacy as her primary pharmacy moving forward Collaborated with PCP requesting a refill be sent to her preferred pharmacy, advised patient's next injection is due this Thursday, PCP replied with okay to send Rx  per CMA with 6 refills, patient advised  Discussed patient will call Cardiology for samples  Discussed plans with patient for ongoing care management follow up and provided patient with direct contact information for care management team 11/25/20 Inbound call from patient  Determined patient has been unable to refill her Rapatha, she has one sample remaining  Completed joint call with patients preferred pharmacy [facility], spoke with pharmacist to confirm patient's Rapatha has been authorized and is able to be filled on 11/30/20  Instructed patient to contact the pharmacy on 11/30/20 to request her Rapatha refill, she verbalizes understanding  Sent in basket message to embedded Pharm D [name] with an update  regarding patient's inbound call and concern about her Rapatha refill Discussed plans with patient for ongoing care management follow up and provided patient with direct contact information for care management team 12/09/20 completed inbound call with patient  Discussed patient has received patient care assistance from [employer] the makers of Repatha and will receive this medication for free Discussed she received a 90 day supply via [employer] on 12/08/20, allowing her to stay on tract with taking her Repatha as prescribed Answered patient questions regarding Repatha, educated on importance of adherence ongoing MD follow up Reviewed scheduled/upcoming provider appointments including: next PCP follow up visit scheduled for 02/08/21 @11 :15 AM; next RN CM telephone follow up scheduled for 02/10/21 @10 :30 AM  Discussed plans with patient for ongoing care management follow up and provided patient with direct contact information for care management team Self Care Activities:  Self administers medications as prescribed Attends all scheduled provider appointments Calls pharmacy for medication refills Calls provider office for new concerns or questions Patient Goals: - maintain medication adherence   Follow Up Plan: Telephone follow up appointment with care management team member scheduled for: 02/10/21     Plan:Telephone follow up appointment with care management team member scheduled for:  02/10/21  02/12/21, RN, BSN, CCM Care Management Coordinator [facility] Care Management/[facility]  Direct Phone: [phone]

## 2020-12-13 NOTE — Patient Instructions (Signed)
Visit Information  PATIENT GOALS:  Goals Addressed      Medication Adherence Maintained   On track    Timeframe:  Short-Term Goal Priority:  High Start Date:  11/14/20                           Expected End Date:  02/14/21    Next Scheduled follow up: 02/10/21  Self Care Activities:  Self administers medications as prescribed Attends all scheduled provider appointments Calls pharmacy for medication refills Calls provider office for new concerns or questions Patient Goals: - maintain medication adherence                            The patient verbalized understanding of instructions, educational materials, and care plan provided today and declined offer to receive copy of patient instructions, educational materials, and care plan.   Telephone follow up appointment with care management team member scheduled for: 02/10/21  Delsa Sale, RN, BSN, CCM Care Management Coordinator [facility] Care Management/[facility]  Direct Phone: [phone]

## 2020-12-15 ENCOUNTER — Telehealth: Payer: Self-pay

## 2020-12-15 NOTE — Progress Notes (Signed)
Patient was notified that Ozempic is ready to be picked up at the office. Patiemt stated she will be by today to get it.  Huey Romans [facility] Clinical Pharmacist Assistant [phone]

## 2020-12-20 DIAGNOSIS — R072 Precordial pain: Secondary | ICD-10-CM | POA: Diagnosis not present

## 2020-12-20 DIAGNOSIS — I2510 Atherosclerotic heart disease of native coronary artery without angina pectoris: Secondary | ICD-10-CM | POA: Diagnosis not present

## 2020-12-20 DIAGNOSIS — E119 Type 2 diabetes mellitus without complications: Secondary | ICD-10-CM | POA: Diagnosis not present

## 2020-12-23 DIAGNOSIS — N182 Chronic kidney disease, stage 2 (mild): Secondary | ICD-10-CM

## 2020-12-23 DIAGNOSIS — E1122 Type 2 diabetes mellitus with diabetic chronic kidney disease: Secondary | ICD-10-CM

## 2020-12-23 DIAGNOSIS — E7800 Pure hypercholesterolemia, unspecified: Secondary | ICD-10-CM | POA: Diagnosis not present

## 2020-12-23 DIAGNOSIS — I2510 Atherosclerotic heart disease of native coronary artery without angina pectoris: Secondary | ICD-10-CM

## 2020-12-23 DIAGNOSIS — E039 Hypothyroidism, unspecified: Secondary | ICD-10-CM

## 2020-12-23 DIAGNOSIS — I10 Essential (primary) hypertension: Secondary | ICD-10-CM

## 2020-12-27 ENCOUNTER — Other Ambulatory Visit: Payer: Self-pay | Admitting: Internal Medicine

## 2020-12-27 ENCOUNTER — Other Ambulatory Visit: Payer: Self-pay | Admitting: Cardiovascular Disease

## 2021-01-27 ENCOUNTER — Other Ambulatory Visit: Payer: Self-pay | Admitting: Internal Medicine

## 2021-01-27 DIAGNOSIS — Z1231 Encounter for screening mammogram for malignant neoplasm of breast: Secondary | ICD-10-CM

## 2021-01-31 ENCOUNTER — Telehealth: Payer: Self-pay

## 2021-01-31 NOTE — Chronic Care Management (AMB) (Signed)
    Chronic Care Management Pharmacy Assistant   Name: [name]  MRN: [id number] DOB: 03/03/1946   Reason for Encounter: 2023 Patient assistance re- enrollment   Medications: Outpatient Encounter Medications as of 01/31/2021  Medication Sig Note   albuterol (PROVENTIL HFA;VENTOLIN HFA) 108 (90 Base) MCG/ACT inhaler Inhale 2 puffs into the lungs every 6 (six) hours as needed for wheezing or shortness of breath.    amLODipine (NORVASC) 5 MG tablet Take 5 mg by mouth at bedtime. 1/2 tab (Dr. Algie Coffer)    aspirin EC 81 MG tablet Take 81 mg by mouth daily. 01/31/2020: Patient would not disclose   b complex vitamins tablet Take 1 tablet by mouth daily with lunch.  01/31/2020: Patient would not disclose   Calcium Carb-Cholecalciferol (CALCIUM + D3 PO) Take 1 tablet by mouth at bedtime. 01/31/2020: Patient would not disclose   cholecalciferol (VITAMIN D) 25 MCG (1000 UNIT) tablet Take 1 tablet by mouth daily.  01/31/2020: Patient would not disclose   cilostazol (PLETAL) 100 MG tablet TAKE 1 TABLET EVERY DAY    Evolocumab (REPATHA SURECLICK) 140 MG/ML SOAJ Inject 140 mg into the skin every 14 (fourteen) days.    furosemide (LASIX) 20 MG tablet TAKE 1 TABLET (20 MG TOTAL) BY MOUTH DAILY.    hydrALAZINE (APRESOLINE) 10 MG tablet Take 1 tablet (10 mg total) by mouth every 8 (eight) hours. (Patient taking differently: Take 10 mg by mouth 3 (three) times daily with meals.) 08/09/2020: Per last visit with Dr. Algie Coffer take two tablets twice per day.    isosorbide mononitrate (IMDUR) 60 MG 24 hr tablet TAKE 1 TABLET EVERY DAY    levothyroxine (SYNTHROID) 88 MCG tablet TAKE 1 TABLET EVERY DAY BEFORE BREAKFAST    Magnesium 250 MG TABS Take 1 tablet (250 mg total) by mouth at bedtime.    Multiple Vitamins-Minerals (MULTIVITAMIN WITH MINERALS) tablet Take 1 tablet by mouth daily with lunch.     nitroGLYCERIN (NITROSTAT) 0.4 MG SL tablet PLACE 1 TABLET UNDER THE TONGUE EVERY 5 MINUTES X 3 DOSES AS NEEDED  FOR CHEST PAIN.    olmesartan (BENICAR) 40 MG tablet TAKE 1 TABLET EVERY DAY 08/09/2020: Patient reports taking Olmesartan 20 mg tablet daily per Dr. Algie Coffer.    potassium chloride (K-DUR) 10 MEQ tablet Take 10 mEq by mouth 2 (two) times daily with breakfast and lunch.     Semaglutide (OZEMPIC, 0.25 OR 0.5 MG/DOSE, [place]) Inject 0.25 mg into the skin every Thursday. Every other week (Dr. Algie Coffer) 08/09/2020: Taking once a week on Friday.    No facility-administered encounter medications on file as of 01/31/2021.    01-31-2021: Called patient informing her that the 2023 patient assistance application will be mailed out today and to return completed portion with income.  Huey Romans [facility] Clinical Pharmacist Assistant [phone]

## 2021-02-01 DIAGNOSIS — E1165 Type 2 diabetes mellitus with hyperglycemia: Secondary | ICD-10-CM | POA: Diagnosis not present

## 2021-02-01 DIAGNOSIS — Z794 Long term (current) use of insulin: Secondary | ICD-10-CM | POA: Diagnosis not present

## 2021-02-01 DIAGNOSIS — E119 Type 2 diabetes mellitus without complications: Secondary | ICD-10-CM | POA: Diagnosis not present

## 2021-02-07 ENCOUNTER — Telehealth: Payer: Self-pay | Admitting: *Deleted

## 2021-02-07 NOTE — Chronic Care Management (AMB) (Signed)
  Care Management   Note  02/07/2021 Name: [name] MRN: [id number] DOB: November 01, 1945  [name] is a 75 y.o. year old female who is a primary care patient of [name], MD and is actively engaged with the care management team. I reached out to Karle Plumber by phone today to assist with re-scheduling a follow up visit with the RN Case Manager  Follow up plan: Unsuccessful telephone outreach attempt made. A HIPAA compliant phone message was left for the patient providing contact information and requesting a return call.  The care management team will reach out to the patient again over the next 7 days.  If patient returns call to provider office, please advise to call Embedded Care Management Care Guide Misty Stanley at [phone].  [name]  Care Guide, Embedded Care Coordination [facility] Management  Direct Dial: [phone]

## 2021-02-08 ENCOUNTER — Other Ambulatory Visit: Payer: Self-pay

## 2021-02-08 ENCOUNTER — Ambulatory Visit: Payer: Medicare PPO | Admitting: Internal Medicine

## 2021-02-08 ENCOUNTER — Encounter: Payer: Self-pay | Admitting: Internal Medicine

## 2021-02-08 VITALS — BP 112/70 | HR 57 | Temp 98.6°F | Ht 61.8 in | Wt 143.6 lb

## 2021-02-08 DIAGNOSIS — I2510 Atherosclerotic heart disease of native coronary artery without angina pectoris: Secondary | ICD-10-CM

## 2021-02-08 DIAGNOSIS — I1310 Hypertensive heart and chronic kidney disease without heart failure, with stage 1 through stage 4 chronic kidney disease, or unspecified chronic kidney disease: Secondary | ICD-10-CM | POA: Diagnosis not present

## 2021-02-08 DIAGNOSIS — Z6826 Body mass index (BMI) 26.0-26.9, adult: Secondary | ICD-10-CM

## 2021-02-08 DIAGNOSIS — E1122 Type 2 diabetes mellitus with diabetic chronic kidney disease: Secondary | ICD-10-CM | POA: Diagnosis not present

## 2021-02-08 DIAGNOSIS — Z23 Encounter for immunization: Secondary | ICD-10-CM

## 2021-02-08 DIAGNOSIS — N182 Chronic kidney disease, stage 2 (mild): Secondary | ICD-10-CM

## 2021-02-08 DIAGNOSIS — Z79899 Other long term (current) drug therapy: Secondary | ICD-10-CM

## 2021-02-08 DIAGNOSIS — E039 Hypothyroidism, unspecified: Secondary | ICD-10-CM

## 2021-02-08 MED ORDER — ZOSTER VAC RECOMB ADJUVANTED 50 MCG/0.5ML IM SUSR: 0.5000 mL | Freq: Once | INTRAMUSCULAR | 0 refills | Status: AC

## 2021-02-08 MED ORDER — ZOSTER VAC RECOMB ADJUVANTED 50 MCG/0.5ML IM SUSR: 0.5000 mL | Freq: Once | INTRAMUSCULAR | 0 refills | Status: DC

## 2021-02-08 NOTE — Chronic Care Management (AMB) (Signed)
  Care Management   Note  02/08/2021 Name: [place] Huberty MRN: [id number] DOB: February 05, 1946  Leilani Cespedes is a 75 y.o. year old female who is a primary care patient of [name], MD and is actively engaged with the care management team. I reached out to Karle Plumber by phone today to assist with re-scheduling a follow up visit with the RN Case Manager  Follow up plan: Telephone appointment with care management team member scheduled for:03/16/21  [facility] [place] Guide, Embedded Care Coordination [facility]  Care Management  Direct Dial: [phone]

## 2021-02-08 NOTE — Progress Notes (Signed)
[name],acting as a Education administrator for Catherine Greenland, MD.,have documented all relevant documentation on the behalf of Catherine Greenland, MD,as directed by  Catherine Greenland, MD while in the presence of Catherine Greenland, MD.  This visit occurred during the SARS-CoV-2 public health emergency.  Safety protocols were in place, including screening questions prior to the visit, additional usage of staff PPE, and extensive cleaning of exam room while observing appropriate contact time as indicated for disinfecting solutions.  Subjective:     Patient ID: [name] , female    DOB: 12/05/1945 , 75 y.o.   MRN: [id number]   Chief Complaint  Patient presents with   Hypertension   Diabetes    HPI  The patient is here today for a follow-up on her diabetes and blood pressure. She reports compliance with meds. She denies headaches, chest pain and shortness of breath.   Hypertension This is a chronic problem. The current episode started more than 1 year ago. The problem has been gradually improving since onset. The problem is controlled. Pertinent negatives include no blurred vision or headaches. The current treatment provides moderate improvement. 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  Diabetes She presents for her follow-up diabetic visit. She has type 2 diabetes mellitus. Pertinent negatives for hypoglycemia include no headaches. Pertinent negatives for diabetes include no blurred vision. There are no hypoglycemic complications. Diabetic complications include heart disease and nephropathy. Risk factors for coronary artery disease include diabetes mellitus, dyslipidemia, hypertension and post-menopausal. She is following a diabetic diet. She participates in exercise intermittently. An ACE inhibitor/angiotensin II receptor blocker is being taken. Eye exam is current.    Past Medical History:  Diagnosis Date   Benign hypertensive renal disease    CKD stage 2 due to  type 2 diabetes mellitus (HCC)    Hypertension    Hypothyroidism    Obesity    OSA on CPAP    Pure hypercholesterolemia    PVD (peripheral vascular disease) (HCC)    Vitamin D deficiency      Family History  Problem Relation Age of Onset   Alzheimer's disease Mother    Aneurysm Father    Cancer Brother      Current Outpatient Medications:    albuterol (PROVENTIL HFA;VENTOLIN HFA) 108 (90 Base) MCG/ACT inhaler, Inhale 2 puffs into the lungs every 6 (six) hours as needed for wheezing or shortness of breath., Disp: 1 Inhaler, Rfl: 2   amLODipine (NORVASC) 5 MG tablet, Take 5 mg by mouth at bedtime. 1/2 tab (Dr. Doylene Canard), Disp: , Rfl:    aspirin EC 81 MG tablet, Take 81 mg by mouth daily., Disp: , Rfl:    b complex vitamins tablet, Take 1 tablet by mouth daily with lunch. , Disp: , Rfl:    Calcium Carb-Cholecalciferol (CALCIUM + D3 PO), Take 1 tablet by mouth at bedtime., Disp: , Rfl:    cholecalciferol (VITAMIN D) 25 MCG (1000 UNIT) tablet, Take 1 tablet by mouth daily. , Disp: , Rfl:    cilostazol (PLETAL) 100 MG tablet, TAKE 1 TABLET EVERY DAY, Disp: 90 tablet, Rfl: 1   Evolocumab (REPATHA SURECLICK) 937 MG/ML SOAJ, Inject 140 mg into the skin every 14 (fourteen) days., Disp: 1 mL, Rfl: 0   furosemide (LASIX) 20 MG tablet, TAKE 1 TABLET (20 MG TOTAL) BY MOUTH DAILY., Disp: 90 tablet, Rfl: 2   hydrALAZINE (APRESOLINE) 10 MG tablet, Take 1 tablet (10 mg total) by mouth every 8 (eight) hours. (  Patient taking differently: Take 10 mg by mouth 3 (three) times daily with meals.), Disp: 90 tablet, Rfl: 3   isosorbide mononitrate (IMDUR) 60 MG 24 hr tablet, TAKE 1 TABLET EVERY DAY, Disp: 90 tablet, Rfl: 2   levothyroxine (SYNTHROID) 88 MCG tablet, TAKE 1 TABLET EVERY DAY BEFORE BREAKFAST, Disp: 90 tablet, Rfl: 1   Magnesium 250 MG TABS, Take 1 tablet (250 mg total) by mouth at bedtime., Disp: 90 tablet, Rfl: 2   Multiple Vitamins-Minerals (MULTIVITAMIN WITH MINERALS) tablet, Take 1 tablet by  mouth daily with lunch. , Disp: , Rfl:    nitroGLYCERIN (NITROSTAT) 0.4 MG SL tablet, PLACE 1 TABLET UNDER THE TONGUE EVERY 5 MINUTES X 3 DOSES AS NEEDED FOR CHEST PAIN., Disp: 100 tablet, Rfl: 1   olmesartan (BENICAR) 40 MG tablet, TAKE 1 TABLET EVERY DAY, Disp: 90 tablet, Rfl: 2   potassium chloride (K-DUR) 10 MEQ tablet, Take 10 mEq by mouth 2 (two) times daily with breakfast and lunch. , Disp: , Rfl: 3   Semaglutide (OZEMPIC, 0.25 OR 0.5 MG/DOSE, [place]), Inject 0.25 mg into the skin every Thursday. Every other week (Dr. Doylene Canard), Disp: , Rfl:    Allergies  Allergen Reactions   Atorvastatin Swelling and Other (See Comments)    Limbs swell   Clopidogrel Other (See Comments)    Caused bruising   Metoprolol Other (See Comments)    Slows heart rate too low   Metoprolol Tartrate Swelling and Other (See Comments)    Site of swelling not noted   Pregabalin Swelling and Other (See Comments)    Site of swelling not noted   Rosuvastatin Calcium Other (See Comments)    Unknown reaction     Review of Systems  Constitutional: Negative.   Eyes:  Negative for blurred vision.  Respiratory: Negative.    Cardiovascular: Negative.   Neurological: Negative.  Negative for headaches.  Psychiatric/Behavioral: Negative.      Today's Vitals   02/08/21 1129  BP: 112/70  Pulse: (!) 57  Temp: 98.6 F (37 C)  Weight: 143 lb 9.6 oz (65.1 kg)  Height: 5' 1.8" (1.57 m)  PainSc: 0-No pain   Body mass index is 26.44 kg/m.  Wt Readings from Last 3 Encounters:  02/08/21 143 lb 9.6 oz (65.1 kg)  10/03/20 144 lb 6.4 oz (65.5 kg)  05/26/20 137 lb 6.4 oz (62.3 kg)     Objective:  Physical Exam Vitals and nursing note reviewed.  Constitutional:      Appearance: Normal appearance.  HENT:     Head: Normocephalic and atraumatic.     Nose:     Comments: Masked     Mouth/Throat:     Comments: Masked  Eyes:     Extraocular Movements: Extraocular movements intact.  Cardiovascular:     Rate and Rhythm:  Normal rate and regular rhythm.     Heart sounds: Normal heart sounds.  Pulmonary:     Effort: Pulmonary effort is normal.     Breath sounds: Normal breath sounds.  Musculoskeletal:     Cervical back: Normal range of motion.  Skin:    General: Skin is warm.  Neurological:     General: No focal deficit present.     Mental Status: She is alert.  Psychiatric:        Mood and Affect: Mood normal.        Behavior: Behavior normal.        Assessment And Plan:     1. Hypertensive heart and  renal disease with renal failure, stage 1 through stage 4 or unspecified chronic kidney disease, without heart failure Comments: Chronic, well controlled. She will continue with current meds. Encouraged to follow low sodium diet.  - BMP8+EGFR  2. CAD in native artery Comments: Chronic, followed by Cardiology. She is encouraged to follow heart healthy lifestyle.   3. Type 2 diabetes mellitus with stage 2 chronic kidney disease, without long-term current use of insulin (HCC) Comments: Chronic, BP/BS log reviewed. I will check labs and adjust meds as needed.   - Hemoglobin A1c - BMP8+EGFR  4. Primary hypothyroidism Comments: Chronic, I will check thyroid panel and adjust meds as needed.  - TSH + free T4  5. Polypharmacy - Vitamin B12  6. BMI 26.0-26.9,adult Comments: Her BMI is acceptable for her demographic.  She is encouraged to aim for at least 150 minutes of exercise per week.   7. Immunization due Comments: She was given high dose flu vaccine.  I will send rx Shingrix to her local pharmacy.  - Flu Vaccine QUAD High Dose(Fluad) - Zoster Vaccine Adjuvanted [facility]) injection; Inject 0.5 mLs into the muscle once for 1 dose.  Dispense: 0.5 mL; Refill: 0   Patient was given opportunity to ask questions. Patient verbalized understanding of the plan and was able to repeat key elements of the plan. All questions were answered to their satisfaction.   I, Catherine Greenland, MD, have reviewed all  documentation for this visit. The documentation on 02/10/21 for the exam, diagnosis, procedures, and orders are all accurate and complete.   IF YOU HAVE BEEN REFERRED TO A SPECIALIST, IT MAY TAKE 1-2 WEEKS TO SCHEDULE/PROCESS THE REFERRAL. IF YOU HAVE NOT HEARD FROM US/SPECIALIST IN TWO WEEKS, PLEASE GIVE [place] A CALL AT [phone].   THE PATIENT IS ENCOURAGED TO PRACTICE SOCIAL DISTANCING DUE TO THE COVID-19 PANDEMIC.

## 2021-02-08 NOTE — Patient Instructions (Signed)

## 2021-02-09 LAB — TSH+FREE T4
Free T4: 1.44 ng/dL (ref 0.82–1.77)
TSH: 1.27 u[IU]/mL (ref 0.450–4.500)

## 2021-02-09 LAB — BMP8+EGFR
BUN/Creatinine Ratio: 17 (ref 12–28)
BUN: 19 mg/dL (ref 8–27)
CO2: 24 mmol/L (ref 20–29)
Calcium: 10 mg/dL (ref 8.7–10.3)
Chloride: 105 mmol/L (ref 96–106)
Creatinine, Ser: 1.09 mg/dL — ABNORMAL HIGH (ref 0.57–1.00)
Glucose: 155 mg/dL — ABNORMAL HIGH (ref 70–99)
Potassium: 4.7 mmol/L (ref 3.5–5.2)
Sodium: 142 mmol/L (ref 134–144)
eGFR: 53 mL/min/{1.73_m2} — ABNORMAL LOW (ref >59)

## 2021-02-09 LAB — HEMOGLOBIN A1C
Est. average glucose Bld gHb Est-mCnc: 131 mg/dL
Hgb A1c MFr Bld: 6.2 % — ABNORMAL HIGH (ref 4.8–5.6)

## 2021-02-09 LAB — VITAMIN B12: Vitamin B-12: 1075 pg/mL (ref 232–1245)

## 2021-02-10 ENCOUNTER — Telehealth: Payer: Medicare PPO

## 2021-02-23 ENCOUNTER — Ambulatory Visit (INDEPENDENT_AMBULATORY_CARE_PROVIDER_SITE_OTHER): Payer: Medicare PPO

## 2021-02-23 DIAGNOSIS — E1122 Type 2 diabetes mellitus with diabetic chronic kidney disease: Secondary | ICD-10-CM

## 2021-02-23 DIAGNOSIS — I1310 Hypertensive heart and chronic kidney disease without heart failure, with stage 1 through stage 4 chronic kidney disease, or unspecified chronic kidney disease: Secondary | ICD-10-CM

## 2021-02-23 MED ORDER — OZEMPIC (0.25 OR 0.5 MG/DOSE) 2 MG/1.5ML ~~LOC~~ SOPN: 0.5000 mg | PEN_INJECTOR | SUBCUTANEOUS | 1 refills | Status: AC

## 2021-02-23 NOTE — Addendum Note (Signed)
Addended by: Harlan Stains on: 02/23/2021 02:21 PM   Modules accepted: Orders

## 2021-02-23 NOTE — Progress Notes (Signed)
Chronic Care Management Pharmacy Note  02/23/2021 Name:  [name] MRN:  [id number] DOB:  03/13/1946  Summary: Patient reports that she has been checking her BS and BP everyday  Recommendations/Changes made from today's visit: Recommend patient increase her step frequency to 6000-7000 steps per day. Patient to receive Prevnar 20 vaccine.  Recommended patient receive COVID-19 booster vaccine.   Plan: Patient to receive Prevnar 20 vaccine on today at [facility], she will be scheduled for the COVID-19 booster clinic at [facility].   Subjective: [name] is an 75 y.o. year old female who is a primary patient of Glendale Chard, MD.  The CCM team was consulted for assistance with disease management and care coordination needs.    Engaged with patient by telephone for follow up visit in response to provider referral for pharmacy case management and/or care coordination services.   Consent to Services:  The patient was given information about Chronic Care Management services, agreed to services, and gave verbal consent prior to initiation of services.  Please see initial visit note for detailed documentation.   Patient Care Team: Glendale Chard, MD as PCP - General (Internal Medicine) Marylynn Areona Homer, MD as Consulting Physician (Ophthalmology) Lynne Logan, RN as Case Manager Mayford Knife, [facility] (Pharmacist)  Recent office visits: 02/08/2021 PCP OV   Recent consult visits: None noted in the past 6 months   Hospital visits: None in previous 6 months   Objective:  Lab Results  Component Value Date   CREATININE 1.09 (H) 02/08/2021   BUN 19 02/08/2021   GFRNONAA 52 (L) 01/31/2020   GFRAA 55 (L) 09/30/2019   NA 142 02/08/2021   K 4.7 02/08/2021   CALCIUM 10.0 02/08/2021   CO2 24 02/08/2021   GLUCOSE 155 (H) 02/08/2021    Lab Results  Component Value Date/Time   HGBA1C 6.2 (H) 02/08/2021 12:07 PM   HGBA1C 6.2 (H)  10/03/2020 12:18 PM   MICROALBUR 10 05/26/2020 02:40 PM   MICROALBUR 10 05/27/2019 11:30 AM    Last diabetic Eye exam:  Lab Results  Component Value Date/Time   HMDIABEYEEXA No Retinopathy 02/27/2019 12:00 AM    Last diabetic Foot exam: No results found for: HMDIABFOOTEX   Lab Results  Component Value Date   CHOL 156 10/03/2020   HDL 83 10/03/2020   LDLCALC 63 10/03/2020   TRIG 44 10/03/2020   CHOLHDL 1.9 10/03/2020    Hepatic Function Latest Ref Rng & Units 10/03/2020 05/26/2020 09/30/2019  Total Protein 6.0 - 8.5 g/dL 6.4 6.4 6.4  Albumin 3.7 - 4.7 g/dL 4.6 4.3 4.6  AST 0 - 40 IU/L 37 45(H) 37  ALT 0 - 32 IU/L 19 36(H) 14  Alk Phosphatase 44 - 121 IU/L 56 54 48  Total Bilirubin 0.0 - 1.2 mg/dL 0.3 0.4 0.4    Lab Results  Component Value Date/Time   TSH 1.270 02/08/2021 12:07 PM   TSH 1.770 10/03/2020 12:18 PM   FREET4 1.44 02/08/2021 12:07 PM   FREET4 1.62 10/03/2020 12:18 PM    CBC Latest Ref Rng & Units 10/03/2020 01/31/2020 07/14/2019  WBC 3.4 - 10.8 x10E3/uL 4.0 3.2(L) 4.3  Hemoglobin 11.1 - 15.9 g/dL 12.2 12.1 11.4(L)  Hematocrit 34.0 - 46.6 % 36.3 36.3 34.9(L)  Platelets 150 - 450 x10E3/uL 258 242 226    Lab Results  Component Value Date/Time   VD25OH 44.9 06/02/2019 09:18 AM    Clinical ASCVD: Yes  The 10-year ASCVD risk  score (Arnett DK, et al., 2019) is: 48.3%   Values used to calculate the score:     Age: 75 years     Sex: Female     Is Non-Hispanic African American: Yes     Diabetic: Yes     Tobacco smoker: Yes     Systolic Blood Pressure: 937 mmHg     Is BP treated: Yes     HDL Cholesterol: 83 mg/dL     Total Cholesterol: 156 mg/dL    Depression screen [facility] 2/9 05/26/2020 09/30/2019 05/27/2019  Decreased Interest 0 0 0  Down, Depressed, Hopeless 0 0 0  PHQ - 2 Score 0 0 0  Altered sleeping - 0 1  Tired, decreased energy - 0 0  Change in appetite - 0 0  Feeling bad or failure about yourself  - 0 0  Trouble concentrating - 0 0  Moving slowly or  fidgety/restless - 0 0  Suicidal thoughts - 0 0  PHQ-9 Score - 0 1  Difficult doing work/chores - - Not difficult at all  Some recent data might be hidden     Social History   Tobacco Use  Smoking Status Never  Smokeless Tobacco Never   BP Readings from Last 3 Encounters:  02/08/21 112/70  10/03/20 120/60  05/26/20 120/60   Pulse Readings from Last 3 Encounters:  02/08/21 (!) 57  10/03/20 (!) 54  05/26/20 (!) 53   Wt Readings from Last 3 Encounters:  02/08/21 143 lb 9.6 oz (65.1 kg)  10/03/20 144 lb 6.4 oz (65.5 kg)  05/26/20 137 lb 6.4 oz (62.3 kg)   BMI Readings from Last 3 Encounters:  02/08/21 26.44 kg/m  10/03/20 26.58 kg/m  05/26/20 25.29 kg/m    Assessment/Interventions: Review of patient past medical history, allergies, medications, health status, including review of consultants reports, laboratory and other test data, was performed as part of comprehensive evaluation and provision of chronic care management services.   SDOH:  (Social Determinants of Health) assessments and interventions performed: No  SDOH Screenings   Alcohol Screen: Not on file  Depression (PHQ2-9): Low Risk    PHQ-2 Score: 0  Financial Resource Strain: Low Risk    Difficulty of Paying Living Expenses: Not hard at all  Food Insecurity: No Food Insecurity   Worried About Charity fundraiser in the Last Year: Never true   Ran Out of Food in the Last Year: Never true  Housing: Low Risk    Last Housing Risk Score: 0  Physical Activity: Sufficiently Active   Days of Exercise per Week: 7 days   Minutes of Exercise per Session: 30 min  Social Connections: Not on file  Stress: No Stress Concern Present   Feeling of Stress : Not at all  Tobacco Use: Low Risk    Smoking Tobacco Use: Never   Smokeless Tobacco Use: Never   Passive Exposure: Not on file  Transportation Needs: No Transportation Needs   Lack of Transportation (Medical): No   Lack of Transportation (Non-Medical): No     CCM Care Plan  Allergies  Allergen Reactions   Atorvastatin Swelling and Other (See Comments)    Limbs swell   Clopidogrel Other (See Comments)    Caused bruising   Metoprolol Other (See Comments)    Slows heart rate too low   Metoprolol Tartrate Swelling and Other (See Comments)    Site of swelling not noted   Pregabalin Swelling and Other (See Comments)    Site of  swelling not noted   Rosuvastatin Calcium Other (See Comments)    Unknown reaction    Medications Reviewed Today     Reviewed by Mayford Knife, RPH (Pharmacist) on 02/23/21 at 1003  Med List Status: <None>   Medication Order Taking? Sig Documenting Provider Last Dose Status Informant  albuterol (PROVENTIL HFA;VENTOLIN HFA) 108 (90 Base) MCG/ACT inhaler [id number]  Inhale 2 puffs into the lungs every 6 (six) hours as needed for wheezing or shortness of breath. Minette Brine, FNP  Active            Med Note Barbie Banner, [facility]   Tue Feb 02, 2020 10:52 AM)    amLODipine (NORVASC) 5 MG tablet [id number]  Take 5 mg by mouth at bedtime. 1/2 tab (Dr. Doylene Canard) [provider]  Active Self  aspirin EC 81 MG tablet [id number]  Take 81 mg by mouth daily. [provider]  Active            Med Note Duffy Bruce, Sherrie Mustache Jan 31, 2020 12:41 PM) Patient would not disclose  b complex vitamins tablet [id number]  Take 1 tablet by mouth daily with lunch.  [provider]  Active            Med Note Duffy Bruce, Sherrie Mustache Jan 31, 2020 12:42 PM) Patient would not disclose  Calcium Carb-Cholecalciferol (CALCIUM + D3 PO) [id number]  Take 1 tablet by mouth at bedtime. [provider]  Active            Med Note Duffy Bruce, Sherrie Mustache Jan 31, 2020 12:42 PM) Patient would not disclose  cholecalciferol (VITAMIN D) 25 MCG (1000 UNIT) tablet [id number]  Take 1 tablet by mouth daily.  [provider]  Active            Med Note Duffy Bruce, Sherrie Mustache Jan 31, 2020 12:42 PM) Patient would not disclose   cilostazol (PLETAL) 100 MG tablet [id number]  TAKE 1 TABLET EVERY DAY O'Neal, Cassie Freer, MD  Active   Evolocumab [facility] SURECLICK) 654 MG/ML Darden Palmer [id number]  Inject 140 mg into the skin every 14 (fourteen) days. Geralynn Rile, MD  Active   furosemide (LASIX) 20 MG tablet [id number]  TAKE 1 TABLET (20 MG TOTAL) BY MOUTH DAILY. Glendale Chard, MD  Active   hydrALAZINE (APRESOLINE) 10 MG tablet [id number]  Take 1 tablet (10 mg total) by mouth every 8 (eight) hours.  Patient taking differently: Take 10 mg by mouth 3 (three) times daily with meals.   Dixie Dials, MD  Active            Med Note Mayford Knife   Tue Aug 09, 2020 11:58 AM) Per last visit with Dr. Doylene Canard take two tablets twice per day.   isosorbide mononitrate (IMDUR) 60 MG 24 hr tablet [id number]  TAKE 1 TABLET EVERY DAY Glendale Chard, MD  Active   levothyroxine (SYNTHROID) 88 MCG tablet [id number]  TAKE 1 TABLET EVERY DAY BEFORE Adella Nissen, MD  Active   Magnesium 250 MG TABS [id number]  Take 1 tablet (250 mg total) by mouth at bedtime. Glendale Chard, MD  Active Self  Multiple Vitamins-Minerals (MULTIVITAMIN WITH MINERALS) tablet [id number]  Take 1 tablet by mouth daily with lunch.  [provider]  Active Self  nitroGLYCERIN (NITROSTAT) 0.4 MG SL tablet [id number]  PLACE 1 TABLET UNDER THE TONGUE EVERY 5 MINUTES X 3 DOSES AS NEEDED FOR CHEST PAIN. Baird Cancer,  Bailey Mech, MD  Active   olmesartan [facility]) 40 MG tablet [id number]  TAKE 1 TABLET EVERY DAY Glendale Chard, MD  Active            Med Note Simona Huh Aug 09, 2020 12:15 PM) Patient reports taking Olmesartan 20 mg tablet daily per Dr. Doylene Canard.   potassium chloride (K-DUR) 10 MEQ tablet [id number]  Take 10 mEq by mouth 2 (two) times daily with breakfast and lunch.  [provider]  Active Self  Semaglutide (OZEMPIC, 0.25 OR 0.5 MG/DOSE, [place]) [id number]  Inject 0.25 mg into the skin every Thursday. Every other week (Dr. Doylene Canard)  [provider]  Active Self           Med Note Mayford Knife   Tue Aug 09, 2020 12:17 PM) Taking once a week on Friday.             Patient Active Problem List   Diagnosis Date Noted   Hyperlipidemia 10/30/2019   Statin myopathy 10/30/2019   Acute bronchitis 06/06/2018   Type 2 diabetes mellitus with stage 2 chronic kidney disease, without long-term current use of insulin ([place]) 02/17/2018   Chronic renal disease, stage II 02/17/2018   Benign hypertensive heart and renal disease 02/17/2018   Primary hypothyroidism 02/17/2018   Localized osteoarthritis of right knee 02/17/2018   Essential hypertension 06/12/2017   Acute coronary syndrome ([place]) 12/26/2016   Hypertensive heart disease without heart failure 10/06/2015   Insomnia with sleep apnea 10/06/2015   OSA on CPAP 10/06/2015   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 10/06/2015   CAD in native artery 10/06/2015   Chest pain at rest 07/18/2015    Class: Acute   Weakness 07/18/2015    Class: Acute   Insomnia 05/05/2015   Primary snoring 05/05/2015    Immunization History  Administered Date(s) Administered   Fluad Quad(high Dose 65+) 02/08/2021   Influenza, High Dose Seasonal PF 03/30/2014, 01/31/2015, 04/04/2016, 12/18/2016, 11/13/2018, 01/04/2020   Influenza-Unspecified 01/24/2013, 12/21/2017   Moderna SARS-COV2 Booster Vaccination 02/05/2020   Moderna Sars-Covid-2 Vaccination 05/13/2019, 06/10/2019, 08/12/2020   Pneumococcal Polysaccharide-23 12/27/2016   Zoster, Live 02/03/2013    Conditions to be addressed/monitored:  Hypertension and Diabetes  Care Plan : Margaret  Updates made by Mayford Knife, [place] since 02/23/2021 12:00 AM     Problem: HTN, DM II   Priority: High  Onset Date: 05/12/2020     Long-Range Goal: Disease Management   Start Date: 05/12/2020  Recent Progress: On track  Priority: High  Note:   Current Barriers:  Unable to independently afford treatment  regimen  Pharmacist Clinical Goal(s):  Patient will verbalize ability to afford treatment regimen achieve adherence to monitoring guidelines and medication adherence to achieve therapeutic efficacy through collaboration with PharmD and provider.   Interventions: 1:1 collaboration with Glendale Chard, MD regarding development and update of comprehensive plan of care as evidenced by provider attestation and co-signature Inter-disciplinary care team collaboration (see longitudinal plan of care) Comprehensive medication review performed; medication list updated in electronic medical record   Hypertension (BP goal <130/80) -Controlled -Current treatment: Amlodipine 2.9m tablet once per day at bedtime Hydralazine 10 mg tablet - taking 1 tablet by mouth every 8 hours  Isosorbide 60 mg tablet once per day  Olmesartan 40 mg taking 1 tablet by mouth daily   -Current home readings: 11/27-149/72, 11/28 -141/63 HR: 51, 114/58 HR: 56, 97/59, 123/58 HR: 45 -Current dietary habits: she has been doing very  well with limiting salt -Current exercise habits: she walks all day and usually gets at least 5000 steps per day. She is on the treadmill once per week. She would like to increase the amount of steps she is taking to 6000-7000 steps twice per week.  -Denies hypotensive/hypertensive symptoms -Educated on Importance of home blood pressure monitoring; Proper BP monitoring technique; -Counseled to monitor BP at home at least five times per week, document, and provide log at future appointments -Recommended to continue current medication   Diabetes (A1c goal <7%) -Controlled -Current medications: Ozempic 0.5 mg inject once per week  Patient was on Ozempic 0.25 mg - has since changed  -Current home glucose readings fasting glucose: 11/27 - 88, 11/28-108, 11/29 - 123, 11/30 - 98, 104, 12/1 - 111 -Denies hypoglycemic/hyperglycemic symptoms -Patient is monitoring BS daily and makes a note of what she  has eaten if her BS is elevated  -Current meal patterns:  breakfast: this is her biggest meal of day, quiche with spinach, sausage and ham chunks , sometimes other varieties  lunch: maybe applesauce  dinner: spaghetti, barbeque sandwich, hot dogs  snacks: banana pudding - moderation a 1/2 of a cup.  drinks: plenty of water, drinking 6-7 bottles of 16.9 ounces of water per day.  -Current exercise: please see hypertension  -Educated on A1c and blood sugar goals; Counseled to check feet daily and get yearly eye exams  -Patient reports that she is checking her feet frequently   -She was supposed to have an eye exam on 02/21/2021 Dr. Venetia Maxon rescheduled to 03/15/2021 -Recommended to continue current medication  Health Maintenance -Vaccine gaps: Pneumonia Vaccine - scheduled patient for Prevnar 20 vaccine at [facility] on [address]. -Discussed with patient the spacing of the Pneumonia vaccine and the requirements    -Educated on the different types of pneumonia vaccines and the importance of receiving the vaccine   Patient Goals/Self-Care Activities Patient will:  - take medications as prescribed as evidenced by patient report and record review  Follow Up Plan: The patient has been provided with contact information for the care management team and has been advised to call with any health related questions or concerns.       Medication Assistance:  Ozempic obtained through [facility] medication assistance program.  Enrollment ends 02/2021  Compliance/Adherence/Medication fill history: Care Gaps: Pneumonia Vaccine - patient to receive Prevnar 20 she has already received Pneumovax 23  Shingrix Vaccine  COVID-19 Vaccine  [facility]: Olmesartan 40 mg tablet   Patient's preferred pharmacy is:  [facility], [place] [address] Phone: [phone] Fax: [place], [place] - [address] [place] AT [facility] [place] [address] Phone: [phone] Fax: [phone]  Uses pill box? Yes Pt endorses 95% compliance  We discussed: Benefits of medication synchronization, packaging and delivery as well as enhanced pharmacist oversight with [employer]. Patient decided to: Continue current medication management strategy  Care Plan and Follow Up Patient Decision:  Patient agrees to Care Plan and Follow-up.  Plan: The patient has been provided with contact information for the care management team and has been advised to call with any health related questions or concerns.   Orlando Penner, CPP, PharmD Clinical Pharmacist Practitioner [facility] [phone]

## 2021-02-23 NOTE — Addendum Note (Signed)
Addended by: Harlan Stains on: 02/23/2021 02:14 PM   Modules accepted: Orders

## 2021-02-23 NOTE — Patient Instructions (Signed)
Visit Information It was great speaking with you today!  Please let me know if you have any questions about our visit.   Goals Addressed             This Visit's Progress    Manage My Medicine       Timeframe:  Long-Range Goal Priority:  High Start Date:                             Expected End Date:                       Follow Up Date 08/16/2021   In Progress:  - call for medicine refill 2 or 3 days before it runs out - call if I am sick and can't take my medicine - use a pillbox to sort medicine    Why is this important?   These steps will help you keep on track with your medicines. Call if you have any questions about your medications.            Patient Care Plan: CCM Pharmacy Care Plan     Problem Identified: HTN, DM II   Priority: High  Onset Date: 05/12/2020     Long-Range Goal: Disease Management   Start Date: 05/12/2020  Recent Progress: On track  Priority: High  Note:   Current Barriers:  Unable to independently afford treatment regimen  Pharmacist Clinical Goal(s):  Patient will verbalize ability to afford treatment regimen achieve adherence to monitoring guidelines and medication adherence to achieve therapeutic efficacy through collaboration with PharmD and provider.   Interventions: 1:1 collaboration with [name], MD regarding development and update of comprehensive plan of care as evidenced by provider attestation and co-signature Inter-disciplinary care team collaboration (see longitudinal plan of care) Comprehensive medication review performed; medication list updated in electronic medical record   Hypertension (BP goal <130/80) -Controlled -Current treatment: Amlodipine 2.5mg  tablet once per day at bedtime Hydralazine 10 mg tablet - taking 1 tablet by mouth every 8 hours  Isosorbide 60 mg tablet once per day  Olmesartan 40 mg taking 1 tablet by mouth daily   -Current home readings: 11/27-149/72, 11/28 -141/63 HR: 51, 114/58  HR: 56, 97/59, 123/58 HR: 45 -Current dietary habits: she has been doing very well with limiting salt -Current exercise habits: she walks all day and usually gets at least 5000 steps per day. She is on the treadmill once per week. She would like to increase the amount of steps she is taking to 6000-7000 steps twice per week.  -Denies hypotensive/hypertensive symptoms -Educated on Importance of home blood pressure monitoring; Proper BP monitoring technique; -Counseled to monitor BP at home at least five times per week, document, and provide log at future appointments -Recommended to continue current medication  Diabetes (A1c goal <7%) -Controlled -Current medications: Ozempic 0.5 mg inject once per week  Patient was on Ozempic 0.25 mg - has since changed  -Current home glucose readings fasting glucose: 11/27 - 88, 11/28-108, 11/29 - 123, 11/30 - 98, 104, 12/1 - 111 -Denies hypoglycemic/hyperglycemic symptoms -Patient is monitoring BS daily and makes a note of what she has eaten if her BS is elevated  -Current meal patterns:  breakfast: this is her biggest meal of day, quiche with spinach, sausage and ham chunks , sometimes other varieties  lunch: maybe applesauce  dinner: spaghetti, barbeque sandwich, hot dogs  snacks: banana pudding - moderation a  1/2 of a cup.  drinks: plenty of water, drinking 6-7 bottles of 16.9 ounces of water per day.  -Current exercise: please see hypertension  -Educated on A1c and blood sugar goals; Counseled to check feet daily and get yearly eye exams  -Patient reports that she is checking her feet frequently   -She was supposed to have an eye exam on 02/21/2021 Dr. Harlon Flor rescheduled to 03/15/2021 -Recommended to continue current medication  Health Maintenance -Vaccine gaps: Pneumonia Vaccine - [facility] on [address]. -Discussed with patient the spacing of the Pneumonia vaccine and the requirements     -Educated on the different types of pneumonia vaccines and the importance of receiving the vaccine   Patient Goals/Self-Care Activities Patient will:  - take medications as prescribed as evidenced by patient report and record review  Follow Up Plan: The patient has been provided with contact information for the care management team and has been advised to call with any health related questions or concerns.       Patient agreed to services and verbal consent obtained.   The patient verbalized understanding of instructions, educational materials, and care plan provided today and agreed to receive a mailed copy of patient instructions, educational materials, and care plan.   [name], PharmD Clinical Pharmacist [facility] [phone]

## 2021-03-07 ENCOUNTER — Ambulatory Visit
Admission: RE | Admit: 2021-03-07 | Discharge: 2021-03-07 | Disposition: A | Discharge status: Home / Self Care | Payer: Medicare PPO | Source: Ambulatory Visit | Attending: Internal Medicine | Admitting: Internal Medicine

## 2021-03-07 ENCOUNTER — Other Ambulatory Visit: Payer: Self-pay | Admitting: Internal Medicine

## 2021-03-07 DIAGNOSIS — Z1231 Encounter for screening mammogram for malignant neoplasm of breast: Secondary | ICD-10-CM | POA: Diagnosis not present

## 2021-03-15 DIAGNOSIS — H401131 Primary open-angle glaucoma, bilateral, mild stage: Secondary | ICD-10-CM | POA: Diagnosis not present

## 2021-03-15 DIAGNOSIS — H2513 Age-related nuclear cataract, bilateral: Secondary | ICD-10-CM | POA: Diagnosis not present

## 2021-03-15 DIAGNOSIS — H16223 Keratoconjunctivitis sicca, not specified as Sjogren's, bilateral: Secondary | ICD-10-CM | POA: Diagnosis not present

## 2021-03-15 DIAGNOSIS — E119 Type 2 diabetes mellitus without complications: Secondary | ICD-10-CM | POA: Diagnosis not present

## 2021-03-15 LAB — HM DIABETES EYE EXAM

## 2021-03-16 ENCOUNTER — Telehealth: Payer: Medicare PPO

## 2021-03-16 ENCOUNTER — Telehealth: Payer: Self-pay

## 2021-03-16 NOTE — Telephone Encounter (Signed)
°  Care Management   Follow Up Note   03/16/2021 Name: [name] MRN: [id number] DOB: [birth year]   Referred by: [name], MD Reason for referral : Chronic Care Management (RN CM Follow up call )   An unsuccessful telephone outreach was attempted today. The patient was referred to the case management team for assistance with care management and care coordination.   Follow Up Plan: Telephone follow up appointment with care management team member scheduled for: 03/23/21  Delsa Sale, RN, BSN, CCM Care Management Coordinator [facility] Care Management/[facility]  Direct Phone: [phone]

## 2021-03-23 ENCOUNTER — Telehealth: Payer: Medicare PPO

## 2021-03-23 ENCOUNTER — Ambulatory Visit: Payer: Self-pay

## 2021-03-23 DIAGNOSIS — E1122 Type 2 diabetes mellitus with diabetic chronic kidney disease: Secondary | ICD-10-CM

## 2021-03-23 DIAGNOSIS — I10 Essential (primary) hypertension: Secondary | ICD-10-CM

## 2021-03-23 DIAGNOSIS — E7800 Pure hypercholesterolemia, unspecified: Secondary | ICD-10-CM

## 2021-03-23 DIAGNOSIS — E039 Hypothyroidism, unspecified: Secondary | ICD-10-CM

## 2021-03-23 DIAGNOSIS — I2510 Atherosclerotic heart disease of native coronary artery without angina pectoris: Secondary | ICD-10-CM

## 2021-03-24 NOTE — Chronic Care Management (AMB) (Signed)
Chronic Care Management   CCM RN Visit Note  03/23/2021 Name: [name] MRN: [id number] DOB: [birth year]  Subjective: Geral Tuch is a 75 y.o. year old female who is a primary care patient of Glendale Chard, MD. The care management team was consulted for assistance with disease management and care coordination needs.    Engaged with patient by telephone for follow up visit in response to provider referral for case management and/or care coordination services.   Consent to Services:  The patient was given information about Chronic Care Management services, agreed to services, and gave verbal consent prior to initiation of services.  Please see initial visit note for detailed documentation.   Patient agreed to services and verbal consent obtained.   Assessment: Review of patient past medical history, allergies, medications, health status, including review of consultants reports, laboratory and other test data, was performed as part of comprehensive evaluation and provision of chronic care management services.   SDOH (Social Determinants of Health) assessments and interventions performed:    CCM Care Plan  Allergies  Allergen Reactions   Atorvastatin Swelling and Other (See Comments)    Limbs swell   Clopidogrel Other (See Comments)    Caused bruising   Metoprolol Other (See Comments)    Slows heart rate too low   Metoprolol Tartrate Swelling and Other (See Comments)    Site of swelling not noted   Pregabalin Swelling and Other (See Comments)    Site of swelling not noted   Rosuvastatin Calcium Other (See Comments)    Unknown reaction    Outpatient Encounter Medications as of 03/23/2021  Medication Sig Note   albuterol (PROVENTIL HFA;VENTOLIN HFA) 108 (90 Base) MCG/ACT inhaler Inhale 2 puffs into the lungs every 6 (six) hours as needed for wheezing or shortness of breath.    amLODipine (NORVASC) 5 MG tablet Take 5 mg by mouth at bedtime. 1/2 tab (Dr. Doylene Canard)     Apoaequorin (PREVAGEN PO) Take 1 tablet by mouth daily.    aspirin EC 81 MG tablet Take 81 mg by mouth daily. 01/31/2020: Patient would not disclose   b complex vitamins tablet Take 1 tablet by mouth daily with lunch.  01/31/2020: Patient would not disclose   Calcium Carb-Cholecalciferol (CALCIUM + D3 PO) Take 1 tablet by mouth at bedtime. 01/31/2020: Patient would not disclose   cholecalciferol (VITAMIN D) 25 MCG (1000 UNIT) tablet Take 1 tablet by mouth daily.  01/31/2020: Patient would not disclose   cilostazol (PLETAL) 100 MG tablet TAKE 1 TABLET EVERY DAY    Evolocumab (REPATHA SURECLICK) 268 MG/ML SOAJ Inject 140 mg into the skin every 14 (fourteen) days.    furosemide (LASIX) 20 MG tablet TAKE 1 TABLET (20 MG TOTAL) BY MOUTH DAILY.    hydrALAZINE (APRESOLINE) 10 MG tablet Take 1 tablet (10 mg total) by mouth every 8 (eight) hours. (Patient taking differently: Take 10 mg by mouth 3 (three) times daily with meals.) 08/09/2020: Per last visit with Dr. Doylene Canard take two tablets twice per day.    isosorbide mononitrate (IMDUR) 60 MG 24 hr tablet TAKE 1 TABLET EVERY DAY    levothyroxine (SYNTHROID) 88 MCG tablet TAKE 1 TABLET EVERY DAY BEFORE BREAKFAST    Magnesium 250 MG TABS Take 1 tablet (250 mg total) by mouth at bedtime.    Multiple Vitamins-Minerals (MULTIVITAMIN WITH MINERALS) tablet Take 1 tablet by mouth daily with lunch.     nitroGLYCERIN (NITROSTAT) 0.4 MG SL tablet PLACE 1 TABLET UNDER THE TONGUE  EVERY 5 MINUTES X 3 DOSES AS NEEDED FOR CHEST PAIN.    olmesartan (BENICAR) 40 MG tablet TAKE 1 TABLET EVERY DAY 08/09/2020: Patient reports taking Olmesartan 20 mg tablet daily per Dr. Doylene Canard.    potassium chloride (K-DUR) 10 MEQ tablet Take 10 mEq by mouth 2 (two) times daily with breakfast and lunch.     Semaglutide,0.25 or 0.5MG/DOS, (OZEMPIC, 0.25 OR 0.5 MG/DOSE,) 2 MG/1.5ML SOPN Inject 0.5 mg into the skin once a week.    No facility-administered encounter medications on file as of  03/23/2021.    Patient Active Problem List   Diagnosis Date Noted   Hyperlipidemia 10/30/2019   Statin myopathy 10/30/2019   Acute bronchitis 06/06/2018   Type 2 diabetes mellitus with stage 2 chronic kidney disease, without long-term current use of insulin ([place]) 02/17/2018   Chronic renal disease, stage II 02/17/2018   Benign hypertensive heart and renal disease 02/17/2018   Primary hypothyroidism 02/17/2018   Localized osteoarthritis of right knee 02/17/2018   Essential hypertension 06/12/2017   Acute coronary syndrome ([place]) 12/26/2016   Hypertensive heart disease without heart failure 10/06/2015   Insomnia with sleep apnea 10/06/2015   OSA on CPAP 10/06/2015   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 10/06/2015   CAD in native artery 10/06/2015   Chest pain at rest 07/18/2015    Class: Acute   Weakness 07/18/2015    Class: Acute   Insomnia 05/05/2015   Primary snoring 05/05/2015    Conditions to be addressed/monitored: Tpe 2 DM with stage II CKD, Hypothyroidism, Hypercholesterolemia, HTN, CAD  Care Plan : RN Care Manager Plan of Care  Updates made by Lynne Logan, RN since 03/24/2021 12:00 AM     Problem: No Plan of Care established for management of chronic disease states (Type 2 DM with stage II CKD, Hypothyroidism, Hypercholesterolemia, HTN, CAD)   Priority: High     Long-Range Goal: Establishment of plan of care for management of chronic disease states (Type 2 DM with stage II CKD, Hypothyroidism, Hypercholesterolemia, HTN, CAD)   Start Date: 03/23/2021  Expected End Date: 03/23/2022  This Visit's Progress: On track  Priority: High  Note:   Current Barriers:  Knowledge Deficits related to plan of care for management of Type 2 DM with stage II CKD, Hypothyroidism, Hypercholesterolemia, HTN, CAD  Chronic Disease Management support and education needs related to Type 2 DM with stage II CKD, Hypothyroidism, Hypercholesterolemia, HTN, CAD   RNCM Clinical Goal(s):  Patient  will verbalize basic understanding of  Type 2 DM with stage II CKD, Hypothyroidism, Hypercholesterolemia, HTN, CAD disease process and self health management plan as evidenced by patient will report having no disease exacerbations related to her chronic disease states  take all medications exactly as prescribed and will call provider for medication related questions as evidenced by patient will report having no missed doses of her prescribed medications demonstrate Ongoing health management independence as evidenced by patient will report 100% adherence to her prescribed treatment plan  continue to work with RN Care Manager to address care management and care coordination needs related to  Type 2 DM with stage II CKD, Hypothyroidism, Hypercholesterolemia, HTN, CAD as evidenced by adherence to CM Team Scheduled appointments demonstrate ongoing self health care management ability   as evidenced by    through collaboration with RN Care manager, provider, and care team.   Interventions: 1:1 collaboration with primary care provider regarding development and update of comprehensive plan of care as evidenced by provider attestation and co-signature  Inter-disciplinary care team collaboration (see longitudinal plan of care) Evaluation of current treatment plan related to  self management and patient's adherence to plan as established by provider    Chronic Kidney Disease Interventions:  (Status:  Goal on track:  Yes.) Long Term Goal Assessed the Patient understanding of chronic kidney disease    Reviewed prescribed diet increase daily water intake to 64 oz daily unless otherwise directed  Discussed complications of poorly controlled blood pressure such as heart disease, stroke, circulatory complications, vision complications, kidney impairment, sexual dysfunction    Provided education on kidney disease progression    Discussed plans with patient for ongoing care management follow up and provided patient with  direct contact information for care management team Last practice recorded BP readings:  BP Readings from Last 3 Encounters:  02/08/21 112/70  10/03/20 120/60  05/26/20 120/60  Most recent eGFR/CrCl:  Lab Results  Component Value Date   EGFR 53 (L) 02/08/2021    No components found for: CRCL    Diabetes Interventions:  (Status:  Goal on track:  Yes.) Long Term Goal Assessed patient's understanding of A1c goal:  <5.7 Provided education to patient about basic DM disease process Reviewed medications with patient and discussed importance of medication adherence Provided patient with written educational materials related to hypo and hyperglycemia and importance of correct treatment Advised patient, providing education and rationale, to check cbg daily before breakfast and record, calling PCP and or RN CM for findings outside established parameters Review of patient status, including review of consultants reports, relevant laboratory and other test results, and medications completed Discussed plans with patient for ongoing care management follow up and provided patient with direct contact information for care management team Lab Results  Component Value Date   HGBA1C 6.2 (H) 02/08/2021   Patient Goals/Self-Care Activities: Take all medications as prescribed Attend all scheduled provider appointments Call pharmacy for medication refills 3-7 days in advance of running out of medications Perform all self care activities independently  Perform IADL's (shopping, preparing meals, housekeeping, managing finances) independently Call provider office for new concerns or questions  drink 6 to 8 glasses of water each day fill half of plate with vegetables manage portion size  Follow Up Plan:  Telephone follow up appointment with care management team member scheduled for:  06/02/21      Plan:Telephone follow up appointment with care management team member scheduled for:  06/02/21  Barb Merino, RN, BSN, CCM Care Management Coordinator [place] [facility]  Direct Phone: [phone]

## 2021-03-24 NOTE — Patient Instructions (Signed)
Visit Information  Thank you for taking time to visit with me today. Please don't hesitate to contact me if I can be of assistance to you before our next scheduled telephone appointment.  Following are the goals we discussed today:  (Copy and paste patient goals from clinical care plan here)  Our next appointment is by telephone on 06/02/21 at 12:05 PM   Please call the care guide team at [phone] if you need to cancel or reschedule your appointment.   If you are experiencing a Mental Health or Behavioral Health Crisis or need someone to talk to, please call 1-800-273-TALK (toll free, 24 hour hotline)   Patient verbalizes understanding of instructions provided today and agrees to view in MyChart.    Delsa Sale, RN, BSN, CCM Care Management Coordinator [facility]  Direct Phone: [phone]

## 2021-03-25 DIAGNOSIS — E7800 Pure hypercholesterolemia, unspecified: Secondary | ICD-10-CM | POA: Diagnosis not present

## 2021-03-25 DIAGNOSIS — I10 Essential (primary) hypertension: Secondary | ICD-10-CM

## 2021-03-25 DIAGNOSIS — E1122 Type 2 diabetes mellitus with diabetic chronic kidney disease: Secondary | ICD-10-CM

## 2021-03-25 DIAGNOSIS — E039 Hypothyroidism, unspecified: Secondary | ICD-10-CM | POA: Diagnosis not present

## 2021-03-25 DIAGNOSIS — I2510 Atherosclerotic heart disease of native coronary artery without angina pectoris: Secondary | ICD-10-CM | POA: Diagnosis not present

## 2021-03-25 DIAGNOSIS — I1310 Hypertensive heart and chronic kidney disease without heart failure, with stage 1 through stage 4 chronic kidney disease, or unspecified chronic kidney disease: Secondary | ICD-10-CM

## 2021-03-25 DIAGNOSIS — N182 Chronic kidney disease, stage 2 (mild): Secondary | ICD-10-CM

## 2021-04-04 ENCOUNTER — Ambulatory Visit (INDEPENDENT_AMBULATORY_CARE_PROVIDER_SITE_OTHER): Payer: Medicare PPO

## 2021-04-04 ENCOUNTER — Other Ambulatory Visit: Payer: Self-pay | Admitting: Internal Medicine

## 2021-04-04 DIAGNOSIS — I10 Essential (primary) hypertension: Secondary | ICD-10-CM

## 2021-04-04 DIAGNOSIS — N182 Chronic kidney disease, stage 2 (mild): Secondary | ICD-10-CM

## 2021-04-04 NOTE — Chronic Care Management (AMB) (Signed)
Chronic Care Management    Social Work Note  04/04/2021 Name: [name] MRN: [id number] DOB: 1945-12-15  [name] is a 76 y.o. year old female who is a primary care patient of Glendale Chard, MD. The CCM team was consulted to assist the patient with chronic disease management and/or care coordination needs related to:  DM II, CKD II, HTN .   Engaged with patient by telephone for follow up visit in response to provider referral for social work chronic care management and care coordination services.   Consent to Services:  The patient was given information about Chronic Care Management services, agreed to services, and gave verbal consent prior to initiation of services.  Please see initial visit note for detailed documentation.   Patient agreed to services and consent obtained.   Assessment: Review of patient past medical history, allergies, medications, and health status, including review of relevant consultants reports was performed today as part of a comprehensive evaluation and provision of chronic care management and care coordination services.     SDOH (Social Determinants of Health) assessments and interventions performed:    Advanced Directives Status: Not addressed in this encounter.  CCM Care Plan  Allergies  Allergen Reactions   Atorvastatin Swelling and Other (See Comments)    Limbs swell   Clopidogrel Other (See Comments)    Caused bruising   Metoprolol Other (See Comments)    Slows heart rate too low   Metoprolol Tartrate Swelling and Other (See Comments)    Site of swelling not noted   Pregabalin Swelling and Other (See Comments)    Site of swelling not noted   Rosuvastatin Calcium Other (See Comments)    Unknown reaction    Outpatient Encounter Medications as of 04/04/2021  Medication Sig Note   albuterol (PROVENTIL HFA;VENTOLIN HFA) 108 (90 Base) MCG/ACT inhaler Inhale 2 puffs into the lungs every 6 (six) hours as needed for wheezing or  shortness of breath.    amLODipine (NORVASC) 5 MG tablet Take 5 mg by mouth at bedtime. 1/2 tab (Dr. Doylene Canard)    Apoaequorin (PREVAGEN PO) Take 1 tablet by mouth daily.    aspirin EC 81 MG tablet Take 81 mg by mouth daily. 01/31/2020: Patient would not disclose   b complex vitamins tablet Take 1 tablet by mouth daily with lunch.  01/31/2020: Patient would not disclose   Calcium Carb-Cholecalciferol (CALCIUM + D3 PO) Take 1 tablet by mouth at bedtime. 01/31/2020: Patient would not disclose   cholecalciferol (VITAMIN D) 25 MCG (1000 UNIT) tablet Take 1 tablet by mouth daily.  01/31/2020: Patient would not disclose   cilostazol (PLETAL) 100 MG tablet TAKE 1 TABLET EVERY DAY    Evolocumab (REPATHA SURECLICK) [id number] MG/ML SOAJ Inject 140 mg into the skin every 14 (fourteen) days.    furosemide (LASIX) 20 MG tablet TAKE 1 TABLET (20 MG TOTAL) BY MOUTH DAILY.    GNP MAGNESIUM OXIDE 250 MG TABS TAKE 1 TABLET EVERY DAY    hydrALAZINE (APRESOLINE) 10 MG tablet Take 1 tablet (10 mg total) by mouth every 8 (eight) hours. (Patient taking differently: Take 10 mg by mouth 3 (three) times daily with meals.) 08/09/2020: Per last visit with Dr. Doylene Canard take two tablets twice per day.    isosorbide mononitrate (IMDUR) 60 MG 24 hr tablet TAKE 1 TABLET EVERY DAY    levothyroxine (SYNTHROID) 88 MCG tablet TAKE 1 TABLET EVERY DAY BEFORE BREAKFAST    Magnesium 250 MG TABS Take 1 tablet (250 mg  total) by mouth at bedtime.    Multiple Vitamins-Minerals (MULTIVITAMIN WITH MINERALS) tablet Take 1 tablet by mouth daily with lunch.     nitroGLYCERIN (NITROSTAT) 0.4 MG SL tablet PLACE 1 TABLET UNDER THE TONGUE EVERY 5 MINUTES X 3 DOSES AS NEEDED FOR CHEST PAIN.    olmesartan (BENICAR) 40 MG tablet TAKE 1 TABLET EVERY DAY 08/09/2020: Patient reports taking Olmesartan 20 mg tablet daily per Dr. Doylene Canard.    potassium chloride (K-DUR) 10 MEQ tablet Take 10 mEq by mouth 2 (two) times daily with breakfast and lunch.     Semaglutide,0.25 or  0.5MG /DOS, (OZEMPIC, 0.25 OR 0.5 MG/DOSE,) 2 MG/1.5ML SOPN Inject 0.5 mg into the skin once a week.    No facility-administered encounter medications on file as of 04/04/2021.    Patient Active Problem List   Diagnosis Date Noted   Hyperlipidemia 10/30/2019   Statin myopathy 10/30/2019   Acute bronchitis 06/06/2018   Type 2 diabetes mellitus with stage 2 chronic kidney disease, without long-term current use of insulin ([place]) 02/17/2018   Chronic renal disease, stage II 02/17/2018   Benign hypertensive heart and renal disease 02/17/2018   Primary hypothyroidism 02/17/2018   Localized osteoarthritis of right knee 02/17/2018   Essential hypertension 06/12/2017   Acute coronary syndrome ([place]) 12/26/2016   Hypertensive heart disease without heart failure 10/06/2015   Insomnia with sleep apnea 10/06/2015   OSA on CPAP 10/06/2015   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 10/06/2015   CAD in native artery 10/06/2015   Chest pain at rest 07/18/2015    Class: Acute   Weakness 07/18/2015    Class: Acute   Insomnia 05/05/2015   Primary snoring 05/05/2015    Conditions to be addressed/monitored: HTN, DMII, and CKD Stage II  Care Plan : Social Work Plan fo Care  Updates made by Daneen Schick since 04/04/2021 12:00 AM  Completed 04/04/2021   Problem: Barriers to Treatment Resolved 04/04/2021     Goal: Work with pharmacy team to obtain [place] from pharmacy Completed 04/04/2021  Start Date: 04/04/2021  Priority: High  Note:   Current Barriers:  Chronic disease management support and education needs related to HTN, DM, and CKD Stage II   Difficulty obtaining prescribed medication Repatha  Social Worker Clinical Goal(s):  patient will work with SW to identify and address any acute and/or chronic care coordination needs related to the self health management of HTN, DM, and CKD Stage II   patient will work with pharmacy team to address needs related to Repatha SW Interventions:  Inter-disciplinary  care team collaboration (see longitudinal plan of care) Collaboration with Glendale Chard, MD regarding development and update of comprehensive plan of care as evidenced by provider attestation and co-signature Inbound call received from the patient who reports she has been unable to obtain medication Repatha from the pharmacy due to cost. Patient reports she was advised the cost would be $300 but in the past she did not have a co-pay amount Discussed plans for SW to request a member of the care management team to contact the patient to address medication needs Collaboration with care management team to advise of patients difficulty obtaining medication and to request follow up with the patient  Patient Goals/Self-Care Activities patient will:   -  Work with pharmacy team to obtain Repatha        Follow Up Plan:  No SW follow up planned. The patient will remain engaged with RN Care Manager and PharmD to address care management needs.  Daneen Schick, BSW, CDP Social Worker, Certified Dementia Practitioner [place] [facility] [place] [facility] [phone]

## 2021-04-04 NOTE — Patient Instructions (Addendum)
Social Worker Visit Information  Goals we discussed today:  Patient Goals/Self-Care Activities patient will:   -  Work with pharmacy team to obtain [employer] Provided: Verbal education about plan for pharmacy team to contact the patient provided by phone  Patient verbalizes understanding of instructions provided today and agrees to view in MyChart.   Follow Up Plan:  No SW follow up planned at this time. Please contact me as needed.   [name], BSW, CDP Social Worker, Certified Dementia Practitioner [facility] Care Management [phone]

## 2021-04-05 ENCOUNTER — Telehealth: Payer: Self-pay | Admitting: Cardiovascular Disease

## 2021-04-05 ENCOUNTER — Telehealth: Payer: Medicare PPO

## 2021-04-05 ENCOUNTER — Ambulatory Visit: Payer: Self-pay

## 2021-04-05 DIAGNOSIS — E1122 Type 2 diabetes mellitus with diabetic chronic kidney disease: Secondary | ICD-10-CM

## 2021-04-05 DIAGNOSIS — E7800 Pure hypercholesterolemia, unspecified: Secondary | ICD-10-CM

## 2021-04-05 DIAGNOSIS — I2510 Atherosclerotic heart disease of native coronary artery without angina pectoris: Secondary | ICD-10-CM

## 2021-04-05 DIAGNOSIS — E039 Hypothyroidism, unspecified: Secondary | ICD-10-CM

## 2021-04-05 DIAGNOSIS — N182 Chronic kidney disease, stage 2 (mild): Secondary | ICD-10-CM

## 2021-04-05 DIAGNOSIS — I10 Essential (primary) hypertension: Secondary | ICD-10-CM

## 2021-04-05 NOTE — Telephone Encounter (Signed)
Patient calling the office for samples of medication:   1.  What medication and dosage are you requesting samples for? Evolocumab (REPATHA SURECLICK) 140 MG/ML SOAJ  2.  Are you currently out of this medication? Yes, and is due for her next dose tomorrow   Delsa Sale, nurse care manager, states the patient was provided a number to renew the patient assistance, but she would like someone to follow up with her. She would also like a call back to update her on the outcome. Phone: [phone]

## 2021-04-05 NOTE — Telephone Encounter (Signed)
Lmom for rn to call me back to obtain the [employer] approval to take to the pharmacy to make the medication for free at the pharmacy

## 2021-04-06 ENCOUNTER — Ambulatory Visit: Payer: Medicare PPO

## 2021-04-06 DIAGNOSIS — N182 Chronic kidney disease, stage 2 (mild): Secondary | ICD-10-CM

## 2021-04-06 DIAGNOSIS — E1122 Type 2 diabetes mellitus with diabetic chronic kidney disease: Secondary | ICD-10-CM

## 2021-04-06 DIAGNOSIS — I2510 Atherosclerotic heart disease of native coronary artery without angina pectoris: Secondary | ICD-10-CM

## 2021-04-06 DIAGNOSIS — I10 Essential (primary) hypertension: Secondary | ICD-10-CM

## 2021-04-06 MED ORDER — REPATHA SURECLICK 140 MG/ML ~~LOC~~ SOAJ: 140.0000 mg | SUBCUTANEOUS | 3 refills | Status: DC

## 2021-04-06 NOTE — Telephone Encounter (Signed)
Called the pharmacy along with the pt to give them the [employer] grant information so that the pt can receive their medication for free. Pt voiced gratitude and understanding

## 2021-04-06 NOTE — Addendum Note (Signed)
Addended by: Allean Found on: 04/06/2021 04:07 PM   Modules accepted: Orders

## 2021-04-06 NOTE — Patient Instructions (Signed)
Visit Information  Thank you for taking time to visit with me today. Please don't hesitate to contact me if I can be of assistance to you before our next scheduled telephone appointment.  Following are the goals we discussed today:  (Copy and paste patient goals from clinical care plan here)  Our next appointment is by telephone on 06/02/21 at 12:05 PM  Please call the care guide team at [phone] if you need to cancel or reschedule your appointment.   If you are experiencing a Mental Health or Behavioral Health Crisis or need someone to talk to, please call 1-800-273-TALK (toll free, 24 hour hotline)   Patient verbalizes understanding of instructions and care plan provided today and agrees to view in MyChart. Active MyChart status confirmed with patient.    Delsa Sale, RN, BSN, CCM Care Management Coordinator [facility]  Direct Phone: [phone]

## 2021-04-06 NOTE — Chronic Care Management (AMB) (Signed)
Chronic Care Management    Social Work Note  04/06/2021 Name: [name] MRN: [id number] DOB: 1945/08/28  [name] is a 76 y.o. year old female who is a primary care patient of Glendale Chard, MD. The CCM team was consulted to assist the patient with chronic disease management and/or care coordination needs related to:  DM II, CKD II, HTN .   Engaged with patient by telephone for follow up visit in response to provider referral for social work chronic care management and care coordination services.   Consent to Services:  The patient was given information about Chronic Care Management services, agreed to services, and gave verbal consent prior to initiation of services.  Please see initial visit note for detailed documentation.   Patient agreed to services and consent obtained.   Assessment: Review of patient past medical history, allergies, medications, and health status, including review of relevant consultants reports was performed today as part of a comprehensive evaluation and provision of chronic care management and care coordination services.     SDOH (Social Determinants of Health) assessments and interventions performed:    Advanced Directives Status: Not addressed in this encounter.  CCM Care Plan  Allergies  Allergen Reactions   Atorvastatin Swelling and Other (See Comments)    Limbs swell   Clopidogrel Other (See Comments)    Caused bruising   Metoprolol Other (See Comments)    Slows heart rate too low   Metoprolol Tartrate Swelling and Other (See Comments)    Site of swelling not noted   Pregabalin Swelling and Other (See Comments)    Site of swelling not noted   Rosuvastatin Calcium Other (See Comments)    Unknown reaction    Outpatient Encounter Medications as of 04/06/2021  Medication Sig Note   albuterol (PROVENTIL HFA;VENTOLIN HFA) 108 (90 Base) MCG/ACT inhaler Inhale 2 puffs into the lungs every 6 (six) hours as needed for wheezing or  shortness of breath.    amLODipine (NORVASC) 5 MG tablet Take 5 mg by mouth at bedtime. 1/2 tab (Dr. Doylene Canard)    Apoaequorin (PREVAGEN PO) Take 1 tablet by mouth daily.    aspirin EC 81 MG tablet Take 81 mg by mouth daily. 01/31/2020: Patient would not disclose   b complex vitamins tablet Take 1 tablet by mouth daily with lunch.  01/31/2020: Patient would not disclose   Calcium Carb-Cholecalciferol (CALCIUM + D3 PO) Take 1 tablet by mouth at bedtime. 01/31/2020: Patient would not disclose   cholecalciferol (VITAMIN D) 25 MCG (1000 UNIT) tablet Take 1 tablet by mouth daily.  01/31/2020: Patient would not disclose   cilostazol (PLETAL) 100 MG tablet TAKE 1 TABLET EVERY DAY    Evolocumab (REPATHA SURECLICK) [id number] MG/ML SOAJ Inject 140 mg into the skin every 14 (fourteen) days.    furosemide (LASIX) 20 MG tablet TAKE 1 TABLET (20 MG TOTAL) BY MOUTH DAILY.    GNP MAGNESIUM OXIDE 250 MG TABS TAKE 1 TABLET EVERY DAY    hydrALAZINE (APRESOLINE) 10 MG tablet Take 1 tablet (10 mg total) by mouth every 8 (eight) hours. (Patient taking differently: Take 10 mg by mouth 3 (three) times daily with meals.) 08/09/2020: Per last visit with Dr. Doylene Canard take two tablets twice per day.    isosorbide mononitrate (IMDUR) 60 MG 24 hr tablet TAKE 1 TABLET EVERY DAY    levothyroxine (SYNTHROID) 88 MCG tablet TAKE 1 TABLET EVERY DAY BEFORE BREAKFAST    Magnesium 250 MG TABS Take 1 tablet (250 mg  total) by mouth at bedtime.    Multiple Vitamins-Minerals (MULTIVITAMIN WITH MINERALS) tablet Take 1 tablet by mouth daily with lunch.     nitroGLYCERIN (NITROSTAT) 0.4 MG SL tablet PLACE 1 TABLET UNDER THE TONGUE EVERY 5 MINUTES X 3 DOSES AS NEEDED FOR CHEST PAIN.    olmesartan (BENICAR) 40 MG tablet TAKE 1 TABLET EVERY DAY 08/09/2020: Patient reports taking Olmesartan 20 mg tablet daily per Dr. Doylene Canard.    potassium chloride (K-DUR) 10 MEQ tablet Take 10 mEq by mouth 2 (two) times daily with breakfast and lunch.     Semaglutide,0.25 or  0.5MG /DOS, (OZEMPIC, 0.25 OR 0.5 MG/DOSE,) 2 MG/1.5ML SOPN Inject 0.5 mg into the skin once a week.    No facility-administered encounter medications on file as of 04/06/2021.    Patient Active Problem List   Diagnosis Date Noted   Hyperlipidemia 10/30/2019   Statin myopathy 10/30/2019   Acute bronchitis 06/06/2018   Type 2 diabetes mellitus with stage 2 chronic kidney disease, without long-term current use of insulin ([place]) 02/17/2018   Chronic renal disease, stage II 02/17/2018   Benign hypertensive heart and renal disease 02/17/2018   Primary hypothyroidism 02/17/2018   Localized osteoarthritis of right knee 02/17/2018   Essential hypertension 06/12/2017   Acute coronary syndrome ([place]) 12/26/2016   Hypertensive heart disease without heart failure 10/06/2015   Insomnia with sleep apnea 10/06/2015   OSA on CPAP 10/06/2015   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 10/06/2015   CAD in native artery 10/06/2015   Chest pain at rest 07/18/2015    Class: Acute   Weakness 07/18/2015    Class: Acute   Insomnia 05/05/2015   Primary snoring 05/05/2015    Conditions to be addressed/monitored: HTN, DMII, and CKD Stage II  Care Plan : Social Work Plan fo Care  Updates made by Daneen Schick since 04/06/2021 12:00 AM  Completed 04/06/2021   Problem: Barriers to Treatment Resolved 04/06/2021     Goal: Work with pharmacy team to obtain [place] from pharmacy Completed 04/06/2021  Start Date: 04/04/2021  Priority: High  Note:   Current Barriers:  Chronic disease management support and education needs related to HTN, DM, and CKD Stage II   Difficulty obtaining prescribed medication Repatha  Social Worker Clinical Goal(s):  patient will work with SW to identify and address any acute and/or chronic care coordination needs related to the self health management of HTN, DM, and CKD Stage II   patient will work with pharmacy team to address needs related to [place] SW Interventions:  Inter-disciplinary  care team collaboration (see longitudinal plan of care) Collaboration with Glendale Chard, MD regarding development and update of comprehensive plan of care as evidenced by provider attestation and co-signature Inbound call received from the patient who reports she has been unable to obtain medication Repatha from the pharmacy due to cost and has yet to receive an feedback from the pharmacy team Performed chart review to note RN Care Manager contacted [facility] on 04/05/21 to request samples and CMA Allean Found attempted to return the RN Care Managers call. Unfortunately RN Care Manager is out of the office Assisted the patient in contacting [facility] with [place] call placed to patients pharmacy, [place] provided [facility] information and patient was advised her medication was ready for pick up and would be no cost Collaboration with primary care team including Dr. Baird Cancer, [place] Barb Merino, and PharmD Orlando Penner to advise of interventions and  outcome  Patient Goals/Self-Care Activities patient will:   -  Pick up [place] from pharmacy        Follow Up Plan:  No SW follow up planned at this time. The patient will remain active with RN Care Manager.      Daneen Schick, BSW, CDP Social Worker, Certified Dementia Practitioner [place] [facility] [place] [phone]

## 2021-04-06 NOTE — Chronic Care Management (AMB) (Signed)
Chronic Care Management   CCM RN Visit Note  04/05/2021 Name: [name] MRN: [id number] DOB: [birth year]  Subjective: [name] is a 76 y.o. year old female who is a primary care patient of Glendale Chard, MD. The care management team was consulted for assistance with disease management and care coordination needs.    Engaged with patient by telephone for follow up visit in response to provider referral for case management and/or care coordination services.   Consent to Services:  The patient was given information about Chronic Care Management services, agreed to services, and gave verbal consent prior to initiation of services.  Please see initial visit note for detailed documentation.   Patient agreed to services and verbal consent obtained.   Assessment: Review of patient past medical history, allergies, medications, health status, including review of consultants reports, laboratory and other test data, was performed as part of comprehensive evaluation and provision of chronic care management services.   SDOH (Social Determinants of Health) assessments and interventions performed:    CCM Care Plan  Allergies  Allergen Reactions   Atorvastatin Swelling and Other (See Comments)    Limbs swell   Clopidogrel Other (See Comments)    Caused bruising   Metoprolol Other (See Comments)    Slows heart rate too low   Metoprolol Tartrate Swelling and Other (See Comments)    Site of swelling not noted   Pregabalin Swelling and Other (See Comments)    Site of swelling not noted   Rosuvastatin Calcium Other (See Comments)    Unknown reaction    Outpatient Encounter Medications as of 04/05/2021  Medication Sig Note   albuterol (PROVENTIL HFA;VENTOLIN HFA) 108 (90 Base) MCG/ACT inhaler Inhale 2 puffs into the lungs every 6 (six) hours as needed for wheezing or shortness of breath.    amLODipine (NORVASC) 5 MG tablet Take 5 mg by mouth at bedtime. 1/2 tab (Dr. Doylene Canard)     Apoaequorin (PREVAGEN PO) Take 1 tablet by mouth daily.    aspirin EC 81 MG tablet Take 81 mg by mouth daily. 01/31/2020: Patient would not disclose   b complex vitamins tablet Take 1 tablet by mouth daily with lunch.  01/31/2020: Patient would not disclose   Calcium Carb-Cholecalciferol (CALCIUM + D3 PO) Take 1 tablet by mouth at bedtime. 01/31/2020: Patient would not disclose   cholecalciferol (VITAMIN D) 25 MCG (1000 UNIT) tablet Take 1 tablet by mouth daily.  01/31/2020: Patient would not disclose   cilostazol (PLETAL) 100 MG tablet TAKE 1 TABLET EVERY DAY    Evolocumab (REPATHA SURECLICK) 109 MG/ML SOAJ Inject 140 mg into the skin every 14 (fourteen) days.    furosemide (LASIX) 20 MG tablet TAKE 1 TABLET (20 MG TOTAL) BY MOUTH DAILY.    GNP MAGNESIUM OXIDE 250 MG TABS TAKE 1 TABLET EVERY DAY    hydrALAZINE (APRESOLINE) 10 MG tablet Take 1 tablet (10 mg total) by mouth every 8 (eight) hours. (Patient taking differently: Take 10 mg by mouth 3 (three) times daily with meals.) 08/09/2020: Per last visit with Dr. Doylene Canard take two tablets twice per day.    isosorbide mononitrate (IMDUR) 60 MG 24 hr tablet TAKE 1 TABLET EVERY DAY    levothyroxine (SYNTHROID) 88 MCG tablet TAKE 1 TABLET EVERY DAY BEFORE BREAKFAST    Magnesium 250 MG TABS Take 1 tablet (250 mg total) by mouth at bedtime.    Multiple Vitamins-Minerals (MULTIVITAMIN WITH MINERALS) tablet Take 1 tablet by mouth daily with lunch.  nitroGLYCERIN (NITROSTAT) 0.4 MG SL tablet PLACE 1 TABLET UNDER THE TONGUE EVERY 5 MINUTES X 3 DOSES AS NEEDED FOR CHEST PAIN.    olmesartan (BENICAR) 40 MG tablet TAKE 1 TABLET EVERY DAY 08/09/2020: Patient reports taking Olmesartan 20 mg tablet daily per Dr. Doylene Canard.    potassium chloride (K-DUR) 10 MEQ tablet Take 10 mEq by mouth 2 (two) times daily with breakfast and lunch.     Semaglutide,0.25 or 0.5MG /DOS, (OZEMPIC, 0.25 OR 0.5 MG/DOSE,) 2 MG/1.5ML SOPN Inject 0.5 mg into the skin once a week.     [DISCONTINUED] Evolocumab (REPATHA SURECLICK) 157 MG/ML SOAJ Inject 140 mg into the skin every 14 (fourteen) days.    No facility-administered encounter medications on file as of 04/05/2021.    Patient Active Problem List   Diagnosis Date Noted   Hyperlipidemia 10/30/2019   Statin myopathy 10/30/2019   Acute bronchitis 06/06/2018   Type 2 diabetes mellitus with stage 2 chronic kidney disease, without long-term current use of insulin ([place]) 02/17/2018   Chronic renal disease, stage II 02/17/2018   Benign hypertensive heart and renal disease 02/17/2018   Primary hypothyroidism 02/17/2018   Localized osteoarthritis of right knee 02/17/2018   Essential hypertension 06/12/2017   Acute coronary syndrome (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 10/06/2015   CAD in native artery 10/06/2015   Chest pain at rest 07/18/2015    Class: Acute   Weakness 07/18/2015    Class: Acute   Insomnia 05/05/2015   Primary snoring 05/05/2015    Conditions to be addressed/monitored: Tpe 2 DM with stage II CKD, Hypothyroidism, Hypercholesterolemia, HTN, CAD  Care Plan : RN Care Manager Plan of Care  Updates made by Lynne Logan, RN since 04/05/2021 12:00 AM     Problem: No Plan of Care established for management of chronic disease states (Type 2 DM with stage II CKD, Hypothyroidism, Hypercholesterolemia, HTN, CAD)   Priority: High     Long-Range Goal: Establishment of plan of care for management of chronic disease states (Type 2 DM with stage II CKD, Hypothyroidism, Hypercholesterolemia, HTN, CAD)   Start Date: 03/23/2021  Expected End Date: 03/23/2022  Recent Progress: On track  Priority: High  Note:   Current Barriers:  Knowledge Deficits related to plan of care for management of Type 2 DM with stage II CKD, Hypothyroidism, Hypercholesterolemia, HTN, CAD   Chronic Disease Management support and education needs related to Type 2 DM with stage II CKD, Hypothyroidism, Hypercholesterolemia, HTN, CAD   RNCM Clinical Goal(s):  Patient will verbalize basic understanding of  Type 2 DM with stage II CKD, Hypothyroidism, Hypercholesterolemia, HTN, CAD disease process and self health management plan as evidenced by patient will report having no disease exacerbations related to her chronic disease states  take all medications exactly as prescribed and will call provider for medication related questions as evidenced by patient will report having no missed doses of her prescribed medications demonstrate Ongoing health management independence as evidenced by patient will report 100% adherence to her prescribed treatment plan  continue to work with RN Care Manager to address care management and care coordination needs related to  Type 2 DM with stage II CKD, Hypothyroidism, Hypercholesterolemia, HTN, CAD as evidenced by adherence to CM Team Scheduled appointments demonstrate ongoing self health care management ability   as evidenced by    through collaboration with RN  Care manager, provider, and care team.   Interventions: 1:1 collaboration with primary care provider regarding development and update of comprehensive plan of care as evidenced by provider attestation and co-signature Inter-disciplinary care team collaboration (see longitudinal plan of care) Evaluation of current treatment plan related to  self management and patient's adherence to plan as established by provider   Chronic Kidney Disease Interventions:  (Status:  Condition stable.  Not addressed this visit.) Long Term Goal Assessed the Patient understanding of chronic kidney disease    Reviewed prescribed diet increase daily water intake to 64 oz daily unless otherwise directed  Discussed complications of poorly controlled blood pressure such as heart disease, stroke, circulatory complications, vision  complications, kidney impairment, sexual dysfunction    Provided education on kidney disease progression    Discussed plans with patient for ongoing care management follow up and provided patient with direct contact information for care management team Last practice recorded BP readings:  BP Readings from Last 3 Encounters:  02/08/21 112/70  10/03/20 120/60  05/26/20 120/60  Most recent eGFR/CrCl:  Lab Results  Component Value Date   EGFR 53 (L) 02/08/2021    No components found for: CRCL  Diabetes Interventions:  (Status:  Condition stable.  Not addressed this visit.) Long Term Goal Assessed patient's understanding of A1c goal:  <5.7 Provided education to patient about basic DM disease process Reviewed medications with patient and discussed importance of medication adherence Provided patient with written educational materials related to hypo and hyperglycemia and importance of correct treatment Advised patient, providing education and rationale, to check cbg daily before breakfast and record, calling PCP and or RN CM for findings outside established parameters Review of patient status, including review of consultants reports, relevant laboratory and other test results, and medications completed Discussed plans with patient for ongoing care management follow up and provided patient with direct contact information for care management team Lab Results  Component Value Date   HGBA1C 6.2 (H) 02/08/2021   Hyperlipidemia Interventions:  (Status:  New goal.) Long Term Goal Completed inbound call from patient regarding her Repatha Medication review performed; medication list updated in electronic medical record.  Provider established cholesterol goals reviewed Counseled on importance of regular laboratory monitoring as prescribed Provided HLD educational materials Reviewed role and benefits of statin for ASCVD risk reduction Reviewed importance of limiting foods high in cholesterol Reviewed  exercise goals and target of 150 minutes per week Determined patient is out of her Repatha medication with next dose due on 04/06/21 Placed a joint call with patient to [employer] to request a refill for this medication, spoke with rep who advised patient's PAP for Repatha expired on 03/25/21 Determined patient may contact a 3rd party grant sponsor working with [place] to complete a new application for Repatha PAP, provided phone number to patient [phone] Placed outbound call to [facility] lipid clinic, spoke with triage to advise patient is out of her Repatha with next dose due tomorrow, 04/06/21 and will need to reapply for Repatha PAP, advised the patient has been given the contact information in order to apply for PAP but may need additional assistance Requested samples of Repatha be provided to patient if available Provided the contact number for this RN CM and requested a call back for assistance with care coordination if needed Sent in basket message to PCP and embedded Pharm D Orlando Penner with notification of patient's status regarding Repatha Discussed plans with patient for ongoing care management follow up and  provided patient with direct contact information for care management team  Patient Goals/Self-Care Activities: Take all medications as prescribed Attend all scheduled provider appointments Call pharmacy for medication refills 3-7 days in advance of running out of medications Perform all self care activities independently  Perform IADL's (shopping, preparing meals, housekeeping, managing finances) independently Call provider office for new concerns or questions  drink 6 to 8 glasses of water each day fill half of plate with vegetables manage portion size Contact [place] at the phone number provided to complete a new PAP application for financial assistance for Rapatha Follow up with the lipid clinic to obtain Rapatha samples   Follow Up Plan:  Telephone  follow up appointment with care management team member scheduled for:  06/02/21     Plan:Telephone follow up appointment with care management team member scheduled for:  06/02/21  Barb Merino, RN, BSN, CCM Care Management Coordinator [place] [facility]  Direct Phone: [phone]

## 2021-04-06 NOTE — Patient Instructions (Addendum)
Social Worker Visit Information  Goals we discussed today:  Patient Goals/Self-Care Activities patient will:   -  Pick up Repatha from pharmacy  Materials Provided: Verbal education about  [place] provided by phone  Patient verbalizes understanding of instructions and care plan provided today and agrees to view in MyChart. Active MyChart status confirmed with patient.    Follow Up Plan:  No follow up planned at this time. Please contact me as needed.   [name], BSW, CDP Social Worker, Certified Dementia Practitioner [facility] Care Management [phone]

## 2021-04-25 DIAGNOSIS — I10 Essential (primary) hypertension: Secondary | ICD-10-CM | POA: Diagnosis not present

## 2021-04-25 DIAGNOSIS — N182 Chronic kidney disease, stage 2 (mild): Secondary | ICD-10-CM

## 2021-04-25 DIAGNOSIS — E7800 Pure hypercholesterolemia, unspecified: Secondary | ICD-10-CM | POA: Diagnosis not present

## 2021-04-25 DIAGNOSIS — I2510 Atherosclerotic heart disease of native coronary artery without angina pectoris: Secondary | ICD-10-CM | POA: Diagnosis not present

## 2021-04-25 DIAGNOSIS — E039 Hypothyroidism, unspecified: Secondary | ICD-10-CM

## 2021-04-25 DIAGNOSIS — E1122 Type 2 diabetes mellitus with diabetic chronic kidney disease: Secondary | ICD-10-CM

## 2021-05-03 DIAGNOSIS — E034 Atrophy of thyroid (acquired): Secondary | ICD-10-CM | POA: Diagnosis not present

## 2021-05-03 DIAGNOSIS — I2510 Atherosclerotic heart disease of native coronary artery without angina pectoris: Secondary | ICD-10-CM | POA: Diagnosis not present

## 2021-05-03 DIAGNOSIS — M25561 Pain in right knee: Secondary | ICD-10-CM | POA: Diagnosis not present

## 2021-05-03 DIAGNOSIS — R072 Precordial pain: Secondary | ICD-10-CM | POA: Diagnosis not present

## 2021-05-03 DIAGNOSIS — E7849 Other hyperlipidemia: Secondary | ICD-10-CM | POA: Diagnosis not present

## 2021-05-03 DIAGNOSIS — E119 Type 2 diabetes mellitus without complications: Secondary | ICD-10-CM | POA: Diagnosis not present

## 2021-05-05 ENCOUNTER — Telehealth: Payer: Self-pay

## 2021-05-05 NOTE — Chronic Care Management (AMB) (Signed)
Chronic Care Management Pharmacy Assistant   Name: [name]  MRN: [id number] DOB: 09-Feb-1946  Reason for Encounter: Disease State/ Diabetes   Recent office visits:  04-06-2021 Daneen Schick (CCM)  04-05-2021 Little, Claudette Stapler, RN (CCM)  04-04-2021 Daneen Schick (CCM)  03-23-2021 Little, Claudette Stapler, RN (CCM)  Recent consult visits:  None  Hospital visits:  None in previous 6 months  Medications: Outpatient Encounter Medications as of 05/05/2021  Medication Sig Note   albuterol (PROVENTIL HFA;VENTOLIN HFA) 108 (90 Base) MCG/ACT inhaler Inhale 2 puffs into the lungs every 6 (six) hours as needed for wheezing or shortness of breath.    amLODipine (NORVASC) 5 MG tablet Take 5 mg by mouth at bedtime. 1/2 tab (Dr. Doylene Canard)    Apoaequorin (PREVAGEN PO) Take 1 tablet by mouth daily.    aspirin EC 81 MG tablet Take 81 mg by mouth daily. 01/31/2020: Patient would not disclose   b complex vitamins tablet Take 1 tablet by mouth daily with lunch.  01/31/2020: Patient would not disclose   Calcium Carb-Cholecalciferol (CALCIUM + D3 PO) Take 1 tablet by mouth at bedtime. 01/31/2020: Patient would not disclose   cholecalciferol (VITAMIN D) 25 MCG (1000 UNIT) tablet Take 1 tablet by mouth daily.  01/31/2020: Patient would not disclose   cilostazol (PLETAL) 100 MG tablet TAKE 1 TABLET EVERY DAY    Evolocumab (REPATHA SURECLICK) [id number] MG/ML SOAJ Inject 140 mg into the skin every 14 (fourteen) days.    furosemide (LASIX) 20 MG tablet TAKE 1 TABLET (20 MG TOTAL) BY MOUTH DAILY.    GNP MAGNESIUM OXIDE 250 MG TABS TAKE 1 TABLET EVERY DAY    hydrALAZINE (APRESOLINE) 10 MG tablet Take 1 tablet (10 mg total) by mouth every 8 (eight) hours. (Patient taking differently: Take 10 mg by mouth 3 (three) times daily with meals.) 08/09/2020: Per last visit with Dr. Doylene Canard take two tablets twice per day.    isosorbide mononitrate (IMDUR) 60 MG 24 hr tablet TAKE 1 TABLET EVERY DAY    levothyroxine (SYNTHROID)  88 MCG tablet TAKE 1 TABLET EVERY DAY BEFORE BREAKFAST    Magnesium 250 MG TABS Take 1 tablet (250 mg total) by mouth at bedtime.    Multiple Vitamins-Minerals (MULTIVITAMIN WITH MINERALS) tablet Take 1 tablet by mouth daily with lunch.     nitroGLYCERIN (NITROSTAT) 0.4 MG SL tablet PLACE 1 TABLET UNDER THE TONGUE EVERY 5 MINUTES X 3 DOSES AS NEEDED FOR CHEST PAIN.    olmesartan (BENICAR) 40 MG tablet TAKE 1 TABLET EVERY DAY 08/09/2020: Patient reports taking Olmesartan 20 mg tablet daily per Dr. Doylene Canard.    potassium chloride (K-DUR) 10 MEQ tablet Take 10 mEq by mouth 2 (two) times daily with breakfast and lunch.     Semaglutide,0.25 or 0.5MG /DOS, (OZEMPIC, 0.25 OR 0.5 MG/DOSE,) 2 MG/1.5ML SOPN Inject 0.5 mg into the skin once a week.    No facility-administered encounter medications on file as of 05/05/2021.  Recent Relevant Labs: Lab Results  Component Value Date/Time   HGBA1C 6.2 (H) 02/08/2021 12:07 PM   HGBA1C 6.2 (H) 10/03/2020 12:18 PM   MICROALBUR 10 05/26/2020 02:40 PM   MICROALBUR 10 05/27/2019 11:30 AM    Kidney Function Lab Results  Component Value Date/Time   CREATININE 1.09 (H) 02/08/2021 12:07 PM   CREATININE 1.17 (H) 10/03/2020 12:18 PM   GFRNONAA 52 (L) 01/31/2020 10:44 AM   GFRAA 55 (L) 09/30/2019 10:48 AM    Current antihyperglycemic regimen:  Ozempic 0.5 mg  weekly  What recent interventions/DTPs have been made to improve glycemic control:  Educated on A1c and blood sugar goals; Counseled to check feet daily and get yearly eye exams  Have there been any recent hospitalizations or ED visits since last visit with CPP? No  Patient denies hypoglycemic symptoms  Patient denies hyperglycemic symptoms  How often are you checking your blood sugar? once daily  What are your blood sugars ranging?  Fasting: 98, 106, 82, 99 Before meals: None After meals: None Bedtime: None  During the week, how often does your blood glucose drop below 70? Never Are you checking  your feet daily/regularly?   Adherence Review: Is the patient currently on a STATIN medication? No Is the patient currently on ACE/ARB medication? Yes Does the patient have >5 day gap between last estimated fill dates? No  Care Gaps: Shingrix overdue PNA Vac overdue Yearly foot exam overdue Yearly ophthalmology exam overdue Covid booster overdue AWV 05-31-2021  Star Rating Drugs: Olmesartan 40 mg- Last filled 03-13-2021 90 DS Ozempic 0.5 mg- Patient assistance  [place] Pharmacist Assistant [phone]

## 2021-05-31 ENCOUNTER — Ambulatory Visit (INDEPENDENT_AMBULATORY_CARE_PROVIDER_SITE_OTHER): Payer: Medicare PPO | Admitting: Internal Medicine

## 2021-05-31 ENCOUNTER — Other Ambulatory Visit: Payer: Self-pay

## 2021-05-31 ENCOUNTER — Encounter: Payer: Self-pay | Admitting: Internal Medicine

## 2021-05-31 ENCOUNTER — Ambulatory Visit (INDEPENDENT_AMBULATORY_CARE_PROVIDER_SITE_OTHER): Payer: Medicare PPO

## 2021-05-31 VITALS — BP 108/58 | HR 81 | Temp 98.0°F | Ht 62.0 in | Wt 141.3 lb

## 2021-05-31 VITALS — BP 108/58 | HR 81 | Temp 98.0°F | Ht 62.0 in | Wt 141.4 lb

## 2021-05-31 DIAGNOSIS — I2510 Atherosclerotic heart disease of native coronary artery without angina pectoris: Secondary | ICD-10-CM | POA: Diagnosis not present

## 2021-05-31 DIAGNOSIS — Z0000 Encounter for general adult medical examination without abnormal findings: Secondary | ICD-10-CM | POA: Diagnosis not present

## 2021-05-31 DIAGNOSIS — I739 Peripheral vascular disease, unspecified: Secondary | ICD-10-CM | POA: Insufficient documentation

## 2021-05-31 DIAGNOSIS — E1122 Type 2 diabetes mellitus with diabetic chronic kidney disease: Secondary | ICD-10-CM | POA: Diagnosis not present

## 2021-05-31 DIAGNOSIS — E039 Hypothyroidism, unspecified: Secondary | ICD-10-CM

## 2021-05-31 DIAGNOSIS — I1310 Hypertensive heart and chronic kidney disease without heart failure, with stage 1 through stage 4 chronic kidney disease, or unspecified chronic kidney disease: Secondary | ICD-10-CM | POA: Diagnosis not present

## 2021-05-31 DIAGNOSIS — N182 Chronic kidney disease, stage 2 (mild): Secondary | ICD-10-CM

## 2021-05-31 DIAGNOSIS — Z6825 Body mass index (BMI) 25.0-25.9, adult: Secondary | ICD-10-CM

## 2021-05-31 NOTE — Progress Notes (Signed)
?[name],acting as a Education administrator for [name], MD.,have documented all relevant documentation on the behalf of [name], MD,as directed by  [name], MD while in the presence of [name], MD.  ?This visit occurred during the SARS-CoV-2 public health emergency.  Safety protocols were in place, including screening questions prior to the visit, additional usage of staff PPE, and extensive cleaning of exam room while observing appropriate contact time as indicated for disinfecting solutions. ? ?Subjective:  ?  ? Patient ID: [name] , female    DOB: 1945/06/24 , 76 y.o.   MRN: [id number] ? ? ?Chief Complaint  ?Patient presents with  ? Diabetes  ? Hypertension  ? ? ?HPI ? ?The patient is here today for a follow-up on her diabetes and blood pressure. She reports compliance with meds. She denies headaches, chest pain and shortness of breath.  ? ?She was also seen by [facility] Advisor earlier today for AWV.  ? ?Diabetes ?She presents for her follow-up diabetic visit. She has type 2 diabetes mellitus. Pertinent negatives for hypoglycemia include no headaches. Pertinent negatives for diabetes include no blurred vision. There are no hypoglycemic complications. Diabetic complications include heart disease and nephropathy. Risk factors for coronary artery disease include diabetes mellitus, dyslipidemia, hypertension and post-menopausal. She is following a diabetic diet. She participates in exercise intermittently. An ACE inhibitor/angiotensin II receptor blocker is being taken. Eye exam is current.  ?Hypertension ?This is a chronic problem. The current episode started more than 1 year ago. The problem has been gradually improving since onset. The problem is controlled. Pertinent negatives include no blurred vision or headaches. The current treatment provides moderate improvement. 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   ? ?Past Medical History:  ?Diagnosis Date   ? Benign hypertensive renal disease   ? CKD stage 2 due to type 2 diabetes mellitus ([place])   ? Hypertension   ? Hypothyroidism   ? Obesity   ? OSA on CPAP   ? Pure hypercholesterolemia   ? PVD (peripheral vascular disease) ([place])   ? Vitamin D deficiency   ?  ? ?Family History  ?Problem Relation Age of Onset  ? Alzheimer's disease Mother   ? Aneurysm Father   ? Cancer Brother   ? ? ? ?Current Outpatient Medications:  ?  albuterol (PROVENTIL HFA;VENTOLIN HFA) 108 (90 Base) MCG/ACT inhaler, Inhale 2 puffs into the lungs every 6 (six) hours as needed for wheezing or shortness of breath., Disp: 1 Inhaler, Rfl: 2 ?  amLODipine (NORVASC) 5 MG tablet, Take 5 mg by mouth at bedtime. 1/2 tab (Dr. Doylene Canard), Disp: , Rfl:  ?  Apoaequorin (PREVAGEN PO), Take 1 tablet by mouth daily., Disp: , Rfl:  ?  aspirin EC 81 MG tablet, Take 81 mg by mouth daily., Disp: , Rfl:  ?  b complex vitamins tablet, Take 1 tablet by mouth daily with lunch. , Disp: , Rfl:  ?  Calcium Carb-Cholecalciferol (CALCIUM + D3 PO), Take 1 tablet by mouth at bedtime., Disp: , Rfl:  ?  cholecalciferol (VITAMIN D) 25 MCG (1000 UNIT) tablet, Take 1 tablet by mouth daily. , Disp: , Rfl:  ?  cilostazol (PLETAL) 100 MG tablet, TAKE 1 TABLET EVERY DAY, Disp: 90 tablet, Rfl: 1 ?  Evolocumab (REPATHA SURECLICK) 824 MG/ML SOAJ, Inject 140 mg into the skin every 14 (fourteen) days., Disp: 6 mL, Rfl: 3 ?  furosemide (LASIX) 20 MG tablet, TAKE 1 TABLET (20 MG  TOTAL) BY MOUTH DAILY., Disp: 90 tablet, Rfl: 2 ?  GNP MAGNESIUM OXIDE 250 MG TABS, TAKE 1 TABLET EVERY DAY, Disp: 90 tablet, Rfl: 3 ?  hydrALAZINE (APRESOLINE) 10 MG tablet, Take 1 tablet (10 mg total) by mouth every 8 (eight) hours. (Patient taking differently: Take 10 mg by mouth 3 (three) times daily with meals.), Disp: 90 tablet, Rfl: 3 ?  isosorbide mononitrate (IMDUR) 60 MG 24 hr tablet, TAKE 1 TABLET EVERY DAY, Disp: 90 tablet, Rfl: 2 ?  levothyroxine (SYNTHROID) 88 MCG tablet, TAKE 1 TABLET EVERY DAY BEFORE  BREAKFAST, Disp: 90 tablet, Rfl: 1 ?  Magnesium 250 MG TABS, Take 1 tablet (250 mg total) by mouth at bedtime., Disp: 90 tablet, Rfl: 2 ?  Multiple Vitamins-Minerals (MULTIVITAMIN WITH MINERALS) tablet, Take 1 tablet by mouth daily with lunch. , Disp: , Rfl:  ?  nitroGLYCERIN (NITROSTAT) 0.4 MG SL tablet, PLACE 1 TABLET UNDER THE TONGUE EVERY 5 MINUTES X 3 DOSES AS NEEDED FOR CHEST PAIN., Disp: 100 tablet, Rfl: 1 ?  olmesartan (BENICAR) 40 MG tablet, TAKE 1 TABLET EVERY DAY, Disp: 90 tablet, Rfl: 2 ?  potassium chloride (K-DUR) 10 MEQ tablet, Take 10 mEq by mouth 2 (two) times daily with breakfast and lunch. , Disp: , Rfl: 3 ?  Semaglutide,0.25 or 0.5MG/DOS, (OZEMPIC, 0.25 OR 0.5 MG/DOSE,) 2 MG/1.5ML SOPN, Inject 0.5 mg into the skin once a week., Disp: 4.5 mL, Rfl: 1  ? ?Allergies  ?Allergen Reactions  ? Atorvastatin Swelling and Other (See Comments)  ?  Limbs swell  ? Clopidogrel Other (See Comments)  ?  Caused bruising  ? Metoprolol Other (See Comments)  ?  Slows heart rate too low  ? Metoprolol Tartrate Swelling and Other (See Comments)  ?  Site of swelling not noted  ? Pregabalin Swelling and Other (See Comments)  ?  Site of swelling not noted  ? Rosuvastatin Calcium Other (See Comments)  ?  Unknown reaction  ?  ? ?Review of Systems  ?Constitutional: Negative.   ?Eyes:  Negative for blurred vision.  ?Respiratory: Negative.    ?Cardiovascular: Negative.   ?Neurological: Negative.  Negative for headaches.  ?Psychiatric/Behavioral: Negative.     ? ?Today's Vitals  ? 05/31/21 1129  ?BP: (!) 108/58  ?Pulse: 81  ?Temp: 98 ?F (36.7 ?C)  ?Weight: 141 lb 5 oz (64.1 kg)  ?Height: 5' 2"  (1.575 m)  ? ?Body mass index is 25.85 kg/m?.  ?Wt Readings from Last 3 Encounters:  ?05/31/21 141 lb 5 oz (64.1 kg)  ?05/31/21 141 lb 6.4 oz (64.1 kg)  ?02/08/21 143 lb 9.6 oz (65.1 kg)  ?  ? ?Objective:  ?Physical Exam ?Vitals and nursing note reviewed.  ?Constitutional:   ?   Appearance: Normal appearance.  ?HENT:  ?   Head:  Normocephalic and atraumatic.  ?   Left Ear: Impacted cerumen: masked.  ?   Nose:  ?   Comments: Masked  ?   Mouth/Throat:  ?   Comments: Masked  ?Cardiovascular:  ?   Rate and Rhythm: Normal rate and regular rhythm.  ?   Heart sounds: Normal heart sounds.  ?Pulmonary:  ?   Effort: Pulmonary effort is normal.  ?   Breath sounds: Normal breath sounds.  ?Musculoskeletal:  ?   Cervical back: Normal range of motion.  ?Skin: ?   General: Skin is warm.  ?Neurological:  ?   General: No focal deficit present.  ?   Mental Status: She is alert.  ?  Psychiatric:     ?   Mood and Affect: Mood normal.     ?   Behavior: Behavior normal.  ?  ? ?   ?Assessment And Plan:  ?   ?1. Type 2 diabetes mellitus with stage 2 chronic kidney disease, without long-term current use of insulin ([place]) ?Comments: Chronic, I will check labs as below. I will adjust meds as needed.  ?- CMP14+EGFR ?- Hemoglobin A1c ? ?2. Hypertensive heart and renal disease with renal failure, stage 1 through stage 4 or unspecified chronic kidney disease, without heart failure ?Comments: Chronic, controlled. No med changes. She is encouraged to c/w current meds and follow low sodium diet.  ? ?3. CAD in native artery ?Comments: Chronic, encouraged to c/w ASA and Repatha. She has been statin intolerant.  ? ?4. Primary hypothyroidism ?Comments: I will check TSH and adjust meds as needed.  ?- TSH ? ?5. BMI 25.0-25.9,adult ?Comments: She is encouraged to aim for at least 150 minutes of exercise per week.  ?  ?Patient was given opportunity to ask questions. Patient verbalized understanding of the plan and was able to repeat key elements of the plan. All questions were answered to their satisfaction.  ? ?I, [name], MD, have reviewed all documentation for this visit. The documentation on 05/31/21 for the exam, diagnosis, procedures, and orders are all accurate and complete.  ? ?IF YOU HAVE BEEN REFERRED TO A SPECIALIST, IT MAY TAKE 1-2 WEEKS TO SCHEDULE/PROCESS THE  REFERRAL. IF YOU HAVE NOT HEARD FROM US/SPECIALIST IN TWO WEEKS, PLEASE GIVE [place] A CALL AT [phone].  ? ?THE PATIENT IS ENCOURAGED TO PRACTICE SOCIAL DISTANCING DUE TO THE COVID-19 PANDEMIC.   ?

## 2021-05-31 NOTE — Patient Instructions (Signed)
[name] , Thank you for taking time to come for your Medicare Wellness Visit. I appreciate your ongoing commitment to your health goals. Please review the following plan we discussed and let me know if I can assist you in the future.   Screening recommendations/referrals: Colonoscopy: not required Mammogram: completed 03/07/2021, due 03/08/2022 Bone Density: completed 08/02/2014 Recommended yearly ophthalmology/optometry visit for glaucoma screening and checkup Recommended yearly dental visit for hygiene and checkup  Vaccinations: Influenza vaccine: completed 02/08/2021, due next flu season Pneumococcal vaccine: completed 02/23/2021 Tdap vaccine: completed 11/10/2012, due 11/11/2022 Shingles vaccine: due   Covid-19: 08/12/2020, 02/05/2020, 06/10/2019, 05/13/2019  Advanced directives: Advance directive discussed with you today. I have provided a copy for you to complete at home and have notarized. Once this is complete please bring a copy in to our office so we can scan it into your chart.  Conditions/risks identified: none  Next appointment: Follow up in one year for your annual wellness visit    Preventive Care 65 Years and Older, Female Preventive care refers to lifestyle choices and visits with your health care provider that can promote health and wellness. What does preventive care include? A yearly physical exam. This is also called an annual well check. Dental exams once or twice a year. Routine eye exams. Ask your health care provider how often you should have your eyes checked. Personal lifestyle choices, including: Daily care of your teeth and gums. Regular physical activity. Eating a healthy diet. Avoiding tobacco and drug use. Limiting alcohol use. Practicing safe sex. Taking low-dose aspirin every day. Taking vitamin and mineral supplements as recommended by your health care provider. What happens during an annual well check? The services and screenings done by your  health care provider during your annual well check will depend on your age, overall health, lifestyle risk factors, and family history of disease. Counseling  Your health care provider may ask you questions about your: Alcohol use. Tobacco use. Drug use. Emotional well-being. Home and relationship well-being. Sexual activity. Eating habits. History of falls. Memory and ability to understand (cognition). Work and work Statistician. Reproductive health. Screening  You may have the following tests or measurements: Height, weight, and BMI. Blood pressure. Lipid and cholesterol levels. These may be checked every 5 years, or more frequently if you are over 89 years old. Skin check. Lung cancer screening. You may have this screening every year starting at age 41 if you have a 30-pack-year history of smoking and currently smoke or have quit within the past 15 years. Fecal occult blood test (FOBT) of the stool. You may have this test every year starting at age 65. Flexible sigmoidoscopy or colonoscopy. You may have a sigmoidoscopy every 5 years or a colonoscopy every 10 years starting at age 41. Hepatitis C blood test. Hepatitis B blood test. Sexually transmitted disease (STD) testing. Diabetes screening. This is done by checking your blood sugar (glucose) after you have not eaten for a while (fasting). You may have this done every 1-3 years. Bone density scan. This is done to screen for osteoporosis. You may have this done starting at age 5. Mammogram. This may be done every 1-2 years. Talk to your health care provider about how often you should have regular mammograms. Talk with your health care provider about your test results, treatment options, and if necessary, the need for more tests. Vaccines  Your health care provider may recommend certain vaccines, such as: Influenza vaccine. This is recommended every year. Tetanus, diphtheria, and acellular  pertussis (Tdap, Td) vaccine. You may  need a Td booster every 10 years. Zoster vaccine. You may need this after age 30. Pneumococcal 13-valent conjugate (PCV13) vaccine. One dose is recommended after age 28. Pneumococcal polysaccharide (PPSV23) vaccine. One dose is recommended after age 25. Talk to your health care provider about which screenings and vaccines you need and how often you need them. This information is not intended to replace advice given to you by your health care provider. Make sure you discuss any questions you have with your health care provider. Document Released: 04/08/2015 Document Revised: 11/30/2015 Document Reviewed: 01/11/2015 Elsevier Interactive Patient Education  2017 [place] Prevention in the Home Falls can cause injuries. They can happen to people of all ages. There are many things you can do to make your home safe and to help prevent falls. What can I do on the outside of my home? Regularly fix the edges of walkways and driveways and fix any cracks. Remove anything that might make you trip as you walk through a door, such as a raised step or threshold. Trim any bushes or trees on the path to your home. Use bright outdoor lighting. Clear any walking paths of anything that might make someone trip, such as rocks or tools. Regularly check to see if handrails are loose or broken. Make sure that both sides of any steps have handrails. Any raised decks and porches should have guardrails on the edges. Have any leaves, snow, or ice cleared regularly. Use sand or salt on walking paths during winter. Clean up any spills in your garage right away. This includes oil or grease spills. What can I do in the bathroom? Use night lights. Install grab bars by the toilet and in the tub and shower. Do not use towel bars as grab bars. Use non-skid mats or decals in the tub or shower. If you need to sit down in the shower, use a plastic, non-slip stool. Keep the floor dry. Clean up any water that spills on  the floor as soon as it happens. Remove soap buildup in the tub or shower regularly. Attach bath mats securely with double-sided non-slip rug tape. Do not have throw rugs and other things on the floor that can make you trip. What can I do in the bedroom? Use night lights. Make sure that you have a light by your bed that is easy to reach. Do not use any sheets or blankets that are too big for your bed. They should not hang down onto the floor. Have a firm chair that has side arms. You can use this for support while you get dressed. Do not have throw rugs and other things on the floor that can make you trip. What can I do in the kitchen? Clean up any spills right away. Avoid walking on wet floors. Keep items that you use a lot in easy-to-reach places. If you need to reach something above you, use a strong step stool that has a grab bar. Keep electrical cords out of the way. Do not use floor polish or wax that makes floors slippery. If you must use wax, use non-skid floor wax. Do not have throw rugs and other things on the floor that can make you trip. What can I do with my stairs? Do not leave any items on the stairs. Make sure that there are handrails on both sides of the stairs and use them. Fix handrails that are broken or loose. Make sure that handrails  are as long as the stairways. Check any carpeting to make sure that it is firmly attached to the stairs. Fix any carpet that is loose or worn. Avoid having throw rugs at the top or bottom of the stairs. If you do have throw rugs, attach them to the floor with carpet tape. Make sure that you have a light switch at the top of the stairs and the bottom of the stairs. If you do not have them, ask someone to add them for you. What else can I do to help prevent falls? Wear shoes that: Do not have high heels. Have rubber bottoms. Are comfortable and fit you well. Are closed at the toe. Do not wear sandals. If you use a stepladder: Make sure  that it is fully opened. Do not climb a closed stepladder. Make sure that both sides of the stepladder are locked into place. Ask someone to hold it for you, if possible. Clearly mark and make sure that you can see: Any grab bars or handrails. First and last steps. Where the edge of each step is. Use tools that help you move around (mobility aids) if they are needed. These include: Canes. Walkers. Scooters. Crutches. Turn on the lights when you go into a dark area. Replace any light bulbs as soon as they burn out. Set up your furniture so you have a clear path. Avoid moving your furniture around. If any of your floors are uneven, fix them. If there are any pets around you, be aware of where they are. Review your medicines with your doctor. Some medicines can make you feel dizzy. This can increase your chance of falling. Ask your doctor what other things that you can do to help prevent falls. This information is not intended to replace advice given to you by your health care provider. Make sure you discuss any questions you have with your health care provider. Document Released: 01/06/2009 Document Revised: 08/18/2015 Document Reviewed: 04/16/2014 Elsevier Interactive Patient Education  2017 [employer].

## 2021-05-31 NOTE — Patient Instructions (Signed)

## 2021-05-31 NOTE — Progress Notes (Signed)
This visit occurred during the SARS-CoV-2 public health emergency.  Safety protocols were in place, including screening questions prior to the visit, additional usage of staff PPE, and extensive cleaning of exam room while observing appropriate contact time as indicated for disinfecting solutions.  Subjective:   [name] is a 76 y.o. female who presents for Medicare Annual (Subsequent) preventive examination.  Review of Systems     Cardiac Risk Factors include: advanced age (>42men, >23 women);diabetes mellitus;hypertension     Objective:    Today's Vitals   05/31/21 1116  BP: (!) 108/58  Pulse: 81  Temp: 98 F (36.7 C)  TempSrc: Oral  SpO2: 98%  Weight: 141 lb 6.4 oz (64.1 kg)  Height: 5\' 2"  (1.575 m)   Body mass index is 25.86 kg/m.  Advanced Directives 05/31/2021 05/26/2020 05/27/2019 05/21/2018 12/24/2017 12/15/2017 06/12/2017  Does Patient Have a Medical Advance Directive? No No No No No No No  Would patient like information on creating a medical advance directive? Yes (MAU/Ambulatory/Procedural Areas - Information given) No - Patient declined Yes (MAU/Ambulatory/Procedural Areas - Information given) Yes (MAU/Ambulatory/Procedural Areas - Information given) No - Patient declined No - Patient declined No - Patient declined    Current Medications (verified) Outpatient Encounter Medications as of 05/31/2021  Medication Sig   albuterol (PROVENTIL HFA;VENTOLIN HFA) 108 (90 Base) MCG/ACT inhaler Inhale 2 puffs into the lungs every 6 (six) hours as needed for wheezing or shortness of breath.   amLODipine (NORVASC) 5 MG tablet Take 5 mg by mouth at bedtime. 1/2 tab (Dr. Doylene Canard)   Apoaequorin (PREVAGEN PO) Take 1 tablet by mouth daily.   aspirin EC 81 MG tablet Take 81 mg by mouth daily.   b complex vitamins tablet Take 1 tablet by mouth daily with lunch.    Calcium Carb-Cholecalciferol (CALCIUM + D3 PO) Take 1 tablet by mouth at bedtime.   cholecalciferol (VITAMIN D) 25 MCG  (1000 UNIT) tablet Take 1 tablet by mouth daily.    cilostazol (PLETAL) 100 MG tablet TAKE 1 TABLET EVERY DAY   Evolocumab (REPATHA SURECLICK) XX123456 MG/ML SOAJ Inject 140 mg into the skin every 14 (fourteen) days.   furosemide (LASIX) 20 MG tablet TAKE 1 TABLET (20 MG TOTAL) BY MOUTH DAILY.   GNP MAGNESIUM OXIDE 250 MG TABS TAKE 1 TABLET EVERY DAY   hydrALAZINE (APRESOLINE) 10 MG tablet Take 1 tablet (10 mg total) by mouth every 8 (eight) hours. (Patient taking differently: Take 10 mg by mouth 3 (three) times daily with meals.)   isosorbide mononitrate (IMDUR) 60 MG 24 hr tablet TAKE 1 TABLET EVERY DAY   levothyroxine (SYNTHROID) 88 MCG tablet TAKE 1 TABLET EVERY DAY BEFORE BREAKFAST   Magnesium 250 MG TABS Take 1 tablet (250 mg total) by mouth at bedtime.   Multiple Vitamins-Minerals (MULTIVITAMIN WITH MINERALS) tablet Take 1 tablet by mouth daily with lunch.    nitroGLYCERIN (NITROSTAT) 0.4 MG SL tablet PLACE 1 TABLET UNDER THE TONGUE EVERY 5 MINUTES X 3 DOSES AS NEEDED FOR CHEST PAIN.   olmesartan (BENICAR) 40 MG tablet TAKE 1 TABLET EVERY DAY   potassium chloride (K-DUR) 10 MEQ tablet Take 10 mEq by mouth 2 (two) times daily with breakfast and lunch.    Semaglutide,0.25 or 0.5MG /DOS, (OZEMPIC, 0.25 OR 0.5 MG/DOSE,) 2 MG/1.5ML SOPN Inject 0.5 mg into the skin once a week.   No facility-administered encounter medications on file as of 05/31/2021.    Allergies (verified) Atorvastatin, Clopidogrel, Metoprolol, Metoprolol tartrate, Pregabalin, and Rosuvastatin calcium  History: Past Medical History:  Diagnosis Date   Benign hypertensive renal disease    CKD stage 2 due to type 2 diabetes mellitus (HCC)    Hypertension    Hypothyroidism    Obesity    OSA on CPAP    Pure hypercholesterolemia    PVD (peripheral vascular disease) (HCC)    Vitamin D deficiency    Past Surgical History:  Procedure Laterality Date   ABDOMINAL HYSTERECTOMY     BREAST CYST EXCISION Right over 20 yrs   broken  leg and displaced ankle     DILATION AND CURETTAGE OF UTERUS     LEFT HEART CATH AND CORONARY ANGIOGRAPHY N/A 06/13/2017   Procedure: LEFT HEART CATH AND CORONARY ANGIOGRAPHY;  Surgeon: Dixie Dials, MD;  Location: [facility];  Service: Cardiovascular;  Laterality: N/A;   right and left rotator cuff     THYROIDECTOMY     Family History  Problem Relation Age of Onset   Alzheimer's disease Mother    Aneurysm Father    Cancer Brother    Social History   Socioeconomic History   Marital status: Married    Spouse name: Not on file   Number of children: 3   Years of education: Not on file   Highest education level: Not on file  Occupational History   Occupation: retired  Tobacco Use   Smoking status: Never   Smokeless tobacco: Never  Vaping Use   Vaping Use: Never used  Substance and Sexual Activity   Alcohol use: No    Alcohol/week: 0.0 standard drinks    Comment: occasional wine   Drug use: No   Sexual activity: Not Currently  Other Topics Concern   Not on file  Social History Narrative   Drinks 1-2 cups daily of coffee daily.   Social Determinants of Health   Financial Resource Strain: Low Risk    Difficulty of Paying Living Expenses: Not hard at all  Food Insecurity: No Food Insecurity   Worried About Charity fundraiser in the Last Year: Never true   [place] in the Last Year: Never true  Transportation Needs: No Transportation Needs   Lack of Transportation (Medical): No   Lack of Transportation (Non-Medical): No  Physical Activity: Inactive   Days of Exercise per Week: 0 days   Minutes of Exercise per Session: 0 min  Stress: No Stress Concern Present   Feeling of Stress : Not at all  Social Connections: Not on file    Tobacco Counseling Counseling given: Not Answered   Clinical Intake:  Pre-visit preparation completed: Yes        Nutritional Status: BMI 25 -29 Overweight Nutritional Risks: None Diabetes: No  How often do you need  to have someone help you when you read instructions, pamphlets, or other written materials from your doctor or pharmacy?: 1 - Never What is the last grade level you completed in school?: some college  Diabetic?yes Nutrition Risk Assessment:  Has the patient had any N/V/D within the last 2 months?  No  Does the patient have any non-healing wounds?  No  Has the patient had any unintentional weight loss or weight gain?  No   Diabetes:  Is the patient diabetic?  Yes  If diabetic, was a CBG obtained today?  No  Did the patient bring in their glucometer from home?  No  How often do you monitor your CBG's? daily.   Financial Strains and Diabetes Management:  Are you having any financial strains with the device, your supplies or your medication? No .  Does the patient want to be seen by Chronic Care Management for management of their diabetes?  No  Would the patient like to be referred to a Nutritionist or for Diabetic Management?  No   Diabetic Exams:  Diabetic Eye Exam: Overdue for diabetic eye exam. Pt has been advised about the importance in completing this exam. Patient advised to call and schedule an eye exam. Diabetic Foot Exam: Overdue, Pt has been advised about the importance in completing this exam. Pt is scheduled for diabetic foot exam on next appointment.   Interpreter Needed?: No  Information entered by :: NAllen LPN   Activities of Daily Living In your present state of health, do you have any difficulty performing the following activities: 05/31/2021  Hearing? N  Vision? Y  Comment sometimes  Difficulty concentrating or making decisions? N  Walking or climbing stairs? N  Dressing or bathing? N  Doing errands, shopping? N  Preparing Food and eating ? N  Using the Toilet? N  In the past six months, have you accidently leaked urine? N  Do you have problems with loss of bowel control? N  Managing your Medications? N  Managing your Finances? N  Housekeeping or  managing your Housekeeping? N  Some recent data might be hidden    Patient Care Team: [name], MD as PCP - General (Internal Medicine) Chalmers Guest, MD as Consulting Physician (Ophthalmology) Clarene Duke, [name], RN as Case Manager Harlan Stains, [facility] (Pharmacist)  Indicate any recent Medical Services you may have received from other than [facility] providers in the past year (date may be approximate).     Assessment:   This is a routine wellness examination for [name].  Hearing/Vision screen Vision Screening - Comments:: Regular eye exams, Dr. Harlon Flor  Dietary issues and exercise activities discussed: Current Exercise Habits: The patient does not participate in regular exercise at present   Goals Addressed             This Visit's Progress    Patient Stated       05/31/2021, wants to lose 4 pounds       Depression Screen PHQ 2/9 Scores 05/31/2021 05/26/2020 09/30/2019 05/27/2019 02/12/2019 11/13/2018 09/23/2018  PHQ - 2 Score 0 0 0 0 0 0 0  PHQ- 9 Score - - 0 1 4 4  -    Fall Risk Fall Risk  05/31/2021 05/26/2020 05/27/2019 09/23/2018 05/21/2018  Falls in the past year? 0 0 0 0 1  Number falls in past yr: - - - - 0  Comment - - - - slipped in bath tub  Injury with Fall? - - - - 0  Risk for fall due to : Medication side effect Medication side effect Medication side effect - Medication side effect  Follow up Falls evaluation completed;Education provided;Falls prevention discussed Falls evaluation completed;Education provided;Falls prevention discussed Falls evaluation completed;Education provided;Falls prevention discussed - Education provided;Falls prevention discussed    FALL RISK PREVENTION PERTAINING TO THE HOME:  Any stairs in or around the home? Yes  If so, are there any without handrails? No  Home free of loose throw rugs in walkways, pet beds, electrical cords, etc? Yes  Adequate lighting in your home to reduce risk of falls? Yes   ASSISTIVE DEVICES UTILIZED TO PREVENT  FALLS:  Life alert? No  Use of a cane, walker or w/c? No  Grab bars in the bathroom?  No  Shower chair or bench in shower? No  Elevated toilet seat or a handicapped toilet? Yes   TIMED UP AND GO:  Was the test performed? No .    Gait steady and fast without use of assistive device  Cognitive Function:     6CIT Screen 05/31/2021 05/26/2020 05/27/2019 05/21/2018  What Year? 0 points 0 points 0 points 0 points  What month? 0 points 0 points 0 points 0 points  What time? 0 points 0 points 0 points 3 points  Count back from 20 0 points 0 points 0 points 0 points  Months in reverse 2 points 0 points 0 points 0 points  Repeat phrase 2 points 2 points 0 points 0 points  Total Score 4 2 0 3    Immunizations Immunization History  Administered Date(s) Administered   Fluad Quad(high Dose 65+) 02/08/2021   Influenza, High Dose Seasonal PF 03/30/2014, 01/31/2015, 04/04/2016, 12/18/2016, 11/13/2018, 01/04/2020   Influenza-Unspecified 01/24/2013, 12/21/2017   Moderna SARS-COV2 Booster Vaccination 02/05/2020   Moderna Sars-Covid-2 Vaccination 05/13/2019, 06/10/2019, 08/12/2020   Pneumococcal Polysaccharide-23 12/27/2016   Zoster, Live 02/03/2013    TDAP status: Up to date  Flu Vaccine status: Up to date  Pneumococcal vaccine status: Up to date  Covid-19 vaccine status: Completed vaccines  Qualifies for Shingles Vaccine? Yes   Zostavax completed Yes   Shingrix Completed?:  no  Screening Tests Health Maintenance  Topic Date Due   Zoster Vaccines- Shingrix (1 of 2) Never done   Pneumonia Vaccine 4065+ Years old (2 - PCV) 12/27/2017   FOOT EXAM  11/13/2019   OPHTHALMOLOGY EXAM  02/27/2020   COVID-19 Vaccine (4 - Booster for Moderna series) 10/07/2020   HEMOGLOBIN A1C  08/08/2021   TETANUS/TDAP  11/11/2022   INFLUENZA VACCINE  Completed   DEXA SCAN  Completed   Hepatitis C Screening  Completed   HPV VACCINES  Aged Out   COLONOSCOPY (Pts [id number] Insurance coverage will need to be  confirmed)  Discontinued    Health Maintenance  Health Maintenance Due  Topic Date Due   Zoster Vaccines- Shingrix (1 of 2) Never done   Pneumonia Vaccine 8765+ Years old (2 - PCV) 12/27/2017   FOOT EXAM  11/13/2019   OPHTHALMOLOGY EXAM  02/27/2020   COVID-19 Vaccine (4 - Booster for Moderna series) 10/07/2020    Colorectal cancer screening: No longer required.   Mammogram status: Completed 03/07/2021. Repeat every year  Bone Density status: Completed 08/02/2014.   Lung Cancer Screening: (Low Dose CT Chest recommended if Age 43-80 years, 30 pack-year currently smoking OR have quit w/in 15years.) does not qualify.   Lung Cancer Screening Referral: no  Additional Screening:  Hepatitis C Screening: does qualify; Completed 04/03/2012  Vision Screening: Recommended annual ophthalmology exams for early detection of glaucoma and other disorders of the eye. Is the patient up to date with their annual eye exam?  No  Who is the provider or what is the name of the office in which the patient attends annual eye exams? Dr. Harlon FlorWhitaker If pt is not established with a provider, would they like to be referred to a provider to establish care? No .   Dental Screening: Recommended annual dental exams for proper oral hygiene  Community Resource Referral / Chronic Care Management: CRR required this visit?  No   CCM required this visit?  No      Plan:     I have personally reviewed and noted the following in the patients chart:  Medical and social history Use of alcohol, tobacco or illicit drugs  Current medications and supplements including opioid prescriptions.  Functional ability and status Nutritional status Physical activity Advanced directives List of other physicians Hospitalizations, surgeries, and ER visits in previous 12 months Vitals Screenings to include cognitive, depression, and falls Referrals and appointments  In addition, I have reviewed and discussed with patient  certain preventive protocols, quality metrics, and best practice recommendations. A written personalized care plan for preventive services as well as general preventive health recommendations were provided to patient.     [name], LPN   [phone]   Nurse Notes: none

## 2021-06-02 ENCOUNTER — Telehealth: Payer: Medicare PPO

## 2021-06-02 ENCOUNTER — Ambulatory Visit (INDEPENDENT_AMBULATORY_CARE_PROVIDER_SITE_OTHER): Payer: Medicare PPO

## 2021-06-02 DIAGNOSIS — I10 Essential (primary) hypertension: Secondary | ICD-10-CM

## 2021-06-02 DIAGNOSIS — E7800 Pure hypercholesterolemia, unspecified: Secondary | ICD-10-CM

## 2021-06-02 DIAGNOSIS — E1122 Type 2 diabetes mellitus with diabetic chronic kidney disease: Secondary | ICD-10-CM

## 2021-06-02 DIAGNOSIS — I2510 Atherosclerotic heart disease of native coronary artery without angina pectoris: Secondary | ICD-10-CM

## 2021-06-02 DIAGNOSIS — E039 Hypothyroidism, unspecified: Secondary | ICD-10-CM

## 2021-06-02 NOTE — Chronic Care Management (AMB) (Signed)
Chronic Care Management   CCM RN Visit Note  06/02/2021 Name: [name] MRN: [id number] DOB: 07/22/1945  Subjective: [name] is a 76 y.o. year old female who is a primary care patient of Glendale Chard, MD. The care management team was consulted for assistance with disease management and care coordination needs.    Engaged with patient by telephone for follow up visit in response to provider referral for case management and/or care coordination services.   Consent to Services:  The patient was given information about Chronic Care Management services, agreed to services, and gave verbal consent prior to initiation of services.  Please see initial visit note for detailed documentation.   Patient agreed to services and verbal consent obtained.   Assessment: Review of patient past medical history, allergies, medications, health status, including review of consultants reports, laboratory and other test data, was performed as part of comprehensive evaluation and provision of chronic care management services.   SDOH (Social Determinants of Health) assessments and interventions performed:  Yes, no acute challenges   CCM Care Plan  Allergies  Allergen Reactions   Atorvastatin Swelling and Other (See Comments)    Limbs swell   Clopidogrel Other (See Comments)    Caused bruising   Metoprolol Other (See Comments)    Slows heart rate too low   Metoprolol Tartrate Swelling and Other (See Comments)    Site of swelling not noted   Pregabalin Swelling and Other (See Comments)    Site of swelling not noted   Rosuvastatin Calcium Other (See Comments)    Unknown reaction    Outpatient Encounter Medications as of 06/02/2021  Medication Sig Note   albuterol (PROVENTIL HFA;VENTOLIN HFA) 108 (90 Base) MCG/ACT inhaler Inhale 2 puffs into the lungs every 6 (six) hours as needed for wheezing or shortness of breath.    amLODipine (NORVASC) 5 MG tablet Take 5 mg by mouth at bedtime.  1/2 tab (Dr. Doylene Canard)    Apoaequorin (PREVAGEN PO) Take 1 tablet by mouth daily.    aspirin EC 81 MG tablet Take 81 mg by mouth daily. 01/31/2020: Patient would not disclose   b complex vitamins tablet Take 1 tablet by mouth daily with lunch.  01/31/2020: Patient would not disclose   Calcium Carb-Cholecalciferol (CALCIUM + D3 PO) Take 1 tablet by mouth at bedtime. 01/31/2020: Patient would not disclose   cholecalciferol (VITAMIN D) 25 MCG (1000 UNIT) tablet Take 1 tablet by mouth daily.  01/31/2020: Patient would not disclose   cilostazol (PLETAL) 100 MG tablet TAKE 1 TABLET EVERY DAY    Evolocumab (REPATHA SURECLICK) 502 MG/ML SOAJ Inject 140 mg into the skin every 14 (fourteen) days.    furosemide (LASIX) 20 MG tablet TAKE 1 TABLET (20 MG TOTAL) BY MOUTH DAILY.    GNP MAGNESIUM OXIDE 250 MG TABS TAKE 1 TABLET EVERY DAY    hydrALAZINE (APRESOLINE) 10 MG tablet Take 1 tablet (10 mg total) by mouth every 8 (eight) hours. (Patient taking differently: Take 10 mg by mouth 3 (three) times daily with meals.) 08/09/2020: Per last visit with Dr. Doylene Canard take two tablets twice per day.    isosorbide mononitrate (IMDUR) 60 MG 24 hr tablet TAKE 1 TABLET EVERY DAY    levothyroxine (SYNTHROID) 88 MCG tablet TAKE 1 TABLET EVERY DAY BEFORE BREAKFAST    Magnesium 250 MG TABS Take 1 tablet (250 mg total) by mouth at bedtime.    Multiple Vitamins-Minerals (MULTIVITAMIN WITH MINERALS) tablet Take 1 tablet by mouth daily  with lunch.     nitroGLYCERIN (NITROSTAT) 0.4 MG SL tablet PLACE 1 TABLET UNDER THE TONGUE EVERY 5 MINUTES X 3 DOSES AS NEEDED FOR CHEST PAIN.    olmesartan (BENICAR) 40 MG tablet TAKE 1 TABLET EVERY DAY 08/09/2020: Patient reports taking Olmesartan 20 mg tablet daily per Dr. Doylene Canard.    potassium chloride (K-DUR) 10 MEQ tablet Take 10 mEq by mouth 2 (two) times daily with breakfast and lunch.     Semaglutide,0.25 or 0.5MG /DOS, (OZEMPIC, 0.25 OR 0.5 MG/DOSE,) 2 MG/1.5ML SOPN Inject 0.5 mg into the skin once  a week.    No facility-administered encounter medications on file as of 06/02/2021.    Patient Active Problem List   Diagnosis Date Noted   Peripheral vascular disease, unspecified ([place]) 05/31/2021   Hyperlipidemia 10/30/2019   Statin myopathy 10/30/2019   Acute bronchitis 06/06/2018   Type 2 diabetes mellitus with stage 2 chronic kidney disease, without long-term current use of insulin ([place]) 02/17/2018   Chronic renal disease, stage II 02/17/2018   Benign hypertensive heart and renal disease 02/17/2018   Primary hypothyroidism 02/17/2018   Localized osteoarthritis of right knee 02/17/2018   Essential hypertension 06/12/2017   Acute coronary syndrome ([place]) 12/26/2016   Hypertensive heart disease without heart failure 10/06/2015   Insomnia with sleep apnea 10/06/2015   OSA on CPAP 10/06/2015   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 10/06/2015   CAD in native artery 10/06/2015   Chest pain at rest 07/18/2015    Class: Acute   Weakness 07/18/2015    Class: Acute   Insomnia 05/05/2015   Primary snoring 05/05/2015    Conditions to be addressed/monitored: Tpe 2 DM with stage II CKD, Hypothyroidism, Hypercholesterolemia, HTN, CAD  Care Plan : RN Care Manager Plan of Care  Updates made by Lynne Logan, RN since 06/02/2021 12:00 AM     Problem: No Plan of Care established for management of chronic disease states (Type 2 DM with stage II CKD, Hypothyroidism, Hypercholesterolemia, HTN, CAD)   Priority: High     Long-Range Goal: Establishment of plan of care for management of chronic disease states (Type 2 DM with stage II CKD, Hypothyroidism, Hypercholesterolemia, HTN, CAD)   Start Date: 03/23/2021  Expected End Date: 03/23/2022  Recent Progress: On track  Priority: High  Note:   Current Barriers:  Knowledge Deficits related to plan of care for management of Type 2 DM with stage II CKD, Hypothyroidism, Hypercholesterolemia, HTN, CAD  Chronic Disease Management support and education  needs related to Type 2 DM with stage II CKD, Hypothyroidism, Hypercholesterolemia, HTN, CAD   RNCM Clinical Goal(s):  Patient will verbalize basic understanding of  Type 2 DM with stage II CKD, Hypothyroidism, Hypercholesterolemia, HTN, CAD disease process and self health management plan as evidenced by patient will report having no disease exacerbations related to her chronic disease states  take all medications exactly as prescribed and will call provider for medication related questions as evidenced by patient will report having no missed doses of her prescribed medications demonstrate Ongoing health management independence as evidenced by patient will report 100% adherence to her prescribed treatment plan  continue to work with RN Care Manager to address care management and care coordination needs related to  Type 2 DM with stage II CKD, Hypothyroidism, Hypercholesterolemia, HTN, CAD as evidenced by adherence to CM Team Scheduled appointments demonstrate ongoing self health care management ability   as evidenced by    through collaboration with RN Care manager, provider, and care team.  Interventions: 1:1 collaboration with primary care provider regarding development and update of comprehensive plan of care as evidenced by provider attestation and co-signature Inter-disciplinary care team collaboration (see longitudinal plan of care) Evaluation of current treatment plan related to  self management and patient's adherence to plan as established by provider   Chronic Kidney Disease Interventions:  (Status:  Condition stable.  Not addressed this visit.) Long Term Goal Assessed the Patient understanding of chronic kidney disease    Reviewed prescribed diet increase daily water intake to 64 oz daily unless otherwise directed  Discussed complications of poorly controlled blood pressure such as heart disease, stroke, circulatory complications, vision complications, kidney impairment, sexual  dysfunction    Provided education on kidney disease progression    Discussed plans with patient for ongoing care management follow up and provided patient with direct contact information for care management team Last practice recorded BP readings:  BP Readings from Last 3 Encounters:  02/08/21 112/70  10/03/20 120/60  05/26/20 120/60  Most recent eGFR/CrCl:  Lab Results  Component Value Date   EGFR 53 (L) 02/08/2021    No components found for: CRCL  Diabetes Interventions:  (Status:  Condition stable.  Not addressed this visit.) Long Term Goal Assessed patient's understanding of A1c goal:  <5.7 Provided education to patient about basic DM disease process Reviewed medications with patient and discussed importance of medication adherence Provided patient with written educational materials related to hypo and hyperglycemia and importance of correct treatment Advised patient, providing education and rationale, to check cbg daily before breakfast and record, calling PCP and or RN CM for findings outside established parameters Review of patient status, including review of consultants reports, relevant laboratory and other test results, and medications completed Discussed plans with patient for ongoing care management follow up and provided patient with direct contact information for care management team Lab Results  Component Value Date   HGBA1C 6.2 (H) 02/08/2021  Hyperlipidemia Interventions:  (Status:  Goal on track:  Yes.) Long Term Goal Provider established cholesterol goals reviewed Determined patient continues to self administer her [place] without difficulty Reinforced importance of limiting foods high in cholesterol Reinforced exercise goals and target of 150 minutes per week Mailed printed educational material related to Chair Exercises; The Skinny on Fats; Cooking to Lower Cholesterol   Discussed plans with patient for ongoing care management follow up and provided patient with  direct contact information for care management team Lipid Panel     Component Value Date/Time   CHOL 156 10/03/2020 1218   TRIG 44 10/03/2020 1218   HDL 83 10/03/2020 1218   CHOLHDL 1.9 10/03/2020 1218   CHOLHDL 3.4 07/14/2019 0433   VLDL 7 07/14/2019 0433   LDLCALC 63 10/03/2020 1218   LABVLDL 10 10/03/2020 1218   Patient Goals/Self-Care Activities: Take all medications as prescribed Attend all scheduled provider appointments Call pharmacy for medication refills 3-7 days in advance of running out of medications Perform all self care activities independently  Perform IADL's (shopping, preparing meals, housekeeping, managing finances) independently Call provider office for new concerns or questions  drink 6 to 8 glasses of water each day fill half of plate with vegetables manage portion size adhere to prescribed diet: low Saturated/low Trans fat develop an exercise routine  Follow Up Plan:  Telephone follow up appointment with care management team member scheduled for:  06/23/21     Barb Merino, RN, BSN, CCM Care Management Coordinator [facility]  Direct Phone: [phone]

## 2021-06-02 NOTE — Progress Notes (Signed)
This encounter was created in error - please disregard.

## 2021-06-02 NOTE — Patient Instructions (Signed)
Visit Information ? ?Thank you for taking time to visit with me today. Please don't hesitate to contact me if I can be of assistance to you before our next scheduled telephone appointment. ? ?Following are the goals we discussed today:  ?(Copy and paste patient goals from clinical care plan here) ? ?Our next appointment is by telephone on 06/23/21 at 12:45 PM  ? ?Please call the care guide team at [phone] if you need to cancel or reschedule your appointment.  ? ?If you are experiencing a Mental Health or [place] or need someone to talk to, please call [phone]-TALK (toll free, 24 hour hotline)  ? ?Patient verbalizes understanding of instructions and care plan provided today and agrees to view in [place]. Active MyChart status confirmed with patient.   ? ?Barb Merino, RN, BSN, CCM ?Care Management Coordinator ?[facility]  ?Direct Phone: [phone] ? ? ?

## 2021-06-05 ENCOUNTER — Other Ambulatory Visit: Payer: Self-pay

## 2021-06-05 MED ORDER — ZOSTER VAC RECOMB ADJUVANTED 50 MCG/0.5ML IM SUSR: 0.5000 mL | Freq: Once | INTRAMUSCULAR | 0 refills | Status: AC

## 2021-06-05 MED ORDER — ZOSTER VAC RECOMB ADJUVANTED 50 MCG/0.5ML IM SUSR
0.5000 mL | Freq: Once | INTRAMUSCULAR | 0 refills | Status: DC
Start: 2021-06-05 — End: 2021-06-05

## 2021-06-08 ENCOUNTER — Other Ambulatory Visit: Payer: Self-pay | Admitting: Internal Medicine

## 2021-06-20 ENCOUNTER — Other Ambulatory Visit: Payer: Self-pay

## 2021-06-20 ENCOUNTER — Ambulatory Visit (INDEPENDENT_AMBULATORY_CARE_PROVIDER_SITE_OTHER): Payer: Medicare PPO

## 2021-06-20 DIAGNOSIS — N182 Chronic kidney disease, stage 2 (mild): Secondary | ICD-10-CM | POA: Diagnosis not present

## 2021-06-20 DIAGNOSIS — E1122 Type 2 diabetes mellitus with diabetic chronic kidney disease: Secondary | ICD-10-CM | POA: Diagnosis not present

## 2021-06-20 DIAGNOSIS — Z23 Encounter for immunization: Secondary | ICD-10-CM | POA: Diagnosis not present

## 2021-06-20 DIAGNOSIS — E039 Hypothyroidism, unspecified: Secondary | ICD-10-CM | POA: Diagnosis not present

## 2021-06-20 NOTE — Progress Notes (Signed)
? ?  Covid-19 Vaccination Clinic ? ?Name:  [name]    ?MRN: [id number] ?DOB: 1945-03-27 ? ?06/20/2021 ? ?[name] was observed post Covid-19 immunization for 15 minutes without incident. She was provided with Vaccine Information Sheet and instruction to access the V-Safe system.  ? ?[name] was instructed to call 911 with any severe reactions post vaccine: ?Difficulty breathing  ?Swelling of face and throat  ?A fast heartbeat  ?A bad rash all over body  ?Dizziness and weakness  ? ?Immunizations Administered   ? ? Name Date Dose VIS Date Route  ? Moderna Covid-19 vaccine Bivalent Booster 06/20/2021  3:13 PM 0.5 mL 11/05/2020 Intramuscular  ? Manufacturer: Moderna  ? Lot: [id number]  ? NDC: 80777-282-05  ? ?  ? ? ?

## 2021-06-21 LAB — HEMOGLOBIN A1C
Est. average glucose Bld gHb Est-mCnc: 123 mg/dL
Hgb A1c MFr Bld: 5.9 % — ABNORMAL HIGH (ref 4.8–5.6)

## 2021-06-21 LAB — TSH: TSH: 0.664 u[IU]/mL (ref 0.450–4.500)

## 2021-06-21 LAB — CMP14+EGFR
ALT: 25 IU/L (ref 0–32)
AST: 45 IU/L — ABNORMAL HIGH (ref 0–40)
Albumin/Globulin Ratio: 2.3 — ABNORMAL HIGH (ref 1.2–2.2)
Albumin: 4.5 g/dL (ref 3.7–4.7)
Alkaline Phosphatase: 57 IU/L (ref 44–121)
BUN/Creatinine Ratio: 14 (ref 12–28)
BUN: 16 mg/dL (ref 8–27)
Bilirubin Total: 0.4 mg/dL (ref 0.0–1.2)
CO2: 22 mmol/L (ref 20–29)
Calcium: 10.2 mg/dL (ref 8.7–10.3)
Chloride: 103 mmol/L (ref 96–106)
Creatinine, Ser: 1.18 mg/dL — ABNORMAL HIGH (ref 0.57–1.00)
Globulin, Total: 2 g/dL (ref 1.5–4.5)
Glucose: 80 mg/dL (ref 70–99)
Potassium: 4.6 mmol/L (ref 3.5–5.2)
Sodium: 138 mmol/L (ref 134–144)
Total Protein: 6.5 g/dL (ref 6.0–8.5)
eGFR: 48 mL/min/{1.73_m2} — ABNORMAL LOW (ref >59)

## 2021-06-23 ENCOUNTER — Telehealth: Payer: Medicare PPO

## 2021-06-23 ENCOUNTER — Other Ambulatory Visit: Payer: Self-pay

## 2021-06-23 ENCOUNTER — Telehealth: Payer: Self-pay

## 2021-06-23 DIAGNOSIS — N182 Chronic kidney disease, stage 2 (mild): Secondary | ICD-10-CM | POA: Diagnosis not present

## 2021-06-23 DIAGNOSIS — E1122 Type 2 diabetes mellitus with diabetic chronic kidney disease: Secondary | ICD-10-CM

## 2021-06-23 DIAGNOSIS — E7800 Pure hypercholesterolemia, unspecified: Secondary | ICD-10-CM | POA: Diagnosis not present

## 2021-06-23 NOTE — Telephone Encounter (Signed)
-----   Message from [name], MD sent at 06/22/2021  5:59 PM EDT ----- ?Your kidney function is stable. Be sure to stay well hydrated.  One of your liver enzymes is slightly elevated. Your hba1c is 5.9, this has improved.  ? ?Your thyroid function has changed. It is slightly lower than goal of 1.0. how have you been taking your thyroid meds? You should be taking once daily Sunday through Saturday. Are you taking any new supplements?  ? ?RS ? ? ?

## 2021-06-23 NOTE — Telephone Encounter (Signed)
Patient said she has been taking levothyroxine Monday - Saturday. The only supplement that she takes is Prevogen and she takes it at noon or later.  The patient said she didn't notice the change on the directions.  ?

## 2021-07-13 LAB — HM DIABETES EYE EXAM

## 2021-07-26 ENCOUNTER — Telehealth: Payer: Self-pay

## 2021-07-26 NOTE — Chronic Care Management (AMB) (Addendum)
? ? ?Chronic Care Management ?Pharmacy Assistant  ? ?Name: [name]  MRN: [id number] DOB: 28-Sep-1945 ? ?Reason for Encounter: Disease State/ Diabetes ? ?Recent office visits:  ?06-20-2021 [name] booster given ? ?06-02-2021 [name], Catherine Stapler, RN (CCM) ? ?05-31-2021 Catherine Chard, MD. Creatinine= 1.18, eGFR= 48. Albumin/globulin= 2.3. AST= 45. A1C= 5.9. ? ?05-31-2021 Catherine Simmering, LPN. [name]. ? ?Recent consult visits:  ?None ? ?Hospital visits:  ?None in previous 6 months ? ?Medications: ?Outpatient Encounter Medications as of 07/26/2021  ?Medication Sig Note  ? albuterol (PROVENTIL HFA;VENTOLIN HFA) 108 (90 Base) MCG/ACT inhaler Inhale 2 puffs into the lungs every 6 (six) hours as needed for wheezing or shortness of breath.   ? amLODipine (NORVASC) 5 MG tablet Take 5 mg by mouth at bedtime. 1/2 tab (Dr. Doylene Canard)   ? Apoaequorin (PREVAGEN PO) Take 1 tablet by mouth daily.   ? aspirin EC 81 MG tablet Take 81 mg by mouth daily. 01/31/2020: Patient would not disclose  ? b complex vitamins tablet Take 1 tablet by mouth daily with lunch.  01/31/2020: Patient would not disclose  ? Calcium Carb-Cholecalciferol (CALCIUM + D3 PO) Take 1 tablet by mouth at bedtime. 01/31/2020: Patient would not disclose  ? cholecalciferol (VITAMIN D) 25 MCG (1000 UNIT) tablet Take 1 tablet by mouth daily.  01/31/2020: Patient would not disclose  ? cilostazol (PLETAL) 100 MG tablet TAKE 1 TABLET EVERY DAY   ? Evolocumab (REPATHA SURECLICK) 366 MG/ML SOAJ Inject 140 mg into the skin every 14 (fourteen) days.   ? furosemide (LASIX) 20 MG tablet TAKE 1 TABLET (20 MG TOTAL) BY MOUTH DAILY.   ? GNP MAGNESIUM OXIDE 250 MG TABS TAKE 1 TABLET EVERY DAY   ? hydrALAZINE (APRESOLINE) 10 MG tablet Take 1 tablet (10 mg total) by mouth every 8 (eight) hours. (Patient taking differently: Take 10 mg by mouth 3 (three) times daily with meals.) 08/09/2020: Per last [name] with Dr. Doylene Canard take two tablets twice per day.   ?  isosorbide mononitrate (IMDUR) 60 MG 24 hr tablet TAKE 1 TABLET EVERY DAY   ? levothyroxine (SYNTHROID) 88 MCG tablet TAKE 1 TABLET EVERY DAY BEFORE BREAKFAST (Patient taking differently: Take 1 tablet by mouth Monday - Friday)   ? Magnesium 250 MG TABS Take 1 tablet (250 mg total) by mouth at bedtime.   ? Multiple Vitamins-Minerals (MULTIVITAMIN WITH MINERALS) tablet Take 1 tablet by mouth daily with lunch.    ? nitroGLYCERIN (NITROSTAT) 0.4 MG SL tablet PLACE 1 TABLET UNDER THE TONGUE EVERY 5 MINUTES X 3 DOSES AS NEEDED FOR CHEST PAIN.   ? olmesartan (BENICAR) 40 MG tablet TAKE 1 TABLET EVERY DAY 08/09/2020: Patient reports taking Olmesartan 20 mg tablet daily per Dr. Doylene Canard.   ? potassium chloride (K-DUR) 10 MEQ tablet Take 10 mEq by mouth 2 (two) times daily with breakfast and lunch.    ? Semaglutide,0.25 or 0.5MG/DOS, (OZEMPIC, 0.25 OR 0.5 MG/DOSE,) 2 MG/1.5ML SOPN Inject 0.5 mg into the skin once a week.   ? ?No facility-administered encounter medications on file as of 07/26/2021.  ?Recent Relevant Labs: ?Lab Results  ?Component Value Date/Time  ? HGBA1C 5.9 (H) 06/20/2021 03:44 PM  ? HGBA1C 6.2 (H) 02/08/2021 12:07 PM  ? MICROALBUR 10 05/26/2020 02:40 PM  ? MICROALBUR 10 05/27/2019 11:30 AM  ?  ?Kidney Function ?Lab Results  ?Component Value Date/Time  ? CREATININE 1.18 (H) 06/20/2021 03:44 PM  ? CREATININE 1.09 (H) 02/08/2021 12:07 PM  ? [id number]  52 (L) 01/31/2020 10:44 AM  ? GFRAA 55 (L) 09/30/2019 10:48 AM  ? ? ?Current antihyperglycemic regimen:  ?Ozempic 0.5 mg weekly ? ?What recent interventions/DTPs have been made to improve glycemic control:  ?Educated on A1c and blood sugar goals; ?Counseled to check feet daily and get yearly eye exams ? ?Have there been any recent hospitalizations or ED visits since last [name] with CPP? No ? ?Patient denies hypoglycemic symptoms ? ?Patient denies hyperglycemic symptoms ? ?How often are you checking your blood sugar? once daily ? ?What are your blood sugars ranging?   ?Fasting: 99, 104, 121, 85 ?Before meals: None ?After meals: None ?Bedtime: None ? ?During the week, how often does your blood glucose drop below 70? Never ? ?Are you checking your feet daily/regularly?  ? ?Adherence Review: ?Is the patient currently on a STATIN medication? No ?Is the patient currently on ACE/ARB medication? Yes ?Does the patient have >5 day gap between last estimated fill dates? Yes ? ?07-27-2021: Patient is on medication adherence list for non-compliance for Olmesartan 20 mg. Patient's medication was filled on 03-13-2021 for 90 DS. Patient stated [facility] made a mistake and sent her extra bottles and is taking half a tablet daily per provider.Updated medication adherence form. ? ?Care Gaps: ?Yearly foot exam overdue ?Yearly ophthalmology exam overdue ?Shingrix overdue ? ?Star Rating Drugs: ?Olmesartan 40 mg- Last filled 03-13-2021 90 DS [facility] (Contacted [facility] and med was filled 03-13-2021 and needs refills. Refill request sent. Patient stated [facility] made a mistake and sent extra so she has pills left. Patient reported taking 20 mg) ?Ozempic 0.5 mg- Patient assistance ? ?Malecca Hicks CMA ?Clinical Pharmacist Assistant ?[phone] ? ?08/09/2021 - PharmD reviewed patients current medication regimen, patient is currently taking Olmesartan 20 mg tablet daily, therefore splitting tablets which can lead to too much medication, will follow up with PCP team to change patients medication to Olmesartan 20 mg tablet moving forward.  ? ? ?

## 2021-07-27 ENCOUNTER — Ambulatory Visit (INDEPENDENT_AMBULATORY_CARE_PROVIDER_SITE_OTHER): Payer: Medicare PPO | Admitting: Internal Medicine

## 2021-07-27 ENCOUNTER — Encounter: Payer: Self-pay | Admitting: Internal Medicine

## 2021-07-27 VITALS — BP 130/70 | HR 45 | Temp 98.1°F | Ht 62.2 in | Wt 141.6 lb

## 2021-07-27 DIAGNOSIS — I1310 Hypertensive heart and chronic kidney disease without heart failure, with stage 1 through stage 4 chronic kidney disease, or unspecified chronic kidney disease: Secondary | ICD-10-CM | POA: Diagnosis not present

## 2021-07-27 DIAGNOSIS — H6121 Impacted cerumen, right ear: Secondary | ICD-10-CM

## 2021-07-27 DIAGNOSIS — E1122 Type 2 diabetes mellitus with diabetic chronic kidney disease: Secondary | ICD-10-CM | POA: Diagnosis not present

## 2021-07-27 DIAGNOSIS — N182 Chronic kidney disease, stage 2 (mild): Secondary | ICD-10-CM

## 2021-07-27 DIAGNOSIS — E039 Hypothyroidism, unspecified: Secondary | ICD-10-CM | POA: Diagnosis not present

## 2021-07-27 DIAGNOSIS — Z6825 Body mass index (BMI) 25.0-25.9, adult: Secondary | ICD-10-CM

## 2021-07-27 MED ORDER — HYDROCODONE BIT-HOMATROP MBR 5-1.5 MG/5ML PO SOLN: 5.0000 mL | Freq: Four times a day (QID) | ORAL | 0 refills | Status: DC | PRN

## 2021-07-27 MED ORDER — LEVOTHYROXINE SODIUM 88 MCG PO TABS: ORAL_TABLET | ORAL | 1 refills | Status: DC

## 2021-07-27 NOTE — Progress Notes (Signed)
?[name],acting as a Education administrator for [name], MD.,have documented all relevant documentation on the behalf of [name], MD,as directed by  [name], MD while in the presence of [name], MD.  ?This visit occurred during the SARS-CoV-2 public health emergency.  Safety protocols were in place, including screening questions prior to the visit, additional usage of staff PPE, and extensive cleaning of exam room while observing appropriate contact time as indicated for disinfecting solutions. ? ?Subjective:  ?  ? Patient ID: [name] , female    DOB: [birth year] , 76 y.o.   MRN: [id number] ? ? ?Chief Complaint  ?Patient presents with  ? Hypothyroidism  ? Diabetes  ? Hypertension  ? ? ?HPI ? ?The patient is here today for a follow-up on her diabetes, blood pressure and thyroid check. She reports compliance with meds. She denies headaches, chest pain and shortness of breath.  ? ? ? ?Diabetes ?She presents for her follow-up diabetic visit. She has type 2 diabetes mellitus. Pertinent negatives for hypoglycemia include no headaches. Pertinent negatives for diabetes include no blurred vision. There are no hypoglycemic complications. Diabetic complications include heart disease and nephropathy. Risk factors for coronary artery disease include diabetes mellitus, dyslipidemia, hypertension and post-menopausal. She is following a diabetic diet. She participates in exercise intermittently. An ACE inhibitor/angiotensin II receptor blocker is being taken. Eye exam is current.  ?Hypertension ?This is a chronic problem. The current episode started more than 1 year ago. The problem has been gradually improving since onset. The problem is controlled. Pertinent negatives include no blurred vision or headaches. The current treatment provides moderate improvement. 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   ? ?Past Medical History:  ?Diagnosis Date  ? Benign hypertensive  renal disease   ? CKD stage 2 due to type 2 diabetes mellitus ([place])   ? Hypertension   ? Hypothyroidism   ? Obesity   ? OSA on CPAP   ? Pure hypercholesterolemia   ? PVD (peripheral vascular disease) ([place])   ? Vitamin D deficiency   ?  ? ?Family History  ?Problem Relation Age of Onset  ? Alzheimer's disease Mother   ? Aneurysm Father   ? Cancer Brother   ? ? ? ?Current Outpatient Medications:  ?  albuterol (PROVENTIL HFA;VENTOLIN HFA) 108 (90 Base) MCG/ACT inhaler, Inhale 2 puffs into the lungs every 6 (six) hours as needed for wheezing or shortness of breath., Disp: 1 Inhaler, Rfl: 2 ?  amLODipine (NORVASC) 5 MG tablet, Take 5 mg by mouth at bedtime. 1/2 tab (Dr. Doylene Canard), Disp: , Rfl:  ?  Apoaequorin (PREVAGEN PO), Take 1 tablet by mouth daily., Disp: , Rfl:  ?  aspirin EC 81 MG tablet, Take 81 mg by mouth daily., Disp: , Rfl:  ?  b complex vitamins tablet, Take 1 tablet by mouth daily with lunch. , Disp: , Rfl:  ?  Calcium Carb-Cholecalciferol (CALCIUM + D3 PO), Take 1 tablet by mouth at bedtime., Disp: , Rfl:  ?  cholecalciferol (VITAMIN D) 25 MCG (1000 UNIT) tablet, Take 1 tablet by mouth daily. , Disp: , Rfl:  ?  cilostazol (PLETAL) 100 MG tablet, TAKE 1 TABLET EVERY DAY, Disp: 90 tablet, Rfl: 1 ?  Evolocumab (REPATHA SURECLICK) XX123456 MG/ML SOAJ, Inject 140 mg into the skin every 14 (fourteen) days., Disp: 6 mL, Rfl: 3 ?  furosemide (LASIX) 20 MG tablet, TAKE 1 TABLET (20 MG TOTAL) BY MOUTH DAILY., Disp: 90  tablet, Rfl: 2 ?  GNP MAGNESIUM OXIDE 250 MG TABS, TAKE 1 TABLET EVERY DAY, Disp: 90 tablet, Rfl: 3 ?  hydrALAZINE (APRESOLINE) 10 MG tablet, Take 1 tablet (10 mg total) by mouth every 8 (eight) hours. (Patient taking differently: Take 10 mg by mouth 3 (three) times daily with meals.), Disp: 90 tablet, Rfl: 3 ?  HYDROcodone bit-homatropine (HYCODAN) 5-1.5 MG/5ML syrup, Take 5 mLs by mouth every 6 (six) hours as needed for cough., Disp: 120 mL, Rfl: 0 ?  isosorbide mononitrate (IMDUR) 60 MG 24 hr tablet, TAKE  1 TABLET EVERY DAY, Disp: 90 tablet, Rfl: 2 ?  Multiple Vitamins-Minerals (MULTIVITAMIN WITH MINERALS) tablet, Take 1 tablet by mouth daily with lunch. , Disp: , Rfl:  ?  nitroGLYCERIN (NITROSTAT) 0.4 MG SL tablet, PLACE 1 TABLET UNDER THE TONGUE EVERY 5 MINUTES X 3 DOSES AS NEEDED FOR CHEST PAIN., Disp: 100 tablet, Rfl: 1 ?  olmesartan (BENICAR) 40 MG tablet, TAKE 1 TABLET EVERY DAY, Disp: 90 tablet, Rfl: 2 ?  potassium chloride (K-DUR) 10 MEQ tablet, Take 10 mEq by mouth 2 (two) times daily with breakfast and lunch. , Disp: , Rfl: 3 ?  Semaglutide,0.25 or 0.5MG /DOS, (OZEMPIC, 0.25 OR 0.5 MG/DOSE,) 2 MG/1.5ML SOPN, Inject 0.5 mg into the skin once a week., Disp: 4.5 mL, Rfl: 1 ?  levothyroxine (SYNTHROID) 88 MCG tablet, Take one tablet by mouth Monday - Saturday, Disp: 90 tablet, Rfl: 1 ?  Magnesium 250 MG TABS, Take 1 tablet (250 mg total) by mouth at bedtime. (Patient not taking: Reported on 07/27/2021), Disp: 90 tablet, Rfl: 2  ? ?Allergies  ?Allergen Reactions  ? Atorvastatin Swelling and Other (See Comments)  ?  Limbs swell  ? Clopidogrel Other (See Comments)  ?  Caused bruising  ? Metoprolol Other (See Comments)  ?  Slows heart rate too low  ? Metoprolol Tartrate Swelling and Other (See Comments)  ?  Site of swelling not noted  ? Pregabalin Swelling and Other (See Comments)  ?  Site of swelling not noted  ? Rosuvastatin Calcium Other (See Comments)  ?  Unknown reaction  ?  ? ?Review of Systems  ?Constitutional: Negative.   ?Eyes:  Negative for blurred vision.  ?Respiratory: Negative.    ?Cardiovascular: Negative.   ?Neurological: Negative.  Negative for headaches.  ?Psychiatric/Behavioral: Negative.     ? ?Today's Vitals  ? 07/27/21 1050 07/27/21 1156  ?BP: 140/68 130/70  ?Pulse: (!) 45   ?Temp: 98.1 ?F (36.7 ?C)   ?Weight: 141 lb 9.6 oz (64.2 kg)   ?Height: 5' 2.2" (1.58 m)   ?PainSc: 0-No pain   ? ?Body mass index is 25.73 kg/m?.  ?Wt Readings from Last 3 Encounters:  ?07/27/21 141 lb 9.6 oz (64.2 kg)   ?05/31/21 141 lb 5 oz (64.1 kg)  ?05/31/21 141 lb 6.4 oz (64.1 kg)  ?  ? ?Objective:  ?Physical Exam ?Vitals and nursing note reviewed.  ?Constitutional:   ?   Appearance: Normal appearance.  ?HENT:  ?   Head: Normocephalic and atraumatic.  ?   Right Ear: Ear canal and external ear normal. There is impacted cerumen.  ?   Left Ear: Tympanic membrane, ear canal and external ear normal. There is no impacted cerumen.  ?Eyes:  ?   Extraocular Movements: Extraocular movements intact.  ?Cardiovascular:  ?   Rate and Rhythm: Normal rate and regular rhythm.  ?   Heart sounds: Normal heart sounds.  ?Pulmonary:  ?   Effort: Pulmonary  effort is normal.  ?   Breath sounds: Normal breath sounds.  ?Musculoskeletal:  ?   Cervical back: Normal range of motion.  ?Skin: ?   General: Skin is warm.  ?Neurological:  ?   General: No focal deficit present.  ?   Mental Status: She is alert.  ?Psychiatric:     ?   Mood and Affect: Mood normal.     ?   Behavior: Behavior normal.  ?   ?Assessment And Plan:  ?   ?1. Type 2 diabetes mellitus with stage 2 chronic kidney disease, without long-term current use of insulin ([place]) ?Comments: Chronic, I will check labs as below. She is encouraged to exercise at least 150 minutes  per week.  ?- Urine Albumin-Creatinine with uACR ? ?2. Hypertensive heart and renal disease with renal failure, stage 1 through stage 4 or unspecified chronic kidney disease, without heart failure ?Comments: Chroinc, controlled. Encouraged to follow low sodium diet.  ? ?3. Primary hypothyroidism ?Comments: I will check TSH and adjust meds as needed. She is currently taking Synthroid 14mcg M-Sat and skipping Sundays.  ?- TSH ? ?4. Right ear impacted cerumen ?AFTER OBTAINING VERBAL CONSENT, RIGHT EAR WAS FLUSHED BY IRRIGATION. SHE TOLERATED PROCEDURE WELL WITHOUT ANY COMPLICATIONS. NO TM ABNORMALITIES WERE NOTED. ?- Ear Lavage ? ?5. BMI 25.0-25.9,adult ?Comments: BMI is acceptable for her demographic.  ?  ?Patient was given  opportunity to ask questions. Patient verbalized understanding of the plan and was able to repeat key elements of the plan. All questions were answered to their satisfaction.  ? ?I, [name], MD, have re

## 2021-07-27 NOTE — Patient Instructions (Signed)
Hypertension, Adult ?Hypertension is another name for high blood pressure. High blood pressure forces your heart to work harder to pump blood. This can cause problems over time. ?There are two numbers in a blood pressure reading. There is a top number (systolic) over a bottom number (diastolic). It is best to have a blood pressure that is below 120/80. ?What are the causes? ?The cause of this condition is not known. Some other conditions can lead to high blood pressure. ?What increases the risk? ?Some lifestyle factors can make you more likely to develop high blood pressure: ?Smoking. ?Not getting enough exercise or physical activity. ?Being overweight. ?Having too much fat, sugar, calories, or salt (sodium) in your diet. ?Drinking too much alcohol. ?Other risk factors include: ?Having any of these conditions: ?Heart disease. ?Diabetes. ?High cholesterol. ?Kidney disease. ?Obstructive sleep apnea. ?Having a family history of high blood pressure and high cholesterol. ?Age. The risk increases with age. ?Stress. ?What are the signs or symptoms? ?High blood pressure may not cause symptoms. Very high blood pressure (hypertensive crisis) may cause: ?Headache. ?Fast or uneven heartbeats (palpitations). ?Shortness of breath. ?Nosebleed. ?Vomiting or feeling like you may vomit (nauseous). ?Changes in how you see. ?Very bad chest pain. ?Feeling dizzy. ?Seizures. ?How is this treated? ?This condition is treated by making healthy lifestyle changes, such as: ?Eating healthy foods. ?Exercising more. ?Drinking less alcohol. ?Your doctor may prescribe medicine if lifestyle changes do not help enough and if: ?Your top number is above 130. ?Your bottom number is above 80. ?Your personal target blood pressure may vary. ?Follow these instructions at home: ?Eating and drinking ? ?If told, follow the DASH eating plan. To follow this plan: ?Fill one half of your plate at each meal with fruits and vegetables. ?Fill one fourth of your plate  at each meal with whole grains. Whole grains include whole-wheat pasta, brown rice, and whole-grain bread. ?Eat or drink low-fat dairy products, such as skim milk or low-fat yogurt. ?Fill one fourth of your plate at each meal with low-fat (lean) proteins. Low-fat proteins include fish, chicken without skin, eggs, beans, and tofu. ?Avoid fatty meat, cured and processed meat, or chicken with skin. ?Avoid pre-made or processed food. ?Limit the amount of salt in your diet to less than 1,500 mg each day. ?Do not drink alcohol if: ?Your doctor tells you not to drink. ?You are pregnant, may be pregnant, or are planning to become pregnant. ?If you drink alcohol: ?Limit how much you have to: ?0-1 drink a day for women. ?0-2 drinks a day for men. ?Know how much alcohol is in your drink. In the [place] one drink equals one 12 oz bottle of beer (355 mL), one 5 oz glass of wine (148 mL), or one 1? oz glass of hard liquor (44 mL). ?Lifestyle ? ?Work with your doctor to stay at a healthy weight or to lose weight. Ask your doctor what the best weight is for you. ?Get at least 30 minutes of exercise that causes your heart to beat faster (aerobic exercise) most days of the week. This may include walking, swimming, or biking. ?Get at least 30 minutes of exercise that strengthens your muscles (resistance exercise) at least 3 days a week. This may include lifting weights or doing Pilates. ?Do not smoke or use any products that contain nicotine or tobacco. If you need help quitting, ask your doctor. ?Check your blood pressure at home as told by your doctor. ?Keep all follow-up visits. ?Medicines ?Take over-the-counter and prescription medicines   only as told by your doctor. Follow directions carefully. ?Do not skip doses of blood pressure medicine. The medicine does not work as well if you skip doses. Skipping doses also puts you at risk for problems. ?Ask your doctor about side effects or reactions to medicines that you should watch  for. ?Contact a doctor if: ?You think you are having a reaction to the medicine you are taking. ?You have headaches that keep coming back. ?You feel dizzy. ?You have swelling in your ankles. ?You have trouble with your vision. ?Get help right away if: ?You get a very bad headache. ?You start to feel mixed up (confused). ?You feel weak or numb. ?You feel faint. ?You have very bad pain in your: ?Chest. ?Belly (abdomen). ?You vomit more than once. ?You have trouble breathing. ?These symptoms may be an emergency. Get help right away. Call 911. ?Do not wait to see if the symptoms will go away. ?Do not drive yourself to the hospital. ?Summary ?Hypertension is another name for high blood pressure. ?High blood pressure forces your heart to work harder to pump blood. ?For most people, a normal blood pressure is less than 120/80. ?Making healthy choices can help lower blood pressure. If your blood pressure does not get lower with healthy choices, you may need to take medicine. ?This information is not intended to replace advice given to you by your health care provider. Make sure you discuss any questions you have with your health care provider. ?Document Revised: 12/29/2020 Document Reviewed: 12/29/2020 ?Elsevier Patient Education ? 2023 [employer] ? ?

## 2021-07-28 LAB — TSH: TSH: 0.62 u[IU]/mL (ref 0.450–4.500)

## 2021-07-28 LAB — MICROALBUMIN / CREATININE URINE RATIO
Creatinine, Urine: 24.3 mg/dL
Microalb/Creat Ratio: <12 mg/g creat (ref 0–29)
Microalbumin, Urine: <3 ug/mL

## 2021-07-31 ENCOUNTER — Telehealth: Payer: Medicare PPO

## 2021-07-31 ENCOUNTER — Telehealth: Payer: Self-pay

## 2021-07-31 DIAGNOSIS — I4519 Other right bundle-branch block: Secondary | ICD-10-CM | POA: Diagnosis not present

## 2021-07-31 DIAGNOSIS — I2510 Atherosclerotic heart disease of native coronary artery without angina pectoris: Secondary | ICD-10-CM | POA: Diagnosis not present

## 2021-07-31 DIAGNOSIS — R072 Precordial pain: Secondary | ICD-10-CM | POA: Diagnosis not present

## 2021-07-31 DIAGNOSIS — E119 Type 2 diabetes mellitus without complications: Secondary | ICD-10-CM | POA: Diagnosis not present

## 2021-07-31 NOTE — Telephone Encounter (Addendum)
?  Care Management  ? ?Follow Up Note ? ? ?07/31/2021 ?Name: [name] MRN: [id number] DOB: 10/17/1945 ? ? ?Referred by: Glendale Chard, MD ?Reason for referral : Chronic Care Management (RN CM Follow up call ) ? ? ?An unsuccessful telephone outreach was attempted today. The patient was referred to the case management team for assistance with care management and care coordination.  ? ?Follow Up Plan: Telephone follow up appointment with care management team member scheduled for: ?08/15/21 ? ?Barb Merino, RN, BSN, CCM ?Care Management Coordinator ?[facility]  ?Direct Phone: [phone] ? ? ?

## 2021-08-09 MED ORDER — OLMESARTAN MEDOXOMIL 20 MG PO TABS: 20.0000 mg | ORAL_TABLET | Freq: Every day | ORAL | 2 refills | Status: DC

## 2021-08-09 NOTE — Addendum Note (Signed)
Addended by: Harlan Stains on: 08/09/2021 11:01 AM ? ? Modules accepted: Orders ? ?

## 2021-08-09 NOTE — Telephone Encounter (Signed)
?  Reviewed patients note with PCP team, and determined patients medication can be changed to Olmesartan 20 mg tablet and sent to the pharmacy. Will make sure that patient is made aware of this change.  ? ?[name], CPP, PharmD ?Clinical Pharmacist Practitioner ?[facility] ?[phone] ? ?

## 2021-08-15 ENCOUNTER — Telehealth: Payer: Medicare PPO

## 2021-08-15 ENCOUNTER — Ambulatory Visit (INDEPENDENT_AMBULATORY_CARE_PROVIDER_SITE_OTHER): Payer: Medicare PPO

## 2021-08-15 DIAGNOSIS — E039 Hypothyroidism, unspecified: Secondary | ICD-10-CM

## 2021-08-15 DIAGNOSIS — E7800 Pure hypercholesterolemia, unspecified: Secondary | ICD-10-CM

## 2021-08-15 DIAGNOSIS — E1122 Type 2 diabetes mellitus with diabetic chronic kidney disease: Secondary | ICD-10-CM

## 2021-08-15 DIAGNOSIS — I2510 Atherosclerotic heart disease of native coronary artery without angina pectoris: Secondary | ICD-10-CM

## 2021-08-15 DIAGNOSIS — I10 Essential (primary) hypertension: Secondary | ICD-10-CM

## 2021-08-15 NOTE — Chronic Care Management (AMB) (Signed)
Chronic Care Management   CCM RN Visit Note  08/15/2021 Name: [name] MRN: [id number] DOB: 04-24-1945  Subjective: [name] is a 76 y.o. year old female who is a primary care patient of Glendale Chard, MD. The care management team was consulted for assistance with disease management and care coordination needs.    Engaged with patient by telephone for follow up visit in response to provider referral for case management and/or care coordination services.   Consent to Services:  The patient was given information about Chronic Care Management services, agreed to services, and gave verbal consent prior to initiation of services.  Please see initial visit note for detailed documentation.   Patient agreed to services and verbal consent obtained.   Assessment: Review of patient past medical history, allergies, medications, health status, including review of consultants reports, laboratory and other test data, was performed as part of comprehensive evaluation and provision of chronic care management services.   SDOH (Social Determinants of Health) assessments and interventions performed:  Yes, no acute challenges   CCM Care Plan  Allergies  Allergen Reactions   Atorvastatin Swelling and Other (See Comments)    Limbs swell   Clopidogrel Other (See Comments)    Caused bruising   Metoprolol Other (See Comments)    Slows heart rate too low   Metoprolol Tartrate Swelling and Other (See Comments)    Site of swelling not noted   Pregabalin Swelling and Other (See Comments)    Site of swelling not noted   Rosuvastatin Calcium Other (See Comments)    Unknown reaction    Outpatient Encounter Medications as of 08/15/2021  Medication Sig Note   albuterol (PROVENTIL HFA;VENTOLIN HFA) 108 (90 Base) MCG/ACT inhaler Inhale 2 puffs into the lungs every 6 (six) hours as needed for wheezing or shortness of breath.    amLODipine (NORVASC) 5 MG tablet Take 5 mg by mouth at bedtime.  1/2 tab (Dr. Doylene Canard)    Apoaequorin (PREVAGEN PO) Take 1 tablet by mouth daily.    aspirin EC 81 MG tablet Take 81 mg by mouth daily. 01/31/2020: Patient would not disclose   b complex vitamins tablet Take 1 tablet by mouth daily with lunch.  01/31/2020: Patient would not disclose   Calcium Carb-Cholecalciferol (CALCIUM + D3 PO) Take 1 tablet by mouth at bedtime. 01/31/2020: Patient would not disclose   cholecalciferol (VITAMIN D) 25 MCG (1000 UNIT) tablet Take 1 tablet by mouth daily.  01/31/2020: Patient would not disclose   cilostazol (PLETAL) 100 MG tablet TAKE 1 TABLET EVERY DAY    Evolocumab (REPATHA SURECLICK) 734 MG/ML SOAJ Inject 140 mg into the skin every 14 (fourteen) days.    furosemide (LASIX) 20 MG tablet TAKE 1 TABLET (20 MG TOTAL) BY MOUTH DAILY.    GNP MAGNESIUM OXIDE 250 MG TABS TAKE 1 TABLET EVERY DAY    hydrALAZINE (APRESOLINE) 10 MG tablet Take 1 tablet (10 mg total) by mouth every 8 (eight) hours. (Patient taking differently: Take 10 mg by mouth 3 (three) times daily with meals.) 08/09/2020: Per last visit with Dr. Doylene Canard take two tablets twice per day.    HYDROcodone bit-homatropine (HYCODAN) 5-1.5 MG/5ML syrup Take 5 mLs by mouth every 6 (six) hours as needed for cough.    isosorbide mononitrate (IMDUR) 60 MG 24 hr tablet TAKE 1 TABLET EVERY DAY    levothyroxine (SYNTHROID) 88 MCG tablet Take one tablet by mouth Monday - Saturday    Magnesium 250 MG TABS Take 1  tablet (250 mg total) by mouth at bedtime. (Patient not taking: Reported on 07/27/2021)    Multiple Vitamins-Minerals (MULTIVITAMIN WITH MINERALS) tablet Take 1 tablet by mouth daily with lunch.     nitroGLYCERIN (NITROSTAT) 0.4 MG SL tablet PLACE 1 TABLET UNDER THE TONGUE EVERY 5 MINUTES X 3 DOSES AS NEEDED FOR CHEST PAIN.    olmesartan (BENICAR) 20 MG tablet Take 1 tablet (20 mg total) by mouth daily.    potassium chloride (K-DUR) 10 MEQ tablet Take 10 mEq by mouth 2 (two) times daily with breakfast and lunch.     No  facility-administered encounter medications on file as of 08/15/2021.    Patient Active Problem List   Diagnosis Date Noted   Peripheral vascular disease, unspecified ([place]) 05/31/2021   Hyperlipidemia 10/30/2019   Statin myopathy 10/30/2019   Acute bronchitis 06/06/2018   Type 2 diabetes mellitus with stage 2 chronic kidney disease, without long-term current use of insulin ([place]) 02/17/2018   Chronic renal disease, stage II 02/17/2018   Benign hypertensive heart and renal disease 02/17/2018   Primary hypothyroidism 02/17/2018   Localized osteoarthritis of right knee 02/17/2018   Essential hypertension 06/12/2017   Acute coronary syndrome ([place]) 12/26/2016   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   Insomnia with sleep apnea 10/06/2015   OSA on CPAP 10/06/2015   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 10/06/2015   CAD in native artery 10/06/2015   Chest pain at rest 07/18/2015    Class: Acute   Weakness 07/18/2015    Class: Acute   Insomnia 05/05/2015   Primary snoring 05/05/2015    Conditions to be addressed/monitored: Tpe 2 DM with stage II CKD, Hypothyroidism, Hypercholesterolemia, HTN, CAD  Care Plan : RN Care Manager Plan of Care  Updates made by Lynne Logan, RN since 08/15/2021 12:00 AM     Problem: No Plan of Care established for management of chronic disease states (Type 2 DM with stage II CKD, Hypothyroidism, Hypercholesterolemia, HTN, CAD)   Priority: High     Long-Range Goal: Establishment of plan of care for management of chronic disease states (Type 2 DM with stage II CKD, Hypothyroidism, Hypercholesterolemia, HTN, CAD)   Start Date: 03/23/2021  Expected End Date: 03/23/2022  Recent Progress: On track  Priority: High  Note:   Current Barriers:  Knowledge Deficits related to plan of care for management of Type 2 DM with stage II CKD, Hypothyroidism, Hypercholesterolemia, HTN, CAD  Chronic Disease Management support and education needs related to Type 2 DM with  stage II CKD, Hypothyroidism, Hypercholesterolemia, HTN, CAD   RNCM Clinical Goal(s):  Patient will verbalize basic understanding of  Type 2 DM with stage II CKD, Hypothyroidism, Hypercholesterolemia, HTN, CAD disease process and self health management plan as evidenced by patient will report having no disease exacerbations related to her chronic disease states  take all medications exactly as prescribed and will call provider for medication related questions as evidenced by patient will report having no missed doses of her prescribed medications demonstrate Ongoing health management independence as evidenced by patient will report 100% adherence to her prescribed treatment plan  continue to work with RN Care Manager to address care management and care coordination needs related to  Type 2 DM with stage II CKD, Hypothyroidism, Hypercholesterolemia, HTN, CAD as evidenced by adherence to CM Team Scheduled appointments demonstrate ongoing self health care management ability   as evidenced by    through collaboration with RN Care manager, provider, and care team.   Interventions:  1:1 collaboration with primary care provider regarding development and update of comprehensive plan of care as evidenced by provider attestation and co-signature Inter-disciplinary care team collaboration (see longitudinal plan of care) Evaluation of current treatment plan related to  self management and patient's adherence to plan as established by provider   Chronic Kidney Disease Interventions:  (Status:  Goal on track:  Yes.) Long Term Goal Assessed the Patient understanding of chronic kidney disease    Review of patient status, including review of consultant's reports, relevant laboratory and other test results, and medications completed. Reviewed prescribed diet increase daily water intake to 48-64 oz daily unless otherwise directed  Provided education on kidney disease progression    Last practice recorded BP readings:   BP Readings from Last 3 Encounters:  07/27/21 130/70  05/31/21 (!) 108/58  05/31/21 (!) 108/58  Most recent eGFR/CrCl:  Lab Results  Component Value Date   EGFR 48 (L) 06/20/2021    No components found for: CRCL   Result Notes   1 Patient Communication     Component Ref Range & Units 2 wk ago  Creatinine, Urine Not Estab. mg/dL 24.3   Microalbumin, Urine Not Estab. ug/mL <3.0   Comment: **Verified by repeat analysis**  Microalb/Creat Ratio 0 - 29 mg/g creat <12   Comment:                        Normal:                0 -  29                         Moderately increased: 30 - 300                         Severely increased:       >300   Resulting Agency 07/28/21 15:35 [facility]    Diabetes Interventions:  (Status:  Condition stable.  Not addressed this visit.) Long Term Goal Assessed patient's understanding of A1c goal:  <5.7 Provided education to patient about basic DM disease process Reviewed medications with patient and discussed importance of medication adherence Provided patient with written educational materials related to hypo and hyperglycemia and importance of correct treatment Advised patient, providing education and rationale, to check cbg daily before breakfast and record, calling PCP and or RN CM for findings outside established parameters Review of patient status, including review of consultants reports, relevant laboratory and other test results, and medications completed Discussed plans with patient for ongoing care management follow up and provided patient with direct contact information for care management team Lab Results  Component Value Date   HGBA1C 6.2 (H) 02/08/2021  Hyperlipidemia Interventions:  (Status:  Condition stable.  Not addressed this visit.) Long Term Goal Provider established cholesterol goals reviewed Determined patient continues to self administer her [place] without difficulty Reinforced importance of limiting foods high in  cholesterol Reinforced exercise goals and target of 150 minutes per week Mailed printed educational material related to Chair Exercises; The Skinny on Fats; Cooking to Lower my Cholesterol   Discussed plans with patient for ongoing care management follow up and provided patient with direct contact information for care management team Lipid Panel     Component Value Date/Time   CHOL 156 10/03/2020 1218   TRIG 44 10/03/2020 1218   HDL 83 10/03/2020 1218   CHOLHDL 1.9 10/03/2020 1218   CHOLHDL 3.4  07/14/2019 0433   VLDL 7 07/14/2019 0433   LDLCALC 63 10/03/2020 1218   LABVLDL 10 10/03/2020 1218   Patient Goals/Self-Care Activities: Take all medications as prescribed Attend all scheduled provider appointments Call pharmacy for medication refills 3-7 days in advance of running out of medications Perform all self care activities independently  Perform IADL's (shopping, preparing meals, housekeeping, managing finances) independently Call provider office for new concerns or questions  drink 6 to 8 glasses of water each day fill half of plate with vegetables manage portion size adhere to prescribed diet: low Saturated/low Trans fat develop an exercise routine  Follow Up Plan:  Telephone follow up appointment with care management team member scheduled for:  10/13/21     Barb Merino, RN, BSN, CCM Care Management Coordinator [facility]  Direct Phone: [phone]

## 2021-08-15 NOTE — Patient Instructions (Signed)
Visit Information  Thank you for taking time to visit with me today. Please don't hesitate to contact me if I can be of assistance to you before our next scheduled telephone appointment.  Following are the goals we discussed today:  (Copy and paste patient goals from clinical care plan here)  Our next appointment is by telephone on 10/13/21 at 10:30 AM   Please call the care guide team at [phone] if you need to cancel or reschedule your appointment.   If you are experiencing a Mental Health or Behavioral Health Crisis or need someone to talk to, please call 1-800-273-TALK (toll free, 24 hour hotline)   Patient verbalizes understanding of instructions and care plan provided today and agrees to view in MyChart. Active MyChart status and patient understanding of how to access instructions and care plan via MyChart confirmed with patient.     Delsa Sale, RN, BSN, CCM Care Management Coordinator [facility]  Direct Phone: [phone]

## 2021-08-16 ENCOUNTER — Telehealth: Payer: Medicare PPO

## 2021-08-17 ENCOUNTER — Telehealth: Payer: Self-pay

## 2021-08-17 NOTE — Chronic Care Management (AMB) (Cosign Needed Addendum)
08-17-2021: Contacted patient to to see how many bottles of Olmesartan 40 mg are left. Noticed olmesartan 20 mg was sent in on 08-07-2021 to [facility]. Left patient a voicemail to clarify.  08-18-2021: Patient stated she still has about 2 months left of the 40 mg which she is cutting in half that will last for 4 months. Patient also received 20 mg 90 DS on 08-10-2021. Patient has called [facility] to cancel her automatic refills.  [facility] Pharmacist Assistant [phone]

## 2021-08-23 DIAGNOSIS — E039 Hypothyroidism, unspecified: Secondary | ICD-10-CM

## 2021-08-23 DIAGNOSIS — E785 Hyperlipidemia, unspecified: Secondary | ICD-10-CM | POA: Diagnosis not present

## 2021-08-23 DIAGNOSIS — I129 Hypertensive chronic kidney disease with stage 1 through stage 4 chronic kidney disease, or unspecified chronic kidney disease: Secondary | ICD-10-CM

## 2021-08-23 DIAGNOSIS — E1122 Type 2 diabetes mellitus with diabetic chronic kidney disease: Secondary | ICD-10-CM | POA: Diagnosis not present

## 2021-08-23 DIAGNOSIS — N182 Chronic kidney disease, stage 2 (mild): Secondary | ICD-10-CM | POA: Diagnosis not present

## 2021-08-23 DIAGNOSIS — E7800 Pure hypercholesterolemia, unspecified: Secondary | ICD-10-CM

## 2021-08-23 DIAGNOSIS — I2510 Atherosclerotic heart disease of native coronary artery without angina pectoris: Secondary | ICD-10-CM

## 2021-10-11 ENCOUNTER — Ambulatory Visit (INDEPENDENT_AMBULATORY_CARE_PROVIDER_SITE_OTHER): Payer: Medicare PPO | Admitting: Internal Medicine

## 2021-10-11 ENCOUNTER — Encounter: Payer: Self-pay | Admitting: Internal Medicine

## 2021-10-11 VITALS — BP 112/60 | HR 64 | Temp 98.3°F | Ht 62.2 in | Wt 136.6 lb

## 2021-10-11 DIAGNOSIS — I1310 Hypertensive heart and chronic kidney disease without heart failure, with stage 1 through stage 4 chronic kidney disease, or unspecified chronic kidney disease: Secondary | ICD-10-CM | POA: Diagnosis not present

## 2021-10-11 DIAGNOSIS — R634 Abnormal weight loss: Secondary | ICD-10-CM | POA: Diagnosis not present

## 2021-10-11 DIAGNOSIS — Z6824 Body mass index (BMI) 24.0-24.9, adult: Secondary | ICD-10-CM

## 2021-10-11 DIAGNOSIS — E1122 Type 2 diabetes mellitus with diabetic chronic kidney disease: Secondary | ICD-10-CM

## 2021-10-11 DIAGNOSIS — N182 Chronic kidney disease, stage 2 (mild): Secondary | ICD-10-CM | POA: Diagnosis not present

## 2021-10-11 DIAGNOSIS — Z0000 Encounter for general adult medical examination without abnormal findings: Secondary | ICD-10-CM

## 2021-10-11 DIAGNOSIS — Z23 Encounter for immunization: Secondary | ICD-10-CM

## 2021-10-11 DIAGNOSIS — E7800 Pure hypercholesterolemia, unspecified: Secondary | ICD-10-CM

## 2021-10-11 DIAGNOSIS — E039 Hypothyroidism, unspecified: Secondary | ICD-10-CM | POA: Diagnosis not present

## 2021-10-11 LAB — POCT URINALYSIS DIPSTICK
Bilirubin, UA: NEGATIVE
Blood, UA: NEGATIVE
Glucose, UA: NEGATIVE
Ketones, UA: NEGATIVE
Nitrite, UA: NEGATIVE
Protein, UA: NEGATIVE
Spec Grav, UA: 1.015 (ref 1.010–1.025)
Urobilinogen, UA: 0.2 E.U./dL
pH, UA: 6 (ref 5.0–8.0)

## 2021-10-11 MED ORDER — OZEMPIC (0.25 OR 0.5 MG/DOSE) 2 MG/3ML ~~LOC~~ SOPN: 0.2500 mg | PEN_INJECTOR | SUBCUTANEOUS | 2 refills | Status: DC

## 2021-10-11 NOTE — Progress Notes (Signed)
[name],acting as a Education administrator for [name], MD.,have documented all relevant documentation on the behalf of [name], MD,as directed by  [name], MD while in the presence of [name], MD.   Subjective:     Patient ID: [name] , female    DOB: [birth year] , 76 y.o.   MRN: [id number]   Chief Complaint  Patient presents with   Annual Exam   Diabetes   Hypertension    HPI  The patient is here today for a physical exam. She reports compliance with meds. She denies headaches, chest pain and palpitations.   Diabetes She presents for her follow-up diabetic visit. She has type 2 diabetes mellitus. Pertinent negatives for diabetes include no blurred vision. There are no hypoglycemic complications. Diabetic complications include heart disease and nephropathy. Risk factors for coronary artery disease include diabetes mellitus, dyslipidemia, hypertension and post-menopausal. She is following a diabetic diet. She participates in exercise intermittently. An ACE inhibitor/angiotensin II receptor blocker is being taken. Eye exam is current.  Hypertension This is a chronic problem. The current episode started more than 1 year ago. The problem has been gradually improving since onset. The problem is controlled. Pertinent negatives include no blurred vision. The current treatment provides moderate improvement. 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     Past Medical History:  Diagnosis Date   Benign hypertensive renal disease    CKD stage 2 due to type 2 diabetes mellitus (HCC)    Hypertension    Hypothyroidism    Obesity    OSA on CPAP    Pure hypercholesterolemia    PVD (peripheral vascular disease) (HCC)    Vitamin D deficiency      Family History  Problem Relation Age of Onset   Alzheimer's disease Mother    Aneurysm Father    Cancer Brother      Current Outpatient Medications:    albuterol (PROVENTIL  HFA;VENTOLIN HFA) 108 (90 Base) MCG/ACT inhaler, Inhale 2 puffs into the lungs every 6 (six) hours as needed for wheezing or shortness of breath., Disp: 1 Inhaler, Rfl: 2   amLODipine (NORVASC) 5 MG tablet, Take 5 mg by mouth at bedtime. 1/2 tab (Dr. Doylene Canard), Disp: , Rfl:    Apoaequorin (PREVAGEN PO), Take 1 tablet by mouth daily., Disp: , Rfl:    aspirin EC 81 MG tablet, Take 81 mg by mouth daily., Disp: , Rfl:    b complex vitamins tablet, Take 1 tablet by mouth daily with lunch. , Disp: , Rfl:    Calcium Carb-Cholecalciferol (CALCIUM + D3 PO), Take 1 tablet by mouth at bedtime., Disp: , Rfl:    cholecalciferol (VITAMIN D) 25 MCG (1000 UNIT) tablet, Take 1 tablet by mouth daily. , Disp: , Rfl:    cilostazol (PLETAL) 100 MG tablet, TAKE 1 TABLET EVERY DAY, Disp: 90 tablet, Rfl: 1   Evolocumab (REPATHA SURECLICK) 696 MG/ML SOAJ, Inject 140 mg into the skin every 14 (fourteen) days., Disp: 6 mL, Rfl: 3   furosemide (LASIX) 20 MG tablet, TAKE 1 TABLET (20 MG TOTAL) BY MOUTH DAILY., Disp: 90 tablet, Rfl: 2   GNP MAGNESIUM OXIDE 250 MG TABS, TAKE 1 TABLET EVERY DAY, Disp: 90 tablet, Rfl: 3   hydrALAZINE (APRESOLINE) 10 MG tablet, Take 1 tablet (10 mg total) by mouth every 8 (eight) hours. (Patient taking differently: Take 10 mg by mouth 3 (three) times daily with meals.), Disp: 90 tablet, Rfl: 3  isosorbide mononitrate (IMDUR) 60 MG 24 hr tablet, TAKE 1 TABLET EVERY DAY, Disp: 90 tablet, Rfl: 2   levothyroxine (SYNTHROID) 88 MCG tablet, Take one tablet by mouth Monday - Saturday, Disp: 90 tablet, Rfl: 1   Multiple Vitamins-Minerals (MULTIVITAMIN WITH MINERALS) tablet, Take 1 tablet by mouth daily with lunch. , Disp: , Rfl:    nitroGLYCERIN (NITROSTAT) 0.4 MG SL tablet, PLACE 1 TABLET UNDER THE TONGUE EVERY 5 MINUTES X 3 DOSES AS NEEDED FOR CHEST PAIN., Disp: 100 tablet, Rfl: 1   olmesartan (BENICAR) 20 MG tablet, Take 1 tablet (20 mg total) by mouth daily., Disp: 90 tablet, Rfl: 2   potassium chloride  (K-DUR) 10 MEQ tablet, Take 10 mEq by mouth 2 (two) times daily with breakfast and lunch. , Disp: , Rfl: 3   Semaglutide,0.25 or 0.5MG/DOS, (OZEMPIC, 0.25 OR 0.5 MG/DOSE,) 2 MG/3ML SOPN, Inject 0.25 mg into the skin every Thursday. Every other week (Dr. Doylene Canard), Disp: 3 mL, Rfl: 2   Allergies  Allergen Reactions   Atorvastatin Swelling and Other (See Comments)    Limbs swell   Clopidogrel Other (See Comments)    Caused bruising   Metoprolol Other (See Comments)    Slows heart rate too low   Metoprolol Tartrate Swelling and Other (See Comments)    Site of swelling not noted   Pregabalin Swelling and Other (See Comments)    Site of swelling not noted   Rosuvastatin Calcium Other (See Comments)    Unknown reaction      The patient states she uses post menopausal status for birth control. Last LMP was No LMP recorded. Patient has had a hysterectomy.. Negative for Dysmenorrhea. Negative for: breast discharge, breast lump(s), breast pain and breast self exam. Associated symptoms include abnormal vaginal bleeding. Pertinent negatives include abnormal bleeding (hematology), anxiety, decreased libido, depression, difficulty falling sleep, dyspareunia, history of infertility, nocturia, sexual dysfunction, sleep disturbances, urinary incontinence, urinary urgency, vaginal discharge and vaginal itching. Diet regular.The patient states her exercise level is  intermittent.  . The patient's tobacco use is:  Social History   Tobacco Use  Smoking Status Never  Smokeless Tobacco Never  . She has been exposed to passive smoke. The patient's alcohol use is:  Social History   Substance and Sexual Activity  Alcohol Use No   Alcohol/week: 0.0 standard drinks of alcohol   Comment: occasional wine   Review of Systems  Constitutional:  Positive for appetite change and unexpected weight change.       She states she doesn't have much of an appetite. Usually eats once daily. No fever/chills/night sweats.    HENT: Negative.    Eyes: Negative.  Negative for blurred vision.  Respiratory: Negative.    Cardiovascular: Negative.   Gastrointestinal: Negative.   Endocrine: Negative.   Genitourinary: Negative.   Musculoskeletal: Negative.   Skin: Negative.   Allergic/Immunologic: Negative.   Neurological: Negative.   Hematological: Negative.   Psychiatric/Behavioral: Negative.       Today's Vitals   10/11/21 1016  BP: 112/60  Pulse: 64  Temp: 98.3 F (36.8 C)  Weight: 136 lb 9.6 oz (62 kg)  Height: 5' 2.2" (1.58 m)  PainSc: 0-No pain   Body mass index is 24.82 kg/m.  Wt Readings from Last 3 Encounters:  10/11/21 136 lb 9.6 oz (62 kg)  07/27/21 141 lb 9.6 oz (64.2 kg)  05/31/21 141 lb 5 oz (64.1 kg)     Objective:  Physical Exam Vitals and nursing note reviewed.  Constitutional:      General: She is not in acute distress.    Appearance: Normal appearance. She is well-developed. She is obese.  HENT:     Head: Normocephalic and atraumatic.     Right Ear: Hearing, tympanic membrane, ear canal and external ear normal. There is no impacted cerumen.     Left Ear: Hearing, tympanic membrane, ear canal and external ear normal. There is no impacted cerumen.     Nose: Nose normal.     Comments: Deferred - masked    Mouth/Throat:     Mouth: Mucous membranes are moist.     Pharynx: Oropharynx is clear.     Comments: Deferred - masked Eyes:     General: Lids are normal.     Extraocular Movements: Extraocular movements intact.     Conjunctiva/sclera: Conjunctivae normal.     Pupils: Pupils are equal, round, and reactive to light.     Funduscopic exam:    Right eye: No papilledema.        Left eye: No papilledema.  Neck:     Thyroid: No thyroid mass.     Vascular: No carotid bruit.  Cardiovascular:     Rate and Rhythm: Normal rate and regular rhythm.     Pulses:          Dorsalis pedis pulses are 1+ on the right side and 1+ on the left side.     Heart sounds: Normal heart  sounds. No murmur heard. Pulmonary:     Effort: Pulmonary effort is normal.     Breath sounds: Normal breath sounds.  Chest:     Chest wall: No mass.  Breasts:    Tanner Score is 5.     Right: Normal. No mass or tenderness.     Left: Normal. No mass or tenderness.  Abdominal:     General: Abdomen is flat. Bowel sounds are normal. There is no distension.     Palpations: Abdomen is soft.     Tenderness: There is no abdominal tenderness.  Genitourinary:    Rectum: Guaiac result negative.     Comments: deferred Musculoskeletal:        General: No swelling. Normal range of motion.     Cervical back: Full passive range of motion without pain, normal range of motion and neck supple.     Right lower leg: No edema.     Left lower leg: No edema.  Feet:     Right foot:     Protective Sensation: 5 sites tested.  5 sites sensed.     Skin integrity: Dry skin present.     Toenail Condition: Right toenails are normal.     Left foot:     Protective Sensation: 5 sites tested.  5 sites sensed.     Skin integrity: Dry skin present.     Toenail Condition: Left toenails are normal.  Lymphadenopathy:     Upper Body:     Right upper body: No supraclavicular, axillary or pectoral adenopathy.     Left upper body: No supraclavicular, axillary or pectoral adenopathy.  Skin:    General: Skin is warm and dry.     Capillary Refill: Capillary refill takes less than 2 seconds.  Neurological:     General: No focal deficit present.     Mental Status: She is alert and oriented to person, place, and time.     Cranial Nerves: No cranial nerve deficit.     Sensory: No sensory deficit.  Psychiatric:  Mood and Affect: Mood normal.        Behavior: Behavior normal.        Thought Content: Thought content normal.        Judgment: Judgment normal.       Assessment And Plan:     1. Encounter for general adult medical examination w/o abnormal findings Comments: A full exam was performed. Importance of  monthly self breast exams was discussed with the patient. PATIENT IS ADVISED TO GET 30-45 MINUTES REGULAR EXERCISE NO LESS THAN FOUR TO FIVE DAYS PER WEEK - BOTH WEIGHTBEARING EXERCISES AND AEROBIC ARE RECOMMENDED.  PATIENT IS ADVISED TO FOLLOW A HEALTHY DIET WITH AT LEAST SIX FRUITS/VEGGIES PER DAY, DECREASE INTAKE OF RED MEAT, AND TO INCREASE FISH INTAKE TO TWO DAYS PER WEEK.  MEATS/FISH SHOULD NOT BE FRIED, BAKED OR BROILED IS PREFERABLE.  IT IS ALSO IMPORTANT TO CUT BACK ON YOUR SUGAR INTAKE. PLEASE AVOID ANYTHING WITH ADDED SUGAR, CORN SYRUP OR OTHER SWEETENERS. IF YOU MUST USE A SWEETENER, YOU CAN TRY STEVIA. IT IS ALSO IMPORTANT TO AVOID ARTIFICIALLY SWEETENERS AND DIET BEVERAGES. LASTLY, I SUGGEST WEARING SPF 50 SUNSCREEN ON EXPOSED PARTS AND ESPECIALLY WHEN IN THE DIRECT SUNLIGHT FOR AN EXTENDED PERIOD OF TIME.  PLEASE AVOID FAST FOOD RESTAURANTS AND INCREASE YOUR WATER INTAKE.   2. Type 2 diabetes mellitus with stage 2 chronic kidney disease, without long-term current use of insulin (HCC) Comments: Diabetic foot exam was performed.  I plan to decrease her Ozempic to 0.67m weekly due to weight loss and decreased appetite. I DISCUSSED WITH THE PATIENT AT LENGTH REGARDING THE GOALS OF GLYCEMIC CONTROL AND POSSIBLE LONG-TERM COMPLICATIONS.  I  ALSO STRESSED THE IMPORTANCE OF COMPLIANCE WITH HOME GLUCOSE MONITORING, DIETARY RESTRICTIONS INCLUDING AVOIDANCE OF SUGARY DRINKS/PROCESSED FOODS,  ALONG WITH REGULAR EXERCISE.  I  ALSO STRESSED THE IMPORTANCE OF ANNUAL EYE EXAMS, SELF FOOT CARE AND COMPLIANCE WITH OFFICE VISITS. - POCT Urinalysis Dipstick (81002) - EKG 12-Lead - CBC - CMP14+EGFR - Lipid panel - Hemoglobin A1c  3. Hypertensive heart and renal disease with renal failure, stage 1 through stage 4 or unspecified chronic kidney disease, without heart failure Comments: Chronic, well controlled. EKG performed, SB w/ RBBB--no new changes. She is encouraged to follow low sodium diet. F/u in 6  months. Also followed by Cards.  - CBC - CMP14+EGFR - Lipid panel  4. Weight loss Comments: She has lost 5 lbs since May 2023, states she has a decreased appetite. She weighed 153 lbs in 2021. Often eats once daily. I will decrease Ozempic to 0.258m weekly. If appetite doesn't improve, will consider GI eval for possible EGD.   5. Primary hypothyroidism Comments: I will check thyroid panel and adjust meds as needed. She is currently taking Synthroid 8886mM-F daily.  - Lipid panel - TSH - T4, Free  6. Pure hypercholesterolemia Comments: Chronic, now on Repatha as per Lipid clinic. Goal LDL<70.  - Lipid panel  7. BMI 24.0-24.9, adult Comments: She is encouraged to aim for at least 150 minutes of exercise per week, this includes chair exercises while watching TV.   8. Immunization due Comments: She was given her second Shingrix, this will be billed via [employer] . - Varicella-zoster vaccine IM (Shingrix)  Patient was given opportunity to ask questions. Patient verbalized understanding of the plan and was able to repeat key elements of the plan. All questions were answered to their satisfaction.   I, RobMaximino GreenlandD, have reviewed all documentation for this  visit. The documentation on 10/11/21 for the exam, diagnosis, procedures, and orders are all accurate and complete.   THE PATIENT IS ENCOURAGED TO PRACTICE SOCIAL DISTANCING DUE TO THE COVID-19 PANDEMIC.

## 2021-10-11 NOTE — Patient Instructions (Signed)

## 2021-10-12 LAB — HEMOGLOBIN A1C
Est. average glucose Bld gHb Est-mCnc: 123 mg/dL
Hgb A1c MFr Bld: 5.9 % — ABNORMAL HIGH (ref 4.8–5.6)

## 2021-10-12 LAB — CMP14+EGFR
ALT: 26 IU/L (ref 0–32)
AST: 55 IU/L — ABNORMAL HIGH (ref 0–40)
Albumin/Globulin Ratio: 2.4 — ABNORMAL HIGH (ref 1.2–2.2)
Albumin: 4.7 g/dL (ref 3.8–4.8)
Alkaline Phosphatase: 60 IU/L (ref 44–121)
BUN/Creatinine Ratio: 15 (ref 12–28)
BUN: 18 mg/dL (ref 8–27)
Bilirubin Total: 0.3 mg/dL (ref 0.0–1.2)
CO2: 26 mmol/L (ref 20–29)
Calcium: 10.1 mg/dL (ref 8.7–10.3)
Chloride: 102 mmol/L (ref 96–106)
Creatinine, Ser: 1.23 mg/dL — ABNORMAL HIGH (ref 0.57–1.00)
Globulin, Total: 2 g/dL (ref 1.5–4.5)
Glucose: 128 mg/dL — ABNORMAL HIGH (ref 70–99)
Potassium: 4.6 mmol/L (ref 3.5–5.2)
Sodium: 141 mmol/L (ref 134–144)
Total Protein: 6.7 g/dL (ref 6.0–8.5)
eGFR: 46 mL/min/{1.73_m2} — ABNORMAL LOW (ref >59)

## 2021-10-12 LAB — CBC
Hematocrit: 36.8 % (ref 34.0–46.6)
Hemoglobin: 12.4 g/dL (ref 11.1–15.9)
MCH: 30.9 pg (ref 26.6–33.0)
MCHC: 33.7 g/dL (ref 31.5–35.7)
MCV: 92 fL (ref 79–97)
Platelets: 266 10*3/uL (ref 150–450)
RBC: 4.01 x10E6/uL (ref 3.77–5.28)
RDW: 12.1 % (ref 11.7–15.4)
WBC: 3.5 10*3/uL (ref 3.4–10.8)

## 2021-10-12 LAB — LIPID PANEL
Chol/HDL Ratio: 1.9 ratio (ref 0.0–4.4)
Cholesterol, Total: 166 mg/dL (ref 100–199)
HDL: 87 mg/dL (ref >39)
LDL Chol Calc (NIH): 70 mg/dL (ref 0–99)
Triglycerides: 42 mg/dL (ref 0–149)
VLDL Cholesterol Cal: 9 mg/dL (ref 5–40)

## 2021-10-12 LAB — T4, FREE: Free T4: 1.6 ng/dL (ref 0.82–1.77)

## 2021-10-12 LAB — TSH: TSH: 1.68 u[IU]/mL (ref 0.450–4.500)

## 2021-10-13 ENCOUNTER — Telehealth: Payer: Self-pay

## 2021-10-13 ENCOUNTER — Telehealth: Payer: Medicare PPO

## 2021-10-13 NOTE — Telephone Encounter (Signed)
  Care Management   Follow Up Note   10/13/2021 Name: [name] MRN: [id number] DOB: April 25, 1945   Referred by: [name], MD Reason for referral : Care Coordination   An unsuccessful telephone outreach was attempted today. The patient was referred to the case management team for assistance with care management and care coordination.   Follow Up Plan: Telephone follow up appointment with care management team member scheduled for: 10/17/21  Delsa Sale, RN, BSN, CCM Care Management Coordinator [facility] Care Management/[facility]  Direct Phone: [phone]

## 2021-10-17 ENCOUNTER — Ambulatory Visit: Payer: Self-pay

## 2021-10-17 NOTE — Patient Outreach (Signed)
  Care Coordination   Follow Up Visit Note   10/17/2021 Name: [name] MRN: [id number] DOB: April 23, 1945  [name] is a 76 y.o. year old female who sees Glendale Chard, MD for primary care. I spoke with  [name] by phone today  What matters to the patients health and wellness today?  "To stay healthy and manage my health conditions."   Chronic Kidney Disease Interventions:  (Status:  Goal Met.) Long Term Goal Assessed the Patient understanding of chronic kidney disease    Evaluation of current treatment plan related to chronic kidney disease self management and patient's adherence to plan as established by provider      Reviewed prescribed diet increase water to 48-64 oz daily unless otherwise directed  Provided education on kidney disease progression    Last practice recorded BP readings:  BP Readings from Last 3 Encounters:  10/11/21 112/60  07/27/21 130/70  05/31/21 (!) 108/58  Most recent eGFR/CrCl:  Lab Results  Component Value Date   EGFR 46 (L) 10/11/2021    No components found for: "CRCL"   Diabetes Interventions:  (Status:  Goal Met.) Long Term Goal Assessed patient's understanding of A1c goal: <6.5% Provided education to patient about basic DM disease process Review of patient status, including review of consultants reports, relevant laboratory and other test results, and medications completed Reinforced to patient to continue limiting carbohydrates and sugar, increase physical activity to 30 minutes each day unless otherwise directed  Lab Results  Component Value Date   HGBA1C 5.9 (H) 10/11/2021  Hyperlipidemia Interventions:  (Status:  Goal Met.) Long Term Goal Provider established cholesterol goals reviewed Determined patient continues to self administer her Rapatha without difficulty Reinforced importance of limiting foods high in cholesterol Reinforced exercise goals and target of 150 minutes per week Lipid Panel     Component  Value Date/Time   CHOL 166 10/11/2021 1106   TRIG 42 10/11/2021 1106   HDL 87 10/11/2021 1106   CHOLHDL 1.9 10/11/2021 1106   CHOLHDL 3.4 07/14/2019 0433   VLDL 7 07/14/2019 0433   LDLCALC 70 10/11/2021 1106   LABVLDL 9 10/11/2021 1106    SDOH assessments and interventions completed:   Yes  Care Coordination Interventions Activated:  Yes Care Coordination Interventions:  Yes, provided  Follow up plan: No further intervention required.  Encounter Outcome:  Pt. Visit Completed

## 2021-10-17 NOTE — Patient Instructions (Signed)
Visit Information  Thank you for taking time to visit with me today. Please don't hesitate to contact me if I can be of assistance to you.   Following are the goals we discussed today:  Take all medications as prescribed Attend all scheduled provider appointments Call pharmacy for medication refills 3-7 days in advance of running out of medications Perform all self care activities independently  Perform IADL's (shopping, preparing meals, housekeeping, managing finances) independently Call provider office for new concerns or questions  drink 6 to 8 glasses of water each day fill half of plate with vegetables manage portion size adhere to prescribed diet: low Saturated/low Trans fat develop an exercise routine  If you are experiencing a Mental Health or Behavioral Health Crisis or need someone to talk to, please call 1-800-273-TALK (toll free, 24 hour hotline)   Patient verbalizes understanding of instructions and care plan provided today and agrees to view in MyChart. Active MyChart status and patient understanding of how to access instructions and care plan via MyChart confirmed with patient.     Delsa Sale, RN, BSN, CCM Care Management Coordinator [facility] Care Management Direct Phone: [phone]

## 2021-10-17 NOTE — Chronic Care Management (AMB) (Deleted)
Care Management    RN Visit Note  10/17/2021 Name: [name] MRN: [id number] DOB: [birth year]  Subjective: [name] is a 76 y.o. year old female who is a primary care patient of Glendale Chard, MD. The care management team was consulted for assistance with disease management and care coordination needs.    {CCMTELEPHONEFACETOFACE:21091510} for {CCMINITIALFOLLOWUPCHOICE:21091511} in response to provider referral for case management and/or care coordination services.   Consent to Services:   [name] was given information about Care Management services today including:  Care Management services includes personalized support from designated clinical staff supervised by her physician, including individualized plan of care and coordination with other care providers 24/7 contact phone numbers for assistance for urgent and routine care needs. The patient may stop case management services at any time by phone call to the office staff.  Patient agreed to services and consent obtained.   Assessment: Review of patient past medical history, allergies, medications, health status, including review of consultants reports, laboratory and other test data, was performed as part of comprehensive evaluation and provision of chronic care management services.   SDOH (Social Determinants of Health) assessments and interventions performed:    Care Plan  Allergies  Allergen Reactions   Atorvastatin Swelling and Other (See Comments)    Limbs swell   Clopidogrel Other (See Comments)    Caused bruising   Metoprolol Other (See Comments)    Slows heart rate too low   Metoprolol Tartrate Swelling and Other (See Comments)    Site of swelling not noted   Pregabalin Swelling and Other (See Comments)    Site of swelling not noted   Rosuvastatin Calcium Other (See Comments)    Unknown reaction    Outpatient Encounter Medications as of 10/17/2021  Medication Sig Note   albuterol  (PROVENTIL HFA;VENTOLIN HFA) 108 (90 Base) MCG/ACT inhaler Inhale 2 puffs into the lungs every 6 (six) hours as needed for wheezing or shortness of breath.    amLODipine (NORVASC) 5 MG tablet Take 5 mg by mouth at bedtime. 1/2 tab (Dr. Doylene Canard)    Apoaequorin (PREVAGEN PO) Take 1 tablet by mouth daily.    aspirin EC 81 MG tablet Take 81 mg by mouth daily. 01/31/2020: Patient would not disclose   b complex vitamins tablet Take 1 tablet by mouth daily with lunch.  01/31/2020: Patient would not disclose   Calcium Carb-Cholecalciferol (CALCIUM + D3 PO) Take 1 tablet by mouth at bedtime. 01/31/2020: Patient would not disclose   cholecalciferol (VITAMIN D) 25 MCG (1000 UNIT) tablet Take 1 tablet by mouth daily.  01/31/2020: Patient would not disclose   cilostazol (PLETAL) 100 MG tablet TAKE 1 TABLET EVERY DAY    Evolocumab (REPATHA SURECLICK) 314 MG/ML SOAJ Inject 140 mg into the skin every 14 (fourteen) days.    furosemide (LASIX) 20 MG tablet TAKE 1 TABLET (20 MG TOTAL) BY MOUTH DAILY.    GNP MAGNESIUM OXIDE 250 MG TABS TAKE 1 TABLET EVERY DAY    hydrALAZINE (APRESOLINE) 10 MG tablet Take 1 tablet (10 mg total) by mouth every 8 (eight) hours. (Patient taking differently: Take 10 mg by mouth 3 (three) times daily with meals.) 08/09/2020: Per last visit with Dr. Doylene Canard take two tablets twice per day.    isosorbide mononitrate (IMDUR) 60 MG 24 hr tablet TAKE 1 TABLET EVERY DAY    levothyroxine (SYNTHROID) 88 MCG tablet Take one tablet by mouth Monday - Saturday    Multiple Vitamins-Minerals (MULTIVITAMIN WITH MINERALS)  tablet Take 1 tablet by mouth daily with lunch.     nitroGLYCERIN (NITROSTAT) 0.4 MG SL tablet PLACE 1 TABLET UNDER THE TONGUE EVERY 5 MINUTES X 3 DOSES AS NEEDED FOR CHEST PAIN.    olmesartan (BENICAR) 20 MG tablet Take 1 tablet (20 mg total) by mouth daily.    potassium chloride (K-DUR) 10 MEQ tablet Take 10 mEq by mouth 2 (two) times daily with breakfast and lunch.     Semaglutide,0.25 or  0.5MG/DOS, (OZEMPIC, 0.25 OR 0.5 MG/DOSE,) 2 MG/3ML SOPN Inject 0.25 mg into the skin every Thursday. Every other week (Dr. Doylene Canard)    No facility-administered encounter medications on file as of 10/17/2021.    Patient Active Problem List   Diagnosis Date Noted   Peripheral vascular disease, unspecified ([place]) 05/31/2021   Hyperlipidemia 10/30/2019   Statin myopathy 10/30/2019   Acute bronchitis 06/06/2018   Type 2 diabetes mellitus with stage 2 chronic kidney disease, without long-term current use of insulin ([place]) 02/17/2018   Chronic renal disease, stage II 02/17/2018   Benign hypertensive heart and renal disease 02/17/2018   Primary hypothyroidism 02/17/2018   Localized osteoarthritis of right knee 02/17/2018   Essential hypertension 06/12/2017   Acute coronary syndrome ([place]) 12/26/2016   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   Insomnia with sleep apnea 10/06/2015   OSA on CPAP 10/06/2015   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 10/06/2015   CAD in native artery 10/06/2015   Chest pain at rest 07/18/2015    Class: Acute   Weakness 07/18/2015    Class: Acute   Insomnia 05/05/2015   Primary snoring 05/05/2015    Conditions to be addressed/monitored: {CCM ASSESSMENT DZ OPTIONS:25047}  Care Plan : RN Care Manager Plan of Care  Updates made by Lynne Logan, RN since 10/17/2021 12:00 AM     Problem: No Plan of Care established for management of chronic disease states (Type 2 DM with stage II CKD, Hypothyroidism, Hypercholesterolemia, HTN, CAD)   Priority: High     Long-Range Goal: Establishment of plan of care for management of chronic disease states (Type 2 DM with stage II CKD, Hypothyroidism, Hypercholesterolemia, HTN, CAD)   Start Date: 03/23/2021  Expected End Date: 03/23/2022  Recent Progress: On track  Priority: High  Note:   Current Barriers:  Knowledge Deficits related to plan of care for management of Type 2 DM with stage II CKD, Hypothyroidism,  Hypercholesterolemia, HTN, CAD  Chronic Disease Management support and education needs related to Type 2 DM with stage II CKD, Hypothyroidism, Hypercholesterolemia, HTN, CAD   RNCM Clinical Goal(s):  Patient will verbalize basic understanding of  Type 2 DM with stage II CKD, Hypothyroidism, Hypercholesterolemia, HTN, CAD disease process and self health management plan as evidenced by patient will report having no disease exacerbations related to her chronic disease states  take all medications exactly as prescribed and will call provider for medication related questions as evidenced by patient will report having no missed doses of her prescribed medications demonstrate Ongoing health management independence as evidenced by patient will report 100% adherence to her prescribed treatment plan  continue to work with RN Care Manager to address care management and care coordination needs related to  Type 2 DM with stage II CKD, Hypothyroidism, Hypercholesterolemia, HTN, CAD as evidenced by adherence to CM Team Scheduled appointments demonstrate ongoing self health care management ability   as evidenced by    through collaboration with RN Care manager, provider, and care team.   Interventions: 1:1 collaboration  with primary care provider regarding development and update of comprehensive plan of care as evidenced by provider attestation and co-signature Inter-disciplinary care team collaboration (see longitudinal plan of care) Evaluation of current treatment plan related to  self management and patient's adherence to plan as established by provider    Chronic Kidney Disease Interventions:  (Status:  Goal Met.) Long Term Goal Assessed the Patient understanding of chronic kidney disease    Evaluation of current treatment plan related to chronic kidney disease self management and patient's adherence to plan as established by provider      Reviewed prescribed diet increase water to 48-64 oz daily unless  otherwise directed  Provided education on kidney disease progression    Last practice recorded BP readings:  BP Readings from Last 3 Encounters:  10/11/21 112/60  07/27/21 130/70  05/31/21 (!) 108/58  Most recent eGFR/CrCl:  Lab Results  Component Value Date   EGFR 46 (L) 10/11/2021    No components found for: "CRCL"   Diabetes Interventions:  (Status:  Goal Met.) Long Term Goal Assessed patient's understanding of A1c goal: <6.5% Provided education to patient about basic DM disease process Review of patient status, including review of consultants reports, relevant laboratory and other test results, and medications completed Reinforced to patient to continue limiting carbohydrates and sugar, increase physical activity to 30 minutes each day unless otherwise directed  Lab Results  Component Value Date   HGBA1C 5.9 (H) 10/11/2021  Hyperlipidemia Interventions:  (Status:  Goal Met.) Long Term Goal Provider established cholesterol goals reviewed Determined patient continues to self administer her [place] without difficulty Reinforced importance of limiting foods high in cholesterol Reinforced exercise goals and target of 150 minutes per week Lipid Panel     Component Value Date/Time   CHOL 166 10/11/2021 1106   TRIG 42 10/11/2021 1106   HDL 87 10/11/2021 1106   CHOLHDL 1.9 10/11/2021 1106   CHOLHDL 3.4 07/14/2019 0433   VLDL 7 07/14/2019 0433   LDLCALC 70 10/11/2021 1106   LABVLDL 9 10/11/2021 1106      Patient Goals/Self-Care Activities: Take all medications as prescribed Attend all scheduled provider appointments Call pharmacy for medication refills 3-7 days in advance of running out of medications Perform all self care activities independently  Perform IADL's (shopping, preparing meals, housekeeping, managing finances) independently Call provider office for new concerns or questions  drink 6 to 8 glasses of water each day fill half of plate with vegetables manage  portion size adhere to prescribed diet: low Saturated/low Trans fat develop an exercise routine  Follow Up Plan:  No further follow up required      Plan: {CM FOLLOW UP PLAN:25073}  SIG***

## 2021-10-17 NOTE — Progress Notes (Signed)
This encounter was created in error - please disregard.

## 2021-10-20 NOTE — Progress Notes (Signed)
This encounter was created in error - please disregard.

## 2021-10-24 ENCOUNTER — Telehealth: Payer: Self-pay

## 2021-10-24 NOTE — Chronic Care Management (AMB) (Signed)
    Chronic Care Management Pharmacy Assistant   Name: [name]  MRN: [id number] DOB: 1945/06/27  Reason for Encounter: Patient assistance   10-24-2021: Contacted novo to follow up on Ozempic. Was informed that patient's application was missing a date on one section since January. Representative added the date and processed delivery. Shipment should arrive 10-14 days and patient isn't eligible for a free voucher. Patient took last injection this week. Sent message to [facility] to see if a sample is available for patient  Medications: Outpatient Encounter Medications as of 10/24/2021  Medication Sig Note   albuterol (PROVENTIL HFA;VENTOLIN HFA) 108 (90 Base) MCG/ACT inhaler Inhale 2 puffs into the lungs every 6 (six) hours as needed for wheezing or shortness of breath.    amLODipine (NORVASC) 5 MG tablet Take 5 mg by mouth at bedtime. 1/2 tab (Dr. Algie Coffer)    Apoaequorin (PREVAGEN PO) Take 1 tablet by mouth daily.    aspirin EC 81 MG tablet Take 81 mg by mouth daily. 01/31/2020: Patient would not disclose   b complex vitamins tablet Take 1 tablet by mouth daily with lunch.  01/31/2020: Patient would not disclose   Calcium Carb-Cholecalciferol (CALCIUM + D3 PO) Take 1 tablet by mouth at bedtime. 01/31/2020: Patient would not disclose   cholecalciferol (VITAMIN D) 25 MCG (1000 UNIT) tablet Take 1 tablet by mouth daily.  01/31/2020: Patient would not disclose   cilostazol (PLETAL) 100 MG tablet TAKE 1 TABLET EVERY DAY    Evolocumab (REPATHA SURECLICK) 140 MG/ML SOAJ Inject 140 mg into the skin every 14 (fourteen) days.    furosemide (LASIX) 20 MG tablet TAKE 1 TABLET (20 MG TOTAL) BY MOUTH DAILY.    GNP MAGNESIUM OXIDE 250 MG TABS TAKE 1 TABLET EVERY DAY    hydrALAZINE (APRESOLINE) 10 MG tablet Take 1 tablet (10 mg total) by mouth every 8 (eight) hours. (Patient taking differently: Take 10 mg by mouth 3 (three) times daily with meals.) 08/09/2020: Per last visit with Dr. Algie Coffer take two  tablets twice per day.    isosorbide mononitrate (IMDUR) 60 MG 24 hr tablet TAKE 1 TABLET EVERY DAY    levothyroxine (SYNTHROID) 88 MCG tablet Take one tablet by mouth Monday - Saturday    Multiple Vitamins-Minerals (MULTIVITAMIN WITH MINERALS) tablet Take 1 tablet by mouth daily with lunch.     nitroGLYCERIN (NITROSTAT) 0.4 MG SL tablet PLACE 1 TABLET UNDER THE TONGUE EVERY 5 MINUTES X 3 DOSES AS NEEDED FOR CHEST PAIN.    olmesartan (BENICAR) 20 MG tablet Take 1 tablet (20 mg total) by mouth daily.    potassium chloride (K-DUR) 10 MEQ tablet Take 10 mEq by mouth 2 (two) times daily with breakfast and lunch.     Semaglutide,0.25 or 0.5MG /DOS, (OZEMPIC, 0.25 OR 0.5 MG/DOSE,) 2 MG/3ML SOPN Inject 0.25 mg into the skin every Thursday. Every other week (Dr. Algie Coffer)    No facility-administered encounter medications on file as of 10/24/2021.    Huey Romans [facility] Clinical Pharmacist Assistant [phone]

## 2021-10-25 ENCOUNTER — Telehealth: Payer: Self-pay

## 2021-11-03 NOTE — Chronic Care Management (AMB) (Signed)
Novo Nordisk patient assistance program notification:  Patient has been approved to receive Ozempic 0.25 mg and a 120- day supply should arrive to the office in 10-14 business days. Patient enrollment will expire on 02/22/2022.  Billee Cashing, CMA Clinical Pharmacist Assistant [phone]

## 2021-11-06 DIAGNOSIS — I2510 Atherosclerotic heart disease of native coronary artery without angina pectoris: Secondary | ICD-10-CM | POA: Diagnosis not present

## 2021-11-06 DIAGNOSIS — R072 Precordial pain: Secondary | ICD-10-CM | POA: Diagnosis not present

## 2021-11-06 DIAGNOSIS — I361 Nonrheumatic tricuspid (valve) insufficiency: Secondary | ICD-10-CM | POA: Diagnosis not present

## 2021-11-13 ENCOUNTER — Telehealth: Payer: Self-pay

## 2021-11-13 NOTE — Chronic Care Management (AMB) (Signed)
    [name] was reminded to have all medications, supplements and any blood glucose and blood pressure readings available for review with [name], Pharm. D, at her telephone visit on 11-15-2021 at 12:00.   Questions: Have you had any recent office visit or specialist visit outside of [facility]? Patient stated no  Are there any concerns you would like to discuss during your office visit? Patient stated no  Are you having any problems obtaining your medications? (Whether it pharmacy issues or cost). Patient stated no  If patient has any PAP medications ask if they are having any problems getting their PAP medication or refill? No. Ozempic should arrive this week.  Care Gaps: Yearly ophthalmology exam overdue Covid booster overdue Flu vaccine overdue  Star Rating Drug: Olmesartan 40 mg- Last filled 08-10-2021 90 [facility] (Contacted pharmacy and medication is being processed for delivery) Ozempic 0.5 mg- Patient assistance  Any gaps in medications fill history? No   Huey Romans [facility] Clinical Pharmacist Assistant [phone]

## 2021-11-14 ENCOUNTER — Telehealth: Payer: Self-pay

## 2021-11-14 DIAGNOSIS — E119 Type 2 diabetes mellitus without complications: Secondary | ICD-10-CM | POA: Diagnosis not present

## 2021-11-14 DIAGNOSIS — H16223 Keratoconjunctivitis sicca, not specified as Sjogren's, bilateral: Secondary | ICD-10-CM | POA: Diagnosis not present

## 2021-11-14 DIAGNOSIS — H2513 Age-related nuclear cataract, bilateral: Secondary | ICD-10-CM | POA: Diagnosis not present

## 2021-11-14 DIAGNOSIS — H401131 Primary open-angle glaucoma, bilateral, mild stage: Secondary | ICD-10-CM | POA: Diagnosis not present

## 2021-11-14 NOTE — Telephone Encounter (Signed)
  Care Management   Follow Up Note   11/14/2021 Name: [name] MRN: [id number] DOB: 11-01-1945   Referred by: Glendale Chard, MD Reason for referral : No chief complaint on file.   Successful contact was made with patient to discuss care management and coordination services. \Patient at this time has met goals. Patient to be moved to self care at this point. Patient voiced understanding.   Follow Up Plan: The patient has been provided with contact information for the care management team and has been advised to call with any health related questions or concerns.   Orlando Penner, CPP, PharmD Clinical Pharmacist Practitioner [facility] [phone]

## 2021-11-14 NOTE — Telephone Encounter (Signed)
Please see note from same day.  [name], CPP, PharmD Clinical Pharmacist Practitioner [facility] [phone]

## 2021-11-15 ENCOUNTER — Telehealth: Payer: Medicare PPO

## 2021-11-19 ENCOUNTER — Other Ambulatory Visit: Payer: Self-pay | Admitting: Internal Medicine

## 2021-12-05 ENCOUNTER — Other Ambulatory Visit: Payer: Self-pay | Admitting: Internal Medicine

## 2022-01-10 ENCOUNTER — Telehealth: Payer: Self-pay

## 2022-01-10 NOTE — Chronic Care Management (AMB) (Signed)
Novo Nordisk patient assistance program notification:  120- day supply of Ozempic 0.25/0.5 mg was filled on 01/03/2022 and should arrive to the office in 10-14 business days. Patient has 0  refill remaining and enrollment will expire on 03/25/2022.  No further actions required, patient due for 2024 re-enrollment.    Pattricia Boss, [place] Pharmacist Assistant [phone]

## 2022-01-10 NOTE — Chronic Care Management (AMB) (Addendum)
A user error has taken place: encounter opened in error, closed for administrative reasons.

## 2022-01-16 DIAGNOSIS — H2513 Age-related nuclear cataract, bilateral: Secondary | ICD-10-CM | POA: Diagnosis not present

## 2022-01-16 DIAGNOSIS — H401131 Primary open-angle glaucoma, bilateral, mild stage: Secondary | ICD-10-CM | POA: Diagnosis not present

## 2022-01-16 DIAGNOSIS — H16223 Keratoconjunctivitis sicca, not specified as Sjogren's, bilateral: Secondary | ICD-10-CM | POA: Diagnosis not present

## 2022-01-16 DIAGNOSIS — E119 Type 2 diabetes mellitus without complications: Secondary | ICD-10-CM | POA: Diagnosis not present

## 2022-01-23 ENCOUNTER — Other Ambulatory Visit: Payer: Self-pay

## 2022-01-23 MED ORDER — NITROGLYCERIN 0.4 MG SL SUBL
SUBLINGUAL_TABLET | SUBLINGUAL | 1 refills | Status: AC
Start: 2022-01-23 — End: ?

## 2022-02-12 ENCOUNTER — Ambulatory Visit (INDEPENDENT_AMBULATORY_CARE_PROVIDER_SITE_OTHER): Payer: Medicare PPO | Admitting: Internal Medicine

## 2022-02-12 ENCOUNTER — Encounter: Payer: Self-pay | Admitting: Internal Medicine

## 2022-02-12 VITALS — BP 112/70 | HR 53 | Temp 97.7°F | Ht 61.6 in | Wt 138.2 lb

## 2022-02-12 DIAGNOSIS — Z6825 Body mass index (BMI) 25.0-25.9, adult: Secondary | ICD-10-CM

## 2022-02-12 DIAGNOSIS — I1310 Hypertensive heart and chronic kidney disease without heart failure, with stage 1 through stage 4 chronic kidney disease, or unspecified chronic kidney disease: Secondary | ICD-10-CM | POA: Diagnosis not present

## 2022-02-12 DIAGNOSIS — Z23 Encounter for immunization: Secondary | ICD-10-CM

## 2022-02-12 DIAGNOSIS — N1831 Chronic kidney disease, stage 3a: Secondary | ICD-10-CM | POA: Diagnosis not present

## 2022-02-12 DIAGNOSIS — E1122 Type 2 diabetes mellitus with diabetic chronic kidney disease: Secondary | ICD-10-CM

## 2022-02-12 DIAGNOSIS — E039 Hypothyroidism, unspecified: Secondary | ICD-10-CM | POA: Diagnosis not present

## 2022-02-12 MED ORDER — OZEMPIC (0.25 OR 0.5 MG/DOSE) 2 MG/3ML ~~LOC~~ SOPN: PEN_INJECTOR | SUBCUTANEOUS | 2 refills | Status: AC

## 2022-02-12 NOTE — Progress Notes (Signed)
[name],acting as a Education administrator for [name], MD.,have documented all relevant documentation on the behalf of [name], MD,as directed by  [name], MD while in the presence of [name], MD.    Subjective:     Patient ID: [name] , female    DOB: Oct 06, 1945 , 76 y.o.   MRN: [id number]   Chief Complaint  Patient presents with   Diabetes   Hypertension    HPI  The patient is here today for a follow-up on her diabetes, blood pressure and thyroid check. She reports compliance with meds. She denies headaches, chest pain and shortness of breath.     Diabetes She presents for her follow-up diabetic visit. She has type 2 diabetes mellitus. Pertinent negatives for hypoglycemia include no headaches. Pertinent negatives for diabetes include no blurred vision, no polydipsia, no polyphagia and no polyuria. There are no hypoglycemic complications. Diabetic complications include heart disease and nephropathy. Risk factors for coronary artery disease include diabetes mellitus, dyslipidemia, hypertension and post-menopausal. She is following a diabetic diet. She participates in exercise intermittently. An ACE inhibitor/angiotensin II receptor blocker is being taken. Eye exam is current.  Hypertension This is a chronic problem. The current episode started more than 1 year ago. The problem has been gradually improving since onset. The problem is controlled. Pertinent negatives include no blurred vision or headaches. The current treatment provides moderate improvement. 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     Past Medical History:  Diagnosis Date   Benign hypertensive renal disease    CKD stage 2 due to type 2 diabetes mellitus (HCC)    Hypertension    Hypothyroidism    Obesity    OSA on CPAP    Pure hypercholesterolemia    PVD (peripheral vascular disease) (HCC)    Vitamin D deficiency      Family History  Problem  Relation Age of Onset   Alzheimer's disease Mother    Aneurysm Father    Cancer Brother      Current Outpatient Medications:    ALPHAGAN P 0.1 % SOLN, , Disp: , Rfl:    amLODipine (NORVASC) 5 MG tablet, Take 5 mg by mouth at bedtime. 1/2 tab (Dr. Doylene Canard), Disp: , Rfl:    aspirin EC 81 MG tablet, Take 81 mg by mouth daily., Disp: , Rfl:    b complex vitamins tablet, Take 1 tablet by mouth daily with lunch. , Disp: , Rfl:    Calcium Carb-Cholecalciferol (CALCIUM + D3 PO), Take 1 tablet by mouth at bedtime., Disp: , Rfl:    cholecalciferol (VITAMIN D) 25 MCG (1000 UNIT) tablet, Take 1 tablet by mouth daily. , Disp: , Rfl:    cilostazol (PLETAL) 100 MG tablet, TAKE 1 TABLET EVERY DAY, Disp: 90 tablet, Rfl: 1   furosemide (LASIX) 20 MG tablet, TAKE 1 TABLET (20 MG TOTAL) BY MOUTH DAILY., Disp: 90 tablet, Rfl: 2   GNP MAGNESIUM OXIDE 250 MG TABS, TAKE 1 TABLET EVERY DAY, Disp: 90 tablet, Rfl: 3   hydrALAZINE (APRESOLINE) 10 MG tablet, Take 1 tablet (10 mg total) by mouth every 8 (eight) hours. (Patient taking differently: Take 10 mg by mouth 3 (three) times daily with meals.), Disp: 90 tablet, Rfl: 3   isosorbide mononitrate (IMDUR) 60 MG 24 hr tablet, TAKE 1 TABLET EVERY DAY, Disp: 90 tablet, Rfl: 2   levothyroxine (SYNTHROID) 88 MCG tablet, TAKE 1 TABLET EVERY DAY BEFORE BREAKFAST, Disp: 90 tablet, Rfl:  1   Multiple Vitamins-Minerals (MULTIVITAMIN WITH MINERALS) tablet, Take 1 tablet by mouth daily with lunch. , Disp: , Rfl:    nitroGLYCERIN (NITROSTAT) 0.4 MG SL tablet, PLACE 1 TABLET UNDER THE TONGUE EVERY 5 MINUTES X 3 DOSES AS NEEDED FOR CHEST PAIN., Disp: 100 tablet, Rfl: 1   olmesartan (BENICAR) 20 MG tablet, Take 1 tablet (20 mg total) by mouth daily., Disp: 90 tablet, Rfl: 2   potassium chloride (K-DUR) 10 MEQ tablet, Take 10 mEq by mouth 2 (two) times daily with breakfast and lunch. , Disp: , Rfl: 3   REPATHA SURECLICK 282 MG/ML SOAJ, ADMINISTER 1 ML UNDER THE SKIN EVERY 14 DAYS, Disp:  6 mL, Rfl: 3   albuterol (PROVENTIL HFA;VENTOLIN HFA) 108 (90 Base) MCG/ACT inhaler, Inhale 2 puffs into the lungs every 6 (six) hours as needed for wheezing or shortness of breath., Disp: 1 Inhaler, Rfl: 2   Apoaequorin (PREVAGEN PO), Take 1 tablet by mouth daily., Disp: , Rfl:    [START ON 02/15/2022] Semaglutide,0.25 or 0.5MG/DOS, (OZEMPIC, 0.25 OR 0.5 MG/DOSE,) 2 MG/3ML SOPN, Inject 0.46m sq once weekly, Disp: 3 mL, Rfl: 2   Allergies  Allergen Reactions   Atorvastatin Swelling and Other (See Comments)    Limbs swell   Clopidogrel Other (See Comments)    Caused bruising   Metoprolol Other (See Comments)    Slows heart rate too low   Metoprolol Tartrate Swelling and Other (See Comments)    Site of swelling not noted   Pregabalin Swelling and Other (See Comments)    Site of swelling not noted   Rosuvastatin Calcium Other (See Comments)    Unknown reaction     Review of Systems  Constitutional: Negative.   Eyes: Negative.  Negative for blurred vision.  Respiratory: Negative.    Cardiovascular: Negative.   Endocrine: Negative for polydipsia, polyphagia and polyuria.  Musculoskeletal: Negative.   Skin: Negative.   Neurological: Negative.  Negative for headaches.  Psychiatric/Behavioral: Negative.       Today's Vitals   02/12/22 0848  BP: 112/70  Pulse: (!) 53  Temp: 97.7 F (36.5 C)  Weight: 138 lb 3.2 oz (62.7 kg)  Height: 5' 1.6" (1.565 m)  PainSc: 0-No pain   Body mass index is 25.61 kg/m.  Wt Readings from Last 3 Encounters:  02/12/22 138 lb 3.2 oz (62.7 kg)  10/11/21 136 lb 9.6 oz (62 kg)  07/27/21 141 lb 9.6 oz (64.2 kg)     Objective:  Physical Exam Vitals and nursing note reviewed.  Constitutional:      Appearance: Normal appearance.  HENT:     Head: Normocephalic and atraumatic.     Nose:     Comments: Masked     Mouth/Throat:     Comments: Masked  Eyes:     Extraocular Movements: Extraocular movements intact.  Cardiovascular:     Rate and  Rhythm: Normal rate and regular rhythm.     Heart sounds: Murmur heard.  Pulmonary:     Effort: Pulmonary effort is normal.     Breath sounds: Normal breath sounds.  Musculoskeletal:     Cervical back: Normal range of motion.  Skin:    General: Skin is warm.  Neurological:     General: No focal deficit present.     Mental Status: She is alert.  Psychiatric:        Mood and Affect: Mood normal.        Behavior: Behavior normal.  Assessment And Plan:     1. Type 2 diabetes mellitus with stage 3a chronic kidney disease, without long-term current use of insulin (HCC) Comments: Chronic, I will check labs as below.  She will c/w Ozempic 0.38m weekly. She will f/u in 4 months for re-evaluation. I will check Renal labs as well. - Hemoglobin A1c - CMP14+EGFR - Amb Referral To Provider Referral Exercise Program (P.R.E.P) - Protein electrophoresis, serum - Parathyroid Hormone, Intact w/Ca - Phosphorus  2. Hypertensive heart and renal disease with renal failure, stage 1 through stage 4 or unspecified chronic kidney disease, without heart failure Comments: Chronic, well controlled. I have reviewed her home BP log as well. BP ranges from `106/65-140/62. Average reading is 120/63. - CMP14+EGFR - Amb Referral To Provider Referral Exercise Program (P.R.E.P)  3. Primary hypothyroidism Comments: Chronic, I will check thyroid panel and adjust meds as needed. She will rto in 4 months for re-evaluation. - TSH - T4, Free  4. Immunization due - Flu Vaccine QUAD High Dose(Fluad)  5. BMI 25.0-25.9,adult Comments: Her weight is stable. She is encouraged to incorporate at least 1549mutes per week. She agrees to PRCitigroupeferral. - CMP14+EGFR - Amb Referral To Provider Referral Exercise Program (P.R.E.P)   Patient was given opportunity to ask questions. Patient verbalized understanding of the plan and was able to repeat key elements of the plan. All questions were answered to their satisfaction.    I, RoMaximino GreenlandMD, have reviewed all documentation for this visit. The documentation on 02/12/22 for the exam, diagnosis, procedures, and orders are all accurate and complete.   IF YOU HAVE BEEN REFERRED TO A SPECIALIST, IT MAY TAKE 1-2 WEEKS TO SCHEDULE/PROCESS THE REFERRAL. IF YOU HAVE NOT HEARD FROM US/SPECIALIST IN TWO WEEKS, PLEASE GIVE [place] CALL AT [phone].   THE PATIENT IS ENCOURAGED TO PRACTICE SOCIAL DISTANCING DUE TO THE COVID-19 PANDEMIC.

## 2022-02-12 NOTE — Patient Instructions (Signed)

## 2022-02-14 ENCOUNTER — Encounter: Payer: Self-pay | Admitting: Internal Medicine

## 2022-02-14 ENCOUNTER — Telehealth: Payer: Self-pay

## 2022-02-14 NOTE — Telephone Encounter (Signed)
Call to pt reference PREP class. Explained program. Interested in participating. Can do a 1030a class, wants to start in January.  Advised will call her back in Jan to start and meet with her 1:1. Given cell and desk phone numbers.

## 2022-02-16 LAB — CMP14+EGFR
ALT: 18 IU/L (ref 0–32)
AST: 40 IU/L (ref 0–40)
Albumin/Globulin Ratio: 2.2 (ref 1.2–2.2)
Albumin: 4.4 g/dL (ref 3.8–4.8)
Alkaline Phosphatase: 52 IU/L (ref 44–121)
BUN/Creatinine Ratio: 12 (ref 12–28)
BUN: 14 mg/dL (ref 8–27)
Bilirubin Total: 0.3 mg/dL (ref 0.0–1.2)
CO2: 23 mmol/L (ref 20–29)
Calcium: 10.6 mg/dL — ABNORMAL HIGH (ref 8.7–10.3)
Chloride: 103 mmol/L (ref 96–106)
Creatinine, Ser: 1.14 mg/dL — ABNORMAL HIGH (ref 0.57–1.00)
Globulin, Total: 2 g/dL (ref 1.5–4.5)
Glucose: 87 mg/dL (ref 70–99)
Potassium: 4.7 mmol/L (ref 3.5–5.2)
Sodium: 139 mmol/L (ref 134–144)
Total Protein: 6.4 g/dL (ref 6.0–8.5)
eGFR: 50 mL/min/{1.73_m2} — ABNORMAL LOW (ref >59)

## 2022-02-16 LAB — PROTEIN ELECTROPHORESIS, SERUM
A/G Ratio: 1.5 (ref 0.7–1.7)
Albumin ELP: 3.8 g/dL (ref 2.9–4.4)
Alpha 1: 0.2 g/dL (ref 0.0–0.4)
Alpha 2: 0.7 g/dL (ref 0.4–1.0)
Beta: 0.8 g/dL (ref 0.7–1.3)
Gamma Globulin: 0.9 g/dL (ref 0.4–1.8)
Globulin, Total: 2.6 g/dL (ref 2.2–3.9)

## 2022-02-16 LAB — PTH, INTACT AND CALCIUM: PTH: 33 pg/mL (ref 15–65)

## 2022-02-16 LAB — HEMOGLOBIN A1C
Est. average glucose Bld gHb Est-mCnc: 117 mg/dL
Hgb A1c MFr Bld: 5.7 % — ABNORMAL HIGH (ref 4.8–5.6)

## 2022-02-16 LAB — TSH: TSH: 1.73 u[IU]/mL (ref 0.450–4.500)

## 2022-02-16 LAB — T4, FREE: Free T4: 1.43 ng/dL (ref 0.82–1.77)

## 2022-02-16 LAB — PHOSPHORUS: Phosphorus: 4.2 mg/dL (ref 3.0–4.3)

## 2022-02-20 ENCOUNTER — Other Ambulatory Visit: Payer: Self-pay | Admitting: Internal Medicine

## 2022-02-20 ENCOUNTER — Telehealth: Payer: Self-pay

## 2022-02-20 NOTE — Chronic Care Management (AMB) (Signed)
[employer] patient assistance program notification:  120- day supply of Ozempic 0.5 mg was filled on 02/05/2022 and should arrive to the office in 10-14 business days. Patient has 0  refill remaining and enrollment will expire on 03/25/2022.  Billee Cashing, CMA Clinical Pharmacist Assistant [phone]

## 2022-02-27 ENCOUNTER — Telehealth: Payer: Self-pay

## 2022-02-27 NOTE — Chronic Care Management (AMB) (Signed)
Faxed 2024 re-enrollment application to [employer] patient assistance for Ozempic.    Tamala Melvin, CMA Clinical Pharmacist Assistant [phone]' 

## 2022-03-05 DIAGNOSIS — I361 Nonrheumatic tricuspid (valve) insufficiency: Secondary | ICD-10-CM | POA: Diagnosis not present

## 2022-03-05 DIAGNOSIS — I2510 Atherosclerotic heart disease of native coronary artery without angina pectoris: Secondary | ICD-10-CM | POA: Diagnosis not present

## 2022-03-05 DIAGNOSIS — R072 Precordial pain: Secondary | ICD-10-CM | POA: Diagnosis not present

## 2022-03-12 ENCOUNTER — Encounter: Payer: Self-pay | Admitting: Internal Medicine

## 2022-03-15 ENCOUNTER — Telehealth: Payer: Self-pay

## 2022-03-15 NOTE — Chronic Care Management (AMB) (Signed)
03-15-2022: Patient was informed that Ozempic is ready for pick up. Patient has enough supply and will pick up on 03-21-2022.  Huey Romans [facility] Clinical Pharmacist Assistant [phone]'

## 2022-03-21 ENCOUNTER — Telehealth: Payer: Self-pay

## 2022-03-21 NOTE — Telephone Encounter (Signed)
Call to pt reference starting PREP on 04/02/22.  Needs to check her calendar before committing. Sts has my number for call back.

## 2022-04-06 ENCOUNTER — Telehealth: Payer: Self-pay

## 2022-04-06 NOTE — Progress Notes (Cosign Needed)
Patient approved to receive Ozempic through [employer] patient assistance program. Enrollment ends 03/26/2023.   Tamala Melvin, CMA Clinical Pharmacist Assistant [phone]  

## 2022-04-10 ENCOUNTER — Other Ambulatory Visit: Payer: Self-pay

## 2022-04-10 MED ORDER — POTASSIUM CHLORIDE ER 10 MEQ PO TBCR: 10.0000 meq | EXTENDED_RELEASE_TABLET | Freq: Two times a day (BID) | ORAL | 1 refills | Status: DC

## 2022-04-10 MED ORDER — ISOSORBIDE MONONITRATE ER 60 MG PO TB24: 60.0000 mg | ORAL_TABLET | Freq: Every day | ORAL | 1 refills | Status: DC

## 2022-04-12 ENCOUNTER — Other Ambulatory Visit (HOSPITAL_COMMUNITY): Payer: Self-pay

## 2022-04-12 ENCOUNTER — Other Ambulatory Visit: Payer: Self-pay | Admitting: Internal Medicine

## 2022-04-12 DIAGNOSIS — Z1231 Encounter for screening mammogram for malignant neoplasm of breast: Secondary | ICD-10-CM

## 2022-04-13 ENCOUNTER — Telehealth: Payer: Self-pay

## 2022-04-13 NOTE — Telephone Encounter (Signed)
Pharmacy Patient Advocate Encounter  Prior Authorization for REPATHA 140 MG/ML INJ has been approved.    PA# [id number] Effective dates: 04/13/22 through 10/12/22   Received notification from [place] that prior authorization for REPATHA 140 MG/ML INJ is needed.    PA submitted on 04/13/22 Key [id number] Status is pending  Karie Soda, [place] Patient Advocate Specialist Direct Number: [phone] Fax: [phone]

## 2022-04-24 ENCOUNTER — Ambulatory Visit
Admission: RE | Admit: 2022-04-24 | Discharge: 2022-04-24 | Disposition: A | Discharge status: Home / Self Care | Payer: Medicare Other | Source: Ambulatory Visit | Attending: Internal Medicine | Admitting: Internal Medicine

## 2022-04-24 DIAGNOSIS — Z1231 Encounter for screening mammogram for malignant neoplasm of breast: Secondary | ICD-10-CM

## 2022-04-27 ENCOUNTER — Telehealth: Payer: Self-pay

## 2022-04-27 NOTE — Telephone Encounter (Signed)
Call to pt to offer next PREP class.  Unable to PREP right now. Has number for call back when she can participate.

## 2022-05-10 ENCOUNTER — Other Ambulatory Visit: Payer: Self-pay

## 2022-05-10 MED ORDER — ONETOUCH VERIO FLEX SYSTEM W/DEVICE KIT: PACK | 0 refills | Status: DC

## 2022-05-10 MED ORDER — GLUCOSE BLOOD VI STRP: ORAL_STRIP | 12 refills | Status: DC

## 2022-05-16 ENCOUNTER — Other Ambulatory Visit: Payer: Self-pay

## 2022-05-16 MED ORDER — CILOSTAZOL 100 MG PO TABS: 100.0000 mg | ORAL_TABLET | Freq: Every day | ORAL | 1 refills | Status: DC

## 2022-05-16 MED ORDER — OLMESARTAN MEDOXOMIL 20 MG PO TABS: 20.0000 mg | ORAL_TABLET | Freq: Every day | ORAL | 2 refills | Status: DC

## 2022-05-29 ENCOUNTER — Other Ambulatory Visit: Payer: Self-pay

## 2022-05-29 MED ORDER — OLMESARTAN MEDOXOMIL 20 MG PO TABS: 20.0000 mg | ORAL_TABLET | Freq: Every day | ORAL | 2 refills | Status: DC

## 2022-05-29 MED ORDER — CILOSTAZOL 100 MG PO TABS: 100.0000 mg | ORAL_TABLET | Freq: Every day | ORAL | 1 refills | Status: DC

## 2022-06-01 ENCOUNTER — Other Ambulatory Visit: Payer: Self-pay | Admitting: Internal Medicine

## 2022-06-01 MED ORDER — ONETOUCH VERIO FLEX SYSTEM W/DEVICE KIT: PACK | 0 refills | Status: DC

## 2022-06-06 ENCOUNTER — Other Ambulatory Visit: Payer: Self-pay

## 2022-06-06 MED ORDER — CILOSTAZOL 100 MG PO TABS: 100.0000 mg | ORAL_TABLET | Freq: Every day | ORAL | 1 refills | Status: DC

## 2022-06-06 MED ORDER — AMLODIPINE BESYLATE 5 MG PO TABS: 5.0000 mg | ORAL_TABLET | Freq: Every day | ORAL | 2 refills | Status: DC

## 2022-06-06 MED ORDER — HYDRALAZINE HCL 10 MG PO TABS: 10.0000 mg | ORAL_TABLET | Freq: Three times a day (TID) | ORAL | 2 refills | Status: DC

## 2022-06-06 MED ORDER — MAGNESIUM OXIDE 250 MG PO TABS: 1.0000 | ORAL_TABLET | Freq: Every day | ORAL | 2 refills | Status: DC

## 2022-06-06 MED ORDER — OLMESARTAN MEDOXOMIL 20 MG PO TABS: 20.0000 mg | ORAL_TABLET | Freq: Every day | ORAL | 2 refills | Status: DC

## 2022-06-06 MED ORDER — FUROSEMIDE 20 MG PO TABS: 20.0000 mg | ORAL_TABLET | Freq: Every day | ORAL | 3 refills | Status: DC

## 2022-06-20 ENCOUNTER — Ambulatory Visit (INDEPENDENT_AMBULATORY_CARE_PROVIDER_SITE_OTHER): Payer: Medicare Other

## 2022-06-20 ENCOUNTER — Ambulatory Visit (INDEPENDENT_AMBULATORY_CARE_PROVIDER_SITE_OTHER): Payer: Medicare Other | Admitting: Internal Medicine

## 2022-06-20 ENCOUNTER — Encounter: Payer: Self-pay | Admitting: Internal Medicine

## 2022-06-20 VITALS — BP 110/58 | HR 56 | Temp 97.7°F | Ht 62.0 in | Wt 140.0 lb

## 2022-06-20 DIAGNOSIS — E039 Hypothyroidism, unspecified: Secondary | ICD-10-CM

## 2022-06-20 DIAGNOSIS — I2510 Atherosclerotic heart disease of native coronary artery without angina pectoris: Secondary | ICD-10-CM | POA: Diagnosis not present

## 2022-06-20 DIAGNOSIS — Z0000 Encounter for general adult medical examination without abnormal findings: Secondary | ICD-10-CM

## 2022-06-20 DIAGNOSIS — I1310 Hypertensive heart and chronic kidney disease without heart failure, with stage 1 through stage 4 chronic kidney disease, or unspecified chronic kidney disease: Secondary | ICD-10-CM

## 2022-06-20 DIAGNOSIS — T466X5A Adverse effect of antihyperlipidemic and antiarteriosclerotic drugs, initial encounter: Secondary | ICD-10-CM

## 2022-06-20 DIAGNOSIS — Z6825 Body mass index (BMI) 25.0-25.9, adult: Secondary | ICD-10-CM

## 2022-06-20 DIAGNOSIS — E1122 Type 2 diabetes mellitus with diabetic chronic kidney disease: Secondary | ICD-10-CM | POA: Diagnosis not present

## 2022-06-20 DIAGNOSIS — G720 Drug-induced myopathy: Secondary | ICD-10-CM

## 2022-06-20 DIAGNOSIS — N1831 Chronic kidney disease, stage 3a: Secondary | ICD-10-CM | POA: Diagnosis not present

## 2022-06-20 DIAGNOSIS — E663 Overweight: Secondary | ICD-10-CM

## 2022-06-20 MED ORDER — AMLODIPINE BESYLATE 2.5 MG PO TABS: 2.5000 mg | ORAL_TABLET | Freq: Every day | ORAL | 2 refills | Status: DC

## 2022-06-20 MED ORDER — ISOSORBIDE MONONITRATE ER 60 MG PO TB24: 60.0000 mg | ORAL_TABLET | Freq: Every day | ORAL | 1 refills | Status: DC

## 2022-06-20 MED ORDER — MAGNESIUM GLYCINATE 100 MG PO CAPS: ORAL_CAPSULE | ORAL | 2 refills | Status: DC

## 2022-06-20 MED ORDER — LEVOTHYROXINE SODIUM 88 MCG PO TABS: ORAL_TABLET | ORAL | 1 refills | Status: DC

## 2022-06-20 NOTE — Progress Notes (Signed)
Subjective:   Ashika Bento is a 77 y.o. female who presents for Medicare Annual (Subsequent) preventive examination.  Review of Systems     Cardiac Risk Factors include: advanced age (>29men, >81 women);diabetes mellitus;dyslipidemia;hypertension     Objective:    Today's Vitals   06/20/22 1026  BP: (!) 110/58  Pulse: (!) 56  Temp: 97.7 F (36.5 C)  TempSrc: Oral  SpO2: 97%  Weight: 140 lb (63.5 kg)  Height: 5\' 2"  (1.575 m)   Body mass index is 25.61 kg/m.     06/20/2022   10:39 AM 05/31/2021   11:27 AM 05/26/2020   10:52 AM 05/27/2019   10:12 AM 05/21/2018   10:08 AM 12/24/2017    7:45 AM 12/15/2017    6:15 PM  Advanced Directives  Does Patient Have a Medical Advance Directive? No No No No No No No  Would patient like information on creating a medical advance directive?  Yes (MAU/Ambulatory/Procedural Areas - Information given) No - Patient declined Yes (MAU/Ambulatory/Procedural Areas - Information given) Yes (MAU/Ambulatory/Procedural Areas - Information given) No - Patient declined No - Patient declined    Current Medications (verified) Outpatient Encounter Medications as of 06/20/2022  Medication Sig   albuterol (PROVENTIL HFA;VENTOLIN HFA) 108 (90 Base) MCG/ACT inhaler Inhale 2 puffs into the lungs every 6 (six) hours as needed for wheezing or shortness of breath.   ALPHAGAN P 0.1 % SOLN    amLODipine (NORVASC) 5 MG tablet Take 1 tablet (5 mg total) by mouth at bedtime. 1/2 tab (Dr. Doylene Canard)   Apoaequorin (PREVAGEN PO) Take 1 tablet by mouth daily.   aspirin EC 81 MG tablet Take 81 mg by mouth daily.   b complex vitamins tablet Take 1 tablet by mouth daily with lunch.    Blood Glucose Monitoring Suppl (ONETOUCH VERIO FLEX SYSTEM) w/Device KIT USE AS DIRECTED TO CHECK BLOOD GLUCOSE 3 TIMES A DAY.   Carboxymethylcellulose Sodium (ARTIFICIAL TEARS OP) Apply to eye.   cholecalciferol (VITAMIN D) 25 MCG (1000 UNIT) tablet Take 1 tablet by mouth daily.    cilostazol  (PLETAL) 100 MG tablet Take 1 tablet (100 mg total) by mouth daily.   furosemide (LASIX) 20 MG tablet Take 1 tablet (20 mg total) by mouth daily.   glucose blood test strip Use as instructed   hydrALAZINE (APRESOLINE) 10 MG tablet Take 1 tablet (10 mg total) by mouth 3 (three) times daily with meals.   isosorbide mononitrate (IMDUR) 60 MG 24 hr tablet Take 1 tablet (60 mg total) by mouth daily.   levothyroxine (SYNTHROID) 88 MCG tablet TAKE 1 TABLET EVERY DAY BEFORE BREAKFAST   Magnesium Oxide (GNP MAGNESIUM OXIDE) 250 MG TABS Take 1 tablet (250 mg total) by mouth daily.   Multiple Vitamins-Minerals (MULTIVITAMIN WITH MINERALS) tablet Take 1 tablet by mouth daily with lunch.    nitroGLYCERIN (NITROSTAT) 0.4 MG SL tablet PLACE 1 TABLET UNDER THE TONGUE EVERY 5 MINUTES X 3 DOSES AS NEEDED FOR CHEST PAIN.   olmesartan (BENICAR) 20 MG tablet Take 1 tablet (20 mg total) by mouth daily.   Polyethyl Glycol-Propyl Glycol (SYSTANE OP) Apply to eye.   potassium chloride (KLOR-CON) 10 MEQ tablet Take 1 tablet (10 mEq total) by mouth 2 (two) times daily with breakfast and lunch.   REPATHA SURECLICK [id number] MG/ML SOAJ ADMINISTER 1 ML UNDER THE SKIN EVERY 14 DAYS   Semaglutide,0.25 or 0.5MG /DOS, (OZEMPIC, 0.25 OR 0.5 MG/DOSE,) 2 MG/3ML SOPN Inject 0.5mg  sq once weekly   Calcium Carb-Cholecalciferol (  CALCIUM + D3 PO) Take 1 tablet by mouth at bedtime. (Patient not taking: Reported on 06/20/2022)   No facility-administered encounter medications on file as of 06/20/2022.    Allergies (verified) Atorvastatin, Clopidogrel, Metoprolol, Metoprolol tartrate, Pregabalin, and Rosuvastatin calcium   History: Past Medical History:  Diagnosis Date   Benign hypertensive renal disease    CKD stage 2 due to type 2 diabetes mellitus (HCC)    Hypertension    Hypothyroidism    Obesity    OSA on CPAP    Pure hypercholesterolemia    PVD (peripheral vascular disease) ([place])    Vitamin D deficiency    Past Surgical History:   Procedure Laterality Date   ABDOMINAL HYSTERECTOMY     BREAST CYST EXCISION Right over 20 yrs   broken leg and displaced ankle     DILATION AND CURETTAGE OF UTERUS     LEFT HEART CATH AND CORONARY ANGIOGRAPHY N/A 06/13/2017   Procedure: LEFT HEART CATH AND CORONARY ANGIOGRAPHY;  Surgeon: Dixie Dials, MD;  Location: [facility];  Service: Cardiovascular;  Laterality: N/A;   right and left rotator cuff     THYROIDECTOMY     Family History  Problem Relation Age of Onset   Alzheimer's disease Mother    Aneurysm Father    Cancer Brother    Social History   Socioeconomic History   Marital status: Married    Spouse name: Not on file   Number of children: 3   Years of education: Not on file   Highest education level: Not on file  Occupational History   Occupation: retired  Tobacco Use   Smoking status: Never   Smokeless tobacco: Never  Vaping Use   Vaping Use: Never used  Substance and Sexual Activity   Alcohol use: No    Alcohol/week: 0.0 standard drinks of alcohol    Comment: occasional wine   Drug use: No   Sexual activity: Not Currently  Other Topics Concern   Not on file  Social History Narrative   Drinks 1-2 cups daily of coffee daily.   Social Determinants of Health   Financial Resource Strain: Low Risk  (06/20/2022)   Overall Financial Resource Strain (CARDIA)    Difficulty of Paying Living Expenses: Not hard at all  Food Insecurity: No Food Insecurity (06/20/2022)   Hunger Vital Sign    Worried About Running Out of Food in the Last Year: Never true    Ran Out of Food in the Last Year: Never true  Transportation Needs: No Transportation Needs (06/20/2022)   PRAPARE - Hydrologist (Medical): No    Lack of Transportation (Non-Medical): No  Physical Activity: Inactive (06/20/2022)   Exercise Vital Sign    Days of Exercise per Week: 0 days    Minutes of Exercise per Session: 0 min  Stress: No Stress Concern Present (06/20/2022)    [place]    Feeling of Stress : Not at all  Social Connections: Not on file    Tobacco Counseling Counseling given: Not Answered   Clinical Intake:  Pre-visit preparation completed: Yes  Pain : No/denies pain     Nutritional Status: BMI 25 -29 Overweight Nutritional Risks: None Diabetes: Yes  How often do you need to have someone help you when you read instructions, pamphlets, or other written materials from your doctor or pharmacy?: 1 - Never  Diabetic? Yes Nutrition Risk Assessment:  Has the patient  had any N/V/D within the last 2 months?  No  Does the patient have any non-healing wounds?  No  Has the patient had any unintentional weight loss or weight gain?  No   Diabetes:  Is the patient diabetic?  Yes  If diabetic, was a CBG obtained today?  No  Did the patient bring in their glucometer from home?  No  How often do you monitor your CBG's? daily.   Financial Strains and Diabetes Management:  Are you having any financial strains with the device, your supplies or your medication? No .  Does the patient want to be seen by Chronic Care Management for management of their diabetes?  No  Would the patient like to be referred to a Nutritionist or for Diabetic Management?  No   Diabetic Exams:  Diabetic Eye Exam: Completed 03/2022 Diabetic Foot Exam: Completed 10/11/2021   Interpreter Needed?: No  Information entered by :: NAllen LPN   Activities of Daily Living    06/20/2022   10:39 AM  In your present state of health, do you have any difficulty performing the following activities:  Hearing? 0  Vision? 0  Difficulty concentrating or making decisions? 0  Walking or climbing stairs? 0  Dressing or bathing? 0  Doing errands, shopping? 0  Preparing Food and eating ? N  Using the Toilet? N  In the past six months, have you accidently leaked urine? N  Do you have problems with loss of bowel  control? N  Managing your Medications? N  Managing your Finances? N  Housekeeping or managing your Housekeeping? N    Patient Care Team: Glendale Chard, MD as PCP - General (Internal Medicine) Marylynn Pearson, MD as Consulting Physician (Ophthalmology) Mayford Knife, [facility] (Pharmacist)  Indicate any recent Medical Services you may have received from other than [facility] providers in the past year (date may be approximate).     Assessment:   This is a routine wellness examination for [name].  Hearing/Vision screen Vision Screening - Comments:: Regular eye exams, Dr. Venetia Maxon  Dietary issues and exercise activities discussed:     Goals Addressed             This Visit's Progress    Patient Stated       06/20/2022, no goals       Depression Screen    06/20/2022   10:39 AM 05/31/2021   11:28 AM 05/26/2020   10:52 AM 09/30/2019   10:01 AM 05/27/2019   10:13 AM 02/12/2019    9:19 AM 11/13/2018    9:39 AM  PHQ 2/9 Scores  PHQ - 2 Score 0 0 0 0 0 0 0  PHQ- 9 Score    0 1 4 4     Fall Risk    06/20/2022   10:39 AM 05/31/2021   11:28 AM 05/26/2020   10:52 AM 05/27/2019   10:13 AM 09/23/2018    9:53 AM  Fall Risk   Falls in the past year? 0 0 0 0 0  Number falls in past yr: 0      Injury with Fall? 0      Risk for fall due to : Medication side effect Medication side effect Medication side effect Medication side effect   Follow up Falls prevention discussed;Education provided;Falls evaluation completed Falls evaluation completed;Education provided;Falls prevention discussed Falls evaluation completed;Education provided;Falls prevention discussed Falls evaluation completed;Education provided;Falls prevention discussed     FALL RISK PREVENTION PERTAINING TO THE HOME:  Any stairs in or  around the home? Yes  If so, are there any without handrails? Yes  Home free of loose throw rugs in walkways, pet beds, electrical cords, etc? Yes  Adequate lighting in your home to reduce risk of falls?  Yes   ASSISTIVE DEVICES UTILIZED TO PREVENT FALLS:  Life alert? No  Use of a cane, walker or w/c? No  Grab bars in the bathroom? No  Shower chair or bench in shower? No  Elevated toilet seat or a handicapped toilet? Yes   TIMED UP AND GO:  Was the test performed? Yes .  Length of time to ambulate 10 feet: 5 sec.   Gait steady and fast without use of assistive device  Cognitive Function:        06/20/2022   10:40 AM 05/31/2021   11:30 AM 05/26/2020   10:54 AM 05/27/2019   10:15 AM 05/21/2018   10:21 AM  6CIT Screen  What Year? 0 points 0 points 0 points 0 points 0 points  What month? 0 points 0 points 0 points 0 points 0 points  What time? 0 points 0 points 0 points 0 points 3 points  Count back from 20 0 points 0 points 0 points 0 points 0 points  Months in reverse 0 points 2 points 0 points 0 points 0 points  Repeat phrase 2 points 2 points 2 points 0 points 0 points  Total Score 2 points 4 points 2 points 0 points 3 points    Immunizations Immunization History  Administered Date(s) Administered   Fluad Quad(high Dose 65+) 02/08/2021, 02/12/2022   Influenza, High Dose Seasonal PF 03/30/2014, 01/31/2015, 04/04/2016, 12/18/2016, 11/13/2018, 01/04/2020   Influenza-Unspecified 01/24/2013, 12/21/2017   Moderna Covid-19 Vaccine Bivalent Booster 40yrs & up 06/20/2021   Moderna SARS-COV2 Booster Vaccination 02/05/2020   Moderna Sars-Covid-2 Vaccination 05/13/2019, 06/10/2019, 08/12/2020   PNEUMOCOCCAL CONJUGATE-20 02/23/2021   Pneumococcal Polysaccharide-23 12/27/2016   Tdap 04/13/2022   Unspecified SARS-COV-2 Vaccination 03/01/2022   Zoster Recombinat (Shingrix) 06/07/2021, 10/11/2021   Zoster, Live 02/03/2013    TDAP status: Up to date  Flu Vaccine status: Up to date  Pneumococcal vaccine status: Up to date  Covid-19 vaccine status: Completed vaccines  Qualifies for Shingles Vaccine? Yes   Zostavax completed Yes   Shingrix Completed?: Yes  Screening Tests Health  Maintenance  Topic Date Due   OPHTHALMOLOGY EXAM  03/15/2022   COVID-19 Vaccine (6 - 2023-24 season) 04/26/2022   Medicare Annual Wellness (AWV)  06/01/2022   Diabetic kidney evaluation - Urine ACR  07/28/2022   HEMOGLOBIN A1C  08/13/2022   FOOT EXAM  10/12/2022   Diabetic kidney evaluation - eGFR measurement  02/13/2023   DTaP/Tdap/Td (2 - Td or Tdap) 04/13/2032   Pneumonia Vaccine 21+ Years old  Completed   INFLUENZA VACCINE  Completed   DEXA SCAN  Completed   Hepatitis C Screening  Completed   Zoster Vaccines- Shingrix  Completed   HPV VACCINES  Aged Out   COLONOSCOPY (Pts 45-40yrs Insurance coverage will need to be confirmed)  Discontinued    Health Maintenance  Health Maintenance Due  Topic Date Due   OPHTHALMOLOGY EXAM  03/15/2022   COVID-19 Vaccine (6 - 2023-24 season) 04/26/2022   Medicare Annual Wellness (AWV)  06/01/2022    Colorectal cancer screening: No longer required.   Mammogram status: Completed 04/24/2022. Repeat every year  Bone Density status: Completed 08/02/2014.  Lung Cancer Screening: (Low Dose CT Chest recommended if Age 11-80 years, 30 pack-year currently smoking OR have quit  w/in 15years.) does not qualify.   Lung Cancer Screening Referral: no  Additional Screening:  Hepatitis C Screening: does qualify; Completed 04/03/2012  Vision Screening: Recommended annual ophthalmology exams for early detection of glaucoma and other disorders of the eye. Is the patient up to date with their annual eye exam?  Yes  Who is the provider or what is the name of the office in which the patient attends annual eye exams? Dr. Venetia Maxon If pt is not established with a provider, would they like to be referred to a provider to establish care? No .   Dental Screening: Recommended annual dental exams for proper oral hygiene  Community Resource Referral / Chronic Care Management: CRR required this visit?  No   CCM required this visit?  No      Plan:     I have  personally reviewed and noted the following in the patient's chart:   Medical and social history Use of alcohol, tobacco or illicit drugs  Current medications and supplements including opioid prescriptions. Patient is not currently taking opioid prescriptions. Functional ability and status Nutritional status Physical activity Advanced directives List of other physicians Hospitalizations, surgeries, and ER visits in previous 12 months Vitals Screenings to include cognitive, depression, and falls Referrals and appointments  In addition, I have reviewed and discussed with patient certain preventive protocols, quality metrics, and best practice recommendations. A written personalized care plan for preventive services as well as general preventive health recommendations were provided to patient.     [name], LPN   [id number]   Nurse Notes: none

## 2022-06-20 NOTE — Progress Notes (Signed)
Barnet Glasgow Martin,acting as a Education administrator for [name], MD.,have documented all relevant documentation on the behalf of [name], MD,as directed by  [name], MD while in the presence of [name], MD.    Subjective:     Patient ID: [name] , female    DOB: 1946/02/23 , 77 y.o.   MRN: [id number]   Chief Complaint  Patient presents with   Diabetes   Hypertension   Hypothyroidism    HPI  The patient is here today for a follow-up on her diabetes, blood pressure and thyroid check. She reports compliance with meds. She denies headaches, chest pain and shortness of breath.   AWV completed with [facility] Advisor, with [place] today.     Diabetes She presents for her follow-up diabetic visit. She has type 2 diabetes mellitus. Pertinent negatives for hypoglycemia include no headaches. Pertinent negatives for diabetes include no blurred vision, no polydipsia, no polyphagia and no polyuria. There are no hypoglycemic complications. Diabetic complications include heart disease and nephropathy. Risk factors for coronary artery disease include diabetes mellitus, dyslipidemia, hypertension and post-menopausal. She is following a diabetic diet. She participates in exercise intermittently. An ACE inhibitor/angiotensin II receptor blocker is being taken. Eye exam is current.  Hypertension This is a chronic problem. The current episode started more than 1 year ago. The problem has been gradually improving since onset. The problem is controlled. Pertinent negatives include no blurred vision or headaches. The current treatment provides moderate improvement. 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     Past Medical History:  Diagnosis Date   Benign hypertensive renal disease    CKD stage 2 due to type 2 diabetes mellitus (HCC)    Hypertension    Hypothyroidism    Obesity    OSA on CPAP    Pure hypercholesterolemia    PVD (peripheral vascular  disease) (HCC)    Vitamin D deficiency      Family History  Problem Relation Age of Onset   Alzheimer's disease Mother    Aneurysm Father    Cancer Brother      Current Outpatient Medications:    albuterol (PROVENTIL HFA;VENTOLIN HFA) 108 (90 Base) MCG/ACT inhaler, Inhale 2 puffs into the lungs every 6 (six) hours as needed for wheezing or shortness of breath., Disp: 1 Inhaler, Rfl: 2   ALPHAGAN P 0.1 % SOLN, , Disp: , Rfl:    amLODipine (NORVASC) 2.5 MG tablet, Take 1 tablet (2.5 mg total) by mouth daily., Disp: 90 tablet, Rfl: 2   Apoaequorin (PREVAGEN PO), Take 1 tablet by mouth daily., Disp: , Rfl:    aspirin EC 81 MG tablet, Take 81 mg by mouth daily., Disp: , Rfl:    b complex vitamins tablet, Take 1 tablet by mouth daily with lunch. , Disp: , Rfl:    Blood Glucose Monitoring Suppl (ONETOUCH VERIO FLEX SYSTEM) w/Device KIT, USE AS DIRECTED TO CHECK BLOOD GLUCOSE 3 TIMES A DAY., Disp: 1 kit, Rfl: 0   Carboxymethylcellulose Sodium (ARTIFICIAL TEARS OP), Apply to eye., Disp: , Rfl:    cholecalciferol (VITAMIN D) 25 MCG (1000 UNIT) tablet, Take 1 tablet by mouth daily. , Disp: , Rfl:    cilostazol (PLETAL) 100 MG tablet, Take 1 tablet (100 mg total) by mouth daily., Disp: 90 tablet, Rfl: 1   furosemide (LASIX) 20 MG tablet, Take 1 tablet (20 mg total) by mouth daily., Disp: 90 tablet, Rfl: 3   glucose blood test strip,  Use as instructed, Disp: 100 each, Rfl: 12   hydrALAZINE (APRESOLINE) 10 MG tablet, Take 1 tablet (10 mg total) by mouth 3 (three) times daily with meals., Disp: 90 tablet, Rfl: 2   Magnesium Glycinate 100 MG CAPS, One capsule po nightly, Disp: 90 capsule, Rfl: 2   Multiple Vitamins-Minerals (MULTIVITAMIN WITH MINERALS) tablet, Take 1 tablet by mouth daily with lunch. , Disp: , Rfl:    nitroGLYCERIN (NITROSTAT) 0.4 MG SL tablet, PLACE 1 TABLET UNDER THE TONGUE EVERY 5 MINUTES X 3 DOSES AS NEEDED FOR CHEST PAIN., Disp: 100 tablet, Rfl: 1   olmesartan (BENICAR) 20 MG  tablet, Take 1 tablet (20 mg total) by mouth daily., Disp: 90 tablet, Rfl: 2   Polyethyl Glycol-Propyl Glycol (SYSTANE OP), Apply to eye., Disp: , Rfl:    potassium chloride (KLOR-CON) 10 MEQ tablet, Take 1 tablet (10 mEq total) by mouth 2 (two) times daily with breakfast and lunch., Disp: 90 tablet, Rfl: 1   REPATHA SURECLICK [id number] MG/ML SOAJ, ADMINISTER 1 ML UNDER THE SKIN EVERY 14 DAYS, Disp: 6 mL, Rfl: 3   Semaglutide,0.25 or 0.5MG /DOS, (OZEMPIC, 0.25 OR 0.5 MG/DOSE,) 2 MG/3ML SOPN, Inject 0.5mg  sq once weekly, Disp: 3 mL, Rfl: 2   Calcium Carb-Cholecalciferol (CALCIUM + D3 PO), Take 1 tablet by mouth at bedtime. (Patient not taking: Reported on 06/20/2022), Disp: , Rfl:    isosorbide mononitrate (IMDUR) 60 MG 24 hr tablet, Take 1 tablet (60 mg total) by mouth daily., Disp: 90 tablet, Rfl: 1   levothyroxine (SYNTHROID) 88 MCG tablet, TAKE 1 TABLET EVERY DAY BEFORE BREAKFAST, Disp: 90 tablet, Rfl: 1   Allergies  Allergen Reactions   Atorvastatin Swelling and Other (See Comments)    Limbs swell   Clopidogrel Other (See Comments)    Caused bruising   Metoprolol Other (See Comments)    Slows heart rate too low   Metoprolol Tartrate Swelling and Other (See Comments)    Site of swelling not noted   Pregabalin Swelling and Other (See Comments)    Site of swelling not noted   Rosuvastatin Calcium Other (See Comments)    Unknown reaction     Review of Systems  Constitutional: Negative.   Eyes:  Negative for blurred vision.  Respiratory: Negative.    Cardiovascular: Negative.   Endocrine: Negative for polydipsia, polyphagia and polyuria.  Neurological: Negative.  Negative for headaches.  Psychiatric/Behavioral: Negative.       Today's Vitals   06/20/22 1100  BP: (!) 110/58  Pulse: (!) 56  Temp: 97.7 F (36.5 C)  TempSrc: Oral  Weight: 140 lb (63.5 kg)  Height: 5\' 2"  (1.575 m)  PainSc: 0-No pain   Body mass index is 25.61 kg/m.  BP Readings from Last 3 Encounters:  06/20/22 (!)  110/58  06/20/22 (!) 110/58  02/12/22 112/70   Wt Readings from Last 3 Encounters:  06/20/22 140 lb (63.5 kg)  06/20/22 140 lb (63.5 kg)  02/12/22 138 lb 3.2 oz (62.7 kg)    Objective:  Physical Exam Vitals and nursing note reviewed.  Constitutional:      Appearance: Normal appearance.  HENT:     Head: Normocephalic and atraumatic.     Nose:     Comments: Masked     Mouth/Throat:     Comments: Masked  Eyes:     Extraocular Movements: Extraocular movements intact.  Cardiovascular:     Rate and Rhythm: Normal rate and regular rhythm.     Heart sounds: Normal heart sounds.  Pulmonary:     Effort: Pulmonary effort is normal.     Breath sounds: Normal breath sounds.  Musculoskeletal:     Cervical back: Normal range of motion.  Skin:    General: Skin is warm.  Neurological:     General: No focal deficit present.     Mental Status: She is alert.  Psychiatric:        Mood and Affect: Mood normal.        Behavior: Behavior normal.     Assessment And Plan:     1. Type 2 diabetes mellitus with stage 3a chronic kidney disease, without long-term current use of insulin (HCC) Comments: Chronic, I will check DM and Renal labs as below. She is reminded to avoid NSAIDs, stay hydrated and keep BP/BS controlled to decrease risk of CKD progression. - PTH, intact and calcium - Phosphorus - Protein electrophoresis, serum - Hemoglobin A1c - CMP14+EGFR - Microalbumin / Creatinine Urine Ratio  2. Hypertensive heart and renal disease with renal failure, stage 1 through stage 4 or unspecified chronic kidney disease, without heart failure Comments: Chronic, controlled. She will c/w hydralazine 10mg  po tid, amlodipine 2.5mg  qd, olmesartan 20mg  daily. - CMP14+EGFR  3. CAD in native artery Comments: Chronic, LDL goal < 70. Cardiology input is appreciated. She will c/w Imdur 60mg , ASA 81mg  daily. She is not on BBlocker therapy.  4. Primary hypothyroidism Comments: Chronic, currently on  Synthroid daily. Last TSH was therapeutic, I will not check labs today.  5. Statin myopathy Comments: She is currently on Repatha, due to statin intolerance.  6. Overweight with body mass index (BMI) of 25 to 25.9 in adult Comments: She is encouraged to aim for at least 150 minutes of exercise/week.   Patient was given opportunity to ask questions. Patient verbalized understanding of the plan and was able to repeat key elements of the plan. All questions were answered to their satisfaction.   I, [name], MD, have reviewed all documentation for this visit. The documentation on 06/20/22 for the exam, diagnosis, procedures, and orders are all accurate and complete.   IF YOU HAVE BEEN REFERRED TO A SPECIALIST, IT MAY TAKE 1-2 WEEKS TO SCHEDULE/PROCESS THE REFERRAL. IF YOU HAVE NOT HEARD FROM US/SPECIALIST IN TWO WEEKS, PLEASE GIVE [place] A CALL AT [phone] X [phone].   THE PATIENT IS ENCOURAGED TO PRACTICE SOCIAL DISTANCING DUE TO THE COVID-19 PANDEMIC.

## 2022-06-20 NOTE — Patient Instructions (Addendum)
Magnesium glycinate - one nightly  Type 2 Diabetes Mellitus, Diagnosis, Adult Type 2 diabetes (type 2 diabetes mellitus) is a long-term (chronic) disease. It may happen when there is one or both of these problems: The pancreas does not make enough insulin. The body does not react in a normal way to insulin that it makes. Insulin lets sugars go into cells in your body. If you have type 2 diabetes, sugars cannot get into your cells. Sugars build up in the blood. This causes high blood sugar. What are the causes? The exact cause of this condition is not known. What increases the risk? Having type 2 diabetes in your family. Being overweight or very overweight. Not being active. Your body not reacting in a normal way to the insulin it makes. Having higher than normal blood sugar over time. Having a type of diabetes when you were pregnant. Having a condition that causes small fluid-filled sacs on your ovaries. What are the signs or symptoms? At first, you may have no symptoms. You will get symptoms slowly. They may include: More thirst than normal. More hunger than normal. Needing to pee more than normal. Losing weight without trying. Feeling tired. Feeling weak. Seeing things blurry. Dark patches on your skin. How is this treated? This condition may be treated by a diabetes expert. You may need to: Follow an eating plan made by a food expert (dietitian). Get regular exercise. Find ways to deal with stress. Check blood sugar as often as told. Take medicines. Your doctor will set treatment goals for you. Your blood sugar should be at these levels: Before meals: 80-130 mg/dL (4.4-7.2 mmol/L). After meals: below 180 mg/dL (10 mmol/L). Over the last 2-3 months: less than 7%. Follow these instructions at home: Medicines Take your diabetes medicines or insulin every day. Take medicines as told to help you prevent other problems caused by this condition. You may need: Aspirin. Medicine  to lower cholesterol. Medicine to control blood pressure. Questions to ask your doctor Should I meet with a diabetes educator? What medicines do I need, and when should I take them? What will I need to treat my condition at home? When should I check my blood sugar? Where can I find a support group? Who can I call if I have questions? When is my next doctor visit? General instructions Take over-the-counter and prescription medicines only as told by your doctor. Keep all follow-up visits. Where to find more information For help and guidance and more information about diabetes, please go to: American Diabetes Association (ADA): www.diabetes.org American Association of Diabetes Care and Education Specialists (ADCES): www.diabeteseducator.org International Diabetes Federation (IDF): MemberVerification.ca Contact a doctor if: Your blood sugar is at or above 240 mg/dL (13.3 mmol/L) for 2 days in a row. You have been sick for 2 days or more, and you are not getting better. You have had a fever for 2 days or more, and you are not getting better. You have any of these problems for more than 6 hours: You cannot eat or drink. You feel like you may vomit. You vomit. You have watery poop (diarrhea). Get help right away if: Your blood sugar is lower than 54 mg/dL (3 mmol/L). You feel mixed up (confused). You have trouble thinking clearly. You have trouble breathing. You have medium or large ketone levels in your pee. These symptoms may be an emergency. Get help right away. Call your local emergency services (911 in the [place] Do not wait to see if the symptoms will  go away. Do not drive yourself to the hospital. Summary Type 2 diabetes is a long-term disease. Your pancreas may not make enough insulin, or your body may not react in a normal way to insulin that it makes. This condition is treated with an eating plan, lifestyle changes, and medicines. Your doctor will set treatment goals for you. These  will help you keep your blood sugar in a healthy range. Keep all follow-up visits. This information is not intended to replace advice given to you by your health care provider. Make sure you discuss any questions you have with your health care provider. Document Revised: 06/06/2020 Document Reviewed: 06/06/2020 Elsevier Patient Education  [place].

## 2022-06-20 NOTE — Patient Instructions (Signed)
[name] , Thank you for taking time to come for your Medicare Wellness Visit. I appreciate your ongoing commitment to your health goals. Please review the following plan we discussed and let me know if I can assist you in the future.   These are the goals we discussed:  Goals       LDL CALC < 70      Manage My Medicine      Timeframe:  Long-Range Goal Priority:  High Start Date:                             Expected End Date:                       Follow Up Date 08/16/2021   In Progress:  - call for medicine refill 2 or 3 days before it runs out - call if I am sick and can't take my medicine - use a pillbox to sort medicine    Why is this important?   These steps will help you keep on track with your medicines. Call if you have any questions about your medications.          Patient Stated (pt-stated)      Wants to get off of medicines      Patient Stated      05/26/2020, get off medications      Patient Stated      05/31/2021, wants to lose 4 pounds      Patient Stated      06/20/2022, no goals      Weight (lb) < 200 lb (90.7 kg)      05/27/2019, wants to get down to 150 pounds and get off some medications        This is a list of the screening recommended for you and due dates:  Health Maintenance  Topic Date Due   Eye exam for diabetics  03/15/2022   COVID-19 Vaccine (6 - 2023-24 season) 04/26/2022   Yearly kidney health urinalysis for diabetes  07/28/2022   Hemoglobin A1C  08/13/2022   Complete foot exam   10/12/2022   Yearly kidney function blood test for diabetes  02/13/2023   Medicare Annual Wellness Visit  06/20/2023   DTaP/Tdap/Td vaccine (2 - Td or Tdap) 04/13/2032   Pneumonia Vaccine  Completed   Flu Shot  Completed   DEXA scan (bone density measurement)  Completed   Hepatitis C Screening: USPSTF Recommendation to screen - Ages 94-79 yo.  Completed   Zoster (Shingles) Vaccine  Completed   HPV Vaccine  Aged Out   Colon Cancer Screening  Discontinued     Advanced directives: Advance directive discussed with you today. Even though you declined this today please call our office should you change your mind and we can give you the proper paperwork for you to fill out.  Conditions/risks identified: none  Next appointment: Follow up in one year for your annual wellness visit    Preventive Care 65 Years and Older, Female Preventive care refers to lifestyle choices and visits with your health care provider that can promote health and wellness. What does preventive care include? A yearly physical exam. This is also called an annual well check. Dental exams once or twice a year. Routine eye exams. Ask your health care provider how often you should have your eyes checked. Personal lifestyle choices, including: Daily care of your teeth and gums.  Regular physical activity. Eating a healthy diet. Avoiding tobacco and drug use. Limiting alcohol use. Practicing safe sex. Taking low-dose aspirin every day. Taking vitamin and mineral supplements as recommended by your health care provider. What happens during an annual well check? The services and screenings done by your health care provider during your annual well check will depend on your age, overall health, lifestyle risk factors, and family history of disease. Counseling  Your health care provider may ask you questions about your: Alcohol use. Tobacco use. Drug use. Emotional well-being. Home and relationship well-being. Sexual activity. Eating habits. History of falls. Memory and ability to understand (cognition). Work and work Statistician. Reproductive health. Screening  You may have the following tests or measurements: Height, weight, and BMI. Blood pressure. Lipid and cholesterol levels. These may be checked every 5 years, or more frequently if you are over 13 years old. Skin check. Lung cancer screening. You may have this screening every year starting at age 54 if you have a  30-pack-year history of smoking and currently smoke or have quit within the past 15 years. Fecal occult blood test (FOBT) of the stool. You may have this test every year starting at age 77. Flexible sigmoidoscopy or colonoscopy. You may have a sigmoidoscopy every 5 years or a colonoscopy every 10 years starting at age 79. Hepatitis C blood test. Hepatitis B blood test. Sexually transmitted disease (STD) testing. Diabetes screening. This is done by checking your blood sugar (glucose) after you have not eaten for a while (fasting). You may have this done every 1-3 years. Bone density scan. This is done to screen for osteoporosis. You may have this done starting at age 45. Mammogram. This may be done every 1-2 years. Talk to your health care provider about how often you should have regular mammograms. Talk with your health care provider about your test results, treatment options, and if necessary, the need for more tests. Vaccines  Your health care provider may recommend certain vaccines, such as: Influenza vaccine. This is recommended every year. Tetanus, diphtheria, and acellular pertussis (Tdap, Td) vaccine. You may need a Td booster every 10 years. Zoster vaccine. You may need this after age 80. Pneumococcal 13-valent conjugate (PCV13) vaccine. One dose is recommended after age 8. Pneumococcal polysaccharide (PPSV23) vaccine. One dose is recommended after age 57. Talk to your health care provider about which screenings and vaccines you need and how often you need them. This information is not intended to replace advice given to you by your health care provider. Make sure you discuss any questions you have with your health care provider. Document Released: 04/08/2015 Document Revised: 11/30/2015 Document Reviewed: 01/11/2015 Elsevier Interactive Patient Education  2017 [place] Prevention in the Home Falls can cause injuries. They can happen to people of all ages. There are many  things you can do to make your home safe and to help prevent falls. What can I do on the outside of my home? Regularly fix the edges of walkways and driveways and fix any cracks. Remove anything that might make you trip as you walk through a door, such as a raised step or threshold. Trim any bushes or trees on the path to your home. Use bright outdoor lighting. Clear any walking paths of anything that might make someone trip, such as rocks or tools. Regularly check to see if handrails are loose or broken. Make sure that both sides of any steps have handrails. Any raised decks and porches should have  guardrails on the edges. Have any leaves, snow, or ice cleared regularly. Use sand or salt on walking paths during winter. Clean up any spills in your garage right away. This includes oil or grease spills. What can I do in the bathroom? Use night lights. Install grab bars by the toilet and in the tub and shower. Do not use towel bars as grab bars. Use non-skid mats or decals in the tub or shower. If you need to sit down in the shower, use a plastic, non-slip stool. Keep the floor dry. Clean up any water that spills on the floor as soon as it happens. Remove soap buildup in the tub or shower regularly. Attach bath mats securely with double-sided non-slip rug tape. Do not have throw rugs and other things on the floor that can make you trip. What can I do in the bedroom? Use night lights. Make sure that you have a light by your bed that is easy to reach. Do not use any sheets or blankets that are too big for your bed. They should not hang down onto the floor. Have a firm chair that has side arms. You can use this for support while you get dressed. Do not have throw rugs and other things on the floor that can make you trip. What can I do in the kitchen? Clean up any spills right away. Avoid walking on wet floors. Keep items that you use a lot in easy-to-reach places. If you need to reach  something above you, use a strong step stool that has a grab bar. Keep electrical cords out of the way. Do not use floor polish or wax that makes floors slippery. If you must use wax, use non-skid floor wax. Do not have throw rugs and other things on the floor that can make you trip. What can I do with my stairs? Do not leave any items on the stairs. Make sure that there are handrails on both sides of the stairs and use them. Fix handrails that are broken or loose. Make sure that handrails are as long as the stairways. Check any carpeting to make sure that it is firmly attached to the stairs. Fix any carpet that is loose or worn. Avoid having throw rugs at the top or bottom of the stairs. If you do have throw rugs, attach them to the floor with carpet tape. Make sure that you have a light switch at the top of the stairs and the bottom of the stairs. If you do not have them, ask someone to add them for you. What else can I do to help prevent falls? Wear shoes that: Do not have high heels. Have rubber bottoms. Are comfortable and fit you well. Are closed at the toe. Do not wear sandals. If you use a stepladder: Make sure that it is fully opened. Do not climb a closed stepladder. Make sure that both sides of the stepladder are locked into place. Ask someone to hold it for you, if possible. Clearly mark and make sure that you can see: Any grab bars or handrails. First and last steps. Where the edge of each step is. Use tools that help you move around (mobility aids) if they are needed. These include: Canes. Walkers. Scooters. Crutches. Turn on the lights when you go into a dark area. Replace any light bulbs as soon as they burn out. Set up your furniture so you have a clear path. Avoid moving your furniture around. If any of your floors  are uneven, fix them. If there are any pets around you, be aware of where they are. Review your medicines with your doctor. Some medicines can make you  feel dizzy. This can increase your chance of falling. Ask your doctor what other things that you can do to help prevent falls. This information is not intended to replace advice given to you by your health care provider. Make sure you discuss any questions you have with your health care provider. Document Released: 01/06/2009 Document Revised: 08/18/2015 Document Reviewed: 04/16/2014 Elsevier Interactive Patient Education  2017 [employer].

## 2022-06-25 LAB — CMP14+EGFR
ALT: 19 IU/L (ref 0–32)
AST: 43 IU/L — ABNORMAL HIGH (ref 0–40)
Albumin/Globulin Ratio: 2.4 — ABNORMAL HIGH (ref 1.2–2.2)
Albumin: 4.6 g/dL (ref 3.8–4.8)
Alkaline Phosphatase: 60 IU/L (ref 44–121)
BUN/Creatinine Ratio: 14 (ref 12–28)
BUN: 12 mg/dL (ref 8–27)
Bilirubin Total: 0.3 mg/dL (ref 0.0–1.2)
CO2: 27 mmol/L (ref 20–29)
Calcium: 9.9 mg/dL (ref 8.7–10.3)
Chloride: 103 mmol/L (ref 96–106)
Creatinine, Ser: 0.87 mg/dL (ref 0.57–1.00)
Globulin, Total: 1.9 g/dL (ref 1.5–4.5)
Glucose: 50 mg/dL — ABNORMAL LOW (ref 70–99)
Potassium: 4.7 mmol/L (ref 3.5–5.2)
Sodium: 139 mmol/L (ref 134–144)
Total Protein: 6.5 g/dL (ref 6.0–8.5)
eGFR: 69 mL/min/{1.73_m2} (ref >59)

## 2022-06-25 LAB — MICROALBUMIN / CREATININE URINE RATIO
Creatinine, Urine: 30.1 mg/dL
Microalb/Creat Ratio: <10 mg/g creat (ref 0–29)
Microalbumin, Urine: <3 ug/mL

## 2022-06-25 LAB — PROTEIN ELECTROPHORESIS, SERUM
A/G Ratio: 1.8 — ABNORMAL HIGH (ref 0.7–1.7)
Albumin ELP: 4.2 g/dL (ref 2.9–4.4)
Alpha 1: 0.2 g/dL (ref 0.0–0.4)
Alpha 2: 0.5 g/dL (ref 0.4–1.0)
Beta: 0.7 g/dL (ref 0.7–1.3)
Gamma Globulin: 0.9 g/dL (ref 0.4–1.8)
Globulin, Total: 2.3 g/dL (ref 2.2–3.9)

## 2022-06-25 LAB — HEMOGLOBIN A1C
Est. average glucose Bld gHb Est-mCnc: 120 mg/dL
Hgb A1c MFr Bld: 5.8 % — ABNORMAL HIGH (ref 4.8–5.6)

## 2022-06-25 LAB — PHOSPHORUS: Phosphorus: 3.6 mg/dL (ref 3.0–4.3)

## 2022-06-25 LAB — PTH, INTACT AND CALCIUM: PTH: 33 pg/mL (ref 15–65)

## 2022-07-01 DIAGNOSIS — E663 Overweight: Secondary | ICD-10-CM | POA: Insufficient documentation

## 2022-07-02 ENCOUNTER — Other Ambulatory Visit: Payer: Self-pay | Admitting: Internal Medicine

## 2022-07-11 ENCOUNTER — Other Ambulatory Visit: Payer: Self-pay

## 2022-07-11 MED ORDER — POTASSIUM CHLORIDE ER 10 MEQ PO TBCR: EXTENDED_RELEASE_TABLET | ORAL | 3 refills | Status: DC

## 2022-07-11 MED ORDER — MAGNESIUM GLYCINATE 100 MG PO CAPS: ORAL_CAPSULE | ORAL | 2 refills | Status: DC

## 2022-07-11 MED ORDER — FUROSEMIDE 20 MG PO TABS: 20.0000 mg | ORAL_TABLET | Freq: Every day | ORAL | 3 refills | Status: DC

## 2022-08-12 ENCOUNTER — Other Ambulatory Visit: Payer: Self-pay | Admitting: Internal Medicine

## 2022-08-23 ENCOUNTER — Telehealth: Payer: Self-pay

## 2022-08-23 NOTE — Telephone Encounter (Signed)
Patient notified that her ozempic has been delivered to the office and she can come pick it up. YL,RMA

## 2022-08-31 ENCOUNTER — Telehealth: Payer: Self-pay

## 2022-08-31 NOTE — Telephone Encounter (Signed)
Call to pt to discuss starting PREP Female answered Asked me to call back at a later time-busy on other line

## 2022-09-04 ENCOUNTER — Telehealth: Payer: Self-pay

## 2022-09-04 NOTE — Telephone Encounter (Signed)
Per Tamela..[employer] reorder form for Ozempic 0.5 mg. Marijo Sanes

## 2022-10-22 ENCOUNTER — Ambulatory Visit (INDEPENDENT_AMBULATORY_CARE_PROVIDER_SITE_OTHER): Payer: Medicare Other | Admitting: Internal Medicine

## 2022-10-22 ENCOUNTER — Encounter: Payer: Self-pay | Admitting: Internal Medicine

## 2022-10-22 VITALS — BP 100/60 | HR 60 | Temp 98.5°F | Ht 62.0 in | Wt 134.2 lb

## 2022-10-22 DIAGNOSIS — I1310 Hypertensive heart and chronic kidney disease without heart failure, with stage 1 through stage 4 chronic kidney disease, or unspecified chronic kidney disease: Secondary | ICD-10-CM | POA: Diagnosis not present

## 2022-10-22 DIAGNOSIS — E7800 Pure hypercholesterolemia, unspecified: Secondary | ICD-10-CM

## 2022-10-22 DIAGNOSIS — E2839 Other primary ovarian failure: Secondary | ICD-10-CM

## 2022-10-22 DIAGNOSIS — G720 Drug-induced myopathy: Secondary | ICD-10-CM

## 2022-10-22 DIAGNOSIS — I739 Peripheral vascular disease, unspecified: Secondary | ICD-10-CM | POA: Diagnosis not present

## 2022-10-22 DIAGNOSIS — Z6824 Body mass index (BMI) 24.0-24.9, adult: Secondary | ICD-10-CM

## 2022-10-22 DIAGNOSIS — E1122 Type 2 diabetes mellitus with diabetic chronic kidney disease: Secondary | ICD-10-CM | POA: Diagnosis not present

## 2022-10-22 DIAGNOSIS — Z0000 Encounter for general adult medical examination without abnormal findings: Secondary | ICD-10-CM

## 2022-10-22 DIAGNOSIS — I2510 Atherosclerotic heart disease of native coronary artery without angina pectoris: Secondary | ICD-10-CM | POA: Diagnosis not present

## 2022-10-22 DIAGNOSIS — E039 Hypothyroidism, unspecified: Secondary | ICD-10-CM

## 2022-10-22 DIAGNOSIS — N1831 Chronic kidney disease, stage 3a: Secondary | ICD-10-CM

## 2022-10-22 DIAGNOSIS — T466X5A Adverse effect of antihyperlipidemic and antiarteriosclerotic drugs, initial encounter: Secondary | ICD-10-CM

## 2022-10-22 LAB — HM DIABETES EYE EXAM

## 2022-10-22 MED ORDER — CILOSTAZOL 100 MG PO TABS: 100.0000 mg | ORAL_TABLET | Freq: Every day | ORAL | 2 refills | Status: DC

## 2022-10-22 NOTE — Assessment & Plan Note (Signed)
Chronic, I will check thyroid panel and adjust meds as needed. She will continue with Synthroid M-F for now.

## 2022-10-22 NOTE — Assessment & Plan Note (Signed)
Chronic , reminded to engage in regular exercise. She will c/w ASA and cilostazol. She is not on statin therapy due to myopathy. She is currently on Repatha.

## 2022-10-22 NOTE — Patient Instructions (Signed)

## 2022-10-22 NOTE — Assessment & Plan Note (Signed)
Chronic, she is also followed by Cardiology, Dr. Algie Coffer.  Importance of medication/dietary compliance was discussed with the patient. She is on ASA, isosorbide and ARB therapy.   She is encouraged to follow heart healthy lifestyle.

## 2022-10-22 NOTE — Assessment & Plan Note (Addendum)
Chronic, well controlled. EKG performed, SB w/ RBBB, RAE. She will c/w furosemide 20mf daily, amlodipine 2.5mg  daily and olmesartan 20mg  daily. She is encouraged to follow low sodium diet.

## 2022-10-22 NOTE — Assessment & Plan Note (Signed)
Chronic, LDL goal < 70.   She is currently on Repatha, she has not had any issues with the medication.

## 2022-10-22 NOTE — Progress Notes (Signed)
I,Jameka J Llittleton, CMA,acting as a Neurosurgeon for Catherine Aliment, MD.,have documented all relevant documentation on the behalf of Catherine Aliment, MD,as directed by  Catherine Aliment, MD while in the presence of Catherine Aliment, MD.  Subjective:    Patient ID: [name] , female    DOB: 01/28/1946 , 77 y.o.   MRN: [id number]  Chief Complaint  Patient presents with   Annual Exam    HPI  The patient is here today for a physical exam. She reports compliance with meds. She denies headaches, chest pain and palpitations. Patient reports she is scheduled for her eye exam on 11/05/22 with Dr.Whitaker.  She brought in her BS log - fasting sugars range from 84-123.  Diabetes She presents for her follow-up diabetic visit. She has type 2 diabetes mellitus. Pertinent negatives for diabetes include no blurred vision. There are no hypoglycemic complications. Diabetic complications include heart disease and nephropathy. Risk factors for coronary artery disease include diabetes mellitus, dyslipidemia, hypertension and post-menopausal. She is following a diabetic diet. She participates in exercise intermittently. An ACE inhibitor/angiotensin II receptor blocker is being taken. Eye exam is current.  Hypertension This is a chronic problem. The current episode started more than 1 year ago. The problem has been gradually improving since onset. The problem is controlled. Pertinent negatives include no blurred vision. The current treatment provides moderate improvement. 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     Past Medical History:  Diagnosis Date   Benign hypertensive renal disease    CKD stage 2 due to type 2 diabetes mellitus (HCC)    Hypertension    Hypothyroidism    Obesity    OSA on CPAP    Pure hypercholesterolemia    PVD (peripheral vascular disease) (HCC)    Vitamin D deficiency      Family History  Problem Relation Age of Onset   Alzheimer's disease Mother     Aneurysm Father    Cancer Brother      Current Outpatient Medications:    albuterol (PROVENTIL HFA;VENTOLIN HFA) 108 (90 Base) MCG/ACT inhaler, Inhale 2 puffs into the lungs every 6 (six) hours as needed for wheezing or shortness of breath., Disp: 1 Inhaler, Rfl: 2   ALPHAGAN P 0.1 % SOLN, , Disp: , Rfl:    amLODipine (NORVASC) 2.5 MG tablet, Take 1 tablet (2.5 mg total) by mouth daily., Disp: 90 tablet, Rfl: 2   Apoaequorin (PREVAGEN PO), Take 1 tablet by mouth daily., Disp: , Rfl:    aspirin EC 81 MG tablet, Take 81 mg by mouth daily., Disp: , Rfl:    b complex vitamins tablet, Take 1 tablet by mouth daily with lunch. , Disp: , Rfl:    Blood Glucose Monitoring Suppl (ONETOUCH VERIO FLEX SYSTEM) w/Device KIT, USE AS DIRECTED TO CHECK BLOOD GLUCOSE 3 TIMES A DAY., Disp: 1 kit, Rfl: 0   Calcium Carb-Cholecalciferol (CALCIUM + D3 PO), Take 1 tablet by mouth at bedtime., Disp: , Rfl:    Carboxymethylcellulose Sodium (ARTIFICIAL TEARS OP), Apply to eye., Disp: , Rfl:    cholecalciferol (VITAMIN D) 25 MCG (1000 UNIT) tablet, Take 1 tablet by mouth daily. , Disp: , Rfl:    furosemide (LASIX) 20 MG tablet, Take 1 tablet (20 mg total) by mouth daily., Disp: 90 tablet, Rfl: 3   glucose blood test strip, Use as instructed, Disp: 100 each, Rfl: 12   hydrALAZINE (APRESOLINE) 10 MG tablet, TAKE 1 TABLET BY MOUTH 3 TIMES  DAILY WITH MEALS, Disp: 270 tablet, Rfl: 3   isosorbide mononitrate (IMDUR) 60 MG 24 hr tablet, Take 1 tablet (60 mg total) by mouth daily., Disp: 90 tablet, Rfl: 1   levothyroxine (SYNTHROID) 88 MCG tablet, TAKE 1 TABLET EVERY DAY BEFORE BREAKFAST, Disp: 90 tablet, Rfl: 1   Multiple Vitamins-Minerals (MULTIVITAMIN WITH MINERALS) tablet, Take 1 tablet by mouth daily with lunch. , Disp: , Rfl:    nitroGLYCERIN (NITROSTAT) 0.4 MG SL tablet, PLACE 1 TABLET UNDER THE TONGUE EVERY 5 MINUTES X 3 DOSES AS NEEDED FOR CHEST PAIN., Disp: 100 tablet, Rfl: 1   olmesartan (BENICAR) 20 MG tablet,  Take 1 tablet (20 mg total) by mouth daily., Disp: 90 tablet, Rfl: 2   Polyethyl Glycol-Propyl Glycol (SYSTANE OP), Apply to eye., Disp: , Rfl:    potassium chloride (KLOR-CON) 10 MEQ tablet, TAKE 1 TABLET BY MOUTH TWICE  DAILY WITH BREAKFAST AND LUNCH, Disp: 180 tablet, Rfl: 3   REPATHA SURECLICK 140 MG/ML SOAJ, ADMINISTER 1 ML UNDER THE SKIN EVERY 14 DAYS, Disp: 6 mL, Rfl: 3   Semaglutide,0.25 or 0.5MG /DOS, (OZEMPIC, 0.25 OR 0.5 MG/DOSE,) 2 MG/3ML SOPN, Inject 0.5mg  sq once weekly, Disp: 3 mL, Rfl: 2   cilostazol (PLETAL) 100 MG tablet, Take 1 tablet (100 mg total) by mouth daily., Disp: 90 tablet, Rfl: 2   Magnesium Glycinate 100 MG CAPS, One capsule po nightly (Patient not taking: Reported on 10/22/2022), Disp: 90 capsule, Rfl: 2   Allergies  Allergen Reactions   Atorvastatin Swelling and Other (See Comments)    Limbs swell   Clopidogrel Other (See Comments)    Caused bruising   Metoprolol Other (See Comments)    Slows heart rate too low   Metoprolol Tartrate Swelling and Other (See Comments)    Site of swelling not noted   Pregabalin Swelling and Other (See Comments)    Site of swelling not noted   Rosuvastatin Calcium Other (See Comments)    Unknown reaction      The patient states she uses post menopausal status for birth control. No LMP recorded. Patient has had a hysterectomy.. Negative for Dysmenorrhea. Negative for: breast discharge, breast lump(s), breast pain and breast self exam. Associated symptoms include abnormal vaginal bleeding. Pertinent negatives include abnormal bleeding (hematology), anxiety, decreased libido, depression, difficulty falling sleep, dyspareunia, history of infertility, nocturia, sexual dysfunction, sleep disturbances, urinary incontinence, urinary urgency, vaginal discharge and vaginal itching. Diet regular.The patient states her exercise level is  intermittent, likes to exercise inside her home.   . The patient's tobacco use is:  Social History    Tobacco Use  Smoking Status Never  Smokeless Tobacco Never  . She has been exposed to passive smoke. The patient's alcohol use is:  Social History   Substance and Sexual Activity  Alcohol Use No   Alcohol/week: 0.0 standard drinks of alcohol   Comment: occasional wine     Review of Systems  Constitutional: Negative.   HENT: Negative.    Eyes: Negative.  Negative for blurred vision.  Respiratory: Negative.    Cardiovascular: Negative.   Gastrointestinal: Negative.   Endocrine: Negative.   Genitourinary: Negative.   Musculoskeletal: Negative.   Skin: Negative.   Allergic/Immunologic: Negative.   Neurological: Negative.   Hematological: Negative.   Psychiatric/Behavioral: Negative.       Today's Vitals   10/22/22 1008 10/22/22 1024  BP: 138/60 100/60  Pulse: 60   Temp: 98.5 F (36.9 C)   Weight: 134 lb 3.2 oz (60.9 kg)  Height: 5\' 2"  (1.575 m)   PainSc: 0-No pain    Body mass index is 24.55 kg/m.  Wt Readings from Last 3 Encounters:  10/22/22 134 lb 3.2 oz (60.9 kg)  06/20/22 140 lb (63.5 kg)  06/20/22 140 lb (63.5 kg)     Objective:  Physical Exam Vitals and nursing note reviewed.  Constitutional:      Appearance: Normal appearance.  HENT:     Head: Normocephalic and atraumatic.     Right Ear: Tympanic membrane, ear canal and external ear normal.     Left Ear: Tympanic membrane, ear canal and external ear normal.     Nose: Nose normal.     Mouth/Throat:     Mouth: Mucous membranes are moist.     Pharynx: Oropharynx is clear.  Eyes:     Extraocular Movements: Extraocular movements intact.     Conjunctiva/sclera: Conjunctivae normal.     Pupils: Pupils are equal, round, and reactive to light.  Cardiovascular:     Rate and Rhythm: Normal rate and regular rhythm.     Pulses: Normal pulses.          Dorsalis pedis pulses are 2+ on the right side and 2+ on the left side.     Heart sounds: Murmur heard.  Pulmonary:     Effort: Pulmonary effort is  normal.     Breath sounds: Normal breath sounds.  Abdominal:     General: Abdomen is flat. Bowel sounds are normal.     Palpations: Abdomen is soft.  Genitourinary:    Comments: deferred Musculoskeletal:        General: Normal range of motion.     Cervical back: Normal range of motion and neck supple.  Feet:     Right foot:     Protective Sensation: 5 sites tested.  5 sites sensed.     Skin integrity: Dry skin present.     Toenail Condition: Right toenails are normal.     Left foot:     Protective Sensation: 5 sites tested.  5 sites sensed.     Skin integrity: Dry skin present.     Toenail Condition: Left toenails are normal.  Skin:    General: Skin is warm and dry.     Comments: Scab in middle of back, no surrounding erythema. No vesicular lesions noted  Neurological:     General: No focal deficit present.     Mental Status: She is alert and oriented to person, place, and time.  Psychiatric:        Mood and Affect: Mood normal.        Behavior: Behavior normal.         Assessment And Plan:     Encounter for general adult medical examination w/o abnormal findings Assessment & Plan: A full exam was performed. Importance of monthly self breast exams was discussed with the patient.PATIENT IS ADVISED TO GET 30-45 MINUTES REGULAR EXERCISE NO LESS THAN FOUR TO FIVE DAYS PER WEEK - BOTH WEIGHTBEARING EXERCISES AND AEROBIC ARE RECOMMENDED.  PATIENT IS ADVISED TO FOLLOW A HEALTHY DIET WITH AT LEAST SIX FRUITS/VEGGIES PER DAY, DECREASE INTAKE OF RED MEAT, AND TO INCREASE FISH INTAKE TO TWO DAYS PER WEEK.  MEATS/FISH SHOULD NOT BE FRIED, BAKED OR BROILED IS PREFERABLE.  IT IS ALSO IMPORTANT TO CUT BACK ON YOUR SUGAR INTAKE. PLEASE AVOID ANYTHING WITH ADDED SUGAR, CORN SYRUP OR OTHER SWEETENERS. IF YOU MUST USE A SWEETENER, YOU CAN TRY STEVIA. IT IS ALSO IMPORTANT  TO AVOID ARTIFICIALLY SWEETENERS AND DIET BEVERAGES. LASTLY, I SUGGEST WEARING SPF 50 SUNSCREEN ON EXPOSED PARTS AND ESPECIALLY  WHEN IN THE DIRECT SUNLIGHT FOR AN EXTENDED PERIOD OF TIME.  PLEASE AVOID FAST FOOD RESTAURANTS AND INCREASE YOUR WATER INTAKE.    Type 2 diabetes mellitus with stage 3a chronic kidney disease, without long-term current use of insulin (HCC) Assessment & Plan: Chronic, diabetic foot exam was performed. BS log reviewed. I may need to decrease her Ozempic due to poor appetite. I DISCUSSED WITH THE PATIENT AT LENGTH REGARDING THE GOALS OF GLYCEMIC CONTROL AND POSSIBLE LONG-TERM COMPLICATIONS.  I  ALSO STRESSED THE IMPORTANCE OF COMPLIANCE WITH HOME GLUCOSE MONITORING, DIETARY RESTRICTIONS INCLUDING AVOIDANCE OF SUGARY DRINKS/PROCESSED FOODS,  ALONG WITH REGULAR EXERCISE.  I  ALSO STRESSED THE IMPORTANCE OF ANNUAL EYE EXAMS, SELF FOOT CARE AND COMPLIANCE WITH OFFICE VISITS.   Orders: -     CBC -     Lipid panel -     Hemoglobin A1c -     CMP14+EGFR -     PTH, intact and calcium -     Phosphorus -     Protein electrophoresis, serum  Hypertensive heart and renal disease with renal failure, stage 1 through stage 4 or unspecified chronic kidney disease, without heart failure Assessment & Plan: Chronic, well controlled. EKG performed, SB w/ RBBB, RAE. She will c/w furosemide 20mf daily, amlodipine 2.5mg  daily and olmesartan 20mg  daily. She is encouraged to follow low sodium diet.    Orders: -     EKG 12-Lead -     CBC -     Lipid panel -     CMP14+EGFR  CAD in native artery Assessment & Plan: Chronic, she is also followed by Cardiology, Dr. Algie Coffer.  Importance of medication/dietary compliance was discussed with the patient. She is on ASA, isosorbide and ARB therapy.   She is encouraged to follow heart healthy lifestyle.    Primary hypothyroidism Assessment & Plan: Chronic, I will check thyroid panel and adjust meds as needed. She will continue with Synthroid M-F for now.   Orders: -     CBC -     TSH + free T4  Pure hypercholesterolemia Assessment & Plan: Chronic, LDL goal <  70.   She is currently on Repatha, she has not had any issues with the medication.   Orders: -     Lipid panel -     CMP14+EGFR  Peripheral vascular disease, unspecified (HCC) Assessment & Plan: Chronic , reminded to engage in regular exercise. She will c/w ASA and cilostazol. She is not on statin therapy due to myopathy. She is currently on Repatha.  Orders: -     Cilostazol; Take 1 tablet (100 mg total) by mouth daily.  Dispense: 90 tablet; Refill: 2  Estrogen deficiency -     DG Bone Density; Future  Statin myopathy  Body mass index (BMI) of 24.0-24.9 in adult     Return in about 1 year (around 10/22/2023). Patient was given opportunity to ask questions. Patient verbalized understanding of the plan and was able to repeat key elements of the plan. All questions were answered to their satisfaction.   I, Catherine Aliment, MD, have reviewed all documentation for this visit. The documentation on 10/22/22 for the exam, diagnosis, procedures, and orders are all accurate and complete.

## 2022-10-22 NOTE — Assessment & Plan Note (Signed)

## 2022-10-22 NOTE — Assessment & Plan Note (Signed)
Chronic, diabetic foot exam was performed. BS log reviewed. I may need to decrease her Ozempic due to poor appetite. I DISCUSSED WITH THE PATIENT AT LENGTH REGARDING THE GOALS OF GLYCEMIC CONTROL AND POSSIBLE LONG-TERM COMPLICATIONS.  I  ALSO STRESSED THE IMPORTANCE OF COMPLIANCE WITH HOME GLUCOSE MONITORING, DIETARY RESTRICTIONS INCLUDING AVOIDANCE OF SUGARY DRINKS/PROCESSED FOODS,  ALONG WITH REGULAR EXERCISE.  I  ALSO STRESSED THE IMPORTANCE OF ANNUAL EYE EXAMS, SELF FOOT CARE AND COMPLIANCE WITH OFFICE VISITS.

## 2022-10-25 ENCOUNTER — Other Ambulatory Visit: Payer: Self-pay | Admitting: Internal Medicine

## 2022-10-25 DIAGNOSIS — R7989 Other specified abnormal findings of blood chemistry: Secondary | ICD-10-CM

## 2022-10-31 ENCOUNTER — Ambulatory Visit
Admission: RE | Admit: 2022-10-31 | Discharge: 2022-10-31 | Disposition: A | Discharge status: Home / Self Care | Payer: Medicare Other | Source: Ambulatory Visit | Attending: Internal Medicine | Admitting: Internal Medicine

## 2022-10-31 DIAGNOSIS — R7989 Other specified abnormal findings of blood chemistry: Secondary | ICD-10-CM

## 2022-11-11 ENCOUNTER — Other Ambulatory Visit: Payer: Self-pay | Admitting: Internal Medicine

## 2022-11-16 ENCOUNTER — Other Ambulatory Visit: Payer: Self-pay | Admitting: Internal Medicine

## 2022-11-21 ENCOUNTER — Telehealth: Payer: Self-pay

## 2022-11-21 NOTE — Telephone Encounter (Signed)
Patient notified that patient assistance is ready for pick up.

## 2022-11-22 ENCOUNTER — Other Ambulatory Visit: Payer: Self-pay

## 2022-11-22 ENCOUNTER — Telehealth: Payer: Self-pay

## 2022-11-22 NOTE — Telephone Encounter (Signed)
Shipment- [id number] Customer- [id number] PO Number- [id number]  PO Date- 11-13-2022 Ship Date 11-20-2022  PRODUCT- [id number] - Ozempic 0.25, 0.5 1x64mL   Total 5 piece

## 2022-11-23 ENCOUNTER — Other Ambulatory Visit: Payer: Self-pay | Admitting: Internal Medicine

## 2022-11-24 ENCOUNTER — Other Ambulatory Visit: Payer: Self-pay | Admitting: Internal Medicine

## 2022-11-27 ENCOUNTER — Other Ambulatory Visit: Payer: Self-pay

## 2022-11-27 ENCOUNTER — Other Ambulatory Visit (HOSPITAL_COMMUNITY): Payer: Self-pay

## 2022-11-27 MED ORDER — REPATHA SURECLICK 140 MG/ML ~~LOC~~ SOAJ: SUBCUTANEOUS | 3 refills | Status: DC

## 2022-11-29 ENCOUNTER — Other Ambulatory Visit: Payer: Self-pay

## 2022-11-29 DIAGNOSIS — I1310 Hypertensive heart and chronic kidney disease without heart failure, with stage 1 through stage 4 chronic kidney disease, or unspecified chronic kidney disease: Secondary | ICD-10-CM

## 2022-11-29 MED ORDER — HYDRALAZINE HCL 10 MG PO TABS: 10.0000 mg | ORAL_TABLET | Freq: Three times a day (TID) | ORAL | 3 refills | Status: DC

## 2022-12-03 ENCOUNTER — Other Ambulatory Visit: Payer: Self-pay

## 2022-12-03 ENCOUNTER — Other Ambulatory Visit (HOSPITAL_COMMUNITY): Payer: Self-pay

## 2022-12-03 MED ORDER — REPATHA SURECLICK 140 MG/ML ~~LOC~~ SOAJ
140.0000 mg | SUBCUTANEOUS | 3 refills | Status: AC
Start: 2022-12-03 — End: ?
  Filled 2022-12-03: qty 6, 84d supply, fill #0

## 2022-12-11 ENCOUNTER — Other Ambulatory Visit (HOSPITAL_COMMUNITY): Payer: Self-pay

## 2022-12-12 ENCOUNTER — Other Ambulatory Visit: Payer: Self-pay

## 2022-12-21 ENCOUNTER — Other Ambulatory Visit: Payer: Self-pay

## 2022-12-26 ENCOUNTER — Other Ambulatory Visit: Payer: Self-pay | Admitting: Internal Medicine

## 2022-12-26 MED ORDER — LEVOTHYROXINE SODIUM 88 MCG PO TABS: ORAL_TABLET | ORAL | 1 refills | Status: DC

## 2023-01-07 ENCOUNTER — Other Ambulatory Visit: Payer: Self-pay | Admitting: Pharmacist

## 2023-01-07 NOTE — Progress Notes (Signed)
Care Coordination Call  Received message that patient's PA for Repatha was denied. Reviewed denial paperwork. Appears the PA was completed for 6 mL for a 28 day supply, instead of 2 mL for a 28 day supply/6 for 84 day supply.   Contacted listed appeals line [phone]. Transferred to provider line [phone]).   While waiting on hold, faxed PA paperwork. PA denial paperwork stated to fax appeal to [phone]. A representative told me to fax appeal to [phone] attn appeals department.   Was then told to call [phone] to initiate a pharmacy appeal. Completed this telephonically to adjust day supply, will also fax paperwork to the number they gave me - [phone].   We should receive a response within 7 business days.   Catie Eppie Gibson, PharmD, BCACP, CPP Clinical Pharmacist [facility] [phone]

## 2023-02-18 ENCOUNTER — Other Ambulatory Visit: Payer: Self-pay | Admitting: Internal Medicine

## 2023-03-26 ENCOUNTER — Other Ambulatory Visit: Payer: Self-pay | Admitting: Internal Medicine

## 2023-04-01 ENCOUNTER — Other Ambulatory Visit: Payer: Self-pay | Admitting: Internal Medicine

## 2023-04-01 DIAGNOSIS — Z1231 Encounter for screening mammogram for malignant neoplasm of breast: Secondary | ICD-10-CM

## 2023-04-08 ENCOUNTER — Encounter: Payer: Self-pay | Admitting: Internal Medicine

## 2023-04-29 ENCOUNTER — Inpatient Hospital Stay: Admission: RE | Admit: 2023-04-29 | Payer: Medicare Other | Source: Ambulatory Visit

## 2023-05-02 ENCOUNTER — Ambulatory Visit
Admission: RE | Admit: 2023-05-02 | Discharge: 2023-05-02 | Disposition: A | Discharge status: Home / Self Care | Payer: Medicare Other | Source: Ambulatory Visit | Attending: Internal Medicine | Admitting: Internal Medicine

## 2023-05-02 DIAGNOSIS — Z1231 Encounter for screening mammogram for malignant neoplasm of breast: Secondary | ICD-10-CM

## 2023-05-10 ENCOUNTER — Other Ambulatory Visit: Payer: Self-pay | Admitting: Internal Medicine

## 2023-05-19 ENCOUNTER — Other Ambulatory Visit: Payer: Self-pay | Admitting: Internal Medicine

## 2023-05-21 ENCOUNTER — Ambulatory Visit
Admission: RE | Admit: 2023-05-21 | Discharge: 2023-05-21 | Disposition: A | Discharge status: Home / Self Care | Payer: Medicare Other | Source: Ambulatory Visit | Attending: Internal Medicine | Admitting: Internal Medicine

## 2023-05-21 DIAGNOSIS — E2839 Other primary ovarian failure: Secondary | ICD-10-CM

## 2023-05-27 ENCOUNTER — Encounter: Payer: Self-pay | Admitting: Internal Medicine

## 2023-05-27 ENCOUNTER — Ambulatory Visit (INDEPENDENT_AMBULATORY_CARE_PROVIDER_SITE_OTHER): Payer: Medicare Other | Admitting: Internal Medicine

## 2023-05-27 VITALS — BP 110/74 | HR 57 | Temp 98.4°F | Ht 62.0 in | Wt 137.4 lb

## 2023-05-27 DIAGNOSIS — I1310 Hypertensive heart and chronic kidney disease without heart failure, with stage 1 through stage 4 chronic kidney disease, or unspecified chronic kidney disease: Secondary | ICD-10-CM

## 2023-05-27 DIAGNOSIS — E1122 Type 2 diabetes mellitus with diabetic chronic kidney disease: Secondary | ICD-10-CM

## 2023-05-27 DIAGNOSIS — I739 Peripheral vascular disease, unspecified: Secondary | ICD-10-CM | POA: Diagnosis not present

## 2023-05-27 DIAGNOSIS — N1831 Chronic kidney disease, stage 3a: Secondary | ICD-10-CM | POA: Diagnosis not present

## 2023-05-27 DIAGNOSIS — E2839 Other primary ovarian failure: Secondary | ICD-10-CM

## 2023-05-27 DIAGNOSIS — E039 Hypothyroidism, unspecified: Secondary | ICD-10-CM

## 2023-05-27 DIAGNOSIS — E7800 Pure hypercholesterolemia, unspecified: Secondary | ICD-10-CM

## 2023-05-27 DIAGNOSIS — G720 Drug-induced myopathy: Secondary | ICD-10-CM

## 2023-05-27 DIAGNOSIS — T466X5A Adverse effect of antihyperlipidemic and antiarteriosclerotic drugs, initial encounter: Secondary | ICD-10-CM

## 2023-05-27 MED ORDER — HYDRALAZINE HCL 10 MG PO TABS: 10.0000 mg | ORAL_TABLET | Freq: Three times a day (TID) | ORAL | 3 refills | Status: AC

## 2023-05-27 MED ORDER — OLMESARTAN MEDOXOMIL 20 MG PO TABS: 20.0000 mg | ORAL_TABLET | Freq: Every day | ORAL | 2 refills | Status: DC

## 2023-05-27 NOTE — Assessment & Plan Note (Signed)
 Chronic, LDL goal < 70.   She is statin intolerant.  She has previously done well with Repatha. I will reach out to [facility] pharmacist for assistance.

## 2023-05-27 NOTE — Assessment & Plan Note (Signed)
 Chronic, well controlled.She will c/w furosemide 20mg  daily, hydralazine 10mg  po tid, amlodipine 2.5mg  daily and olmesartan 20mg  daily. She is also on Imdur 60mg  daily. She is encouraged to follow low sodium diet.

## 2023-05-27 NOTE — Patient Instructions (Signed)

## 2023-05-27 NOTE — Progress Notes (Signed)
 I,Victoria T Deloria Lair, CMA,acting as a Neurosurgeon for [name], MD.,have documented all relevant documentation on the behalf of [name], MD,as directed by  [name], MD while in the presence of [name], MD.  Subjective:  Patient ID: [name] , female    DOB: [birth year] , 78 y.o.   MRN: [id number]  Chief Complaint  Patient presents with   Diabetes   Hypothyroidism   Hyperlipidemia    HPI  The patient is here today for a follow-up on her diabetes, blood pressure and thyroid check. She was last seen July 2024.  She reports compliance with meds. She denies headaches, chest pain and shortness of breath. She has not had any issues since her last visit, states she feels well.  She has no concerns, other than Repatha. She has not been able to fill the rx.   The last time she had medcation was in August.   She is scheduled for DM eye exam 06/25/2023.      Diabetes She presents for her follow-up diabetic visit. She has type 2 diabetes mellitus. Pertinent negatives for hypoglycemia include no headaches. Pertinent negatives for diabetes include no blurred vision, no polydipsia, no polyphagia and no polyuria. There are no hypoglycemic complications. Diabetic complications include heart disease and nephropathy. Risk factors for coronary artery disease include diabetes mellitus, dyslipidemia, hypertension and post-menopausal. She is following a diabetic diet. She participates in exercise intermittently. An ACE inhibitor/angiotensin II receptor blocker is being taken. Eye exam is current.  Hypertension This is a chronic problem. The current episode started more than 1 year ago. The problem has been gradually improving since onset. The problem is controlled. Pertinent negatives include no blurred vision or headaches. The current treatment provides moderate improvement. 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     Past Medical History:  Diagnosis  Date   Benign hypertensive renal disease    CKD stage 2 due to type 2 diabetes mellitus (HCC)    Hypertension    Hypothyroidism    Obesity    OSA on CPAP    Pure hypercholesterolemia    PVD (peripheral vascular disease) (HCC)    Vitamin D deficiency      Family History  Problem Relation Age of Onset   Alzheimer's disease Mother    Aneurysm Father    Cancer Brother      Current Outpatient Medications:    ALPHAGAN P 0.1 % SOLN, , Disp: , Rfl:    amLODipine (NORVASC) 2.5 MG tablet, TAKE 1 TABLET BY MOUTH DAILY, Disp: 90 tablet, Rfl: 3   Apoaequorin (PREVAGEN PO), Take 1 tablet by mouth daily., Disp: , Rfl:    aspirin EC 81 MG tablet, Take 81 mg by mouth daily., Disp: , Rfl:    b complex vitamins tablet, Take 1 tablet by mouth daily with lunch. , Disp: , Rfl:    Blood Glucose Monitoring Suppl (ONETOUCH VERIO FLEX SYSTEM) w/Device KIT, USE AS DIRECTED TO CHECK BLOOD GLUCOSE 3 TIMES A DAY., Disp: 1 kit, Rfl: 0   Carboxymethylcellulose Sodium (ARTIFICIAL TEARS OP), Apply to eye., Disp: , Rfl:    cholecalciferol (VITAMIN D) 25 MCG (1000 UNIT) tablet, Take 1 tablet by mouth daily. , Disp: , Rfl:    cilostazol (PLETAL) 100 MG tablet, Take 1 tablet (100 mg total) by mouth daily., Disp: 90 tablet, Rfl: 2   furosemide (LASIX) 20 MG tablet, TAKE 1 TABLET BY MOUTH DAILY, Disp: 90 tablet, Rfl: 3  glucose blood test strip, Use as instructed, Disp: 100 each, Rfl: 12   isosorbide mononitrate (IMDUR) 60 MG 24 hr tablet, TAKE 1 TABLET BY MOUTH DAILY, Disp: 90 tablet, Rfl: 3   levothyroxine (SYNTHROID) 88 MCG tablet, TAKE 1 TABLET BY MOUTH DAILY  BEFORE BREAKFAST, Disp: 90 tablet, Rfl: 3   Multiple Vitamins-Minerals (MULTIVITAMIN WITH MINERALS) tablet, Take 1 tablet by mouth daily with lunch. , Disp: , Rfl:    nitroGLYCERIN (NITROSTAT) 0.4 MG SL tablet, PLACE 1 TABLET UNDER THE TONGUE EVERY 5 MINUTES X 3 DOSES AS NEEDED FOR CHEST PAIN., Disp: 100 tablet, Rfl: 1   Polyethyl Glycol-Propyl Glycol  (SYSTANE OP), Apply to eye., Disp: , Rfl:    potassium chloride (KLOR-CON) 10 MEQ tablet, TAKE 1 TABLET BY MOUTH TWICE  DAILY WITH BREAKFAST AND LUNCH, Disp: 180 tablet, Rfl: 3   Semaglutide,0.25 or 0.5MG /DOS, (OZEMPIC, 0.25 OR 0.5 MG/DOSE,) 2 MG/3ML SOPN, Inject 0.5mg  sq once weekly, Disp: 3 mL, Rfl: 2   Calcium Carb-Cholecalciferol (CALCIUM + D3 PO), Take 1 tablet by mouth at bedtime. (Patient not taking: Reported on 05/27/2023), Disp: , Rfl:    Evolocumab (REPATHA SURECLICK) 140 MG/ML SOAJ, Inject 140 mg into the skin every 14 (fourteen) days. (Patient not taking: Reported on 05/27/2023), Disp: 6 mL, Rfl: 3   hydrALAZINE (APRESOLINE) 10 MG tablet, Take 1 tablet (10 mg total) by mouth 3 (three) times daily., Disp: 270 tablet, Rfl: 3   Magnesium Glycinate 100 MG CAPS, One capsule po nightly (Patient not taking: Reported on 05/27/2023), Disp: 90 capsule, Rfl: 2   olmesartan (BENICAR) 20 MG tablet, Take 1 tablet (20 mg total) by mouth daily., Disp: 90 tablet, Rfl: 2   Allergies  Allergen Reactions   Atorvastatin Swelling and Other (See Comments)    Limbs swell   Clopidogrel Other (See Comments)    Caused bruising   Metoprolol Other (See Comments)    Slows heart rate too low   Metoprolol Tartrate Swelling and Other (See Comments)    Site of swelling not noted   Pregabalin Swelling and Other (See Comments)    Site of swelling not noted   Rosuvastatin Calcium Other (See Comments)    Unknown reaction     Review of Systems  Constitutional: Negative.   Eyes:  Negative for blurred vision.  Respiratory: Negative.    Cardiovascular: Negative.   Gastrointestinal: Negative.   Endocrine: Negative for polydipsia, polyphagia and polyuria.  Neurological: Negative.  Negative for headaches.  Psychiatric/Behavioral: Negative.       Today's Vitals   05/27/23 1601  BP: 110/74  Pulse: (!) 57  Temp: 98.4 F (36.9 C)  SpO2: 98%  Weight: 137 lb 6.4 oz (62.3 kg)  Height: 5\' 2"  (1.575 m)   Body mass  index is 25.13 kg/m.  Wt Readings from Last 3 Encounters:  05/27/23 137 lb 6.4 oz (62.3 kg)  10/22/22 134 lb 3.2 oz (60.9 kg)  06/20/22 140 lb (63.5 kg)     Objective:  Physical Exam Vitals and nursing note reviewed.  Constitutional:      Appearance: Normal appearance.  HENT:     Head: Normocephalic and atraumatic.  Eyes:     Extraocular Movements: Extraocular movements intact.  Cardiovascular:     Rate and Rhythm: Normal rate and regular rhythm.     Heart sounds: Murmur heard.  Pulmonary:     Effort: Pulmonary effort is normal.     Breath sounds: Normal breath sounds.  Musculoskeletal:     Cervical  back: Normal range of motion.  Skin:    General: Skin is warm.  Neurological:     General: No focal deficit present.     Mental Status: She is alert.  Psychiatric:        Mood and Affect: Mood normal.        Behavior: Behavior normal.         Assessment And Plan:  Type 2 diabetes mellitus with stage 3a chronic kidney disease, without long-term current use of insulin (HCC) Assessment & Plan: Chronic, [facility] registry reviewed.  She will continue with Ozempic 0.5mg  weekly.  She agrees to rto in four months for re-evaluation.   Orders: -     CBC -     CMP14+EGFR -     Lipid panel -     Hemoglobin A1c -     Microalbumin / creatinine urine ratio  Hypertensive heart and renal disease with renal failure, stage 1 through stage 4 or unspecified chronic kidney disease, without heart failure Assessment & Plan: Chronic, well controlled.She will c/w furosemide 20mg  daily, hydralazine 10mg  po tid, amlodipine 2.5mg  daily and olmesartan 20mg  daily. She is also on Imdur 60mg  daily. She is encouraged to follow low sodium diet.    Orders: -     hydrALAZINE HCl; Take 1 tablet (10 mg total) by mouth 3 (three) times daily.  Dispense: 270 tablet; Refill: 3  Primary hypothyroidism Assessment & Plan: Chronic, I will check thyroid panel and adjust meds as needed. She will continue with  Synthroid M-F for now.   Orders: -     TSH + free T4  Pure hypercholesterolemia Assessment & Plan: Chronic, LDL goal < 70.   She is statin intolerant.  She has previously done well with Repatha. I will reach out to [facility] pharmacist for assistance.      Peripheral vascular disease, unspecified (HCC) Assessment & Plan: Chronic , reminded to engage in regular exercise. She will c/w ASA and cilostazol. She is not on statin therapy due to myopathy. I plan to restart her on Repatha.    Statin myopathy  Other orders -     Olmesartan Medoxomil; Take 1 tablet (20 mg total) by mouth daily.  Dispense: 90 tablet; Refill: 2     Return if symptoms worsen or fail to improve.  Patient was given opportunity to ask questions. Patient verbalized understanding of the plan and was able to repeat key elements of the plan. All questions were answered to their satisfaction.  [name], MD  I, [name], MD, have reviewed all documentation for this visit. The documentation on 05/27/23 for the exam, diagnosis, procedures, and orders are all accurate and complete.   IF YOU HAVE BEEN REFERRED TO A SPECIALIST, IT MAY TAKE 1-2 WEEKS TO SCHEDULE/PROCESS THE REFERRAL. IF YOU HAVE NOT HEARD FROM US/SPECIALIST IN TWO WEEKS, PLEASE GIVE [place] A CALL AT [phone].   THE PATIENT IS ENCOURAGED TO PRACTICE SOCIAL DISTANCING DUE TO THE COVID-19 PANDEMIC.

## 2023-05-27 NOTE — Assessment & Plan Note (Addendum)
 Chronic , reminded to engage in regular exercise. She will c/w ASA and cilostazol. She is not on statin therapy due to myopathy. I plan to restart her on Repatha.

## 2023-05-27 NOTE — Assessment & Plan Note (Signed)
Chronic, I will check thyroid panel and adjust meds as needed. She will continue with Synthroid M-F for now.

## 2023-05-27 NOTE — Assessment & Plan Note (Signed)
 Chronic, NCIR registry reviewed.  She will continue with Ozempic 0.5mg  weekly.  She agrees to rto in four months for re-evaluation.

## 2023-05-28 ENCOUNTER — Ambulatory Visit: Payer: Self-pay | Admitting: Internal Medicine

## 2023-05-28 LAB — CBC
Hematocrit: 32.7 % — ABNORMAL LOW (ref 34.0–46.6)
Hemoglobin: 10.8 g/dL — ABNORMAL LOW (ref 11.1–15.9)
MCH: 31.2 pg (ref 26.6–33.0)
MCHC: 33 g/dL (ref 31.5–35.7)
MCV: 95 fL (ref 79–97)
Platelets: 292 10*3/uL (ref 150–450)
RBC: 3.46 x10E6/uL — ABNORMAL LOW (ref 3.77–5.28)
RDW: 12 % (ref 11.7–15.4)
WBC: 4.7 10*3/uL (ref 3.4–10.8)

## 2023-05-28 LAB — LIPID PANEL
Chol/HDL Ratio: 2.4 ratio (ref 0.0–4.4)
Cholesterol, Total: 192 mg/dL (ref 100–199)
HDL: 79 mg/dL (ref >39)
LDL Chol Calc (NIH): 99 mg/dL (ref 0–99)
Triglycerides: 75 mg/dL (ref 0–149)
VLDL Cholesterol Cal: 14 mg/dL (ref 5–40)

## 2023-05-28 LAB — CMP14+EGFR
ALT: 19 IU/L (ref 0–32)
AST: 42 IU/L — ABNORMAL HIGH (ref 0–40)
Albumin: 4.5 g/dL (ref 3.8–4.8)
Alkaline Phosphatase: 60 IU/L (ref 44–121)
BUN/Creatinine Ratio: 10 — ABNORMAL LOW (ref 12–28)
BUN: 12 mg/dL (ref 8–27)
Bilirubin Total: 0.2 mg/dL (ref 0.0–1.2)
CO2: 23 mmol/L (ref 20–29)
Calcium: 9.7 mg/dL (ref 8.7–10.3)
Chloride: 106 mmol/L (ref 96–106)
Creatinine, Ser: 1.15 mg/dL — ABNORMAL HIGH (ref 0.57–1.00)
Globulin, Total: 1.8 g/dL (ref 1.5–4.5)
Glucose: 98 mg/dL (ref 70–99)
Potassium: 3.9 mmol/L (ref 3.5–5.2)
Sodium: 141 mmol/L (ref 134–144)
Total Protein: 6.3 g/dL (ref 6.0–8.5)
eGFR: 49 mL/min/{1.73_m2} — ABNORMAL LOW (ref >59)

## 2023-05-28 LAB — TSH+FREE T4
Free T4: 1.16 ng/dL (ref 0.82–1.77)
TSH: 2.06 u[IU]/mL (ref 0.450–4.500)

## 2023-05-28 LAB — HEMOGLOBIN A1C
Est. average glucose Bld gHb Est-mCnc: 117 mg/dL
Hgb A1c MFr Bld: 5.7 % — ABNORMAL HIGH (ref 4.8–5.6)

## 2023-05-28 NOTE — Telephone Encounter (Signed)
 Copied from CRM [id number]. Topic: Clinical - Lab/Test Results >> May 28, 2023  3:30 PM Carrielelia G wrote: Patient [name] was given her lab results but would like to speak with medical..    Patient calling regarding labs. Unable to hear patient due to background noise. Patient states she is in the car and will call back when she gets home.    Reason for Disposition  Caller requesting routine or non-urgent lab result  Answer Assessment - Initial Assessment Questions 1. REASON FOR CALL or QUESTION: "What is your reason for calling today?" or "How can I best help you?" or "What question do you have that I can help answer?"     Lab results  2. CALLER: Document the source of call. (e.g., laboratory, patient).     Patient  Protocols used: PCP Call - No Triage-A-AH

## 2023-05-28 NOTE — Telephone Encounter (Signed)
  Info only: Pt wanted to know results of labs. RN read notes from PCP and give values. RN told pt to pick up stool kit from office. Pt stated she has not noticed blood in stool. Pt had no further questions for concerns.        Copied from CRM [id number]. Topic: Clinical - Lab/Test Results >> May 28, 2023  3:30 PM Alfred Levins wrote: Patient deguire was given her lab results but would like to speak with medical..    Reason for CRM: [name], MD 05/28/2023  1:31 PM EST Back to Top  Your blood count has dropped, have you noticed blood in your stools? Please pick up stool kit, we need to check your stool for blood.   Your kidney function is stable. Your liver enzyme is slightly elevated. Your hba1c is 5.7, this is stable.   Your LDL, bad cholesterol is 99 - should be less than 70 due to your history of heart disease. Please take cholesterol meds. Your thyroid function is normal.   Let me know if you have any questions.   Take care, RS Reason for Disposition  Health Information question, no triage required and triager able to answer question  Answer Assessment - Initial Assessment Questions 1. REASON FOR CALL or QUESTION: "What is your reason for calling today?" or "How can I best help you?" or "What question do you have that I can help answer?"     Pt wanted to know lab results.  Protocols used: Information Only Call - No Triage-A-AH

## 2023-05-29 ENCOUNTER — Other Ambulatory Visit: Payer: Self-pay

## 2023-05-31 ENCOUNTER — Telehealth: Payer: Self-pay | Admitting: Pharmacist

## 2023-05-31 DIAGNOSIS — E785 Hyperlipidemia, unspecified: Secondary | ICD-10-CM

## 2023-05-31 DIAGNOSIS — G720 Drug-induced myopathy: Secondary | ICD-10-CM

## 2023-05-31 NOTE — Progress Notes (Signed)
 05/31/2023 Name: [name] MRN: [id number] DOB: [birth year]  Chief Complaint  Patient presents with   Medication Assistance    Repatha    [name] is a 78 y.o. year old female who presented for a telephone visit.   They were referred to the pharmacist by their PCP for assistance in managing hyperlipidemia.    Subjective:  Care Team: Primary Care Provider: [name], MD ; Next Scheduled Visit: 07/03/2023  Medication Access/Adherence  Current Pharmacy:  [id number] [place], [place] - [address] AT [facility] [address]  [place] [address] Phone: [phone] Fax: [phone]  [facility] - [place], [place] - [address] [place] [address] Phone: [phone] Fax: [phone]  [place] [facility] [address] [place] [address] Phone: [phone] Fax: [phone]   Patient reports affordability concerns with their medications: Yes  Repatha Patient reports access/transportation concerns to their pharmacy: No  Patient reports adherence concerns with their medications:  Yes  Repatha   Diabetes:  Current medications:  Ozempic 0.5 mg weekly A1c 5.7%  Current medication access support: Ozempic through [employer]  Hyperlipidemia/ASCVD Risk Reduction  Current lipid lowering medications: Repatha 140mg  every 2 weeks. (Has not had due to issues with affordability)   Antiplatelet regimen:  Cilostazol 100mg  daily Aspirin 81 mg 1 tablet daily  Clinical ASCVD: Yes  The 10-year ASCVD risk score (Arnett DK, et al., 2019) is: 36.4%   Values used to calculate the score:     Age: 36 years     Sex: Female     Is Non-Hispanic African American: Yes     Diabetic: Yes     Tobacco smoker: No     Systolic Blood Pressure: 110 mmHg     Is BP treated: Yes     HDL Cholesterol: 79 mg/dL     Total Cholesterol: 192 mg/dL     Objective:  Lab Results  Component Value Date   HGBA1C 5.7 (H) 05/27/2023    Lab Results  Component Value Date   CREATININE 1.15 (H) 05/27/2023   BUN 12 05/27/2023   NA 141 05/27/2023   K 3.9 05/27/2023   CL 106 05/27/2023   CO2 23 05/27/2023    Lab Results  Component Value Date   CHOL 192 05/27/2023   HDL 79 05/27/2023   LDLCALC 99 05/27/2023   TRIG 75 05/27/2023   CHOLHDL 2.4 05/27/2023    Medications Reviewed Today     Reviewed by Beecher Mcardle, RPH (Pharmacist) on 05/31/23 at 1013  Med List Status: <None>   Medication Order Taking? Sig Documenting Provider Last Dose Status Informant  ALPHAGAN P 0.1 % SOLN [id number] Yes  [provider] Taking Active   amLODipine (NORVASC) 2.5 MG tablet [id number] Yes TAKE 1 TABLET BY MOUTH DAILY [name], MD Taking Active   Apoaequorin (PREVAGEN PO) [id number] Yes Take 1 tablet by mouth daily. [provider] Taking Active Self  aspirin EC 81 MG tablet [id number] Yes Take 81 mg by mouth daily. [provider] Taking Active            Med Note Leavy Cella, Romilda Joy   Fri May 31, 2023 10:11 AM)    b complex vitamins tablet [id number] Yes Take 1 tablet by mouth daily with lunch.  [provider] Taking Active            Med  Note Beecher Mcardle   Fri May 31, 2023 10:11 AM)    Blood Glucose Monitoring Suppl (ONETOUCH VERIO FLEX SYSTEM) w/Device Andria Rhein [id number] Yes USE AS DIRECTED TO CHECK BLOOD GLUCOSE 3 TIMES A DAY. [name], MD Taking Active   Calcium Carb-Cholecalciferol (CALCIUM + D3 PO) [id number] Yes Take 1 tablet by mouth at bedtime. [provider] Taking Active            Med Note Leavy Cella, Romilda Joy   Fri May 31, 2023 10:11 AM)    Carboxymethylcellulose Sodium (ARTIFICIAL TEARS OP) [id number] Yes Apply to eye. [provider] Taking Active Self  CEQUA 0.09 % SOLN [id number] Yes Apply 1 drop to eye 2 (two) times daily. [provider] Taking Active   cholecalciferol  (VITAMIN D) 25 MCG (1000 UNIT) tablet [id number] Yes Take 1 tablet by mouth daily.  [provider] Taking Active            Med Note Leavy Cella, Romilda Joy   Fri May 31, 2023 10:12 AM)    cilostazol (PLETAL) 100 MG tablet [id number] Yes Take 1 tablet (100 mg total) by mouth daily. [name], MD Taking Active   Evolocumab [facility] SURECLICK) 140 MG/ML Ivory Broad [id number] No Inject 140 mg into the skin every 14 (fourteen) days.  Patient not taking: Reported on 05/31/2023   [name], MD Not Taking Active   furosemide (LASIX) 20 MG tablet [id number] Yes TAKE 1 TABLET BY MOUTH DAILY [name], MD Taking Active   glucose blood test strip [id number] Yes Use as instructed [name], MD Taking Active   hydrALAZINE (APRESOLINE) 10 MG tablet [id number] Yes Take 1 tablet (10 mg total) by mouth 3 (three) times daily. [name], MD Taking Active   isosorbide mononitrate (IMDUR) 60 MG 24 hr tablet [id number] Yes TAKE 1 TABLET BY MOUTH DAILY [name], MD Taking Active   levothyroxine (SYNTHROID) 88 MCG tablet [id number] Yes TAKE 1 TABLET BY MOUTH DAILY  BEFORE Kathe Mariner, MD Taking Active   Multiple Vitamins-Minerals (MULTIVITAMIN WITH MINERALS) tablet [id number] Yes Take 1 tablet by mouth daily with lunch.  [provider] Taking Active Self  nitroGLYCERIN (NITROSTAT) 0.4 MG SL tablet [id number] Yes PLACE 1 TABLET UNDER THE TONGUE EVERY 5 MINUTES X 3 DOSES AS NEEDED FOR CHEST PAIN. [name], MD Taking Active   olmesartan Navos) 20 MG tablet [id number] Yes Take 1 tablet (20 mg total) by mouth daily. [name], MD Taking Active   Polyethyl Glycol-Propyl Glycol [facility] OP) [id number] Yes Apply to eye. [provider] Taking Active Self  potassium chloride (KLOR-CON) 10 MEQ tablet [id number] Yes TAKE 1 TABLET BY MOUTH TWICE  DAILY WITH BREAKFAST AND LUNCH [name], MD Taking Active   Semaglutide,0.25 or 0.5MG /DOS, (OZEMPIC, 0.25 OR 0.5 MG/DOSE,) 2  MG/3ML Namon Cirri [id number] Yes Inject 0.5mg  sq once weekly [name], MD Taking Active               Assessment/Plan:   Hyperlipidemia/ASCVD Risk Reduction: - Currently uncontrolled.  LDL-129 mg/dl -Has not had Repatha since last summer due to billing issues.  Upon review of her chart, she had a PA submitted by the pharmacy team on 01/24:    Pharmacy Patient Advocate Encounter   Prior Authorization for REPATHA 140 MG/ML INJ has been approved.     PA# [id number] Effective dates: 04/13/22 through 10/12/22   Received notification from [facility] MEDICARE that prior authorization for REPATHA 140 MG/ML INJ is needed.    PA submitted  on 04/13/22 Key [id number]    Catie Clearance Coots also documented completing a PA form and an appeal 10/24.  [employer] was called. The representative said the PA Catie sent in was rejected as a duplicate and that a new PA needed to be completed as the time had expired from October.  -Will reach out to [place] about completing a PA.  To help with the cost of the copay, A [employer] Kennedy Bucker was completed for the patient and she was approved for $2500 for until 04/2024   [employer]Well ID [id number] Reference Number [id number] [place] Fund Hypercholesterolemia - Medicare Access Assistance Type Co-pay Start Date 05/01/2023 End Date 04/29/2024   Follow Up Plan:    Route note to [place] with Dr. Allyne Gee. Follow up in 3-5 business days.  Beecher Mcardle, PharmD, BCACP Clinical Pharmacist [phone]

## 2023-06-04 ENCOUNTER — Telehealth: Payer: Self-pay

## 2023-06-04 ENCOUNTER — Telehealth: Payer: Self-pay | Admitting: Pharmacist

## 2023-06-04 DIAGNOSIS — E785 Hyperlipidemia, unspecified: Secondary | ICD-10-CM

## 2023-06-04 NOTE — Telephone Encounter (Signed)
-----   Message from [facility] sent at 06/04/2023  9:38 AM EDT ----- Elvina Sidle I just tried to process her PA. It kicked back this message.   Information regarding your request This medication or product was previously approved on [id number] from 2023-03-27 to 2024-03-25. ##Please note: This request was submitted electronically. Formulary lowering, tiering exception, cost reduction and/or pre-benefit determination review (including prospective Medicare hospice reviews) requests cannot be requested using this method of submission. Providers contact us at [phone] for further assistance.

## 2023-06-04 NOTE — Progress Notes (Signed)
   06/04/2023  Patient ID: [name], female   DOB: [birth year], 78 y.o.   MRN: [id number]  Called [facility] to follow up on billing of Repatha.  Dr. Zella Ball CMA attempted to do PA for Repatha and received a rejection stating that the PA had previously been approved.  I called the PA line for the Payer.  A test claim was run and it was determined that her PA was in fact in effect until 03/25/24.  [facility] was called and coached through billing Repatha to both the Patient's insurance AND the [facility] at the same time.  Here is the information for  [employer]: [id number] Card Status Active BIN [id number] PCN [id number] PC Group [id number] Help Desk [phone] Provider PDMI Processor PDMI  [facility] was able to get the claim to go through for $0.00  Plan: Called Patient and she said she would pick up the prescription from the Pharmacy.  Beecher Mcardle, PharmD, BCACP Clinical Pharmacist [phone]

## 2023-06-05 ENCOUNTER — Telehealth: Payer: Self-pay | Admitting: Pharmacist

## 2023-06-05 DIAGNOSIS — E785 Hyperlipidemia, unspecified: Secondary | ICD-10-CM

## 2023-06-05 NOTE — Progress Notes (Signed)
   06/05/2023  Patient ID: [name], female   DOB: 20-Feb-1946, 78 y.o.   MRN: [id number]  Patient called me and said when she went to pick up Repatha, the Pharmacy said they did not have a prescription.  I called [facility].  Her Repatha is filled and ready to be picked up.  The person who answered the phone said it is in a large bag so it was not filed like all the other medications.  I called the patient back to let her know and left her a voicemail.    Beecher Mcardle, PharmD, [facility] Clinical Pharmacist [facility] [phone]

## 2023-06-19 ENCOUNTER — Other Ambulatory Visit (INDEPENDENT_AMBULATORY_CARE_PROVIDER_SITE_OTHER)

## 2023-06-19 ENCOUNTER — Other Ambulatory Visit: Payer: Self-pay | Admitting: Internal Medicine

## 2023-06-19 ENCOUNTER — Encounter: Payer: Self-pay | Admitting: Internal Medicine

## 2023-06-19 DIAGNOSIS — I1310 Hypertensive heart and chronic kidney disease without heart failure, with stage 1 through stage 4 chronic kidney disease, or unspecified chronic kidney disease: Secondary | ICD-10-CM

## 2023-06-19 DIAGNOSIS — D649 Anemia, unspecified: Secondary | ICD-10-CM

## 2023-06-19 DIAGNOSIS — N1831 Chronic kidney disease, stage 3a: Secondary | ICD-10-CM

## 2023-06-19 LAB — HEMOCCULT GUIAC POC 1CARD (OFFICE)
Card #2 Fecal Occult Blod, POC: NEGATIVE
Card #3 Fecal Occult Blood, POC: NEGATIVE
Fecal Occult Blood, POC: NEGATIVE

## 2023-06-25 LAB — HM DIABETES EYE EXAM

## 2023-07-03 ENCOUNTER — Encounter: Payer: Self-pay | Admitting: Internal Medicine

## 2023-07-03 ENCOUNTER — Ambulatory Visit: Payer: Medicare Other

## 2023-07-03 ENCOUNTER — Ambulatory Visit: Payer: Self-pay | Admitting: Internal Medicine

## 2023-07-03 ENCOUNTER — Telehealth: Payer: Self-pay

## 2023-07-03 VITALS — BP 114/60 | HR 59 | Temp 98.0°F | Ht 62.0 in | Wt 138.0 lb

## 2023-07-03 VITALS — BP 114/60 | HR 59 | Temp 98.0°F | Ht 62.0 in | Wt 138.8 lb

## 2023-07-03 DIAGNOSIS — I1310 Hypertensive heart and chronic kidney disease without heart failure, with stage 1 through stage 4 chronic kidney disease, or unspecified chronic kidney disease: Secondary | ICD-10-CM

## 2023-07-03 DIAGNOSIS — R748 Abnormal levels of other serum enzymes: Secondary | ICD-10-CM

## 2023-07-03 DIAGNOSIS — I2510 Atherosclerotic heart disease of native coronary artery without angina pectoris: Secondary | ICD-10-CM | POA: Diagnosis not present

## 2023-07-03 DIAGNOSIS — N1831 Chronic kidney disease, stage 3a: Secondary | ICD-10-CM | POA: Diagnosis not present

## 2023-07-03 DIAGNOSIS — E7800 Pure hypercholesterolemia, unspecified: Secondary | ICD-10-CM

## 2023-07-03 DIAGNOSIS — E1122 Type 2 diabetes mellitus with diabetic chronic kidney disease: Secondary | ICD-10-CM | POA: Diagnosis not present

## 2023-07-03 DIAGNOSIS — Z0000 Encounter for general adult medical examination without abnormal findings: Secondary | ICD-10-CM

## 2023-07-03 DIAGNOSIS — D649 Anemia, unspecified: Secondary | ICD-10-CM

## 2023-07-03 DIAGNOSIS — R93429 Abnormal radiologic findings on diagnostic imaging of unspecified kidney: Secondary | ICD-10-CM

## 2023-07-03 DIAGNOSIS — N2889 Other specified disorders of kidney and ureter: Secondary | ICD-10-CM

## 2023-07-03 DIAGNOSIS — Z79899 Other long term (current) drug therapy: Secondary | ICD-10-CM

## 2023-07-03 MED ORDER — CYANOCOBALAMIN 1000 MCG/ML IJ SOLN: 1000.0000 ug | Freq: Once | INTRAMUSCULAR | Status: DC

## 2023-07-03 NOTE — Patient Instructions (Signed)
 [name] , Thank you for taking time to come for your Medicare Wellness Visit. I appreciate your ongoing commitment to your health goals. Please review the following plan we discussed and let me know if I can assist you in the future.   Referrals/Orders/Follow-Ups/Clinician Recommendations: none  This is a list of the screening recommended for you and due dates:  Health Maintenance  Topic Date Due   Yearly kidney health urinalysis for diabetes  06/20/2023   COVID-19 Vaccine (7 - Moderna risk 2024-25 season) 10/06/2023   Complete foot exam   10/22/2023   Flu Shot  10/25/2023   Hemoglobin A1C  11/27/2023   Yearly kidney function blood test for diabetes  05/26/2024   Eye exam for diabetics  06/24/2024   Medicare Annual Wellness Visit  07/02/2024   DTaP/Tdap/Td vaccine (2 - Td or Tdap) 04/13/2032   Pneumonia Vaccine  Completed   DEXA scan (bone density measurement)  Completed   Hepatitis C Screening  Completed   Zoster (Shingles) Vaccine  Completed   HPV Vaccine  Aged Out   Colon Cancer Screening  Discontinued    Advanced directives: (Declined) Advance directive discussed with you today. Even though you declined this today, please call our office should you change your mind, and we can give you the proper paperwork for you to fill out.  Next Medicare Annual Wellness Visit scheduled for next year: Yes  insert Preventive Care attachment Insert FALL PREVENTION attachment if needed

## 2023-07-03 NOTE — Progress Notes (Signed)
 I,Victoria T Basil Lim, CMA,acting as a Neurosurgeon for Smiley Dung, MD.,have documented all relevant documentation on the behalf of Smiley Dung, MD,as directed by  Smiley Dung, MD while in the presence of Smiley Dung, MD.  Subjective:  Patient ID: [name] , female    DOB: 24-Sep-1945 , 78 y.o.   MRN: [id number]  Chief Complaint  Patient presents with   Diabetes    Patient presents today for dm follow up. She reports compliance with medications. Denies headache, chest pain & sob. AWV completed with [facility] advisor: Letitia Ravens.    Hypertension    HPI Discussed the use of AI scribe software for clinical note transcription with the patient, who gave verbal consent to proceed.  History of Present Illness [name] "Erby Hatcher B. Haydu" is a 78 year old female who presents for a diabetes check.  Her fasting blood sugar today was 100 mg/dL and her average blood sugar was 113 mg/dL. Her last A1c, was 5.7%. She is currently taking Ozempic  weekly, which was restarted in November after a brief discontinuation. She monitors her weight daily, noting slight fluctuations, with her current weight at 138 lbs, the highest in the past three to four weeks.  She was previously noted to be anemic, a new finding compared to her previous visit. No blood in her stools, and a stool test for blood was negative. She has been taking [place] children's vitamins with iron after her blood donation was declined due to low levels. She missed taking her iron pills for the past two days but has been generally compliant since her last visit.  Her cholesterol levels were checked last time, showing an LDL of 99 mg/dL, which is above her goal of less than 70 mg/dL. She had been out of Repatha  but has resumed it for the past two weeks.  Her liver enzymes were previously elevated but have improved, with her AST now at 42 U/L.  Her current medications include hydralazine  three times a day, Imdur , Synthroid  88  mcg, olmesartan  20 mg for blood pressure, and potassium twice a day. She stopped taking calcium  supplements after running out and not finding her usual over-the-counter brand.     Diabetes She presents for her follow-up diabetic visit. She has type 2 diabetes mellitus. Pertinent negatives for hypoglycemia include no headaches. Pertinent negatives for diabetes include no blurred vision, no polydipsia, no polyphagia and no polyuria. There are no hypoglycemic complications. Diabetic complications include heart disease and nephropathy. Risk factors for coronary artery disease include diabetes mellitus, dyslipidemia, hypertension and post-menopausal. She is following a diabetic diet. She participates in exercise intermittently. An ACE inhibitor/angiotensin II receptor blocker is being taken. Eye exam is current.  Hypertension This is a chronic problem. The current episode started more than 1 year ago. The problem has been gradually improving since onset. The problem is controlled. Pertinent negatives include no blurred vision or headaches. The current treatment provides moderate improvement. 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     Past Medical History:  Diagnosis Date   Benign hypertensive renal disease    CKD stage 2 due to type 2 diabetes mellitus (HCC)    Hypertension    Hypothyroidism    Obesity    OSA on CPAP    Pure hypercholesterolemia    PVD (peripheral vascular disease) (HCC)    Vitamin D deficiency      Family History  Problem Relation Age of Onset   Alzheimer's disease Mother  Aneurysm Father    Cancer Brother      Current Outpatient Medications:    ALPHAGAN  P 0.1 % SOLN, , Disp: , Rfl:    amLODipine  (NORVASC ) 2.5 MG tablet, TAKE 1 TABLET BY MOUTH DAILY, Disp: 90 tablet, Rfl: 3   Apoaequorin (PREVAGEN PO), Take 1 tablet by mouth daily., Disp: , Rfl:    aspirin  EC 81 MG tablet, Take 81 mg by mouth daily., Disp: , Rfl:    b complex vitamins  tablet, Take 1 tablet by mouth daily with lunch. , Disp: , Rfl:    Blood Glucose Monitoring Suppl (ONETOUCH VERIO FLEX SYSTEM) w/Device KIT, USE AS DIRECTED TO CHECK BLOOD GLUCOSE 3 TIMES A DAY., Disp: 1 kit, Rfl: 0   Carboxymethylcellulose Sodium (ARTIFICIAL TEARS OP), Apply to eye., Disp: , Rfl:    CEQUA  0.09 % SOLN, Apply 1 drop to eye 2 (two) times daily., Disp: , Rfl:    cholecalciferol (VITAMIN D) 25 MCG (1000 UNIT) tablet, Take 1 tablet by mouth daily. , Disp: , Rfl:    cilostazol  (PLETAL ) 100 MG tablet, Take 1 tablet (100 mg total) by mouth daily., Disp: 90 tablet, Rfl: 2   Evolocumab  (REPATHA  SURECLICK) 140 MG/ML SOAJ, Inject 140 mg into the skin every 14 (fourteen) days., Disp: 6 mL, Rfl: 3   furosemide  (LASIX ) 20 MG tablet, TAKE 1 TABLET BY MOUTH DAILY, Disp: 90 tablet, Rfl: 3   glucose blood test strip, Use as instructed, Disp: 100 each, Rfl: 12   hydrALAZINE  (APRESOLINE ) 10 MG tablet, Take 1 tablet (10 mg total) by mouth 3 (three) times daily., Disp: 270 tablet, Rfl: 3   isosorbide  mononitrate (IMDUR ) 60 MG 24 hr tablet, TAKE 1 TABLET BY MOUTH DAILY, Disp: 90 tablet, Rfl: 3   levothyroxine  (SYNTHROID ) 88 MCG tablet, TAKE 1 TABLET BY MOUTH DAILY  BEFORE BREAKFAST, Disp: 90 tablet, Rfl: 3   Multiple Vitamins-Minerals (MULTIVITAMIN WITH MINERALS) tablet, Take 1 tablet by mouth daily with lunch. , Disp: , Rfl:    nitroGLYCERIN  (NITROSTAT ) 0.4 MG SL tablet, PLACE 1 TABLET UNDER THE TONGUE EVERY 5 MINUTES X 3 DOSES AS NEEDED FOR CHEST PAIN., Disp: 100 tablet, Rfl: 1   olmesartan  (BENICAR ) 20 MG tablet, Take 1 tablet (20 mg total) by mouth daily., Disp: 90 tablet, Rfl: 2   Polyethyl Glycol-Propyl Glycol (SYSTANE OP), Apply to eye., Disp: , Rfl:    potassium chloride  (KLOR-CON ) 10 MEQ tablet, TAKE 1 TABLET BY MOUTH TWICE  DAILY WITH BREAKFAST AND LUNCH, Disp: 180 tablet, Rfl: 3   Semaglutide ,0.25 or 0.5MG /DOS, (OZEMPIC , 0.25 OR 0.5 MG/DOSE,) 2 MG/3ML SOPN, Inject 0.5mg  sq once weekly, Disp: 3  mL, Rfl: 2   Allergies  Allergen Reactions   Atorvastatin Swelling and Other (See Comments)    Limbs swell   Clopidogrel  Other (See Comments)    Caused bruising   Metoprolol  Other (See Comments)    Slows heart rate too low   Metoprolol  Tartrate Swelling and Other (See Comments)    Site of swelling not noted   Pregabalin Swelling and Other (See Comments)    Site of swelling not noted   Rosuvastatin  Calcium  Other (See Comments)    Unknown reaction     Review of Systems  Constitutional: Negative.   Eyes:  Negative for blurred vision.  Respiratory: Negative.    Cardiovascular: Negative.   Gastrointestinal: Negative.   Endocrine: Negative for polydipsia, polyphagia and polyuria.  Neurological: Negative.  Negative for headaches.  Psychiatric/Behavioral: Negative.  Today's Vitals   07/03/23 1047  BP: 114/60  Pulse: (!) 59  Temp: 98 F (36.7 C)  SpO2: 98%  Weight: 138 lb (62.6 kg)  Height: 5\' 2"  (1.575 m)   Body mass index is 25.24 kg/m.  Wt Readings from Last 3 Encounters:  07/03/23 138 lb (62.6 kg)  07/03/23 138 lb 12.8 oz (63 kg)  05/27/23 137 lb 6.4 oz (62.3 kg)     Objective:  Physical Exam Vitals and nursing note reviewed.  Constitutional:      Appearance: Normal appearance.  HENT:     Head: Normocephalic and atraumatic.  Eyes:     Extraocular Movements: Extraocular movements intact.  Cardiovascular:     Rate and Rhythm: Normal rate and regular rhythm.     Heart sounds: Murmur heard.  Pulmonary:     Effort: Pulmonary effort is normal.     Breath sounds: Normal breath sounds.  Musculoskeletal:     Cervical back: Normal range of motion.  Skin:    General: Skin is warm.  Neurological:     General: No focal deficit present.     Mental Status: She is alert.  Psychiatric:        Mood and Affect: Mood normal.        Behavior: Behavior normal.   IMAGING EXAM: ABDOMEN ULTRASOUND COMPLETE   COMPARISON:  02/25/2020   FINDINGS: Gallbladder:  Nonshadowing calculi layer dependently within the gallbladder measuring up to 11 mm. No gallbladder wall thickening or pericholecystic fluid.   Common bile duct: Diameter: 4 mm   Liver: Heterogeneous increased liver echotexture most consistent with hepatic steatosis. No focal parenchymal liver abnormality. Portal vein is patent on color Doppler imaging with normal direction of blood flow towards the liver.   IVC: No abnormality visualized.   Pancreas: Visualized portion unremarkable.   Spleen: The spleen is not enlarged. Multiple cysts are again identified, measuring up to 1.6 cm, decreased in size since prior CT.   Right Kidney: Length: 9.0 cm. Echogenicity within normal limits. No mass or hydronephrosis visualized.   Left Kidney: Length: 9.4 cm. Echogenicity within normal limits. Cystic area within the inferior aspect of the renal pelvis may reflect focal caliectasis versus a simple peripelvic cyst, measuring up to 1.4 cm. On the transverse image through the lower pole, there may be a 1.9 cm complex hypoechoic lesion, not adequately visualized in the longitudinal view. This is best seen on image 124 of series 1.   Abdominal aorta: No aneurysm visualized.   Other findings: None.   IMPRESSION: 1. Cholelithiasis without evidence of cholecystitis. 2. Heterogeneous increased liver echotexture consistent with hepatic steatosis. 3. Indeterminate 1.9 cm hypoechoic lesion lower pole left kidney. Further evaluation with dedicated renal CT or MRI may be useful to exclude solid lesion.   These results will be called to the ordering clinician or representative by the Radiologist Assistant, and communication documented in the PACS or [employer].     Electronically Signed   By: Bobbye Burrow M.D.   On: 11/06/2022 14:38      Assessment And Plan:  Type 2 diabetes mellitus with stage 3a chronic kidney disease, without long-term current use of insulin  (HCC) Assessment &  Plan: Blood glucose levels stable. Hemoglobin A1c at 5.7% indicates good control. Discussed malnutrition risk with Ozempic . - Continue Ozempic  weekly, importance of adequate protein intake and strength training was discussed with the patient - Monitor blood glucose levels regularly. - Reassess diabetes management in July.  Orders: -  Microalbumin / creatinine urine ratio  Hypertensive heart and renal disease with renal failure, stage 1 through stage 4 or unspecified chronic kidney disease, without heart failure Assessment & Plan: Chronic, well controlled.She will c/w furosemide  20mg  daily, hydralazine  10mg  po tid, amlodipine  2.5mg  daily and olmesartan  20mg  daily. She is also on Imdur  60mg  daily. She is encouraged to follow low sodium diet.     CAD in native artery Assessment & Plan: Chronic, she is also followed by Cardiology, Dr. Sharyn Deforest.  Importance of medication/dietary compliance was discussed with the patient. She is on ASA, isosorbide  and ARB therapy.   She is encouraged to follow heart healthy lifestyle.    Pure hypercholesterolemia Assessment & Plan: LDL cholesterol at 99 mg/dL, above target. Resumed Repatha  two weeks ago. - Continue Repatha . - Reassess lipid panel in July.   Elevated liver enzymes Assessment & Plan: AST slightly elevated at 42 U/L. Improvement noted. - Reassess liver enzymes in July.   Other specified disorders of kidney and ureter Assessment & Plan: Indeterminate 1.9 cm hypoechoic lesion lower pole left kidney seen on August 2024 ultrasound. CT scan of abdomen and kidneys was ordered for further evaluation, yet not completed. She has no contrast allergies. Discussed need for CT scan. - Consult referral coordinator to determine why CT scan was not completed.  Orders: -     CT RENAL ABDOMEN W WO CONTRAST; Future  Anemia, unspecified type Assessment & Plan: Most recent CBC reviewed, she has improved hemoglobin levels. Negative fecal occult blood  test. Will do repeat stool cards. - Check iron panel today. - Continue taking iron supplements as indicated.  Orders: -     Iron, TIBC and Ferritin Panel -     Vitamin B12  Drug therapy   Return if symptoms worsen or fail to improve, for 1 year ov w rs & thn. .  Patient was given opportunity to ask questions. Patient verbalized understanding of the plan and was able to repeat key elements of the plan. All questions were answered to their satisfaction.    I, Smiley Dung, MD, have reviewed all documentation for this visit. The documentation on 07/03/23 for the exam, diagnosis, procedures, and orders are all accurate and complete.   IF YOU HAVE BEEN REFERRED TO A SPECIALIST, IT MAY TAKE 1-2 WEEKS TO SCHEDULE/PROCESS THE REFERRAL. IF YOU HAVE NOT HEARD FROM US /SPECIALIST IN TWO WEEKS, PLEASE GIVE US  A CALL AT [phone].   THE PATIENT IS ENCOURAGED TO PRACTICE SOCIAL DISTANCING DUE TO THE COVID-19 PANDEMIC.

## 2023-07-03 NOTE — Patient Instructions (Signed)

## 2023-07-03 NOTE — Progress Notes (Signed)
 Subjective:   [name] is a 78 y.o. who presents for a Medicare Wellness preventive visit.  Visit Complete: In person    Persons Participating in Visit: Patient.  AWV Questionnaire: No: Patient Medicare AWV questionnaire was not completed prior to this visit.  Cardiac Risk Factors include: advanced age (>69men, >30 women);diabetes mellitus;dyslipidemia;hypertension     Objective:    Today's Vitals   07/03/23 1033  BP: 114/60  Pulse: (!) 59  Temp: 98 F (36.7 C)  TempSrc: Oral  SpO2: 95%  Weight: 138 lb 12.8 oz (63 kg)  Height: 5\' 2"  (1.575 m)   Body mass index is 25.39 kg/m.     07/03/2023   10:41 AM 06/20/2022   10:39 AM 05/31/2021   11:27 AM 05/26/2020   10:52 AM 05/27/2019   10:12 AM 05/21/2018   10:08 AM 12/24/2017    7:45 AM  Advanced Directives  Does Patient Have a Medical Advance Directive? No No No No No No No  Would patient like information on creating a medical advance directive? No - Patient declined  Yes (MAU/Ambulatory/Procedural Areas - Information given) No - Patient declined Yes (MAU/Ambulatory/Procedural Areas - Information given) Yes (MAU/Ambulatory/Procedural Areas - Information given) No - Patient declined    Current Medications (verified) Outpatient Encounter Medications as of 07/03/2023  Medication Sig   ALPHAGAN P 0.1 % SOLN    amLODipine (NORVASC) 2.5 MG tablet TAKE 1 TABLET BY MOUTH DAILY   Apoaequorin (PREVAGEN PO) Take 1 tablet by mouth daily.   aspirin EC 81 MG tablet Take 81 mg by mouth daily.   b complex vitamins tablet Take 1 tablet by mouth daily with lunch.    Blood Glucose Monitoring Suppl (ONETOUCH VERIO FLEX SYSTEM) w/Device KIT USE AS DIRECTED TO CHECK BLOOD GLUCOSE 3 TIMES A DAY.   Carboxymethylcellulose Sodium (ARTIFICIAL TEARS OP) Apply to eye.   CEQUA 0.09 % SOLN Apply 1 drop to eye 2 (two) times daily.   cholecalciferol (VITAMIN D) 25 MCG (1000 UNIT) tablet Take 1 tablet by mouth daily.    cilostazol (PLETAL) 100 MG  tablet Take 1 tablet (100 mg total) by mouth daily.   Evolocumab (REPATHA SURECLICK) 140 MG/ML SOAJ Inject 140 mg into the skin every 14 (fourteen) days.   furosemide (LASIX) 20 MG tablet TAKE 1 TABLET BY MOUTH DAILY   glucose blood test strip Use as instructed   hydrALAZINE (APRESOLINE) 10 MG tablet Take 1 tablet (10 mg total) by mouth 3 (three) times daily.   isosorbide mononitrate (IMDUR) 60 MG 24 hr tablet TAKE 1 TABLET BY MOUTH DAILY   levothyroxine (SYNTHROID) 88 MCG tablet TAKE 1 TABLET BY MOUTH DAILY  BEFORE BREAKFAST   Multiple Vitamins-Minerals (MULTIVITAMIN WITH MINERALS) tablet Take 1 tablet by mouth daily with lunch.    nitroGLYCERIN (NITROSTAT) 0.4 MG SL tablet PLACE 1 TABLET UNDER THE TONGUE EVERY 5 MINUTES X 3 DOSES AS NEEDED FOR CHEST PAIN.   olmesartan (BENICAR) 20 MG tablet Take 1 tablet (20 mg total) by mouth daily.   Polyethyl Glycol-Propyl Glycol (SYSTANE OP) Apply to eye.   potassium chloride (KLOR-CON) 10 MEQ tablet TAKE 1 TABLET BY MOUTH TWICE  DAILY WITH BREAKFAST AND LUNCH   Semaglutide,0.25 or 0.5MG /DOS, (OZEMPIC, 0.25 OR 0.5 MG/DOSE,) 2 MG/3ML SOPN Inject 0.5mg  sq once weekly   [DISCONTINUED] Calcium Carb-Cholecalciferol (CALCIUM + D3 PO) Take 1 tablet by mouth at bedtime. (Patient not taking: Reported on 07/03/2023)   No facility-administered encounter medications on file as of 07/03/2023.  Allergies (verified) Atorvastatin, Clopidogrel, Metoprolol, Metoprolol tartrate, Pregabalin, and Rosuvastatin calcium   History: Past Medical History:  Diagnosis Date   Benign hypertensive renal disease    CKD stage 2 due to type 2 diabetes mellitus (HCC)    Hypertension    Hypothyroidism    Obesity    OSA on CPAP    Pure hypercholesterolemia    PVD (peripheral vascular disease) (HCC)    Vitamin D deficiency    Past Surgical History:  Procedure Laterality Date   ABDOMINAL HYSTERECTOMY     BREAST CYST EXCISION Right over 20 yrs   broken leg and displaced ankle      DILATION AND CURETTAGE OF UTERUS     LEFT HEART CATH AND CORONARY ANGIOGRAPHY N/A 06/13/2017   Procedure: LEFT HEART CATH AND CORONARY ANGIOGRAPHY;  Surgeon: Orpah Cobb, MD;  Location: [facility];  Service: Cardiovascular;  Laterality: N/A;   right and left rotator cuff     THYROIDECTOMY     Family History  Problem Relation Age of Onset   Alzheimer's disease Mother    Aneurysm Father    Cancer Brother    Social History   Socioeconomic History   Marital status: Married    Spouse name: Not on file   Number of children: 3   Years of education: Not on file   Highest education level: Not on file  Occupational History   Occupation: retired  Tobacco Use   Smoking status: Never   Smokeless tobacco: Never  Vaping Use   Vaping status: Never Used  Substance and Sexual Activity   Alcohol use: No    Alcohol/week: 0.0 standard drinks of alcohol    Comment: occasional wine   Drug use: No   Sexual activity: Not Currently  Other Topics Concern   Not on file  Social History Narrative   Drinks 1-2 cups daily of coffee daily.   Social Drivers of Corporate investment banker Strain: Low Risk  (07/03/2023)   Overall Financial Resource Strain (CARDIA)    Difficulty of Paying Living Expenses: Not hard at all  Food Insecurity: No Food Insecurity (07/03/2023)   Hunger Vital Sign    Worried About Running Out of Food in the Last Year: Never true    Ran Out of Food in the Last Year: Never true  Transportation Needs: No Transportation Needs (07/03/2023)   PRAPARE - Administrator, Civil Service (Medical): No    Lack of Transportation (Non-Medical): No  Physical Activity: Inactive (07/03/2023)   Exercise Vital Sign    Days of Exercise per Week: 0 days    Minutes of Exercise per Session: 0 min  Stress: No Stress Concern Present (07/03/2023)   [employer] of Occupational Health - Occupational Stress Questionnaire    Feeling of Stress : Not at all  Social Connections:  Moderately Integrated (07/03/2023)   Social Connection and Isolation Panel [NHANES]    Frequency of Communication with Friends and Family: More than three times a week    Frequency of Social Gatherings with Friends and Family: Once a week    Attends Religious Services: More than 4 times per year    Active Member of [employer] or Organizations: No    Attends Banker Meetings: Never    Marital Status: Married    Tobacco Counseling Counseling given: Not Answered    Clinical Intake:  Pre-visit preparation completed: Yes  Pain : No/denies pain     Nutritional Status: BMI 25 -  29 Overweight Nutritional Risks: None Diabetes: Yes CBG done?: No Did pt. bring in CBG monitor from home?: No  Lab Results  Component Value Date   HGBA1C 5.7 (H) 05/27/2023   HGBA1C 5.6 10/22/2022   HGBA1C 5.8 (H) 06/20/2022     How often do you need to have someone help you when you read instructions, pamphlets, or other written materials from your doctor or pharmacy?: 1 - Never  Interpreter Needed?: No  Information entered by :: NAllen LPN   Activities of Daily Living     07/03/2023   10:35 AM  In your present state of health, do you have any difficulty performing the following activities:  Hearing? 0  Vision? 1  Comment waiting for new glasses  Difficulty concentrating or making decisions? 0  Walking or climbing stairs? 0  Dressing or bathing? 0  Doing errands, shopping? 0  Preparing Food and eating ? N  Using the Toilet? N  In the past six months, have you accidently leaked urine? N  Do you have problems with loss of bowel control? N  Managing your Medications? N  Managing your Finances? N  Housekeeping or managing your Housekeeping? N    Patient Care Team: [name], MD as PCP - General (Internal Medicine) Chalmers Guest, MD as Consulting Physician (Ophthalmology) Harlan Stains, [facility] (Inactive) (Pharmacist)  Indicate any recent Medical Services you may have received  from other than [facility] providers in the past year (date may be approximate).     Assessment:   This is a routine wellness examination for [name].  Hearing/Vision screen Hearing Screening - Comments:: Denies hearing issues Vision Screening - Comments:: Regular eye exams, Dr. Harlon Flor   Goals Addressed             This Visit's Progress    Patient Stated       07/03/2023, denies goals       Depression Screen     07/03/2023   10:43 AM 06/20/2022   10:39 AM 05/31/2021   11:28 AM 05/26/2020   10:52 AM 09/30/2019   10:01 AM 05/27/2019   10:13 AM 02/12/2019    9:19 AM  PHQ 2/9 Scores  PHQ - 2 Score 0 0 0 0 0 0 0  PHQ- 9 Score 0    0 1 4    Fall Risk     07/03/2023   10:42 AM 06/20/2022   10:39 AM 05/31/2021   11:28 AM 05/26/2020   10:52 AM 05/27/2019   10:13 AM  Fall Risk   Falls in the past year? 0 0 0 0 0  Number falls in past yr: 0 0     Injury with Fall? 0 0     Risk for fall due to : Medication side effect Medication side effect Medication side effect Medication side effect Medication side effect  Follow up Falls prevention discussed;Falls evaluation completed Falls prevention discussed;Education provided;Falls evaluation completed Falls evaluation completed;Education provided;Falls prevention discussed Falls evaluation completed;Education provided;Falls prevention discussed Falls evaluation completed;Education provided;Falls prevention discussed    MEDICARE RISK AT HOME:  Medicare Risk at Home Any stairs in or around the home?: Yes If so, are there any without handrails?: No Home free of loose throw rugs in walkways, pet beds, electrical cords, etc?: Yes Adequate lighting in your home to reduce risk of falls?: Yes Life alert?: No Use of a cane, walker or w/c?: No Grab bars in the bathroom?: No Shower chair or bench in shower?: No Elevated toilet seat  or a handicapped toilet?: Yes  TIMED UP AND GO:  Was the test performed?  No  Cognitive Function: 6CIT completed         07/03/2023   10:44 AM 06/20/2022   10:40 AM 05/31/2021   11:30 AM 05/26/2020   10:54 AM 05/27/2019   10:15 AM  6CIT Screen  What Year? 0 points 0 points 0 points 0 points 0 points  What month? 0 points 0 points 0 points 0 points 0 points  What time? 0 points 0 points 0 points 0 points 0 points  Count back from 20 0 points 0 points 0 points 0 points 0 points  Months in reverse 0 points 0 points 2 points 0 points 0 points  Repeat phrase 0 points 2 points 2 points 2 points 0 points  Total Score 0 points 2 points 4 points 2 points 0 points    Immunizations Immunization History  Administered Date(s) Administered   Fluad Quad(high Dose 65+) 02/08/2021, 02/12/2022   Influenza, High Dose Seasonal PF 03/30/2014, 01/31/2015, 04/04/2016, 12/18/2016, 11/13/2018, 01/04/2020   Influenza-Unspecified 01/24/2013, 12/21/2017, 04/27/2023   Moderna Covid-19 Vaccine Bivalent Booster 22yrs & up 06/20/2021   Moderna SARS-COV2 Booster Vaccination 02/05/2020   Moderna Sars-Covid-2 Vaccination 05/13/2019, 06/10/2019, 08/12/2020   PNEUMOCOCCAL CONJUGATE-20 02/23/2021   Pfizer(Comirnaty)Fall Seasonal Vaccine 12 years and older 04/08/2023   Pneumococcal Polysaccharide-23 12/27/2016   Tdap 04/13/2022   Unspecified SARS-COV-2 Vaccination 03/01/2022   Zoster Recombinant(Shingrix) 06/07/2021, 10/11/2021   Zoster, Live 02/03/2013    Screening Tests Health Maintenance  Topic Date Due   Diabetic kidney evaluation - Urine ACR  06/20/2023   COVID-19 Vaccine (7 - Moderna risk 2024-25 season) 10/06/2023   FOOT EXAM  10/22/2023   INFLUENZA VACCINE  10/25/2023   HEMOGLOBIN A1C  11/27/2023   Diabetic kidney evaluation - eGFR measurement  05/26/2024   OPHTHALMOLOGY EXAM  06/24/2024   Medicare Annual Wellness (AWV)  07/02/2024   DTaP/Tdap/Td (2 - Td or Tdap) 04/13/2032   Pneumonia Vaccine 68+ Years old  Completed   DEXA SCAN  Completed   Hepatitis C Screening  Completed   Zoster Vaccines- Shingrix  Completed   HPV  VACCINES  Aged Out   Colonoscopy  Discontinued    Health Maintenance  Health Maintenance Due  Topic Date Due   Diabetic kidney evaluation - Urine ACR  06/20/2023   Health Maintenance Items Addressed: UACR (Urine Albumin:Creatinine Ratio)  Additional Screening:  Vision Screening: Recommended annual ophthalmology exams for early detection of glaucoma and other disorders of the eye.  Dental Screening: Recommended annual dental exams for proper oral hygiene  Community Resource Referral / Chronic Care Management: CRR required this visit?  No   CCM required this visit?  No     Plan:     I have personally reviewed and noted the following in the patient's chart:   Medical and social history Use of alcohol, tobacco or illicit drugs  Current medications and supplements including opioid prescriptions. Patient is not currently taking opioid prescriptions. Functional ability and status Nutritional status Physical activity Advanced directives List of other physicians Hospitalizations, surgeries, and ER visits in previous 12 months Vitals Screenings to include cognitive, depression, and falls Referrals and appointments  In addition, I have reviewed and discussed with patient certain preventive protocols, quality metrics, and best practice recommendations. A written personalized care plan for preventive services as well as general preventive health recommendations were provided to patient.     Barb Merino, LPN   05/05/5619  After Visit Summary: (In Person-Printed) AVS printed and given to the patient  Notes: Nothing significant to report at this time.

## 2023-07-04 ENCOUNTER — Telehealth: Payer: Self-pay

## 2023-07-04 LAB — MICROALBUMIN / CREATININE URINE RATIO
Creatinine, Urine: 69 mg/dL
Microalb/Creat Ratio: 6 mg/g{creat} (ref 0–29)
Microalbumin, Urine: 4.3 ug/mL

## 2023-07-04 LAB — IRON,TIBC AND FERRITIN PANEL
Ferritin: 17 ng/mL (ref 15–150)
Iron Saturation: 8 % — CL (ref 15–55)
Iron: 34 ug/dL (ref 27–139)
Total Iron Binding Capacity: 415 ug/dL (ref 250–450)
UIBC: 381 ug/dL — ABNORMAL HIGH (ref 118–369)

## 2023-07-04 LAB — VITAMIN B12: Vitamin B-12: >2000 pg/mL — ABNORMAL HIGH (ref 232–1245)

## 2023-07-04 NOTE — Telephone Encounter (Signed)
 Patient aware, iron lab work came back very low. She was advised to begin starting iron supplement. Patient given samples. Rx sent.

## 2023-07-08 ENCOUNTER — Encounter: Payer: Self-pay | Admitting: Internal Medicine

## 2023-07-12 DIAGNOSIS — D649 Anemia, unspecified: Secondary | ICD-10-CM | POA: Insufficient documentation

## 2023-07-12 DIAGNOSIS — R93429 Abnormal radiologic findings on diagnostic imaging of unspecified kidney: Secondary | ICD-10-CM | POA: Insufficient documentation

## 2023-07-12 DIAGNOSIS — N2889 Other specified disorders of kidney and ureter: Secondary | ICD-10-CM | POA: Insufficient documentation

## 2023-07-12 DIAGNOSIS — R748 Abnormal levels of other serum enzymes: Secondary | ICD-10-CM | POA: Insufficient documentation

## 2023-07-12 NOTE — Assessment & Plan Note (Signed)
 Blood glucose levels stable. Hemoglobin A1c at 5.7% indicates good control. Discussed malnutrition risk with Ozempic . - Continue Ozempic  weekly, importance of adequate protein intake and strength training was discussed with the patient - Monitor blood glucose levels regularly. - Reassess diabetes management in July.

## 2023-07-12 NOTE — Assessment & Plan Note (Signed)
 AST slightly elevated at 42 U/L. Improvement noted. - Reassess liver enzymes in July.

## 2023-07-12 NOTE — Assessment & Plan Note (Signed)
 Chronic, well controlled.She will c/w furosemide 20mg  daily, hydralazine 10mg  po tid, amlodipine 2.5mg  daily and olmesartan 20mg  daily. She is also on Imdur 60mg  daily. She is encouraged to follow low sodium diet.

## 2023-07-12 NOTE — Assessment & Plan Note (Signed)
 Indeterminate 1.9 cm hypoechoic lesion lower pole left kidney seen on August 2024 ultrasound. CT scan of abdomen and kidneys was ordered for further evaluation, yet not completed. She has no contrast allergies. Discussed need for CT scan. - Consult referral coordinator to determine why CT scan was not completed.

## 2023-07-12 NOTE — Assessment & Plan Note (Deleted)
 New anemia with improved hemoglobin levels. Negative fecal occult blood test. - Check iron panel today. - Continue taking iron supplements as needed.

## 2023-07-12 NOTE — Assessment & Plan Note (Signed)
Chronic, she is also followed by Cardiology, Dr. Algie Coffer.  Importance of medication/dietary compliance was discussed with the patient. She is on ASA, isosorbide and ARB therapy.   She is encouraged to follow heart healthy lifestyle.

## 2023-07-12 NOTE — Assessment & Plan Note (Addendum)
 Most recent CBC reviewed, she has improved hemoglobin levels. Negative fecal occult blood test. Will do repeat stool cards. - Check iron panel today. - Continue taking iron supplements as indicated.

## 2023-07-12 NOTE — Assessment & Plan Note (Signed)
 LDL cholesterol at 99 mg/dL, above target. Resumed Repatha  two weeks ago. - Continue Repatha . - Reassess lipid panel in July.

## 2023-07-24 ENCOUNTER — Ambulatory Visit
Admission: RE | Admit: 2023-07-24 | Discharge: 2023-07-24 | Disposition: A | Discharge status: Home / Self Care | Source: Ambulatory Visit | Attending: Internal Medicine | Admitting: Internal Medicine

## 2023-07-24 DIAGNOSIS — N2889 Other specified disorders of kidney and ureter: Secondary | ICD-10-CM

## 2023-07-24 MED ORDER — IOPAMIDOL (ISOVUE-300) INJECTION 61%
500.0000 mL | Freq: Once | INTRAVENOUS | Status: AC | PRN
  Administered 2023-07-24: 100 mL via INTRAVENOUS

## 2023-07-26 ENCOUNTER — Encounter: Payer: Self-pay | Admitting: Internal Medicine

## 2023-07-26 ENCOUNTER — Other Ambulatory Visit: Payer: Self-pay | Admitting: Internal Medicine

## 2023-07-26 DIAGNOSIS — R93429 Abnormal radiologic findings on diagnostic imaging of unspecified kidney: Secondary | ICD-10-CM

## 2023-09-02 ENCOUNTER — Telehealth: Payer: Self-pay | Admitting: Pharmacist

## 2023-09-02 DIAGNOSIS — E7800 Pure hypercholesterolemia, unspecified: Secondary | ICD-10-CM

## 2023-09-02 NOTE — Progress Notes (Signed)
   09/02/2023  Patient ID: [name], female   DOB: Sep 25, 1945, 78 y.o.   MRN: [id number]  Patient called and said there was an issue with her Repatha . She said she was supposed to have had her injection yesterday but could not get it due to an issue at the Pharmacy. [facility] was called on the Patient's behalf. They had the prescription ready at  $0 copay.  Patient has been approved through [employer] [facility] to help with the copay so her Repatha  has to be billed to both her primary insurance and the BIN/PCN for [employer].    Called Patient back and let her know. She said she would go and pick it up today.  She is statin intolerant. Will send note to Dr. Elnita Hai prior to her upcoming appointment on 10/23/23 to be sure statin intolerance coding is added to the visit.   Geronimo Krabbe, PharmD, BCACP Clinical Pharmacist [phone]

## 2023-09-09 ENCOUNTER — Other Ambulatory Visit: Payer: Self-pay | Admitting: Internal Medicine

## 2023-09-09 DIAGNOSIS — E1122 Type 2 diabetes mellitus with diabetic chronic kidney disease: Secondary | ICD-10-CM | POA: Diagnosis not present

## 2023-09-09 DIAGNOSIS — I129 Hypertensive chronic kidney disease with stage 1 through stage 4 chronic kidney disease, or unspecified chronic kidney disease: Secondary | ICD-10-CM | POA: Diagnosis not present

## 2023-09-09 DIAGNOSIS — N1831 Chronic kidney disease, stage 3a: Secondary | ICD-10-CM | POA: Diagnosis not present

## 2023-09-09 DIAGNOSIS — N2889 Other specified disorders of kidney and ureter: Secondary | ICD-10-CM | POA: Diagnosis not present

## 2023-09-16 ENCOUNTER — Other Ambulatory Visit: Payer: Self-pay

## 2023-09-16 DIAGNOSIS — I739 Peripheral vascular disease, unspecified: Secondary | ICD-10-CM

## 2023-09-16 DIAGNOSIS — D49512 Neoplasm of unspecified behavior of left kidney: Secondary | ICD-10-CM | POA: Diagnosis not present

## 2023-09-16 MED ORDER — CILOSTAZOL 100 MG PO TABS
100.0000 mg | ORAL_TABLET | Freq: Every day | ORAL | 2 refills | Status: DC
Start: 2023-09-16 — End: 2023-12-23

## 2023-09-18 ENCOUNTER — Other Ambulatory Visit: Payer: Self-pay | Admitting: Urology

## 2023-09-18 DIAGNOSIS — D49512 Neoplasm of unspecified behavior of left kidney: Secondary | ICD-10-CM

## 2023-10-14 DIAGNOSIS — D49512 Neoplasm of unspecified behavior of left kidney: Secondary | ICD-10-CM | POA: Diagnosis not present

## 2023-10-21 DIAGNOSIS — D49512 Neoplasm of unspecified behavior of left kidney: Secondary | ICD-10-CM | POA: Diagnosis not present

## 2023-10-23 ENCOUNTER — Encounter: Payer: Medicare Other | Admitting: Internal Medicine

## 2023-10-24 ENCOUNTER — Ambulatory Visit (INDEPENDENT_AMBULATORY_CARE_PROVIDER_SITE_OTHER): Admitting: Internal Medicine

## 2023-10-24 ENCOUNTER — Encounter: Payer: Self-pay | Admitting: Internal Medicine

## 2023-10-24 VITALS — BP 110/70 | HR 50 | Temp 98.5°F | Ht 62.0 in | Wt 136.0 lb

## 2023-10-24 DIAGNOSIS — N289 Disorder of kidney and ureter, unspecified: Secondary | ICD-10-CM

## 2023-10-24 DIAGNOSIS — H2513 Age-related nuclear cataract, bilateral: Secondary | ICD-10-CM | POA: Diagnosis not present

## 2023-10-24 DIAGNOSIS — E039 Hypothyroidism, unspecified: Secondary | ICD-10-CM

## 2023-10-24 DIAGNOSIS — E1122 Type 2 diabetes mellitus with diabetic chronic kidney disease: Secondary | ICD-10-CM | POA: Diagnosis not present

## 2023-10-24 DIAGNOSIS — Z0000 Encounter for general adult medical examination without abnormal findings: Secondary | ICD-10-CM | POA: Diagnosis not present

## 2023-10-24 DIAGNOSIS — N1831 Chronic kidney disease, stage 3a: Secondary | ICD-10-CM | POA: Diagnosis not present

## 2023-10-24 DIAGNOSIS — I1310 Hypertensive heart and chronic kidney disease without heart failure, with stage 1 through stage 4 chronic kidney disease, or unspecified chronic kidney disease: Secondary | ICD-10-CM | POA: Diagnosis not present

## 2023-10-24 DIAGNOSIS — G720 Drug-induced myopathy: Secondary | ICD-10-CM

## 2023-10-24 DIAGNOSIS — H401131 Primary open-angle glaucoma, bilateral, mild stage: Secondary | ICD-10-CM | POA: Diagnosis not present

## 2023-10-24 DIAGNOSIS — H16223 Keratoconjunctivitis sicca, not specified as Sjogren's, bilateral: Secondary | ICD-10-CM | POA: Diagnosis not present

## 2023-10-24 DIAGNOSIS — T466X5A Adverse effect of antihyperlipidemic and antiarteriosclerotic drugs, initial encounter: Secondary | ICD-10-CM

## 2023-10-24 DIAGNOSIS — I2510 Atherosclerotic heart disease of native coronary artery without angina pectoris: Secondary | ICD-10-CM

## 2023-10-24 DIAGNOSIS — E119 Type 2 diabetes mellitus without complications: Secondary | ICD-10-CM | POA: Diagnosis not present

## 2023-10-24 LAB — POCT URINALYSIS DIP (CLINITEK)
Bilirubin, UA: NEGATIVE
Blood, UA: NEGATIVE
Glucose, UA: NEGATIVE mg/dL
Ketones, POC UA: NEGATIVE mg/dL
Nitrite, UA: NEGATIVE
POC PROTEIN,UA: NEGATIVE
Spec Grav, UA: 1.02 (ref 1.010–1.025)
Urobilinogen, UA: 0.2 U/dL
pH, UA: 6 (ref 5.0–8.0)

## 2023-10-24 NOTE — Patient Instructions (Signed)

## 2023-10-24 NOTE — Progress Notes (Signed)
 I,[name], CMA,acting as a Neurosurgeon for Catherine LOISE Slocumb, MD.,have documented all relevant documentation on the behalf of Catherine LOISE Slocumb, MD,as directed by  Catherine LOISE Slocumb, MD while in the presence of Catherine LOISE Slocumb, MD.  Subjective:    Patient ID: [name] , female    DOB: Mar 15, 1946 , 78 y.o.   MRN: [id number]  Chief Complaint  Patient presents with   Annual Exam    Patient presents today for annual exam. She reports compliance with medications. Denies headache, chest pain & sob.    Diabetes   Hypertension    HPI Discussed the use of AI scribe software for clinical note transcription with the patient, who gave verbal consent to proceed.  History of Present Illness [name] is a 78 year old female with diabetes who presents for a physical and diabetes check.  She is currently on Ozempic  0.5 mg, which she receives through patient assistance. Her blood sugar levels at home are described as 'very good', although she forgot to bring her log. She has lost two pounds recently and her appetite is 'okay', but admits she does not eat as much as she used to.  She is not engaging in regular exercise, only walking around the house. She experiences cramps in her feet and calves, particularly in the morning. No calf pain when moving around the house.  She has a history of a kidney lesion, which was evaluated by a urologist. A CT scan on July 28th showed a small renal cell carcinoma. There is no evidence of renal vein invasion, LAN or metastatic disease.  She states urologist advised her area was too small to remove. She will have f/u CT in six months. She has not experienced any blood in her stools despite a low blood count earlier this year.  Her current medications include olmesartan  20 mg for blood pressure, levothyroxine  88 mcg taken Monday through Friday, Repatha  for cholesterol, and amlodipine  2.5 mg. She does not tolerate traditional cholesterol  medications like simvastatin or atorvastatin due to past muscle aches.  She reports constipation, having bowel movements that are not regular. She consumes mixed nuts and cashews but does not like avocados.   Diabetes She presents for her follow-up diabetic visit. She has type 2 diabetes mellitus. Pertinent negatives for hypoglycemia include no headaches. Pertinent negatives for diabetes include no blurred vision, no polydipsia, no polyphagia and no polyuria. There are no hypoglycemic complications. Diabetic complications include heart disease and nephropathy. Risk factors for coronary artery disease include diabetes mellitus, dyslipidemia, hypertension and post-menopausal. She is following a diabetic diet. She participates in exercise intermittently. An ACE inhibitor/angiotensin II receptor blocker is being taken. Eye exam is current.  Hypertension This is a chronic problem. The current episode started more than 1 year ago. The problem has been gradually improving since onset. The problem is controlled. Pertinent negatives include no blurred vision or headaches. The current treatment provides moderate improvement. 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     Past Medical History:  Diagnosis Date   Benign hypertensive renal disease    CKD stage 2 due to type 2 diabetes mellitus (HCC)    Hypertension    Hypothyroidism    Obesity    OSA on CPAP    Pure hypercholesterolemia    PVD (peripheral vascular disease) (HCC)    Vitamin D deficiency      Family History  Problem Relation Age of Onset   Alzheimer's  disease Mother    Aneurysm Father    Cancer Brother      Current Outpatient Medications:    ALPHAGAN  P 0.1 % SOLN, , Disp: , Rfl:    amLODipine  (NORVASC ) 2.5 MG tablet, TAKE 1 TABLET BY MOUTH DAILY, Disp: 90 tablet, Rfl: 3   Apoaequorin (PREVAGEN PO), Take 1 tablet by mouth daily., Disp: , Rfl:    aspirin  EC 81 MG tablet, Take 81 mg by mouth daily., Disp: ,  Rfl:    b complex vitamins tablet, Take 1 tablet by mouth daily with lunch. , Disp: , Rfl:    Blood Glucose Monitoring Suppl (ONETOUCH VERIO FLEX SYSTEM) w/Device KIT, USE AS DIRECTED TO CHECK BLOOD GLUCOSE 3 TIMES A DAY., Disp: 1 kit, Rfl: 0   Carboxymethylcellulose Sodium (ARTIFICIAL TEARS OP), Apply to eye., Disp: , Rfl:    CEQUA  0.09 % SOLN, Apply 1 drop to eye 2 (two) times daily., Disp: , Rfl:    cholecalciferol (VITAMIN D) 25 MCG (1000 UNIT) tablet, Take 1 tablet by mouth daily. , Disp: , Rfl:    cilostazol  (PLETAL ) 100 MG tablet, Take 1 tablet (100 mg total) by mouth daily., Disp: 90 tablet, Rfl: 2   Evolocumab  (REPATHA  SURECLICK) 140 MG/ML SOAJ, Inject 140 mg into the skin every 14 (fourteen) days., Disp: 6 mL, Rfl: 3   furosemide  (LASIX ) 20 MG tablet, TAKE 1 TABLET BY MOUTH DAILY, Disp: 90 tablet, Rfl: 3   glucose blood test strip, Use as instructed, Disp: 100 each, Rfl: 12   hydrALAZINE  (APRESOLINE ) 10 MG tablet, Take 1 tablet (10 mg total) by mouth 3 (three) times daily., Disp: 270 tablet, Rfl: 3   isosorbide  mononitrate (IMDUR ) 60 MG 24 hr tablet, TAKE 1 TABLET BY MOUTH DAILY, Disp: 90 tablet, Rfl: 3   levothyroxine  (SYNTHROID ) 88 MCG tablet, TAKE 1 TABLET BY MOUTH DAILY  BEFORE BREAKFAST, Disp: 90 tablet, Rfl: 3   Multiple Vitamins-Minerals (MULTIVITAMIN WITH MINERALS) tablet, Take 1 tablet by mouth daily with lunch. , Disp: , Rfl:    nitroGLYCERIN  (NITROSTAT ) 0.4 MG SL tablet, PLACE 1 TABLET UNDER THE TONGUE EVERY 5 MINUTES X 3 DOSES AS NEEDED FOR CHEST PAIN., Disp: 100 tablet, Rfl: 1   olmesartan  (BENICAR ) 20 MG tablet, TAKE 1 TABLET BY MOUTH DAILY, Disp: 90 tablet, Rfl: 3   Polyethyl Glycol-Propyl Glycol (SYSTANE OP), Apply to eye., Disp: , Rfl:    potassium chloride  (KLOR-CON ) 10 MEQ tablet, TAKE 1 TABLET BY MOUTH TWICE  DAILY WITH BREAKFAST AND LUNCH, Disp: 180 tablet, Rfl: 3   Semaglutide ,0.25 or 0.5MG /DOS, (OZEMPIC , 0.25 OR 0.5 MG/DOSE,) 2 MG/3ML SOPN, Inject 0.5mg  sq once  weekly, Disp: 3 mL, Rfl: 2   Allergies  Allergen Reactions   Atorvastatin Swelling and Other (See Comments)    Limbs swell   Clopidogrel  Other (See Comments)    Caused bruising   Metoprolol  Other (See Comments)    Slows heart rate too low   Metoprolol  Tartrate Swelling and Other (See Comments)    Site of swelling not noted   Pregabalin Swelling and Other (See Comments)    Site of swelling not noted   Rosuvastatin  Calcium  Other (See Comments)    Unknown reaction      The patient states she uses post menopausal status for birth control. No LMP recorded. Patient has had a hysterectomy.. Negative for Dysmenorrhea. Negative for: breast discharge, breast lump(s), breast pain and breast self exam. Associated symptoms include abnormal vaginal bleeding. Pertinent negatives include abnormal bleeding (hematology), anxiety,  decreased libido, depression, difficulty falling sleep, dyspareunia, history of infertility, nocturia, sexual dysfunction, sleep disturbances, urinary incontinence, urinary urgency, vaginal discharge and vaginal itching. Diet regular.The patient states her exercise level is  intermittent.  . The patient's tobacco use is:  Social History   Tobacco Use  Smoking Status Never  Smokeless Tobacco Never  . She has been exposed to passive smoke. The patient's alcohol use is:  Social History   Substance and Sexual Activity  Alcohol Use No   Alcohol/week: 0.0 standard drinks of alcohol   Comment: occasional wine      Review of Systems  Constitutional: Negative.   HENT: Negative.    Eyes: Negative.  Negative for blurred vision.  Respiratory: Negative.    Cardiovascular: Negative.   Gastrointestinal: Negative.   Endocrine: Negative.  Negative for polydipsia, polyphagia and polyuria.  Genitourinary: Negative.   Musculoskeletal: Negative.   Skin: Negative.   Allergic/Immunologic: Negative.   Neurological: Negative.  Negative for headaches.  Hematological: Negative.    Psychiatric/Behavioral: Negative.       Today's Vitals   10/24/23 1502  BP: 110/70  Pulse: (!) 50  Temp: 98.5 F (36.9 C)  SpO2: 98%  Weight: 136 lb (61.7 kg)  Height: 5' 2 (1.575 m)   Body mass index is 24.87 kg/m.  Wt Readings from Last 3 Encounters:  10/24/23 136 lb (61.7 kg)  07/03/23 138 lb (62.6 kg)  07/03/23 138 lb 12.8 oz (63 kg)     Objective:  Physical Exam Vitals and nursing note reviewed.  Constitutional:      Appearance: Normal appearance.     Comments: Thin.  HENT:     Head: Normocephalic and atraumatic.     Right Ear: Tympanic membrane, ear canal and external ear normal.     Left Ear: Tympanic membrane, ear canal and external ear normal.     Nose: Nose normal.     Mouth/Throat:     Mouth: Mucous membranes are moist.     Pharynx: Oropharynx is clear.  Eyes:     Extraocular Movements: Extraocular movements intact.     Conjunctiva/sclera: Conjunctivae normal.     Pupils: Pupils are equal, round, and reactive to light.  Cardiovascular:     Rate and Rhythm: Normal rate and regular rhythm.     Pulses: Normal pulses.          Dorsalis pedis pulses are 2+ on the right side and 2+ on the left side.     Heart sounds: Murmur heard.  Pulmonary:     Effort: Pulmonary effort is normal.     Breath sounds: Normal breath sounds.  Chest:  Breasts:    Tanner Score is 5.     Right: Normal.     Left: Normal.  Abdominal:     General: Abdomen is flat. Bowel sounds are normal.     Palpations: Abdomen is soft.  Genitourinary:    Comments: deferred Musculoskeletal:        General: Normal range of motion.     Cervical back: Normal range of motion and neck supple.  Feet:     Right foot:     Protective Sensation: 5 sites tested.  5 sites sensed.     Skin integrity: Dry skin present.     Toenail Condition: Right toenails are normal.     Left foot:     Protective Sensation: 5 sites tested.  5 sites sensed.     Skin integrity: Dry skin present.  Toenail  Condition: Left toenails are normal.  Skin:    General: Skin is warm and dry.     Comments: Scab in middle of back, no surrounding erythema. No vesicular lesions noted  Neurological:     General: No focal deficit present.     Mental Status: She is alert and oriented to person, place, and time.  Psychiatric:        Mood and Affect: Mood normal.        Behavior: Behavior normal.      Assessment And Plan:     Encounter for general adult medical examination w/o abnormal findings Assessment & Plan: A full exam was performed.  Importance of monthly self breast exams was discussed with the patient.  She is advised to get 30-45 minutes of regular exercise, no less than four to five days per week. Both weight-bearing and aerobic exercises are recommended.  She is advised to follow a healthy diet with at least six fruits/veggies per day, decrease intake of red meat and other saturated fats and to increase fish intake to twice weekly.  Meats/fish should not be fried -- baked, boiled or broiled is preferable. It is also important to cut back on your sugar intake.  Be sure to read labels - try to avoid anything with added sugar, high fructose corn syrup or other sweeteners.  If you must use a sweetener, you can try stevia or monkfruit.  It is also important to avoid artificially sweetened foods/beverages and diet drinks. Lastly, wear SPF 50 sunscreen on exposed skin and when in direct sunlight for an extended period of time.  Be sure to avoid fast food restaurants and aim for at least 60 ounces of water daily.       Type 2 diabetes mellitus with stage 3a chronic kidney disease, without long-term current use of insulin  (HCC) Assessment & Plan: Chronic, diabetic foot exam was performed.  Blood glucose levels stable. Hemoglobin A1c at 5.7% indicates good control. Discussed malnutrition risk with Ozempic . - Continue Ozempic  weekly, importance of adequate protein intake and strength training was discussed with  the patient - Monitor blood glucose levels regularly. - Reassess diabetes management in 4 months - Chronic, she is encouraged to keep BP well controlled, avoid NSAIDs and to stay well hydrated to decrease risk of CKD progression.    Orders: -     CBC -     CMP14+EGFR -     Hemoglobin A1c -     EKG 12-Lead -     Microalbumin / creatinine urine ratio -     POCT URINALYSIS DIP (CLINITEK)  Hypertensive heart and renal disease with renal failure, stage 1 through stage 4 or unspecified chronic kidney disease, without heart failure Assessment & Plan: Chronic, well controlled.  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  She will c/w furosemide  20mg  daily, hydralazine  10mg  po tid, amlodipine  2.5mg  daily and olmesartan  20mg  daily. She is also on Imdur  60mg  daily. She is encouraged to follow low sodium diet.    Orders: -     CMP14+EGFR -     EKG 12-Lead -     Microalbumin / creatinine urine ratio -     POCT URINALYSIS DIP (CLINITEK)  CAD in native artery Assessment & Plan: Chronic, she is also followed by Cardiology, Dr. Claudene.  Importance of medication/dietary compliance was discussed with the patient. She is on ASA, isosorbide  and ARB therapy.   She is encouraged to follow heart healthy lifestyle.  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 She will continue with Synthroid  88mcg M-F for now.   Orders: -     TSH  Renal lesion Assessment & Plan: CT scan reviewed, small kidney lesion suspected to be malignant.  Per patient. Urologist stated area is too small to remove. Will have f/u CT in six months.  - MRI abdomen has been ordered, pending   Statin myopathy   Return for 1 YEAR HM , 4 MONTH DM. Patient was given opportunity to ask questions. Patient verbalized understanding of the plan and was able to repeat key elements of the plan. All  questions were answered to their satisfaction.   I, Catherine LOISE Slocumb, MD, have reviewed all documentation for this visit. The documentation on 10/24/23 for the exam, diagnosis, procedures, and orders are all accurate and complete.

## 2023-10-25 LAB — MICROALBUMIN / CREATININE URINE RATIO
Creatinine, Urine: 81.5 mg/dL
Microalb/Creat Ratio: <4 mg/g{creat} (ref 0–29)
Microalbumin, Urine: <3 ug/mL

## 2023-10-25 LAB — CBC
Hematocrit: 34.1 % (ref 34.0–46.6)
Hemoglobin: 10.6 g/dL — ABNORMAL LOW (ref 11.1–15.9)
MCH: 29.1 pg (ref 26.6–33.0)
MCHC: 31.1 g/dL — ABNORMAL LOW (ref 31.5–35.7)
MCV: 94 fL (ref 79–97)
Platelets: 287 x10E3/uL (ref 150–450)
RBC: 3.64 x10E6/uL — ABNORMAL LOW (ref 3.77–5.28)
RDW: 12.8 % (ref 11.7–15.4)
WBC: 3.1 x10E3/uL — ABNORMAL LOW (ref 3.4–10.8)

## 2023-10-25 LAB — CMP14+EGFR
ALT: 15 IU/L (ref 0–32)
AST: 43 IU/L — ABNORMAL HIGH (ref 0–40)
Albumin: 4.4 g/dL (ref 3.8–4.8)
Alkaline Phosphatase: 55 IU/L (ref 44–121)
BUN/Creatinine Ratio: 11 — ABNORMAL LOW (ref 12–28)
BUN: 16 mg/dL (ref 8–27)
Bilirubin Total: 0.4 mg/dL (ref 0.0–1.2)
CO2: 20 mmol/L (ref 20–29)
Calcium: 10 mg/dL (ref 8.7–10.3)
Chloride: 103 mmol/L (ref 96–106)
Creatinine, Ser: 1.4 mg/dL — ABNORMAL HIGH (ref 0.57–1.00)
Globulin, Total: 1.9 g/dL (ref 1.5–4.5)
Glucose: 83 mg/dL (ref 70–99)
Potassium: 4.5 mmol/L (ref 3.5–5.2)
Sodium: 140 mmol/L (ref 134–144)
Total Protein: 6.3 g/dL (ref 6.0–8.5)
eGFR: 39 mL/min/1.73 — ABNORMAL LOW (ref >59)

## 2023-10-25 LAB — TSH: TSH: 1.03 u[IU]/mL (ref 0.450–4.500)

## 2023-10-25 LAB — HEMOGLOBIN A1C
Est. average glucose Bld gHb Est-mCnc: 108 mg/dL
Hgb A1c MFr Bld: 5.4 % (ref 4.8–5.6)

## 2023-10-27 ENCOUNTER — Ambulatory Visit: Payer: Self-pay | Admitting: Internal Medicine

## 2023-10-30 DIAGNOSIS — N289 Disorder of kidney and ureter, unspecified: Secondary | ICD-10-CM | POA: Insufficient documentation

## 2023-10-30 NOTE — Assessment & Plan Note (Signed)
 Chronic, well controlled.  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  She will c/w furosemide  20mg  daily, hydralazine  10mg  po tid, amlodipine  2.5mg  daily and olmesartan  20mg  daily. She is also on Imdur  60mg  daily. She is encouraged to follow low sodium diet.

## 2023-10-30 NOTE — Assessment & Plan Note (Addendum)
 Chronic, she is also followed by Cardiology, Dr. Claudene.  Importance of medication/dietary compliance was discussed with the patient. She is on ASA, isosorbide  and ARB therapy.   She is encouraged to follow heart healthy lifestyle.  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

## 2023-10-30 NOTE — Assessment & Plan Note (Signed)

## 2023-10-30 NOTE — Assessment & Plan Note (Addendum)
 Chronic, diabetic foot exam was performed.  Blood glucose levels stable. Hemoglobin A1c at 5.7% indicates good control. Discussed malnutrition risk with Ozempic . - Continue Ozempic  weekly, importance of adequate protein intake and strength training was discussed with the patient - Monitor blood glucose levels regularly. - Reassess diabetes management in 4 months - Chronic, she is encouraged to keep BP well controlled, avoid NSAIDs and to stay well hydrated to decrease risk of CKD progression.

## 2023-10-30 NOTE — Assessment & Plan Note (Signed)
Chronic, I will check thyroid panel and adjust meds as needed. She will continue with Synthroid M-F for now.

## 2023-10-30 NOTE — Assessment & Plan Note (Addendum)
 CT scan reviewed, small kidney lesion suspected to be malignant.  Per patient. Urologist stated area is too small to remove. Will have f/u CT in six months.  - MRI abdomen has been ordered, pending

## 2023-11-20 DIAGNOSIS — I361 Nonrheumatic tricuspid (valve) insufficiency: Secondary | ICD-10-CM | POA: Diagnosis not present

## 2023-11-20 DIAGNOSIS — R072 Precordial pain: Secondary | ICD-10-CM | POA: Diagnosis not present

## 2023-11-20 DIAGNOSIS — I2510 Atherosclerotic heart disease of native coronary artery without angina pectoris: Secondary | ICD-10-CM | POA: Diagnosis not present

## 2023-11-24 ENCOUNTER — Other Ambulatory Visit: Payer: Self-pay | Admitting: Internal Medicine

## 2023-12-19 ENCOUNTER — Other Ambulatory Visit: Payer: Self-pay | Admitting: Internal Medicine

## 2023-12-20 ENCOUNTER — Other Ambulatory Visit: Payer: Self-pay | Admitting: Internal Medicine

## 2023-12-20 DIAGNOSIS — I739 Peripheral vascular disease, unspecified: Secondary | ICD-10-CM

## 2024-01-21 DIAGNOSIS — E119 Type 2 diabetes mellitus without complications: Secondary | ICD-10-CM | POA: Diagnosis not present

## 2024-01-21 DIAGNOSIS — H401131 Primary open-angle glaucoma, bilateral, mild stage: Secondary | ICD-10-CM | POA: Diagnosis not present

## 2024-01-21 DIAGNOSIS — H16223 Keratoconjunctivitis sicca, not specified as Sjogren's, bilateral: Secondary | ICD-10-CM | POA: Diagnosis not present

## 2024-01-21 DIAGNOSIS — H2513 Age-related nuclear cataract, bilateral: Secondary | ICD-10-CM | POA: Diagnosis not present

## 2024-02-13 ENCOUNTER — Ambulatory Visit: Payer: Self-pay | Admitting: Internal Medicine

## 2024-02-13 ENCOUNTER — Encounter: Payer: Self-pay | Admitting: Internal Medicine

## 2024-02-13 VITALS — BP 130/76 | HR 58 | Temp 98.3°F | Ht 62.0 in | Wt 143.0 lb

## 2024-02-13 DIAGNOSIS — N2889 Other specified disorders of kidney and ureter: Secondary | ICD-10-CM

## 2024-02-13 DIAGNOSIS — I2510 Atherosclerotic heart disease of native coronary artery without angina pectoris: Secondary | ICD-10-CM

## 2024-02-13 DIAGNOSIS — E039 Hypothyroidism, unspecified: Secondary | ICD-10-CM

## 2024-02-13 DIAGNOSIS — I1310 Hypertensive heart and chronic kidney disease without heart failure, with stage 1 through stage 4 chronic kidney disease, or unspecified chronic kidney disease: Secondary | ICD-10-CM | POA: Diagnosis not present

## 2024-02-13 DIAGNOSIS — T466X5A Adverse effect of antihyperlipidemic and antiarteriosclerotic drugs, initial encounter: Secondary | ICD-10-CM

## 2024-02-13 DIAGNOSIS — E1122 Type 2 diabetes mellitus with diabetic chronic kidney disease: Secondary | ICD-10-CM | POA: Diagnosis not present

## 2024-02-13 DIAGNOSIS — D649 Anemia, unspecified: Secondary | ICD-10-CM

## 2024-02-13 DIAGNOSIS — N1831 Chronic kidney disease, stage 3a: Secondary | ICD-10-CM | POA: Diagnosis not present

## 2024-02-13 DIAGNOSIS — G720 Drug-induced myopathy: Secondary | ICD-10-CM

## 2024-02-13 DIAGNOSIS — K838 Other specified diseases of biliary tract: Secondary | ICD-10-CM

## 2024-02-13 NOTE — Progress Notes (Signed)
 I,[name], CMA,acting as a neurosurgeon for [name], MD.,have documented all relevant documentation on the behalf of [name], MD,as directed by  [name], MD while in the presence of [name], MD.  Subjective:  Patient ID: [name] , female    DOB: June 07, 1945 , 78 y.o.   MRN: [id number]  Chief Complaint  Patient presents with  . Diabetes    Patient presents today for diabetes, blood pressure and cholesterol follow up. She reports compliance with medications. Denies headache, chest pain & sob.  She states being out of Ozempic  for 2 months.   . Hypertension  . Hypothyroidism    HPI Discussed the use of AI scribe software for clinical note transcription with the patient, who gave verbal consent to proceed.  History of Present Illness [name] is a 78 year old female with diabetes and hypertension who presents for a diabetes and blood pressure check.  Her blood sugar levels have been fluctuating, with recent readings around 130s and occasionally reaching 313. She attributes some of the fluctuations to dietary choices, including consuming ice cream, Cheetos, popcorn, potato chips, and Snickers brought by Lamar, who does the grocery shopping. She did not bring her glucose log to the appointment.  She has not received her Ozempic  for about a month and a half due to communication issues with the pharmacy or patient assistance program. She is currently taking Pletal , Repatha , olmesartan , and nitroglycerin  as needed. She also takes levothyroxine  88 mcg daily except on Saturdays and Sundays. She is taking Flintstones vitamins with iron due to a history of anemia.  She discusses a billing issue related to a missed appointment for a bone density test, which she claims she attended. She also mentions having undergone two CT scans and an MRI was ordered by her urologist but could not be completed due to a stimulator implant.   Her  appetite has increased recently, and she has gained seven pounds since July, which she attributes to increased consumption of sweets and snacks. No blood in stools and has been taking Flintstones vitamins with iron. She has a history of anemia and a slow heart rate, which she describes as her normal.  She has seen a urologist and a kidney specialist recently, with the urologist monitoring a spot on her kidney. She has not experienced any shortness of breath while walking around her house.   Diabetes She presents for her follow-up diabetic visit. She has type 2 diabetes mellitus. Pertinent negatives for hypoglycemia include no headaches. Pertinent negatives for diabetes include no blurred vision, no polydipsia, no polyphagia and no polyuria. There are no hypoglycemic complications. Diabetic complications include heart disease and nephropathy. Risk factors for coronary artery disease include diabetes mellitus, dyslipidemia, hypertension and post-menopausal. She is following a diabetic diet. She participates in exercise intermittently. An ACE inhibitor/angiotensin II receptor blocker is being taken. Eye exam is current.  Hypertension This is a chronic problem. The current episode started more than 1 year ago. The problem has been gradually improving since onset. The problem is controlled. Pertinent negatives include no blurred vision or headaches. The current treatment provides moderate improvement. 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     Past Medical History:  Diagnosis Date  . Benign hypertensive renal disease   . CKD stage 2 due to type 2 diabetes mellitus (HCC)   . Hypertension   . Hypothyroidism   . Obesity   . OSA  on CPAP   . Pure hypercholesterolemia   . PVD (peripheral vascular disease)   . Vitamin D deficiency      Family History  Problem Relation Age of Onset  . Alzheimer's disease Mother   . Aneurysm Father   . Cancer Brother      Current  Outpatient Medications:  .  ALPHAGAN  P 0.1 % SOLN, , Disp: , Rfl:  .  Apoaequorin (PREVAGEN PO), Take 1 tablet by mouth daily., Disp: , Rfl:  .  aspirin  EC 81 MG tablet, Take 81 mg by mouth daily., Disp: , Rfl:  .  b complex vitamins tablet, Take 1 tablet by mouth daily with lunch. , Disp: , Rfl:  .  Carboxymethylcellulose Sodium (ARTIFICIAL TEARS OP), Apply to eye., Disp: , Rfl:  .  CEQUA  0.09 % SOLN, Apply 1 drop to eye 2 (two) times daily., Disp: , Rfl:  .  cholecalciferol (VITAMIN D) 25 MCG (1000 UNIT) tablet, Take 1 tablet by mouth daily. , Disp: , Rfl:  .  cilostazol  (PLETAL ) 100 MG tablet, TAKE 1 TABLET(100 MG) BY MOUTH DAILY, Disp: 90 tablet, Rfl: 2 .  Evolocumab  (REPATHA  SURECLICK) 140 MG/ML SOAJ, Inject 140 mg into the skin every 14 (fourteen) days., Disp: 6 mL, Rfl: 3 .  furosemide  (LASIX ) 20 MG tablet, TAKE 1 TABLET BY MOUTH DAILY, Disp: 100 tablet, Rfl: 2 .  hydrALAZINE  (APRESOLINE ) 10 MG tablet, Take 1 tablet (10 mg total) by mouth 3 (three) times daily., Disp: 270 tablet, Rfl: 3 .  isosorbide  mononitrate (IMDUR ) 60 MG 24 hr tablet, TAKE 1 TABLET BY MOUTH DAILY, Disp: 90 tablet, Rfl: 3 .  levothyroxine  (SYNTHROID ) 88 MCG tablet, TAKE 1 TABLET BY MOUTH DAILY  BEFORE BREAKFAST, Disp: 90 tablet, Rfl: 3 .  Multiple Vitamins-Minerals (MULTIVITAMIN WITH MINERALS) tablet, Take 1 tablet by mouth daily with lunch. , Disp: , Rfl:  .  nitroGLYCERIN  (NITROSTAT ) 0.4 MG SL tablet, PLACE 1 TABLET UNDER THE TONGUE EVERY 5 MINUTES X 3 DOSES AS NEEDED FOR CHEST PAIN., Disp: 100 tablet, Rfl: 1 .  olmesartan  (BENICAR ) 20 MG tablet, TAKE 1 TABLET BY MOUTH DAILY, Disp: 90 tablet, Rfl: 3 .  Polyethyl Glycol-Propyl Glycol (SYSTANE OP), Apply to eye., Disp: , Rfl:  .  potassium chloride  (KLOR-CON ) 10 MEQ tablet, TAKE 1 TABLET BY MOUTH TWICE  DAILY WITH BREAKFAST AND LUNCH, Disp: 180 tablet, Rfl: 3 .  Semaglutide ,0.25 or 0.5MG /DOS, (OZEMPIC , 0.25 OR 0.5 MG/DOSE,) 2 MG/3ML SOPN, Inject 0.5mg  sq once weekly,  Disp: 3 mL, Rfl: 2 .  Blood Glucose Monitoring Suppl (ONETOUCH VERIO FLEX SYSTEM) w/Device KIT, USE AS DIRECTED TO CHECK BLOOD GLUCOSE 3 TIMES A DAY., Disp: 1 kit, Rfl: 0 .  glucose blood test strip, Use as instructed, Disp: 100 each, Rfl: 12   Allergies  Allergen Reactions  . Atorvastatin Swelling and Other (See Comments)    Limbs swell  . Clopidogrel  Other (See Comments)    Caused bruising  . Metoprolol  Other (See Comments)    Slows heart rate too low  . Metoprolol  Tartrate Swelling and Other (See Comments)    Site of swelling not noted  . Pregabalin Swelling and Other (See Comments)    Site of swelling not noted  . Rosuvastatin  Calcium  Other (See Comments)    Unknown reaction     Review of Systems  Constitutional: Negative.   Eyes:  Negative for blurred vision.  Respiratory: Negative.    Cardiovascular: Negative.   Gastrointestinal: Negative.   Endocrine: Negative for polydipsia,  polyphagia and polyuria.  Neurological: Negative.  Negative for headaches.  Psychiatric/Behavioral: Negative.       Today's Vitals   02/13/24 1426  BP: 130/76  Pulse: (!) 58  Temp: 98.3 F (36.8 C)  SpO2: 98%  Weight: 143 lb (64.9 kg)  Height: 5' 2 (1.575 m)   Body mass index is 26.16 kg/m.  Wt Readings from Last 3 Encounters:  02/13/24 143 lb (64.9 kg)  10/24/23 136 lb (61.7 kg)  07/03/23 138 lb (62.6 kg)     Objective:  Physical Exam Vitals and nursing note reviewed.  Constitutional:      Appearance: Normal appearance.  HENT:     Head: Normocephalic and atraumatic.  Eyes:     Extraocular Movements: Extraocular movements intact.  Cardiovascular:     Rate and Rhythm: Normal rate and regular rhythm.     Heart sounds: Normal heart sounds.  Pulmonary:     Effort: Pulmonary effort is normal.     Breath sounds: Normal breath sounds.  Musculoskeletal:     Cervical back: Normal range of motion.  Skin:    General: Skin is warm.  Neurological:     General: No focal deficit  present.     Mental Status: She is alert.  Psychiatric:        Mood and Affect: Mood normal.        Behavior: Behavior normal.         Assessment And Plan:  Type 2 diabetes mellitus with stage 3a chronic kidney disease, without long-term current use of insulin  (HCC) Assessment & Plan: Blood glucose levels fluctuate between 130s and 140s. No recent Ozempic  use due to supply issues. Chronic kidney disease present. - Contact pharmacist to resolve Ozempic  supply issue. - Provided sample of Ozempic  to start at 0.25 mg. - Ordered liver and kidney function tests.  Orders: -     CMP14+EGFR -     Hemoglobin A1c  Hypertensive heart and renal disease with renal failure, stage 1 through stage 4 or unspecified chronic kidney disease, without heart failure Assessment & Plan: Chronic, well controlled.  She will c/w furosemide  20mg  daily, hydralazine  10mg  po tid, and olmesartan  20mg  daily. She is also on Imdur  60mg  daily. She is encouraged to follow low sodium diet.  - Amlodipine  previously discontinued by another provider.    Orders: -     CMP14+EGFR  CAD in native artery Assessment & Plan: Chronic, she is also followed by Cardiology, Dr. Claudene.  Importance of medication/dietary compliance was discussed with the patient. She is on ASA, isosorbide  and ARB therapy.   She is encouraged to follow heart healthy lifestyle.  - She is also on Repatha  due to statin myopathy   Primary hypothyroidism Assessment & Plan: Managed with levothyroxine  88 mcg daily, Monday through Friday. - Continue levothyroxine  88 mcg daily, Monday through Friday. - Check thyroid  levels today  Orders: -     TSH  Renal mass Assessment & Plan: Indeterminate 1.9 cm hypoechoic lesion lower pole left kidney seen on August 2024 ultrasound. She is now followed by Urology. Advised by Urology that spot is too small for surgical intervention.  - Unable to perform MRI due to implant - Encouraged to have regular follow up  with Urology - Consult referral coordinator to determine why CT scan was not completed.   Common bile duct dilation  Anemia, unspecified type Assessment & Plan: Chronic anemia with no recent blood in stools. Iron supplementation with Flintstones vitamins. Previous attempts to obtain iron  supplements through patient assistance were unsuccessful. - Continue Flintstones vitamins with iron. - Ordered stool test to evaluate for gastrointestinal bleeding.  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  - Currently on Repatha .     Return for 4 month dm f/u.SABRA  Patient was given opportunity to ask questions. Patient verbalized understanding of the plan and was able to repeat key elements of the plan. All questions were answered to their satisfaction.   I, [name], MD, have reviewed all documentation for this visit. The documentation on 02/13/24 for the exam, diagnosis, procedures, and orders are all accurate and complete.   IF YOU HAVE BEEN REFERRED TO A SPECIALIST, IT MAY TAKE 1-2 WEEKS TO SCHEDULE/PROCESS THE REFERRAL. IF YOU HAVE NOT HEARD FROM US /SPECIALIST IN TWO WEEKS, PLEASE GIVE US  A CALL AT [phone].   THE PATIENT IS ENCOURAGED TO PRACTICE SOCIAL DISTANCING DUE TO THE COVID-19 PANDEMIC.

## 2024-02-13 NOTE — Patient Instructions (Signed)

## 2024-02-14 ENCOUNTER — Telehealth: Payer: Self-pay

## 2024-02-14 ENCOUNTER — Other Ambulatory Visit: Payer: Self-pay | Admitting: Internal Medicine

## 2024-02-14 LAB — CMP14+EGFR
ALT: 19 IU/L (ref 0–32)
AST: 39 IU/L (ref 0–40)
Albumin: 4.6 g/dL (ref 3.8–4.8)
Alkaline Phosphatase: 61 IU/L (ref 49–135)
BUN/Creatinine Ratio: 15 (ref 12–28)
BUN: 16 mg/dL (ref 8–27)
Bilirubin Total: 0.2 mg/dL (ref 0.0–1.2)
CO2: 23 mmol/L (ref 20–29)
Calcium: 9.8 mg/dL (ref 8.7–10.3)
Chloride: 103 mmol/L (ref 96–106)
Creatinine, Ser: 1.1 mg/dL — ABNORMAL HIGH (ref 0.57–1.00)
Globulin, Total: 1.8 g/dL (ref 1.5–4.5)
Glucose: 123 mg/dL — ABNORMAL HIGH (ref 70–99)
Potassium: 4.3 mmol/L (ref 3.5–5.2)
Sodium: 140 mmol/L (ref 134–144)
Total Protein: 6.4 g/dL (ref 6.0–8.5)
eGFR: 51 mL/min/1.73 — ABNORMAL LOW (ref >59)

## 2024-02-14 LAB — HEMOGLOBIN A1C
Est. average glucose Bld gHb Est-mCnc: 126 mg/dL
Hgb A1c MFr Bld: 6 % — ABNORMAL HIGH (ref 4.8–5.6)

## 2024-02-14 LAB — TSH: TSH: 1.59 u[IU]/mL (ref 0.450–4.500)

## 2024-02-14 LAB — CBC
Hematocrit: 34.8 % (ref 34.0–46.6)
Hemoglobin: 11.2 g/dL (ref 11.1–15.9)
MCH: 30.8 pg (ref 26.6–33.0)
MCHC: 32.2 g/dL (ref 31.5–35.7)
MCV: 96 fL (ref 79–97)
Platelets: 250 x10E3/uL (ref 150–450)
RBC: 3.64 x10E6/uL — ABNORMAL LOW (ref 3.77–5.28)
RDW: 12.9 % (ref 11.7–15.4)
WBC: 4.5 x10E3/uL (ref 3.4–10.8)

## 2024-02-14 LAB — IRON,TIBC AND FERRITIN PANEL
Ferritin: 23 ng/mL (ref 15–150)
Iron Saturation: 47 % (ref 15–55)
Iron: 178 ug/dL — ABNORMAL HIGH (ref 27–139)
Total Iron Binding Capacity: 376 ug/dL (ref 250–450)
UIBC: 198 ug/dL (ref 118–369)

## 2024-02-14 NOTE — Telephone Encounter (Signed)
 Completed refill form for Ozempic and faxed to provider office for review and signature.

## 2024-02-14 NOTE — Telephone Encounter (Signed)
 Copied from CRM #[id number]. Topic: Clinical - Medication Refill >> Feb 14, 2024  5:04 PM Delon HERO wrote: Medication: AISHA SINKS Device One touch Verio Test strips  Has the patient contacted their pharmacy? Yes (Agent: If no, request that the patient contact the pharmacy for the refill. If patient does not wish to contact the pharmacy document the reason why and proceed with request.) (Agent: If yes, when and what did the pharmacy advise?)  This is the patient's preferred pharmacy:  [facility] ST[id number] [place], [place] - [address] AT [facility] [address] [place] [address] Phone: [phone] Fax: [phone]  [facility] - [place], [place] - [address] [place] [address] Phone: [phone] Fax: [phone]    Is this the correct pharmacy for this prescription? Yes If no, delete pharmacy and type the correct one.   Has the prescription been filled recently? Yes  Is the patient out of the medication? Yes  Has the patient been seen for an appointment in the last year OR does the patient have an upcoming appointment? Yes  Can we respond through MyChart? Yes  Agent: Please be advised that Rx refills may take up to 3 business days. We ask that you follow-up with your pharmacy.

## 2024-02-16 ENCOUNTER — Ambulatory Visit: Payer: Self-pay | Admitting: Internal Medicine

## 2024-02-17 MED ORDER — ONETOUCH VERIO FLEX SYSTEM W/DEVICE KIT: PACK | 0 refills | Status: AC

## 2024-02-17 MED ORDER — GLUCOSE BLOOD VI STRP: ORAL_STRIP | 12 refills | Status: AC

## 2024-02-19 ENCOUNTER — Encounter: Payer: Self-pay | Admitting: Pharmacist

## 2024-02-19 NOTE — Progress Notes (Signed)
   02/19/2024  Patient ID: [name], female   DOB: 07-19-1945, 78 y.o.   MRN: [id number]   Refill/reorder/Change request form was completed and Provider signature obtained for Ozempic .  Form was faxed to Luke Mall, CPhT for scanning and processing.   Lab Results  Component Value Date   HGBA1C 6.0 (H) 02/13/2024   HGBA1C 5.4 10/24/2023   HGBA1C 5.7 (H) 05/27/2023    Statin intolerance. Patient is on Repatha .   On ARB: Olmesartan  20 mg 1 tablet daily filled 11/18/23 90 day supply    Cassius DOROTHA Brought, PharmD, [facility] Clinical Pharmacist [phone]

## 2024-02-21 NOTE — Assessment & Plan Note (Signed)
 Blood glucose levels fluctuate between 130s and 140s. No recent Ozempic  use due to supply issues. Chronic kidney disease present. - Contact pharmacist to resolve Ozempic  supply issue. - Provided sample of Ozempic  to start at 0.25 mg. - Ordered liver and kidney function tests.

## 2024-02-21 NOTE — Assessment & Plan Note (Signed)
 Chronic, well controlled.  She will c/w furosemide  20mg  daily, hydralazine  10mg  po tid, and olmesartan  20mg  daily. She is also on Imdur  60mg  daily. She is encouraged to follow low sodium diet.  - Amlodipine  previously discontinued by another provider.

## 2024-02-21 NOTE — Assessment & Plan Note (Signed)
 Managed with levothyroxine  88 mcg daily, Monday through Friday. - Continue levothyroxine  88 mcg daily, Monday through Friday. - Check thyroid  levels today

## 2024-02-21 NOTE — Assessment & Plan Note (Signed)
 Chronic, she is also followed by Cardiology, Dr. Claudene.  Importance of medication/dietary compliance was discussed with the patient. She is on ASA, isosorbide  and ARB therapy.   She is encouraged to follow heart healthy lifestyle.  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

## 2024-02-21 NOTE — Assessment & Plan Note (Signed)
 Chronic, unable to tolerate multiple statins.  - Currently on Repatha .

## 2024-02-21 NOTE — Assessment & Plan Note (Signed)
 Chronic anemia with no recent blood in stools. Iron supplementation with Flintstones vitamins. Previous attempts to obtain iron supplements through patient assistance were unsuccessful. - Continue Flintstones vitamins with iron. - Ordered stool test to evaluate for gastrointestinal bleeding.

## 2024-02-21 NOTE — Assessment & Plan Note (Signed)
 Indeterminate 1.9 cm hypoechoic lesion lower pole left kidney seen on August 2024 ultrasound. She is now followed by Urology. Advised by Urology that spot is too small for surgical intervention.  - Unable to perform MRI due to implant - Encouraged to have regular follow up with Urology - Consult referral coordinator to determine why CT scan was not completed.

## 2024-02-22 DIAGNOSIS — K838 Other specified diseases of biliary tract: Secondary | ICD-10-CM | POA: Insufficient documentation

## 2024-02-22 NOTE — Assessment & Plan Note (Addendum)
 Seen on recent CT abdomen/pelvis. It is suggested to move forward with MRCP if biochemical evaluation is positive.

## 2024-03-04 ENCOUNTER — Telehealth: Payer: Self-pay | Admitting: Pharmacist

## 2024-03-04 DIAGNOSIS — N1831 Chronic kidney disease, stage 3a: Secondary | ICD-10-CM

## 2024-03-04 NOTE — Progress Notes (Signed)
° °  03/04/2024 Name: [name] MRN: [id number] DOB: [birth year]  Chief Complaint  Patient presents with   Medication Assistance    Ozempic      An application and letter were delivered to the office from [employer] stating that the Patient's section was missing from her Ozempic  application. A refill-reorder form was sent to [employer] on the Patient's behalf for Ozempic  02/19/24.  A full application was not completed because it thought the patient needed refills.    [employer] Patient Assistance Program was called. The Representative said the Patient had not been approved to receive Ozempic  since 03/25/24.  She also said her last shipment was August of 2024.  Patient must not have had a re-enrollment form completed for her from last year.    Ozempic  is no longer available through [employer]'s program.  Plan: Will send Patient a [employer].  Cassius DOROTHA Brought, PharmD, BCACP Clinical Pharmacist [phone]

## 2024-03-09 ENCOUNTER — Telehealth: Payer: Self-pay

## 2024-03-09 NOTE — Telephone Encounter (Signed)
 PAP: Patient assistance application for Ozempic  through [employer] has been mailed to pt's home address on file. Provider portion of application will be faxed to provider's office. Patient portion e-filed. Refill order sent in for ozempic  in November.  The current application had expired for 2025.

## 2024-03-21 ENCOUNTER — Other Ambulatory Visit: Payer: Self-pay | Admitting: Internal Medicine

## 2024-03-24 ENCOUNTER — Other Ambulatory Visit: Payer: Self-pay | Admitting: Internal Medicine

## 2024-03-24 DIAGNOSIS — I739 Peripheral vascular disease, unspecified: Secondary | ICD-10-CM

## 2024-04-06 ENCOUNTER — Other Ambulatory Visit: Payer: Self-pay | Admitting: Internal Medicine

## 2024-04-06 DIAGNOSIS — Z1231 Encounter for screening mammogram for malignant neoplasm of breast: Secondary | ICD-10-CM

## 2024-04-28 ENCOUNTER — Other Ambulatory Visit: Payer: Self-pay

## 2024-04-28 DIAGNOSIS — E1122 Type 2 diabetes mellitus with diabetic chronic kidney disease: Secondary | ICD-10-CM

## 2024-04-28 MED ORDER — ACCU-CHEK GUIDE ME W/DEVICE KIT: PACK | 0 refills | Status: AC

## 2024-04-30 ENCOUNTER — Other Ambulatory Visit: Payer: Self-pay

## 2024-04-30 DIAGNOSIS — N1831 Chronic kidney disease, stage 3a: Secondary | ICD-10-CM

## 2024-04-30 MED ORDER — ACCU-CHEK GUIDE W/DEVICE KIT: PACK | 1 refills | Status: AC

## 2024-05-04 ENCOUNTER — Ambulatory Visit

## 2024-06-11 ENCOUNTER — Ambulatory Visit: Admitting: Internal Medicine

## 2024-08-05 ENCOUNTER — Ambulatory Visit: Payer: Self-pay

## 2024-08-05 ENCOUNTER — Ambulatory Visit: Payer: Self-pay | Admitting: Internal Medicine

## 2024-10-26 ENCOUNTER — Encounter: Payer: Self-pay | Admitting: Internal Medicine
